# Patient Record
Sex: Male | Born: 1955
Health system: Southern US, Community
[De-identification: ages and names within clinical notes are randomized; demographics above are authoritative.]

## PROBLEM LIST (undated history)

## (undated) DIAGNOSIS — F319 Bipolar disorder, unspecified: Secondary | ICD-10-CM

## (undated) DIAGNOSIS — N400 Enlarged prostate without lower urinary tract symptoms: Secondary | ICD-10-CM

## (undated) DIAGNOSIS — I080 Rheumatic disorders of both mitral and aortic valves: Secondary | ICD-10-CM

## (undated) DIAGNOSIS — I2510 Atherosclerotic heart disease of native coronary artery without angina pectoris: Secondary | ICD-10-CM

## (undated) DIAGNOSIS — I219 Acute myocardial infarction, unspecified: Secondary | ICD-10-CM

## (undated) DIAGNOSIS — F32A Depression, unspecified: Secondary | ICD-10-CM

## (undated) DIAGNOSIS — I341 Nonrheumatic mitral (valve) prolapse: Secondary | ICD-10-CM

## (undated) DIAGNOSIS — E119 Type 2 diabetes mellitus without complications: Secondary | ICD-10-CM

## (undated) DIAGNOSIS — G4733 Obstructive sleep apnea (adult) (pediatric): Principal | ICD-10-CM

## (undated) DIAGNOSIS — F419 Anxiety disorder, unspecified: Secondary | ICD-10-CM

## (undated) DIAGNOSIS — G4730 Sleep apnea, unspecified: Secondary | ICD-10-CM

## (undated) DIAGNOSIS — S065X9A Traumatic subdural hemorrhage with loss of consciousness of unspecified duration, initial encounter: Secondary | ICD-10-CM

## (undated) DIAGNOSIS — E785 Hyperlipidemia, unspecified: Secondary | ICD-10-CM

## (undated) DIAGNOSIS — F329 Major depressive disorder, single episode, unspecified: Secondary | ICD-10-CM

## (undated) DIAGNOSIS — S065XAA Traumatic subdural hemorrhage with loss of consciousness status unknown, initial encounter: Secondary | ICD-10-CM

## (undated) HISTORY — DX: Depression, unspecified: F32.A

## (undated) HISTORY — PX: COLONOSCOPY: SHX174

## (undated) HISTORY — DX: Benign prostatic hyperplasia without lower urinary tract symptoms: N40.0

## (undated) HISTORY — DX: Acute myocardial infarction, unspecified: I21.9

## (undated) HISTORY — DX: Nonrheumatic mitral (valve) prolapse: I34.1

## (undated) HISTORY — DX: Obstructive sleep apnea (adult) (pediatric): G47.33

## (undated) HISTORY — DX: Anxiety disorder, unspecified: F41.9

## (undated) HISTORY — DX: Type 2 diabetes mellitus without complications: E11.9

## (undated) HISTORY — DX: Traumatic subdural hemorrhage with loss of consciousness status unknown, initial encounter: S06.5XAA

## (undated) HISTORY — DX: Rheumatic disorders of both mitral and aortic valves: I08.0

## (undated) HISTORY — DX: Hyperlipidemia, unspecified: E78.5

## (undated) HISTORY — DX: Bipolar disorder, unspecified: F31.9

## (undated) HISTORY — DX: Traumatic subdural hemorrhage with loss of consciousness of unspecified duration, initial encounter: S06.5X9A

## (undated) HISTORY — PX: POLYPECTOMY: SHX149

## (undated) HISTORY — DX: Major depressive disorder, single episode, unspecified: F32.9

## (undated) HISTORY — DX: Sleep apnea, unspecified: G47.30

---

## 2003-06-14 ENCOUNTER — Encounter: Payer: Self-pay | Admitting: Cardiology

## 2003-06-14 ENCOUNTER — Encounter: Payer: Self-pay | Admitting: *Deleted

## 2003-06-14 ENCOUNTER — Inpatient Hospital Stay (HOSPITAL_COMMUNITY): Admission: EM | Admit: 2003-06-14 | Discharge: 2003-06-17 | Payer: Self-pay | Admitting: *Deleted

## 2004-02-07 ENCOUNTER — Ambulatory Visit (HOSPITAL_COMMUNITY): Admission: RE | Admit: 2004-02-07 | Discharge: 2004-02-07 | Payer: Self-pay | Admitting: Gastroenterology

## 2005-01-10 ENCOUNTER — Ambulatory Visit: Payer: Self-pay | Admitting: Cardiology

## 2005-01-13 ENCOUNTER — Ambulatory Visit: Payer: Self-pay | Admitting: Cardiology

## 2005-02-27 ENCOUNTER — Ambulatory Visit: Payer: Self-pay | Admitting: Cardiology

## 2005-06-23 ENCOUNTER — Ambulatory Visit: Payer: Self-pay | Admitting: Cardiology

## 2005-10-27 ENCOUNTER — Ambulatory Visit: Payer: Self-pay | Admitting: Cardiology

## 2006-01-30 ENCOUNTER — Ambulatory Visit: Payer: Self-pay | Admitting: Cardiology

## 2006-02-04 ENCOUNTER — Ambulatory Visit: Payer: Self-pay | Admitting: Cardiology

## 2006-08-18 ENCOUNTER — Inpatient Hospital Stay (HOSPITAL_COMMUNITY): Admission: EM | Admit: 2006-08-18 | Discharge: 2006-08-19 | Payer: Self-pay | Admitting: Emergency Medicine

## 2006-08-18 ENCOUNTER — Ambulatory Visit: Payer: Self-pay | Admitting: Cardiology

## 2006-08-19 ENCOUNTER — Ambulatory Visit: Payer: Self-pay | Admitting: Cardiology

## 2006-08-19 ENCOUNTER — Ambulatory Visit: Payer: Self-pay

## 2006-09-10 ENCOUNTER — Ambulatory Visit: Payer: Self-pay | Admitting: Cardiology

## 2006-11-02 ENCOUNTER — Ambulatory Visit: Payer: Self-pay | Admitting: Cardiology

## 2007-04-21 ENCOUNTER — Ambulatory Visit: Payer: Self-pay | Admitting: Cardiology

## 2008-06-20 ENCOUNTER — Ambulatory Visit: Payer: Self-pay | Admitting: Cardiology

## 2008-06-20 LAB — CONVERTED CEMR LAB
AST: 31 units/L (ref 0–37)
Albumin: 4 g/dL (ref 3.5–5.2)
HDL: 32 mg/dL — ABNORMAL LOW (ref >39.0)
LDL Cholesterol: 85 mg/dL (ref 0–99)
Total Bilirubin: 0.8 mg/dL (ref 0.3–1.2)
Total CHOL/HDL Ratio: 4.3
Triglycerides: 112 mg/dL (ref 0–149)
VLDL: 22 mg/dL (ref 0–40)

## 2008-06-22 ENCOUNTER — Ambulatory Visit: Payer: Self-pay | Admitting: Cardiology

## 2009-07-13 ENCOUNTER — Encounter (INDEPENDENT_AMBULATORY_CARE_PROVIDER_SITE_OTHER): Payer: Self-pay | Admitting: *Deleted

## 2009-10-05 DIAGNOSIS — F17200 Nicotine dependence, unspecified, uncomplicated: Secondary | ICD-10-CM

## 2009-10-05 DIAGNOSIS — F319 Bipolar disorder, unspecified: Secondary | ICD-10-CM

## 2009-10-05 DIAGNOSIS — E7800 Pure hypercholesterolemia, unspecified: Secondary | ICD-10-CM

## 2009-10-05 DIAGNOSIS — I2510 Atherosclerotic heart disease of native coronary artery without angina pectoris: Secondary | ICD-10-CM

## 2009-10-05 DIAGNOSIS — I25118 Atherosclerotic heart disease of native coronary artery with other forms of angina pectoris: Secondary | ICD-10-CM | POA: Insufficient documentation

## 2009-10-05 DIAGNOSIS — F411 Generalized anxiety disorder: Secondary | ICD-10-CM | POA: Insufficient documentation

## 2009-10-05 HISTORY — DX: Atherosclerotic heart disease of native coronary artery without angina pectoris: I25.10

## 2009-10-05 HISTORY — DX: Bipolar disorder, unspecified: F31.9

## 2009-10-05 HISTORY — DX: Pure hypercholesterolemia, unspecified: E78.00

## 2009-10-10 ENCOUNTER — Ambulatory Visit: Payer: Self-pay | Admitting: Cardiology

## 2010-11-11 ENCOUNTER — Ambulatory Visit: Payer: Self-pay | Admitting: Cardiology

## 2010-11-14 ENCOUNTER — Telehealth (INDEPENDENT_AMBULATORY_CARE_PROVIDER_SITE_OTHER): Payer: Self-pay | Admitting: Radiology

## 2010-11-18 ENCOUNTER — Encounter: Payer: Self-pay | Admitting: *Deleted

## 2010-11-18 ENCOUNTER — Encounter (HOSPITAL_COMMUNITY)
Admission: RE | Admit: 2010-11-18 | Discharge: 2011-01-14 | Payer: Self-pay | Source: Home / Self Care | Attending: Cardiology | Admitting: Cardiology

## 2010-11-18 ENCOUNTER — Encounter: Payer: Self-pay | Admitting: Cardiovascular Disease

## 2010-11-18 ENCOUNTER — Ambulatory Visit: Payer: Self-pay | Admitting: Cardiology

## 2010-11-18 ENCOUNTER — Ambulatory Visit: Payer: Self-pay

## 2010-11-28 ENCOUNTER — Encounter: Payer: Self-pay | Admitting: Cardiology

## 2010-11-28 ENCOUNTER — Ambulatory Visit (HOSPITAL_COMMUNITY)
Admission: RE | Admit: 2010-11-28 | Discharge: 2010-11-28 | Payer: Self-pay | Source: Home / Self Care | Attending: Cardiology | Admitting: Cardiology

## 2010-11-28 ENCOUNTER — Ambulatory Visit: Payer: Self-pay

## 2010-12-03 ENCOUNTER — Ambulatory Visit: Payer: Self-pay | Admitting: Cardiology

## 2010-12-15 DIAGNOSIS — I219 Acute myocardial infarction, unspecified: Secondary | ICD-10-CM

## 2010-12-15 HISTORY — DX: Acute myocardial infarction, unspecified: I21.9

## 2011-01-06 ENCOUNTER — Ambulatory Visit
Admission: RE | Admit: 2011-01-06 | Discharge: 2011-01-06 | Payer: Self-pay | Source: Home / Self Care | Attending: Cardiology | Admitting: Cardiology

## 2011-01-06 ENCOUNTER — Other Ambulatory Visit: Payer: Self-pay | Admitting: Cardiology

## 2011-01-06 DIAGNOSIS — I059 Rheumatic mitral valve disease, unspecified: Secondary | ICD-10-CM

## 2011-01-06 HISTORY — DX: Rheumatic mitral valve disease, unspecified: I05.9

## 2011-01-06 LAB — HEPATIC FUNCTION PANEL
Alkaline Phosphatase: 36 U/L — ABNORMAL LOW (ref 39–117)
Bilirubin, Direct: 0.1 mg/dL (ref 0.0–0.3)
Total Bilirubin: 0.8 mg/dL (ref 0.3–1.2)

## 2011-01-06 LAB — LIPID PANEL
Cholesterol: 155 mg/dL (ref 0–200)
LDL Cholesterol: 92 mg/dL (ref 0–99)
Total CHOL/HDL Ratio: 4
VLDL: 21 mg/dL (ref 0.0–40.0)

## 2011-01-14 NOTE — Progress Notes (Signed)
Summary: nuc pre-procedure  Phone Note Outgoing Call   Call placed by: Harlow Asa CNMT Call placed to: Patient Reason for Call: Confirm/change Appt Summary of Call: Left message with information on Myoview Information Sheet (see scanned document for details).      Nuclear Med Background Indications for Stress Test: Evaluation for Ischemia, Stent Patency   History: Angioplasty, Heart Catheterization, Myocardial Perfusion Study, Stents  History Comments: '04 Cath/PTCA/Stents RCA ; '07 MPI Inf. scar /EF=52%  Symptoms: Chest Pain    Nuclear Pre-Procedure Cardiac Risk Factors: Family History - CAD, History of Smoking, Smoker Height (in): 69

## 2011-01-14 NOTE — Assessment & Plan Note (Signed)
Summary: f1y   Visit Type:  Follow-up  CC:  chest pain.  History of Present Illness: Had an episode of chest pain about one month ago.  Lasted about twenty minutes,and he had to lay down.  Took a nitro.  Had not had something like this since his MI.  Denies progressive chest pain since, and has been able to do most things.  Still smokes some.    Problems Prior to Update: 1)  Cad  (ICD-414.00) 2)  Hypercholesterolemia  (ICD-272.0) 3)  Bipolar Disorder Unspecified  (ICD-296.80) 4)  Anxiety Disorder  (ICD-300.00) 5)  Tobacco Abuse  (ICD-305.1)  Current Medications (verified): 1)  Lipitor 20 Mg Tabs (Atorvastatin Calcium) .... Take One Tablet By Mouth Daily. 2)  Metoprolol Tartrate 25 Mg Tabs (Metoprolol Tartrate) .... Take One Tablet By Mouth Twice A Day 3)  Bupropion Hcl 75 Mg Tabs (Bupropion Hcl) .... Take 3 Tablets Daily 4)  Aspirin 81 Mg Tbec (Aspirin) .... Take One Tablet By Mouth Daily 5)  Depakote 500 Mg Tbec (Divalproex Sodium) .... Take 4 Tablets A Day 6)  Lamictal 100 Mg Tabs (Lamotrigine) .... Take 1 Tablet By Mouth Once A Day 7)  Singulair 10 Mg Tabs (Montelukast Sodium) .... Take 1 Tablet By Mouth Once A Day 8)  Nitrostat 0.4 Mg Subl (Nitroglycerin) .[name].. 1 Tablet Under Tongue At Onset of Chest Pain; You May Repeat Every 5 Minutes For Up To 3 Doses. 9)  Tylenol Extra Strength 500 Mg Tabs (Acetaminophen) .... As Needed  Allergies (verified): No Known Drug Allergies  Past History:  Past Medical History: Last updated: 10/05/2009 Current Problems:  CAD (ICD-414.00) HYPERCHOLESTEROLEMIA (ICD-272.0) BIPOLAR DISORDER UNSPECIFIED (ICD-296.80) ANXIETY DISORDER (ICD-300.00) TOBACCO ABUSE (ICD-305.1)  Past Surgical History: Last updated: 10/05/2009  successful implantation of a  Cypher stent setting for an acute myocardial infarction.  Family History: Last updated: 10/05/2009   His mother is alive with coronary artery disease status  post several MIs.  Father passed  away in his 37s of an MI.  He has one brother who is alive status post 3-vessel CABG, one brother who passed away at the age of 36 of an acute MI.  Social History: Last updated: 10/05/2009  The patient is married.  He works at [employer].  He  smokes 2 cigarettes per day, former heavy tobacco user.  He drinks alcohol occasionally.  He denies any illicit drug use.  He exercises regularly.  Vital Signs:  Patient profile:   55 year old male Height:      69 inches Weight:      164 pounds BMI:     24.31 Pulse rate:   64 / minute Pulse rhythm:   regular BP sitting:   102 / 80  (left arm)  Vitals Entered By: Jacquelin Hawking, CMA (November 11, 2010 12:11 PM)  Physical Exam  General:  Well developed, well nourished, in no acute distress. Head:  normocephalic and atraumatic Eyes:  PERRLA/EOM intact; conjunctiva and lids normal. Lungs:  Clear bilaterally to auscultation and percussion. Heart:  PMI non displaced.  Normal S1 and S2.  No murmur, rub or gallop.   Msk:  Back normal, normal gait. Muscle strength and tone normal. Pulses:  pulses normal in all 4 extremities Extremities:  No clubbing or cyanosis. Neurologic:  Alert and oriented x 3.   EKG  Procedure date:  11/11/2010  Findings:      NSR.  WNL.  Cardiac Cath  Procedure date:  06/18/2003  Findings:  ANGIOGRAPHIC DATA:  1. Left main coronary artery was free of critical disease.  2. The LAD coursed to the apex.  There is mild luminal irregularity     including mild irregularity of the diagonal.  No high grade areas of     stenosis were noted.  There was faint collateralization of the distal     right coronary circulation.  3. The circumflex provided two major marginal branches.  The first marginal     branch had a segmental plaquing of about 50-60% in the proximal to mid     portion.  The second marginal branch was without critical disease.  4. The right coronary artery was a very large caliber vessel.  There  was a     90% focal stenosis in the mid vessel with some segmental plaquing     proximally.  More distally, especially seen in the RAO view was about a     60-70% area of narrowing prior to the distal vessel.  Following stenting     all of this was reduced to less than 10% residual luminal narrowing.     There was mild luminal irregularity in the distal vessel.  The PDA and     posterolateral system were free of critical disease.   VENTRICULOGRAPHY:  Ventriculography was performed in the RAO projection.  Ejection fraction was approximately 55% with an inferior wall area of  hypokinesis.  There also appears to be mild mitral regurgitation, although  this is difficult to gauge because most of those were in post PVC beats.   Impression & Recommendations:  Problem # 1:  CAD (ICD-414.00)  Had recent episode of pain.  Not exertional, and not recurrent. Had residual disease, so needs further workup.  Will do stress nuclear study to assess, and renew NTG prescription.  ECG is normal.  His updated medication list for this problem includes:    Metoprolol Tartrate 25 Mg Tabs (Metoprolol tartrate) .[name]... Take one tablet by mouth twice a day    Aspirin 81 Mg Tbec (Aspirin) .[name]... Take one tablet by mouth daily    Nitrostat 0.4 Mg Subl (Nitroglycerin) .[name]... 1 tablet under tongue at onset of chest pain; you may repeat every 5 minutes for up to 3 doses.  Orders: EKG w/ Interpretation (93000) Nuclear Stress Test (Nuc Stress Test)  Problem # 2:  HYPERCHOLESTEROLEMIA (ICD-272.0)  on LLT. His updated medication list for this problem includes:    Lipitor 20 Mg Tabs (Atorvastatin calcium) .[name]... Take one tablet by mouth daily.  Orders: EKG w/ Interpretation (93000) Nuclear Stress Test (Nuc Stress Test)  Problem # 3:  ANXIETY DISORDER (ICD-300.00) slightly anxious about this.  Inhibits ability to dc smoking.   Patient Instructions: 1)  Your physician recommends that you schedule a follow-up  appointment in: 1 WEEK 2)  Your physician recommends that you continue on your current medications as directed. Please refer to the Current Medication list given to you today. 3)  Your physician has requested that you have an exercise stress myoview in 1 WEEK.  For further information please visit [email or web address]  Please follow instruction sheet, as given. Prescriptions: NITROSTAT 0.4 MG SUBL (NITROGLYCERIN) 1 tablet under tongue at onset of chest pain; you may repeat every 5 minutes for up to 3 doses.  #25 x 2   Entered by:   Julieta Gutting, RN, BSN   Authorized by:   Ronaldo Miyamoto, MD, [facility]   Signed by:   Julieta Gutting, RN, BSN on 11/11/2010  Method used:   Electronically to        [facility] [phone]* (retail)       [address], [place]  [address]       Ph: [phone] or [phone]       Fax: [phone]   RxID:   [id number]

## 2011-01-16 NOTE — Assessment & Plan Note (Signed)
Summary: 6wk f/u    Visit Type:  Follow-up  CC:  No complaints.  History of Present Illness: Patient is in for follow up.  No chest pain at present.  Continues to do well at present time.    Problems Prior to Update: 1)  Cad  (ICD-414.00) 2)  Hypercholesterolemia  (ICD-272.0) 3)  Bipolar Disorder Unspecified  (ICD-296.80) 4)  Anxiety Disorder  (ICD-300.00) 5)  Tobacco Abuse  (ICD-305.1)  Current Medications (verified): 1)  Lipitor 20 Mg Tabs (Atorvastatin Calcium) .... Take One Tablet By Mouth Daily. 2)  Metoprolol Tartrate 25 Mg Tabs (Metoprolol Tartrate) .... Take One Tablet By Mouth Twice A Day 3)  Bupropion Hcl 75 Mg Tabs (Bupropion Hcl) .... Take 3 Tablets Daily 4)  Aspirin 81 Mg Tbec (Aspirin) .... Take One Tablet By Mouth Daily 5)  Depakote 500 Mg Tbec (Divalproex Sodium) .... Take 4 Tablets A Day 6)  Lamictal 100 Mg Tabs (Lamotrigine) .... Take 1 Tablet By Mouth Once A Day 7)  Singulair 10 Mg Tabs (Montelukast Sodium) .... Take 1 Tablet By Mouth Once A Day 8)  Nitrostat 0.4 Mg Subl (Nitroglycerin) .[name].. 1 Tablet Under Tongue At Onset of Chest Pain; You May Repeat Every 5 Minutes For Up To 3 Doses. 9)  Tylenol Extra Strength 500 Mg Tabs (Acetaminophen) .... As Needed  Allergies (verified): No Known Drug Allergies  Past History:  Past Medical History: Last updated: 10/05/2009 Current Problems:  CAD (ICD-414.00) HYPERCHOLESTEROLEMIA (ICD-272.0) BIPOLAR DISORDER UNSPECIFIED (ICD-296.80) ANXIETY DISORDER (ICD-300.00) TOBACCO ABUSE (ICD-305.1)  Past Surgical History: Last updated: 10/05/2009  successful implantation of a  Cypher stent setting for an acute myocardial infarction.  Family History: Last updated: 10/05/2009   His mother is alive with coronary artery disease status  post several MIs.  Father passed away in his 57s of an MI.  He has one brother who is alive status post 3-vessel CABG, one brother who passed away at the age of 75 of an acute MI.  Social  History: Last updated: 10/05/2009  The patient is married.  He works at [employer].  He  smokes 2 cigarettes per day, former heavy tobacco user.  He drinks alcohol occasionally.  He denies any illicit drug use.  He exercises regularly.  Vital Signs:  Patient profile:   56 year old male Height:      69 inches Weight:      163.50 pounds BMI:     24.23 Pulse rate:   72 / minute Pulse rhythm:   regular Resp:     18 per minute BP sitting:   108 / 80  (left arm) Cuff size:   large  Vitals Entered By: Vikki Ports (January 06, 2011 12:30 PM)  Physical Exam  General:  Well developed, well nourished, in no acute distress. Head:  normocephalic and atraumatic Eyes:  PERRLA/EOM intact; conjunctiva and lids normal. Lungs:  Clear bilaterally to auscultation and percussion. Heart:  PMI non displaced.  Normal S1 and S2.  No murmur heard. Extremities:  No clubbing or cyanosis. Neurologic:  Alert and oriented x 3.   Echocardiogram  Procedure date:  11/28/2010  Findings:      Study Conclusions            - Left ventricle: The cavity size was normal. Wall thickness was       increased in a pattern of mild LVH. Systolic function was normal.       The estimated ejection fraction was in the range of 55%  to 60%.       Wall motion was normal; there were no regional wall motion       abnormalities. Left ventricular diastolic function parameters were       normal.     - Mitral valve: Moderate regurgitation.  Impression & Recommendations:  Problem # 1:  CAD (ICD-414.00) stable.  No chest pain.  Stable at present. His updated medication list for this problem includes:    Metoprolol Tartrate 25 Mg Tabs (Metoprolol tartrate) .[name]... Take one tablet by mouth twice a day    Aspirin 81 Mg Tbec (Aspirin) .[name]... Take one tablet by mouth daily    Nitrostat 0.4 Mg Subl (Nitroglycerin) .[name]... 1 tablet under tongue at onset of chest pain; you may repeat every 5 minutes for up to 3  doses.  Orders: TLB-Lipid Panel (80061-LIPID) TLB-Hepatic/Liver Function Pnl (80076-HEPATIC)  Problem # 2:  MITRAL STENOSIS/ INSUFFICIENCY, NON-RHEUMATIC (ICD-424.0) Cannot hear a murmur.  Study reveiwed with patient, including images.  Suggest repeat study in 12-18 months unless change in murmur.  His updated medication list for this problem includes:    Metoprolol Tartrate 25 Mg Tabs (Metoprolol tartrate) .[name]... Take one tablet by mouth twice a day    Nitrostat 0.4 Mg Subl (Nitroglycerin) .[name]... 1 tablet under tongue at onset of chest pain; you may repeat every 5 minutes for up to 3 doses.  Problem # 3:  HYPERCHOLESTEROLEMIA (ICD-272.0) Will check lipid and liver today.   His updated medication list for this problem includes:    Lipitor 20 Mg Tabs (Atorvastatin calcium) .[name]... Take one tablet by mouth daily.  Orders: TLB-Lipid Panel (80061-LIPID) TLB-Hepatic/Liver Function Pnl (80076-HEPATIC)  Patient Instructions: 1)  Your physician recommends that you have a FASTING lipid and liver profile  today.  2)  Your physician recommends that you continue on your current medications as directed. Please refer to the Current Medication list given to you today. 3)  Your physician wants you to follow-up in:  4 MONTHS.  You will receive a reminder letter in the mail two months in advance. If you don't receive a letter, please call our office to schedule the follow-up appointment.

## 2011-01-16 NOTE — Assessment & Plan Note (Signed)
Summary: PER CHECK OUT/ STRESS @ 8AM/SAF   Visit Type:  follow-up stress test done today   History of Present Illness: Patient is here to review results of his exercise nuclear study.  This revealed inferior defect that is scar, but with reduced overall EF that is in the 35-40% range.  This is perhaps more than noted at cath during the acute visit.  He has not had symptoms since we saw him last few weeks.  He had good execise capacity and virtually no chest pain whatsoever.  There were not significant ST segment changes.   Problems Prior to Update: 1)  Cad  (ICD-414.00) 2)  Hypercholesterolemia  (ICD-272.0) 3)  Bipolar Disorder Unspecified  (ICD-296.80) 4)  Anxiety Disorder  (ICD-300.00) 5)  Tobacco Abuse  (ICD-305.1)  Current Medications (verified): 1)  Lipitor 20 Mg Tabs (Atorvastatin Calcium) .... Take One Tablet By Mouth Daily. 2)  Metoprolol Tartrate 25 Mg Tabs (Metoprolol Tartrate) .... Take One Tablet By Mouth Twice A Day 3)  Bupropion Hcl 75 Mg Tabs (Bupropion Hcl) .... Take 3 Tablets Daily 4)  Aspirin 81 Mg Tbec (Aspirin) .... Take One Tablet By Mouth Daily 5)  Depakote 500 Mg Tbec (Divalproex Sodium) .... Take 4 Tablets A Day 6)  Lamictal 100 Mg Tabs (Lamotrigine) .... Take 1 Tablet By Mouth Once A Day 7)  Singulair 10 Mg Tabs (Montelukast Sodium) .... Take 1 Tablet By Mouth Once A Day 8)  Nitrostat 0.4 Mg Subl (Nitroglycerin) .[name].. 1 Tablet Under Tongue At Onset of Chest Pain; You May Repeat Every 5 Minutes For Up To 3 Doses. 9)  Tylenol Extra Strength 500 Mg Tabs (Acetaminophen) .... As Needed  Allergies (verified): No Known Drug Allergies  Vital Signs:  Patient profile:   55 year old male Height:      69 inches Weight:      165.75 pounds BMI:     24.57 Pulse rate:   68 / minute Pulse rhythm:   regular Resp:     18 per minute BP sitting:   114 / 80  (left arm) Cuff size:   large  Vitals Entered By: Vikki Ports (November 18, 2010 2:50 PM)  Physical  Exam  General:  Well developed, well nourished, in no acute distress. Head:  normocephalic and atraumatic Eyes:  PERRLA/EOM intact; conjunctiva and lids normal. Lungs:  Clear bilaterally to auscultation and percussion. Heart:  Unchanged.  Very soft apical murmur.  Not heard last visit.  Extremities:  No clubbing or cyanosis. Neurologic:  Alert and oriented x 3.   Impression & Recommendations:  Problem # 1:  CAD (ICD-414.00) Nuclear scan suggest more severely reduced LV function than at time of cath.   Will await offical report and let patient know.   His updated medication list for this problem includes:    Metoprolol Tartrate 25 Mg Tabs (Metoprolol tartrate) .[name]... Take one tablet by mouth twice a day    Aspirin 81 Mg Tbec (Aspirin) .[name]... Take one tablet by mouth daily    Nitrostat 0.4 Mg Subl (Nitroglycerin) .[name]... 1 tablet under tongue at onset of chest pain; you may repeat every 5 minutes for up to 3 doses.  Problem # 2:  HYPERCHOLESTEROLEMIA (ICD-272.0) on lipid lowering therapy.  His updated medication list for this problem includes:    Lipitor 20 Mg Tabs (Atorvastatin calcium) .[name]... Take one tablet by mouth daily.  Problem # 3:  TOBACCO ABUSE (ICD-305.1) encouraged to quit.  Patient Instructions: 1)  Your physician recommends  that you schedule a follow-up appointment in: 6 weeks 2)  Your physician recommends that you continue on your current medications as directed. Please refer to the Current Medication list given to you today.

## 2011-01-16 NOTE — Assessment & Plan Note (Addendum)
Summary: Cardiology Nuclear Testing  Nuclear Med Background Indications for Stress Test: Evaluation for Ischemia, Stent Patency   History: Angioplasty, Heart Catheterization, Myocardial Perfusion Study, Stents  History Comments: '04 Cath/PTCA/Stents RCA ; '07 MPI Inf. scar /EF=52%  Symptoms: Chest Pain, Chest Tightness, Palpitations    Nuclear Pre-Procedure Cardiac Risk Factors: Family History - CAD, History of Smoking, Smoker Caffeine/Decaff Intake: none NPO After: 9:00 PM Lungs: clear IV 0.9% NS with Angio Cath: 22g     IV Site: R Hand IV Started by: Cathlyn Parsons, RN Chest Size (in) 38     Height (in): 69 Weight (lb): 163 BMI: 24.16 Tech Comments: Metoprolol held x 24hrs.  Nuclear Med Study 1 or 2 day study:  1 day     Stress Test Type:  Stress Reading MD:  Charlton Haws, MD     Referring MD:  T.Stuckey Resting Radionuclide:  Technetium 63m Tetrofosmin     Resting Radionuclide Dose:  11 mCi  Stress Radionuclide:  Technetium 78m Tetrofosmin     Stress Radionuclide Dose:  33 mCi   Stress Protocol Exercise Time (min):  9:00 min     Max HR:  151 bpm     Predicted Max HR:  168 bpm  Max Systolic BP: 143 mm Hg     Percent Max HR:  89.88 %     METS: 10.10 Rate Pressure Product:  62952    Stress Test Technologist:  Milana Na, EMT-P     Nuclear Technologist:  Domenic Polite, CNMT  Rest Procedure  Myocardial perfusion imaging was performed at rest 45 minutes following the intravenous administration of Technetium 35m Tetrofosmin.  Stress Procedure  The patient exercised for 9:00. The patient stopped due to fatigue, sob, and denied any chest pain.  There were no significant ST-T wave changes and rare pvcs.  Technetium 96m Tetrofosmin was injected at peak exercise and myocardial perfusion imaging was performed after a brief delay.  QPS Raw Data Images:  Normal; no motion artifact; normal heart/lung ratio. Stress Images:  There is decreased uptake in the inferior  wall Rest Images:  There is decreased uptake in the inferior wall. Subtraction (SDS):  There is a fixed defect that is most consistent with a previous infarction. Transient Ischemic Dilatation:  1.08  (Normal <1.22)  Lung/Heart Ratio:  0.22  (Normal <0.45)  Quantitative Gated Spect Images QGS EDV:  138 ml QGS ESV:  86 ml QGS EF:  37 % QGS cine images:  Depressed LV systolic function with inferior wall hypokinesis.  Findings Low risk nuclear study  Evidence for inferior infarct  Evidence for LV Dysfunction LV Dysfunction    Overall Impression  Exercise Capacity: Good exercise capacity. BP Response: Blunted  BP response Clinical Symptoms: No chest pain.  Dyspnea. ECG Impression: No significant ST segment change suggestive of ischemia. Overall Impression: Low risk stress nuclear study. Overall Impression Comments: Fixed inferior wall scar without significant reversible ischemia.  LV dysfunction is present and since last cardiolite in 2007 the EF has decreased from 52% to 37%.  Appended Document: Cardiology Nuclear Testing Patient called regarding results of study.  Fixed defect inferiorly with major finding of reduced LV compared to last study.  Patient is currently asymptomatic.  In general, would lean toward cathing if findings are real.  However, would get echo to assess LV function more accurately.  If also reduced, then cath patient.  Explained to patient in detail.  He understands. Will set up echo for next week.  TS  Appended  Document: Orders Update I spoke with the pt and scheduled him to have an echocardiogram on 11/28/10 at 1:00.   Clinical Lists Changes  Orders: Added new Referral order of Echocardiogram (Echo) - Signed

## 2011-02-17 ENCOUNTER — Telehealth (INDEPENDENT_AMBULATORY_CARE_PROVIDER_SITE_OTHER): Payer: Self-pay | Admitting: *Deleted

## 2011-02-25 NOTE — Progress Notes (Signed)
  DDS Request Received sent to [facility]  February 17, 2011 10:04 AM

## 2011-03-03 ENCOUNTER — Other Ambulatory Visit: Payer: Self-pay

## 2011-03-05 ENCOUNTER — Ambulatory Visit (INDEPENDENT_AMBULATORY_CARE_PROVIDER_SITE_OTHER): Payer: BC Managed Care – PPO | Admitting: *Deleted

## 2011-03-05 DIAGNOSIS — I059 Rheumatic mitral valve disease, unspecified: Secondary | ICD-10-CM

## 2011-03-05 DIAGNOSIS — E7800 Pure hypercholesterolemia, unspecified: Secondary | ICD-10-CM

## 2011-03-05 DIAGNOSIS — I2510 Atherosclerotic heart disease of native coronary artery without angina pectoris: Secondary | ICD-10-CM

## 2011-03-05 LAB — LIPID PANEL
Cholesterol: 148 mg/dL (ref 0–200)
HDL: 37 mg/dL — ABNORMAL LOW (ref >39.00)
LDL Cholesterol: 96 mg/dL (ref 0–99)
Triglycerides: 73 mg/dL (ref 0.0–149.0)
VLDL: 14.6 mg/dL (ref 0.0–40.0)

## 2011-03-05 LAB — HEPATIC FUNCTION PANEL
ALT: 20 U/L (ref 0–53)
Albumin: 4.2 g/dL (ref 3.5–5.2)
Bilirubin, Direct: 0.1 mg/dL (ref 0.0–0.3)
Total Protein: 6.4 g/dL (ref 6.0–8.3)

## 2011-03-18 ENCOUNTER — Telehealth: Payer: Self-pay | Admitting: Cardiology

## 2011-03-18 NOTE — Telephone Encounter (Signed)
Pt aware of lipid and liver results by phone.  

## 2011-04-29 NOTE — Assessment & Plan Note (Signed)
[facility]                            CARDIOLOGY OFFICE NOTE   LUCILE, DIDONATO                         MRN:          [id number]  DATE:06/22/2008                            DOB:          1956-03-27    Mr. Achille is in for followup.  In general, he is getting along  reasonably well.  He has no complaints.  He smokes about 2 cigarettes a  day.  The patient previously underwent successful implantation of a  Cypher stent setting for an acute myocardial infarction.  He had a good  angiographic result, overall.  He had a followup radionuclide imaging  study in September 2007.  At that time, there was an old small scar at  the base of the inferior wall with no evidence of ischemia.   CURRENT MEDICATIONS:  1. Bupropion 75 mg b.i.d.  2. Metoprolol 25 mg b.i.d.  3. Lipitor 20 mg at bedtime.  4. Aspirin 81 mg daily.  5. Depakote 500 mg 3 tablets daily.  6. Lamictal 100 mg daily.  7. Singulair 10 mg daily.   Recent laboratory studies done 2 days earlier revealed total cholesterol  139 with an LDL of 85 and HDL of 32.   On physical, he is a well-appearing gentleman in no acute distress.  Blood pressure is 114/71 and pulse is 59.  The weight is 169 pounds.  The lung fields are clear to auscultation and percussion.  The  extremities reveal no edema.   Electrocardiogram demonstrates sinus bradycardia.  There is a prominent  voltage.  There are no definite inferior Qs.   IMPRESSION:  1. Coronary artery disease, status post percutaneous coronary      intervention with a drug-eluting stent in 2004 for right coronary      artery obstruction.  2. Continued mild tobacco use.  3. Hypercholesterolemia.   PLAN:  1. Return to clinic in 1 year.  2. Continue Lipitor current dose.  3. I strongly encouraged discontinuation of smoking.    Arturo Morton. Riley Kill, MD, [facility]  Electronically Signed   TDS/MedQ  DD: 07/29/2008  DT: 07/30/2008  Job #: [id number]

## 2011-05-02 NOTE — Discharge Summary (Signed)
NAMEARYON, NHAM NO.:  [id number]   MEDICAL RECORD NO.:  [id number]                   PATIENT TYPE:  INP   LOCATION:  3734                                 FACILITY:  [facility]   PHYSICIAN:  Arturo Morton. Riley Kill, M.D.             DATE OF BIRTH:  [birth year]   DATE OF ADMISSION:  06/14/2003  DATE OF DISCHARGE:  06/17/2003                                 DISCHARGE SUMMARY   DISCHARGE DIAGNOSES:  1. Status post non-ST elevation myocardial infarction (inferior).     a. Treated with Cypher stent placement to the right coronary artery x 2     b. Ejection fraction 55%.  2. Dyslipidemia.  3. Tobacco abuse.  4. ACUITY trial.   PROCEDURES PERFORMED THIS ADMISSION:  1. Cardiac catheterization by Arturo Morton. Riley Kill, M.D., on June 15, 2003.  2. Percutaneous coronary intervention by Arturo Morton. Riley Kill, M.D., on June 15, 2003.  Please see his dictated note for complete details.  RCA with mid     90% and then 60% stenosis.  Status post Cypher stent placement x 2.     Reduction of stenosis to less than 10%.  Residual stenosis of 50-60% in     the first obtuse marginal.  EF 55% with inferior wall area of hypokinesis     and mild MR.   HISTORY OF PRESENT ILLNESS:  This 55 year old male was referred to the [facility] Emergency Room by his primary care physician, Dr. Dewaine Conger, on June 14, 2003.  He had had generalized fatigue and weakness for the past two to three  weeks.  He had also had recurrent chest pain and shortness of breath.  His  electrocardiogram was consistent with recent inferior wall myocardial  infarction.   HOSPITAL COURSE:  He was placed on anticoagulation per the ACUITY protocol.  He was also started on beta blockers, statin, and aspirin.  He went for  cardiac catheterization on June 15, 2003.  The results are noted above.  He  underwent successful Cypher stent placement x 2 to the RCA as noted above.  He tolerated the procedure well and had no  immediate complications.  A  smoking cessation consultant met with him to discuss smoking cessation.  He  continued to improve over the next couple of days.  Dr. Riley Kill noted that  there was significant spasm during the catheterization and recommended Imdur  50 mg daily and this was started at discharge.  Dr. Ladona Ridgel saw the patient  on June 17, 2003, and felt he was ready for discharge to home.  He will  follow up with Dr. Riley Kill or the P.A. in the next two weeks.   LABORATORY DATA:  White count 8300, hemoglobin 12.2, hematocrit 34.9,  platelet count 234,000.  The INR on admission was 1.0.  Sodium 139,  potassium 3.8, chloride 106, CO2 27, glucose 106, BUN  6, creatinine 0.7,  calcium 8.5, total bilirubin 0.6, alkaline phosphatase 81, AST 50, ALP 36,  total protein 7, albumin 3.5.  Peak troponin I of 3.97.  Peak CK-MB 29.1.  Total cholesterol 191, triglycerides 60, HDL 33, LDL 146.  C-reactive  protein 1.9.  TSH 1.65.  The chest x-ray on admission showed no evidence of  acute cardiopulmonary disease.   DISCHARGE MEDICATIONS:  1. Plavix 75 mg daily.  2. Coated aspirin 325 mg daily.  3. Lipitor 20 mg q.h.s.  4. Lopressor 25 mg b.i.d.  5. Nitroglycerin p.r.n. chest pain.  6. Imdur 30 mg one-half tablet daily.   PAIN MANAGEMENT:  1. Tylenol as needed.  2. Nitroglycerin as needed for chest pain.  The instructions for taking     nitroglycerin have been reviewed with the patient.  He is to call our     office or 911 with recurrent chest pain.   ACTIVITY:  No driving for two weeks.  No heavy lifting or exertion for two  weeks.  No work for three weeks.   DIET:  Low salt, low fat.   WOUND CARE:  The patient is to call our office for any groin swelling,  bleeding, or bruising.   SPECIAL INSTRUCTIONS:  The patient will be contacted through our research  team for follow up on the ACUITY trial.   FOLLOWUP:  The patient will see the P.A. for Dr. Riley Kill on Friday, June 30, 2003, at 12  p.m.  He may need to reschedule.  He will see Dr. Riley Kill in  three months and the office will call him with an appointment.   DISPOSITION:  The patient will need followup LFTs and lipid in six to eight  weeks.     Tereso Newcomer, P.A.                        Arturo Morton. Riley Kill, M.D.    SW/MEDQ  D:  06/17/2003  T:  06/17/2003  Job:  [id number]   cc:   Colon Flattery, MD  [address]  [place]  [place] [address]  Fax: [phone]    cc:   Colon Flattery, MD  [address]  [place]  [place] [address]  Fax: [phone]

## 2011-05-02 NOTE — Discharge Summary (Signed)
Derrick [name], Derrick [name]                ACCOUNT NO.:  [id number]   MEDICAL RECORD NO.:  [id number]          PATIENT TYPE:  INP   LOCATION:  3701                         FACILITY:  [facility]   PHYSICIAN:  Madolyn Frieze. Jens Som, MD, FACCDATE OF BIRTH:  Sep 26, 1956   DATE OF ADMISSION:  08/18/2006  DATE OF DISCHARGE:                                 DISCHARGE SUMMARY   PROCEDURES:  None.   TIME AT DISCHARGE:  28 minutes   PRIMARY DIAGNOSIS:  Chest pain.   SECONDARY DIAGNOSES:  1. Status post inferior myocardial infarction in June 2004 with a Cypher      stent x2 to the right coronary artery.  2. Residual coronary artery disease in the obtuse marginal one at 50-60%      at catheterization in 2004.  3. Preserved left ventricular function with an ejection fraction of 55% at      catheterization in 2004.  4. Status post exercise treadmill test in November 2006 without EKG      changes.  5. Hyperlipidemia.  6. Bipolar disorder.  7. Anxiety disorder  8. Allergy or intolerances:  None.  9. Ongoing tobacco use.  10.Family history of coronary artery disease.   HOSPITAL COURSE:  Derrick [name] is a 55 year old male with known coronary  artery disease.  He has been followed closely by Dr. Riley Kill since his MI in  2004.  He had chest pain for 1 week described as sharp and lasting about 30  seconds.  He was having multiple episodes on a daily basis.  There was no  association with exertion.  He came to the emergency room and was admitted  for further evaluation and treatment.   His cardiac enzymes were negative for MI.  His EKG was without acute  ischemic changes.  Dr. Jens Som evaluated Derrick [name] on August 19, 2006,  and felt he could be discharged with outpatient followup and stress testing  arranged.   LABORATORY VALUES:  Sodium 141, potassium 4.1, chloride 109, CO2 28, BUN 9,  creatinine 0.9, glucose 108.  Ammonia 46.  Hemoglobin 13.5, hematocrit 35.8,  WBC 6.7, platelets 164.  Serial CK-MB and  troponin I negative for MI.   DISCHARGE INSTRUCTIONS:  1. His activity level is to be increased gradually.  2. He is to come to our office for stress test today at 12:30 p.m.  3. He is not to eat or drink until that after the stress test.  4. He is to follow up with Dr. Riley Kill on September 10, 2006, at 9:30 a.m.  5. He is to follow up with Dr. Morrie Sheldon as needed.   DISCHARGE MEDICATIONS:  1. Lipitor 20 mg daily.  2. Metoprolol 25 mg b.i.d.  3. Zetia 10 mg daily.  4. Depakote 1500 mg daily.  5. Lamictal 100 mg daily.  6. Sublingual nitroglycerin p.r.n.  7. Xanax 0.5 mg p.r.n.  8. Wellbutrin 75 mg b.i.d.  9. Aspirin 81 mg daily.     ______________________________  Theodore Demark, [name]    ______________________________  Madolyn Frieze. Jens Som, MD, [facility]    RB/MEDQ  D:  08/19/2006  T:  08/19/2006  Job:  [id number]   cc:   Dr. Morrie Sheldon

## 2011-05-02 NOTE — H&P (Signed)
NAMELORRY, ANASTASI NO.:  [id number]   MEDICAL RECORD NO.:  [id number]          PATIENT TYPE:  INP   LOCATION:  1844                         FACILITY:  [facility]   PHYSICIAN:  Madolyn Frieze. Jens Som, MD, FACCDATE OF BIRTH:  March 02, 1956   DATE OF ADMISSION:  08/18/2006  DATE OF DISCHARGE:                                HISTORY & PHYSICAL   CARDIOLOGIST:  Arturo Morton. Riley Kill, MD, [facility]   CHIEF COMPLAINT:  Chest pain.   HISTORY OF PRESENT ILLNESS:  Mr. Steadham is a 55 year old male with a history  of coronary artery disease status post inferior MI in June 2004 with  resultant stenting of the RCA, hyperlipidemia, and bipolar disorder, who  presents with a 1-week history of chest pain.  The pain is precordial with  some radiation to the left jaw and arm.  It is described as a sharp pain  lasting about 30 seconds, and it occurs multiple times per day.  The pain is  occasionally associated with nausea, but he denies any shortness of breath,  palpitations, or diaphoresis.  He does complain of fatigue with exertion.  The chest pain occurs at rest or with exertion.  There is no pleuritic  component.  Rubbing the chest makes it better.  No exacerbating factors.  He  states he is under a lot of stress at work.  There is no association with  food.  He came to the ER for further evaluation.   PAST MEDICAL HISTORY:  1. Coronary artery disease status post inferior wall MI in June 2004 with      90% RCA, status post Cypher stent and some marginal disease 50-60%      occluded.  He had an exercise stress test in November 2006.  2. Hyperlipidemia.  3. Bipolar disorder.  4. Anxiety disorder.   MEDICATIONS:  1. Lipitor 20 mg p.o. daily.  2. Metoprolol 25 mg p.o. b.i.d.  3. Zetia 10 mg p.o. daily.  4. Depakote 1500 mg p.o. daily.  5. Lamictal 100 mg p.o. daily.  6. Nitroglycerin sublingual p.r.n. which he is out of.  7. P.R.N. medication for anxiety.   SOCIAL HISTORY:  The patient is  married.  He works at [employer].  He  smokes 2 cigarettes per day, former heavy tobacco user.  He drinks alcohol  occasionally.  He denies any illicit drug use.  He exercises regularly.   FAMILY HISTORY:  His mother is alive with coronary artery disease status  post several MIs.  Father passed away in his 19s of an MI.  He has one  brother who is alive status post 3-vessel CABG, one brother who passed away  at the age of 16 of an acute MI.   REVIEW OF SYSTEMS:  Positive for depression and anxiety as well as chest  pain and nausea.  He denies fever, chills, night sweats, headaches, vertigo,  dizziness, urinary or bowel complaints, blood from any orifice, vomiting,  diarrhea, abdominal pain, GERD symptoms, dyspnea on exertion, orthopnea,  PND, edema, palpitations, syncope, claudication, or pulmonary problems.   PHYSICAL EXAMINATION:  VITAL  SIGNS: Temperature 98.2 degrees, pulse 96,  respiratory rate 18, blood pressure 129/68.  Saturation 96% on room air.  GENERAL:  Awake, alert, oriented x3 in no acute distress.  Well dressed.  Well nourished.  HEENT:  Normocephalic and atraumatic.  Pupils equal, round, and reactive to  light.  Extraocular movements intact.  Sclerae clear.  Dentition was fair,  and he had moist mucous membranes.  NECK: Supple with no lymphadenopathy, no bruits, no JVD.  HEART: Regular rate and rhythm without no murmurs, rubs, or gallops  appreciated.  Normal PMI.  Pulses 2+ and equal.  LUNGS:  Clear to auscultation without wheezes, rales, or rhonchi.  ABDOMEN:  Soft, nontender, nondistended.  Bowel sounds present, and he had  no hepatosplenomegaly.  EXTREMITIES: No clubbing, cyanosis, or edema.  NEUROLOGIC:  Grossly intact.   LABORATORY DATA:  Hemoglobin 12.6, hematocrit 37.  Sodium 140, potassium  4.2, chloride 104, bicarb 29, BUN 11, glucose 107.  Point-of-care markers:  CK-MB less than 1, troponin less than 0.05, and myoglobin 38.3.   Chest x-ray: No active  cardiopulmonary disease.   EKG:  Rate was 70.  Rhythm was normal sinus.  Axis is normal.  Intervals: PR  150 msec, QRS 96 msec, QTC 404.  There were Q waves in the inferior leads.  There is no ST or T wave or ischemic changes noted and no hypertrophy.  No  change from previous EKG dated June 16, 2003.   ASSESSMENT:  Mr. Paragas is a 55 year old man with a history of coronary  artery disease, hyperlipidemia, and a strong family history of coronary  artery disease who presents with atypical chest pain.  There are no  electrocardiographic changes noted.  Point-of-care markers are negative x1.   We will admit to rule out and likely do a Myoview stress in the morning.   PROBLEMS:  1. Atypical chest pain.  2. Hyperlipidemia.  3. Bipolar disorder.  4. Anxiety disorder.   PLAN:  1. Admit to telemetry.  2. Cycle cardiac enzymes.  3. EKG in the morning.  4. Continue home medications.  5. N.P.O. after midnight for possible Myoview in the office tomorrow      morning, August 19, 2006.   This H&P Assessment and Plan was discussed with Dr. Jens Som.      Chauncey Reading, D.O.  Electronically Signed     ______________________________  Madolyn Frieze. Jens Som, MD, [facility]    EA/MEDQ  D:  08/18/2006  T:  08/18/2006  Job:  [id number]

## 2011-05-02 NOTE — Cardiovascular Report (Signed)
NAMEBREON, Derrick NO.:  [id number]   MEDICAL RECORD NO.:  [id number]                   PATIENT TYPE:  INP   LOCATION:  2912                                 FACILITY:  [facility]   PHYSICIAN:  Arturo Morton. Riley Kill, M.D.             DATE OF BIRTH:  [birth year]   DATE OF PROCEDURE:  DATE OF DISCHARGE:                              CARDIAC CATHETERIZATION   INDICATIONS:  The patient is a 55 year old gentleman who presented with  about a three week history of chest pain that has been off and on and rather  severe at times.  His EKG showed some inferior Qs with some mild ST  elevation with flipped T-waves in the inferior leads.  Enzymes were mildly  positive.  He was brought to the catheterization laboratory for further  evaluation.  The patient had been enrolled in the ACUITY trial.   PROCEDURES:  1. Left heart catheterization.  2. Selective coronary arteriography.  3. Selective left ventriculography.  4. PTCA and stenting of the right coronary artery.  5. Intravascular ultrasound.   DESCRIPTION OF PROCEDURE:  The patient was brought to the catheterization  laboratory and prepped and draped in the usual fashion.  Through an anterior  puncture the right femoral artery was easily entered.  The patient was on  the ACUITY protocol.  Views of the left and right coronary arteries were  obtained in multiple angiographic projections.  Ventriculography was  performed in the RAO projection.  I then spoke with the patient about two  high grade lesions in the right coronary artery.  I then also spoke with his  wife and our recommendation was the patient have percutaneous stenting of  these vessels.  We then proceeded on with percutaneous intervention.  Anticoagulation was done by the ACUITY protocol.  The patient received  glycoprotein inhibitors.  A JR4 guiding catheter with side holes was  utilized with a 7-French sheath.  A BMW wire was passed into the distal  vessel.  The initial lesion was initially pre dilated with a 2.5 mm  CrossSail balloon.  Following the pre dilatation a 33 mm x 3.0 stent was  placed in the distal portion of the mid vessel.  We clearly needed to treat  the area above the stent as well.  We then placed a 33 x 3.0 CYPHER stent in  the distal vessel and an overlapping 3.5 x 13 CYPHER more proximally.  The  distal stent was then dilated again with the 3.5 balloon.  We then upgraded  to a 4.0 PowerSail balloon and the distribution of the stent was post  dilated.  There was an area of aneurysmal dilatation just past the high  grade lesion site and by intravascular ultrasound there appeared to be some  mild incomplete apposition at this location so we then passed a 4.5 mm  balloon and took it up to this location.  All catheters were subsequently  removed and final shots obtained.  The patient tolerated the procedure well  without complications.   HEMODYNAMIC DATA:  1. Central aorta 103/67.  2. Left ventricle 110/11.  3. No gradient on pullback across the aortic valve.   ANGIOGRAPHIC DATA:  1. Left main coronary artery was free of critical disease.  2. The LAD coursed to the apex.  There is mild luminal irregularity     including mild irregularity of the diagonal.  No high grade areas of     stenosis were noted.  There was faint collateralization of the distal     right coronary circulation.  3. The circumflex provided two major marginal branches.  The first marginal     branch had a segmental plaquing of about 50-60% in the proximal to mid     portion.  The second marginal branch was without critical disease.  4. The right coronary artery was a very large caliber vessel.  There was a     90% focal stenosis in the mid vessel with some segmental plaquing     proximally.  More distally, especially seen in the RAO view was about a     60-70% area of narrowing prior to the distal vessel.  Following stenting     all of this was  reduced to less than 10% residual luminal narrowing.     There was mild luminal irregularity in the distal vessel.  The PDA and     posterolateral system were free of critical disease.   VENTRICULOGRAPHY:  Ventriculography was performed in the RAO projection.  Ejection fraction was approximately 55% with an inferior wall area of  hypokinesis.  There also appears to be mild mitral regurgitation, although  this is difficult to gauge because most of those were in post PVC beats.   INTRAVASCULAR ULTRASOUND:  Intravascular ultrasound demonstrated a fair  amount of plaque distally in the distal vessel.  A picture of this was taken  and given to the patient.  Coming proximally the stent appeared to be well  opposed except for a small area in the area of aneurysmal dilatation.  This  was then post dilated after the intravascular ultrasound was performed.  In  the most severe areas of disease there was about a 3 x 3 residual lumen even  after high pressure inflations.  This included the drug eluding stent area.  The proximal vessel was nearly up to 5-6 mm in size.  There appeared to be  good overlap.   CONCLUSIONS:  Successful percutaneous stenting of the right coronary artery  with overlapping CYPHER stents.   DISPOSITION:  The patient will need modification of lifestyle.  He will need  aspirin and Plavix for six months.  Follow-up will be with Arturo Morton.  Riley Kill, M.D.                                               Arturo Morton. Riley Kill, M.D.    TDS/MEDQ  D:  06/15/2003  T:  06/16/2003  Job:  [id number]  Colon Flattery, MD  [address]  [place]  [place] [address]  Fax: [phone]   [facility]   cc:   Colon Flattery, MD  [address]  [place]  [place] [address]  Fax: [phone]   [facility]

## 2011-05-02 NOTE — Letter (Signed)
Apr 21, 2007    Alfredia Client, M.D.  [address] [place], [place] [address]   RE:  RENWICK, [name]  MRN:  [id number]  /  DOB:  Apr 15, 1956   Dear Dr. Morrie Sheldon,   I had the pleasure of seeing Derrick [name] in the office today in  followup.  Cardiac-wise, he is doing well.  He denies any chest pain.  He is walking over 30 minutes a day.  He is not drinking any alcohol and  he is walking with his wife on a regular basis.   His medicines include:  1. Bupropion.  2. Metoprolol 25 mg p.o. b.i.d.  3. Lipitor 20 mg q.h.s.  4. Aspirin 81 mg daily.  5. Depakote daily.  6. Lamictal daily.  7. Singulair 10 daily.   PHYSICAL EXAMINATION:  He is alert, oriented, in no distress.  The blood  pressure is 110/80, the pulse is 68.  The lung fields are clear and the  cardiac rhythm is regular.  There is no significant murmur noted.   The electrocardiogram demonstrates normal sinus rhythm, is within normal  limits.   To summarize, this gentleman is doing well.  He denies any ongoing chest  pain.  I have instructed him to make contact with your office.  He needs  to have a lipid and liver profile.  In addition, while he was in the  hospital, an ammonia level was measured in September of 2007,  and was mildly elevated at 46 with the normal range of 11-35.  It would  probably be appropriate to repeat this.  I certainly leave this to your  discretion.  I am not quite sure why it was even ordered.   He is doing well.  We will see him back in followup in one year.    Sincerely,      Arturo Morton. Riley Kill, MD, [facility]    TDS/MedQ  DD: 04/21/2007  DT: 04/21/2007  Job #: [id number]

## 2011-05-02 NOTE — Assessment & Plan Note (Signed)
[facility]                            CARDIOLOGY OFFICE NOTE   RUSSEL, MORAIN                         MRN:          [id number]  DATE:11/02/2006                            DOB:          1956/06/19    Mr. Mcpeek is in for followup.  He remains stable.  He is not having any  ongoing chest pain, he still smokes a couple of cigarettes a day.  He is  going to try to quit smoking.   PHYSICAL EXAMINATION:  The blood pressure is 118/78, pulse is 55.  LUNGS:  Lung fields are clear.  CARDIAC:  Rhythm is regular.  The carotid upstrokes are brisk without  any bruits.  EXTREMITIES:  Reveal no edema.   EKG today reveals normal sinus rhythm.  There is mild diffuse ST  elevation compatible with early repolarization but no acute changes.   IMPRESSION:  1. Coronary disease - status post percutaneous coronary intervention      with drug eluting stent in 2004.  2. Continued mild tobacco use.  3. Hypercholesterolemia - lipid lowering therapy.  4. Normal liver function studies with mild elevated ammonia of unclear      etiology.   DISPOSITION:  1. We will repeat his lipid and liver profile.  2. He will return to clinic in 6 months.  3. He is scheduled to have followup with a psychiatrist.     Arturo Morton. Riley Kill, MD, [facility]  Electronically Signed    TDS/MedQ  DD: 11/02/2006  DT: 11/03/2006  Job #: [id number]

## 2011-05-02 NOTE — Assessment & Plan Note (Signed)
[facility]                            CARDIOLOGY OFFICE NOTE   Derrick, [name]                         MRN:          [id number]  DATE:04/21/2007                            DOB:          Aug 03, 1956    Derrick [name] is in for followup.   CANCELED DICTATION     Arturo Morton. Riley Kill, MD, [facility]     TDS/MedQ  DD: 04/21/2007  DT: 04/21/2007  Job #: [id number]

## 2011-05-02 NOTE — Assessment & Plan Note (Signed)
[facility]                              CARDIOLOGY OFFICE NOTE   ISSAAC, SHIPPER                         MRN:          [id number]  DATE:09/10/2006                            DOB:          November 23, 1956    Mr. Ang is in for followup.  He is doing well.  His symptoms have largely  abated.  Importantly, the patient is still smoking about two cigarettes a  day.  He still exercises and is eating well.  He denies any ongoing chest  pain.  The patient underwent radionuclide imaging.  This revealed a small  scar with no change from 2005.  His ejection fraction was 52%.  There is a  well defined inferior defect.   PHYSICAL EXAMINATION:  VITAL SIGNS:  Blood pressure 116/77, pulse 62.  NECK:  There are no carotid bruits.  LUNGS:  The lung fields are clear.  HEART:  There is no significant murmur noted.   IMPRESSION:  1. Coronary artery disease, status post percutaneous intervention with      drug-eluting stents in 2004.  2. Continued tobacco use.  3. Hypercholesterolemia, on lipid-lowering therapy.   PLAN:  1. Return to the clinic in six weeks for reassessment.  2. Call if any change.  3. Patient considers himself to be significantly better, and we will      continue to follow him closely.       Arturo Morton. Riley Kill, MD, [facility]     TDS/MedQ  DD:  09/10/2006  DT:  09/11/2006  Job #:  [id number]

## 2011-06-02 ENCOUNTER — Encounter: Payer: BC Managed Care – PPO | Admitting: Genetic Counselor

## 2011-06-20 ENCOUNTER — Telehealth: Payer: Self-pay | Admitting: Cardiology

## 2011-06-20 NOTE — Telephone Encounter (Signed)
Pt calling for samples of lisinopril and lipitor--advised we no longer carry samples of meds that are now generic--pt undestands--nt

## 2011-06-20 NOTE — Telephone Encounter (Signed)
Pt calling re samples of generic lipitor and metoprolol out of work and needs enough to last until the end of the month

## 2011-08-01 ENCOUNTER — Other Ambulatory Visit: Payer: Self-pay | Admitting: *Deleted

## 2011-08-01 MED ORDER — METOPROLOL TARTRATE 25 MG PO TABS: 25.0000 mg | ORAL_TABLET | Freq: Two times a day (BID) | ORAL | Status: DC

## 2011-08-01 NOTE — Telephone Encounter (Signed)
rx sent in today. Tanvi Gatling  

## 2011-08-05 ENCOUNTER — Other Ambulatory Visit: Payer: Self-pay

## 2011-08-05 MED ORDER — METOPROLOL TARTRATE 25 MG PO TABS: 25.0000 mg | ORAL_TABLET | Freq: Two times a day (BID) | ORAL | Status: DC

## 2012-02-24 ENCOUNTER — Other Ambulatory Visit: Payer: Self-pay

## 2012-02-24 MED ORDER — ATORVASTATIN CALCIUM 40 MG PO TABS: 40.0000 mg | ORAL_TABLET | Freq: Every day | ORAL | Status: DC

## 2012-04-13 ENCOUNTER — Other Ambulatory Visit: Payer: Self-pay | Admitting: Cardiology

## 2012-08-21 ENCOUNTER — Other Ambulatory Visit: Payer: Self-pay | Admitting: Cardiology

## 2012-08-24 ENCOUNTER — Other Ambulatory Visit: Payer: Self-pay | Admitting: Cardiology

## 2012-08-24 MED ORDER — METOPROLOL TARTRATE 25 MG PO TABS: 25.0000 mg | ORAL_TABLET | Freq: Two times a day (BID) | ORAL | Status: DC

## 2012-09-13 ENCOUNTER — Other Ambulatory Visit: Payer: Self-pay | Admitting: Cardiology

## 2012-09-15 ENCOUNTER — Encounter: Payer: Self-pay | Admitting: Cardiology

## 2012-09-20 ENCOUNTER — Ambulatory Visit (INDEPENDENT_AMBULATORY_CARE_PROVIDER_SITE_OTHER): Payer: BC Managed Care – PPO | Admitting: Cardiology

## 2012-09-20 VITALS — BP 127/81 | HR 79 | Ht 69.0 in | Wt 170.2 lb

## 2012-09-20 DIAGNOSIS — I2510 Atherosclerotic heart disease of native coronary artery without angina pectoris: Secondary | ICD-10-CM

## 2012-09-20 DIAGNOSIS — R0600 Dyspnea, unspecified: Secondary | ICD-10-CM

## 2012-09-20 DIAGNOSIS — R0989 Other specified symptoms and signs involving the circulatory and respiratory systems: Secondary | ICD-10-CM

## 2012-09-20 DIAGNOSIS — I059 Rheumatic mitral valve disease, unspecified: Secondary | ICD-10-CM

## 2012-09-20 DIAGNOSIS — F17200 Nicotine dependence, unspecified, uncomplicated: Secondary | ICD-10-CM

## 2012-09-20 DIAGNOSIS — R0602 Shortness of breath: Secondary | ICD-10-CM

## 2012-09-20 HISTORY — DX: Dyspnea, unspecified: R06.00

## 2012-09-20 NOTE — Assessment & Plan Note (Signed)
There is no obvious murmur on exam. I will probably repeat his echocardiogram again in the spring. This has not change in his exam I be reluctant to do another 2-D echocardiogram at this point in time.

## 2012-09-20 NOTE — Assessment & Plan Note (Signed)
The patient's main complaint is shortness of breath. He's not been coughing. It is related to exertion. His exam is unchanged, and I cannot appreciate a significant murmur on examination he does have some known moderate mitral regurgitation noted by previous echocardiography. However, there is been no change in murmur is no elevated neck veins. His lung fields are clear. Because he is a smoker we will get a chest x-ray. I will do an exercise stress test.

## 2012-09-20 NOTE — Assessment & Plan Note (Signed)
improved

## 2012-09-20 NOTE — Patient Instructions (Addendum)
Your physician has requested that you have an exercise tolerance test. For further information please visit [email or web address] Please also follow instruction sheet, as given.  A chest x-ray takes a picture of the organs and structures inside the chest, including the heart, lungs, and blood vessels. This test can show several things, including, whether the heart is enlarges; whether fluid is building up in the lungs; and whether pacemaker / defibrillator leads are still in place. PLEASE HAVE THIS PERFORMED AT THE [place] OFFICE ACROSS FROM [facility].   Your physician recommends that you continue on your current medications as directed. Please refer to the Current Medication list given to you today.

## 2012-09-20 NOTE — Progress Notes (Signed)
   HPI: The patient is been overall stable, but his chief complaints today are feeling sleepy much of the time, and he has some shortness of breath with exertion. He smokes one cigarette a day. He denies any chest pain. There has not been a major change in his cardiac medications, but due to his bipolar disorder he is on a number of medications. I have not seen him in almost two years.    Current Outpatient Prescriptions  Medication Sig Dispense Refill  . atorvastatin (LIPITOR) 40 MG tablet Take 1 tablet (40 mg total) by mouth daily.  30 tablet  6  . buPROPion (WELLBUTRIN XL) 150 MG 24 hr tablet Take 150 mg by mouth every morning.       . divalproex (DEPAKOTE ER) 500 MG 24 hr tablet Take 500 mg by mouth daily.       [name] escitalopram (LEXAPRO) 10 MG tablet Take 10 mg by mouth daily.       [name] ibuprofen (ADVIL,MOTRIN) 600 MG tablet Take 600 mg by mouth every 6 (six) hours as needed.       . lamoTRIgine (LAMICTAL) 200 MG tablet Take 200 mg by mouth daily.       . metoprolol tartrate (LOPRESSOR) 25 MG tablet Take 1 tablet (25 mg total) by mouth 2 (two) times daily.  30 tablet  0  . montelukast (SINGULAIR) 10 MG tablet Take 10 mg by mouth at bedtime.       . Tamsulosin HCl (FLOMAX) 0.4 MG CAPS 0.4 mg daily.         No Known Allergies  No past medical history on file.  No past surgical history on file.  No family history on file.  History   Social History  . Marital Status: Married    Spouse Name: N/A    Number of Children: N/A  . Years of Education: N/A   Occupational History  . Not on file.   Social History Main Topics  . Smoking status: Not on file  . Smokeless tobacco: Not on file  . Alcohol Use: Not on file  . Drug Use: Not on file  . Sexually Active: Not on file   Other Topics Concern  . Not on file   Social History Narrative  . No narrative on file    ROS: Please see the HPI.  All other systems reviewed and negative.  PHYSICAL EXAM:  There were no vitals taken for  this visit.  General: Well developed, well nourished, in no acute distress. Head:  Normocephalic and atraumatic. Neck: no JVD Lungs: Clear to auscultation and percussion. Heart: Normal S1 and S2.  No murmur, rubs or gallops. The patient was carefully examined.   Abdomen:  Normal bowel sounds; soft; non tender; no organomegaly Pulses: Pulses normal in all 4 extremities. Extremities: No clubbing or cyanosis. No edema. Neurologic: Alert and oriented x 3.  EKG:  NSR.  WNL.    ASSESSMENT AND PLAN:

## 2012-09-21 ENCOUNTER — Other Ambulatory Visit: Payer: Self-pay | Admitting: *Deleted

## 2012-09-21 MED ORDER — NITROGLYCERIN 0.4 MG SL SUBL: 0.4000 mg | SUBLINGUAL_TABLET | SUBLINGUAL | Status: DC | PRN

## 2012-09-24 ENCOUNTER — Other Ambulatory Visit: Payer: Self-pay | Admitting: Cardiology

## 2012-09-24 ENCOUNTER — Other Ambulatory Visit: Payer: Self-pay | Admitting: *Deleted

## 2012-09-24 MED ORDER — METOPROLOL TARTRATE 25 MG PO TABS: 25.0000 mg | ORAL_TABLET | Freq: Two times a day (BID) | ORAL | Status: DC

## 2012-09-24 NOTE — Telephone Encounter (Signed)
Refilled Metoprolol

## 2012-10-05 ENCOUNTER — Encounter: Payer: BC Managed Care – PPO | Admitting: Cardiology

## 2012-10-05 ENCOUNTER — Telehealth: Payer: Self-pay | Admitting: *Deleted

## 2012-10-05 NOTE — Telephone Encounter (Signed)
Patient canceled treadmill scheduled for 10/05/12 @ 10:30. Patient has a fracture bone in his foot.

## 2012-11-04 ENCOUNTER — Encounter: Payer: Self-pay | Admitting: Cardiology

## 2012-11-04 NOTE — Telephone Encounter (Signed)
plz return call to pt [phone] regarding changing GXT test to a PA before 12/14/13 to avoid deductible.

## 2012-11-04 NOTE — Telephone Encounter (Signed)
This encounter was created in error - please disregard.

## 2012-11-16 ENCOUNTER — Ambulatory Visit (INDEPENDENT_AMBULATORY_CARE_PROVIDER_SITE_OTHER): Payer: BC Managed Care – PPO | Admitting: Urology

## 2012-11-16 DIAGNOSIS — I861 Scrotal varices: Secondary | ICD-10-CM

## 2012-11-16 DIAGNOSIS — R339 Retention of urine, unspecified: Secondary | ICD-10-CM

## 2012-11-16 DIAGNOSIS — N400 Enlarged prostate without lower urinary tract symptoms: Secondary | ICD-10-CM

## 2012-11-16 DIAGNOSIS — N529 Male erectile dysfunction, unspecified: Secondary | ICD-10-CM

## 2012-12-02 ENCOUNTER — Telehealth: Payer: Self-pay

## 2012-12-02 ENCOUNTER — Ambulatory Visit (INDEPENDENT_AMBULATORY_CARE_PROVIDER_SITE_OTHER): Payer: BC Managed Care – PPO | Admitting: Physician Assistant

## 2012-12-02 ENCOUNTER — Other Ambulatory Visit (INDEPENDENT_AMBULATORY_CARE_PROVIDER_SITE_OTHER): Payer: BC Managed Care – PPO

## 2012-12-02 ENCOUNTER — Ambulatory Visit (INDEPENDENT_AMBULATORY_CARE_PROVIDER_SITE_OTHER)
Admission: RE | Admit: 2012-12-02 | Discharge: 2012-12-02 | Disposition: A | Discharge status: Home / Self Care | Payer: BC Managed Care – PPO | Source: Ambulatory Visit | Attending: Cardiology | Admitting: Cardiology

## 2012-12-02 ENCOUNTER — Other Ambulatory Visit: Payer: Self-pay

## 2012-12-02 DIAGNOSIS — R0602 Shortness of breath: Secondary | ICD-10-CM

## 2012-12-02 DIAGNOSIS — E7800 Pure hypercholesterolemia, unspecified: Secondary | ICD-10-CM

## 2012-12-02 DIAGNOSIS — I2510 Atherosclerotic heart disease of native coronary artery without angina pectoris: Secondary | ICD-10-CM

## 2012-12-02 LAB — HEPATIC FUNCTION PANEL
ALT: 40 U/L (ref 0–53)
AST: 34 U/L (ref 0–37)
Albumin: 4 g/dL (ref 3.5–5.2)
Alkaline Phosphatase: 46 U/L (ref 39–117)

## 2012-12-02 LAB — LIPID PANEL
Total CHOL/HDL Ratio: 4
Triglycerides: 92 mg/dL (ref 0.0–149.0)

## 2012-12-02 NOTE — Addendum Note (Signed)
Addended byAlben Spittle, Lorin Picket T on: 12/02/2012 01:49 PM   Modules accepted: Level of Service

## 2012-12-02 NOTE — Telephone Encounter (Signed)
Received a call from [facility] stating patient at their lab to have fasting lab and no order is in system.Lipid and hepatic panels ordered.

## 2012-12-02 NOTE — Progress Notes (Signed)
Exercise Treadmill Test  Pre-Exercise Testing Evaluation Rhythm: normal sinus  Rate: 76                 Test  Exercise Tolerance Test Ordering MD: Shawnie Pons, MD  Interpreting MD: Tereso Newcomer, [name]  Unique Test No: 1  Treadmill:  1  Indication for ETT: CAD  Contraindication to ETT: No   Stress Modality: exercise - treadmill  Cardiac Imaging Performed: non   Protocol: standard Bruce - maximal  Max BP:  180/86  Max MPHR (bpm):  166 85% MPR (bpm):  141  MPHR obtained (bpm):  166 % MPHR obtained:  100  Reached 85% MPHR (min:sec):  3:53 Total Exercise Time (min-sec):  7:01  Workload in METS:  8.2 Borg Scale: 17  Reason ETT Terminated:  fatigue    ST Segment Analysis At Rest: normal ST segments - no evidence of significant ST depression With Exercise: non-specific ST changes  Other Information Arrhythmia:  No Angina during ETT:  absent (0) Quality of ETT:  diagnostic  ETT Interpretation:  normal - no evidence of ischemia by ST analysis  Comments: Fair exercise tolerance. No chest pain.  He did c/o dyspnea.   Normal BP response to exercise. Non-specific ST-T changes.  No significant changes to suggest ischemia.   Recommendations: Follow up with Dr.  Shawnie Pons as directed. Signed,  Tereso Newcomer, [name]  1:48 PM 12/02/2012

## 2012-12-20 ENCOUNTER — Encounter: Payer: BC Managed Care – PPO | Admitting: Cardiology

## 2012-12-27 ENCOUNTER — Other Ambulatory Visit: Payer: Self-pay | Admitting: *Deleted

## 2012-12-27 MED ORDER — METOPROLOL TARTRATE 25 MG PO TABS: 25.0000 mg | ORAL_TABLET | Freq: Two times a day (BID) | ORAL | Status: DC

## 2013-02-03 ENCOUNTER — Encounter: Payer: Self-pay | Admitting: Neurology

## 2013-02-03 DIAGNOSIS — M545 Low back pain, unspecified: Secondary | ICD-10-CM

## 2013-02-03 DIAGNOSIS — G609 Hereditary and idiopathic neuropathy, unspecified: Secondary | ICD-10-CM

## 2013-02-03 DIAGNOSIS — R413 Other amnesia: Secondary | ICD-10-CM

## 2013-02-03 DIAGNOSIS — G250 Essential tremor: Secondary | ICD-10-CM

## 2013-02-03 DIAGNOSIS — G4733 Obstructive sleep apnea (adult) (pediatric): Secondary | ICD-10-CM

## 2013-02-03 DIAGNOSIS — G478 Other sleep disorders: Secondary | ICD-10-CM

## 2013-02-03 DIAGNOSIS — G252 Other specified forms of tremor: Secondary | ICD-10-CM

## 2013-02-05 ENCOUNTER — Encounter: Payer: Self-pay | Admitting: Neurology

## 2013-02-05 DIAGNOSIS — G4750 Parasomnia, unspecified: Secondary | ICD-10-CM

## 2013-02-05 DIAGNOSIS — G250 Essential tremor: Secondary | ICD-10-CM | POA: Insufficient documentation

## 2013-02-05 DIAGNOSIS — G609 Hereditary and idiopathic neuropathy, unspecified: Secondary | ICD-10-CM

## 2013-02-05 DIAGNOSIS — M545 Low back pain: Secondary | ICD-10-CM | POA: Insufficient documentation

## 2013-02-05 DIAGNOSIS — R413 Other amnesia: Secondary | ICD-10-CM

## 2013-02-05 DIAGNOSIS — G4733 Obstructive sleep apnea (adult) (pediatric): Secondary | ICD-10-CM

## 2013-02-05 HISTORY — DX: Essential tremor: G25.0

## 2013-02-05 HISTORY — DX: Parasomnia, unspecified: G47.50

## 2013-02-05 HISTORY — DX: Obstructive sleep apnea (adult) (pediatric): G47.33

## 2013-02-05 HISTORY — DX: Other amnesia: R41.3

## 2013-02-05 HISTORY — DX: Hereditary and idiopathic neuropathy, unspecified: G60.9

## 2013-03-01 ENCOUNTER — Telehealth: Payer: Self-pay | Admitting: Cardiology

## 2013-03-01 NOTE — Telephone Encounter (Signed)
Will forward to Dr Riley Kill and Leotis Shames RN, advised patient would be end of week before he received call back.

## 2013-03-01 NOTE — Telephone Encounter (Signed)
Pt cxl appt with stuckey, wants to know who dr Riley Kill  would recommend him start seeing?

## 2013-03-03 ENCOUNTER — Ambulatory Visit: Payer: Self-pay | Admitting: Neurology

## 2013-03-03 NOTE — Telephone Encounter (Signed)
I spoke with Dr Riley Kill and he said that if the pt cannot see him prior to his retirement then he would recommend that the pt arrange future follow-up with Dr Clifton James. I left a message for the pt to call back.

## 2013-03-04 NOTE — Telephone Encounter (Signed)
I spoke with the Derrick [name] and he would like to schedule an appointment to see Dr Riley Kill prior to his retirement.  Appointment arranged on 03/31/13.

## 2013-03-06 ENCOUNTER — Ambulatory Visit: Payer: BC Managed Care – PPO | Admitting: Neurology

## 2013-03-06 DIAGNOSIS — R0989 Other specified symptoms and signs involving the circulatory and respiratory systems: Secondary | ICD-10-CM

## 2013-03-06 DIAGNOSIS — G4710 Hypersomnia, unspecified: Secondary | ICD-10-CM

## 2013-03-06 DIAGNOSIS — R0609 Other forms of dyspnea: Secondary | ICD-10-CM

## 2013-03-07 ENCOUNTER — Encounter: Payer: Self-pay | Admitting: *Deleted

## 2013-03-07 ENCOUNTER — Ambulatory Visit: Payer: BC Managed Care – PPO | Admitting: Cardiology

## 2013-03-08 ENCOUNTER — Ambulatory Visit (INDEPENDENT_AMBULATORY_CARE_PROVIDER_SITE_OTHER): Payer: BC Managed Care – PPO | Admitting: Urology

## 2013-03-08 DIAGNOSIS — N529 Male erectile dysfunction, unspecified: Secondary | ICD-10-CM

## 2013-03-08 DIAGNOSIS — R339 Retention of urine, unspecified: Secondary | ICD-10-CM

## 2013-03-08 DIAGNOSIS — N400 Enlarged prostate without lower urinary tract symptoms: Secondary | ICD-10-CM

## 2013-03-11 ENCOUNTER — Other Ambulatory Visit: Payer: Self-pay | Admitting: *Deleted

## 2013-03-11 MED ORDER — ATORVASTATIN CALCIUM 40 MG PO TABS: 40.0000 mg | ORAL_TABLET | Freq: Every day | ORAL | Status: DC

## 2013-03-18 ENCOUNTER — Telehealth: Payer: Self-pay | Admitting: Neurology

## 2013-03-18 NOTE — Progress Notes (Signed)
Please call pt to notify him that the sleep study has been read and that there is evidence of obstructive sleep apnea. pls schedule appt for FU with me so we can go over test results and treatment plan. thx Pls copy report to Mr. Helene Kelp. thx

## 2013-03-22 ENCOUNTER — Encounter: Payer: Self-pay | Admitting: Neurology

## 2013-03-22 ENCOUNTER — Ambulatory Visit (INDEPENDENT_AMBULATORY_CARE_PROVIDER_SITE_OTHER): Payer: BC Managed Care – PPO | Admitting: Neurology

## 2013-03-22 VITALS — BP 124/84 | HR 80 | Temp 97.5°F | Ht 72.0 in | Wt 174.0 lb

## 2013-03-22 DIAGNOSIS — R413 Other amnesia: Secondary | ICD-10-CM

## 2013-03-22 DIAGNOSIS — G478 Other sleep disorders: Secondary | ICD-10-CM

## 2013-03-22 DIAGNOSIS — G4733 Obstructive sleep apnea (adult) (pediatric): Secondary | ICD-10-CM | POA: Insufficient documentation

## 2013-03-22 DIAGNOSIS — G250 Essential tremor: Secondary | ICD-10-CM

## 2013-03-22 HISTORY — DX: Obstructive sleep apnea (adult) (pediatric): G47.33

## 2013-03-22 NOTE — Patient Instructions (Signed)
We are going to schedule you for another sleep study for CPAP titration. I will see you back afterwards.

## 2013-03-22 NOTE — Progress Notes (Signed)
Subjective:    [name] ID: Derrick [name] is a 57 y.o. male.  HPI Interim history: Derrick [name] is a very pleasant 57 year old right-handed gentleman who presents for followup consultation of his tremors, neuropathy and after a recent sleep study. He is accompanied by his wife again today and I first met him on 02/03/2013 at which time he had evidence of peripheral neuropathy and bilateral upper extremity tremors which I felt could be drug induced from Derrick [name] long-standing. Given his history of loud snoring and significant daytime somnolence asked him to come back for sleep study. I went over his test results with him in detail today. He had a sleep study on 03/06/2013. His sleep efficiency was mildly reduced at 84.3% with a latency to sleep of 19.5 minutes. Wake after sleep onset was 52.5 minutes with mild sleep fragmentation noted. He had a normal percentage of stage I and stage II sleep, mural normal percentage of deep sleep and a normal percentage of REM sleep with a mildly reduced REM latency of 63.5 minutes. He did not have any significant period leg movements. He had mild to moderate and rare loud snoring. His AHI was 9.1 per hour rising to 9.8 per hour and REM sleep. His baseline oxygen saturation was 94% and his nadir was 85% during REM sleep. He was noted to have mild sleep talking and also some sleep related bruxism. I suggested additional workup with brain MRI, EMG and nerve conduction studies and blood work at Derrick time of his first visit. His MRI brain from 02/23/2013 was reported as normal without contrast. His EMG and nerve conduction study from 02/09/2013 showed normal nerve conduction studies and normal EMG. Blood work showed normal serum protein electrophoresis. Hemoglobin A1c was 5.8, rheumatoid factor was negative, vitamin B1 was within range, homocystine level was  within normal range, ANA was negative, RPR nonreactive, CRP was 0.2. He has no new complaints today and his wife reports  no new problems. She feels that his memory and his tremors have remained Derrick same. He is sleepy during Derrick day and often falls asleep inadvertently. In fact, he fell asleep in Derrick waiting room as well as Derrick exam room today, as he was waiting for me.  His Past Medical History Is Significant For: Past Medical History  Diagnosis Date  . Depression   . Coronary artery disease   . Anxiety   . Bipolar 1 disorder   . OSA (obstructive sleep apnea) 03/22/2013    His Past Surgical History Is Significant For: No past surgical history on file.  His Family History Is Significant For: No family history on file.  His Social History Is Significant For: History   Social History  . Marital Status: Married    Spouse Name: N/A    Number of Children: N/A  . Years of Education: N/A   Social History Main Topics  . Smoking status: Former Smoker    Types: Cigarettes    Quit date: 02/05/2003  . Smokeless tobacco: None  . Alcohol Use: No  . Drug Use: No  . Sexually Active: None   Other Topics Concern  . None   Social History Narrative  . None    His Allergies Are:  No Known Allergies:   His Current Medications Are:  Outpatient Encounter Prescriptions as of 03/22/2013  Medication Sig Dispense Refill  . acetaminophen (TYLENOL) 500 MG tablet Take 500 mg by mouth every 6 (six) hours as needed for pain.      [name]  aspirin 81 MG tablet Take 81 mg by mouth daily.      [name] atorvastatin (LIPITOR) 40 MG tablet Take 1 tablet (40 mg total) by mouth daily.  30 tablet  6  . buPROPion (WELLBUTRIN XL) 150 MG 24 hr tablet Take 150 mg by mouth every morning.       . divalproex ([name] ER) 500 MG 24 hr tablet Take 500 mg by mouth daily.       [name] escitalopram (LEXAPRO) 10 MG tablet Take 10 mg by mouth daily.       [name] ibuprofen (ADVIL,MOTRIN) 600 MG tablet Take 600 mg by mouth every 6 (six) hours as needed.       . lamoTRIgine (LAMICTAL) 200 MG tablet Take 200 mg by mouth daily.       . metoprolol tartrate  (LOPRESSOR) 25 MG tablet TAKE ONE TABLET BY MOUTH TWICE DAILY  60 tablet  2  . metoprolol tartrate (LOPRESSOR) 25 MG tablet Take 1 tablet (25 mg total) by mouth 2 (two) times daily.  60 tablet  3  . montelukast (SINGULAIR) 10 MG tablet Take 10 mg by mouth at bedtime.       . nitroGLYCERIN (NITROSTAT) 0.4 MG SL tablet Place 1 tablet (0.4 mg total) under Derrick tongue every 5 (five) minutes as needed for chest pain.  25 tablet  3  . Tamsulosin HCl (FLOMAX) 0.4 MG CAPS 0.4 mg daily.       [name] atorvastatin (LIPITOR) 40 MG tablet Take 1 tablet (40 mg total) by mouth daily.  30 tablet  6   No facility-administered encounter medications on file as of 03/22/2013.  :  Review of Systems  Constitutional: Positive for fatigue.  Respiratory: Positive for wheezing.        Snoring  Endocrine: Positive for polydipsia.  Genitourinary: Positive for difficulty urinating.  Neurological: Positive for weakness, numbness and headaches.       Memory loss, restless leg  Hematological: Bruises/bleeds easily.  Psychiatric/Behavioral: Positive for suicidal ideas, confusion, sleep disturbance (too much sleep) and dysphoric mood. Derrick [name] is nervous/anxious.        Racing thoughts    Objective:  Neurologic Exam  Physical Exam Physical Examination:   Filed Vitals:   03/22/13 1557  BP: 124/84  Pulse: 80  Temp: 97.5 F (36.4 C)    General Examination: Derrick [name] is a very pleasant 57 y.o. male in no acute distress. He is mildly anxious appearing.   HEENT exam: Normocephalic, atraumatic, pupils are equal, round and reactive to light and accommodation. Extraocular tracking is good without nystagmus. He has no lip, neck or jaw tremor. He has normal speech. He has normal facial animation with normal facial symmetry. Airway examination reveals a moderately tight airway secondary to thicker soft palate and mildly enlarged uvula. Tongue protrudes centrally and palate elevates symmetrically. Hearing is intact. Neck is  supple with full range of motion. His neck size is unchanged. His shoulder shrug is normal.  Chest is clear to auscultation without wheezing or rhonchi.  Heart sounds are normal without murmurs, rubs or gallops.  Abdomen is soft, nontender with normal bowel sounds. He has no pitting edema in Derrick distal lower extremities.  Skin is warm and dry without trophic changes.  He has no joint deformities.  Neurologically: Mental status: Derrick [name] is awake, alert and oriented in all 4 spheres. His memory, attention, language and knowledge are fair. He is relying on details of his history on his wife. He is answering  with some delay at times and is also not very elaborate with his history. All of this is unchanged from before. Cranial nerves are as described under HEENT exam. Motor exam: Normal bulk, strength and tone is noted. He has no drift and no resting tremor. He has a moderate degree of bilateral upper extremity postural and slightly milder action tremor. This is a relatively fast frequency low amplitude tremor. He has no lower extremity tremor but does have constant foot bobbing, also unchanged. He could stop this at my request but restarts after a few seconds. Reflexes are 2+ throughout. Romberg is negative. Cerebellar testing shows no dysmetria or intention tremor or truncal or gait ataxia. Finger taps, hand movements, rapid alternating movements, foot taps and foot agility are fairly well-preserved. Sensory exam is unchanged. Gait, station and balance are unremarkable. Tandem walk is unremarkable. Intact toe and heel stance is noted.               Assessment and Plan:   In summary, Derrick [name] is a 57 year old gentleman with a longer standing history of bilateral upper extremity tremors. He also has some memory loss and his exam shows signs of peripheral neuropathy as well as a postural and action tremor in both upper extremities.This tremor appears to be most likely a drug-induced tremor as can be seen  with [name] long-standing. He was also recently diagnosed with obstructive sleep apnea and explained his recent test results which included a brain MRI, EMG, nerve conduction studies, let work, and baseline sleep study to him and his wife in detail today. They have no further questions and I reassured him that his workup was essentially unremarkable with Derrick exception of obstructive sleep apnea and I do suggest that we pursue treatment for this. Given his high degree of sleepiness which does seem somewhat out of proportion to Derrick underlying degree of sleep apnea I do think we should at least try treatment with CPAP. I explained to her and treatment options to him and his wife including surgical options for OSA and he's willing to embark on treating his OSA with CPAP. To that end I have entered a request for a CPAP titration study and I will see him back afterwards. Hopefully he will do well again and I can prescribe Derrick CPAP unit for him to use at home and see him back after that. I answered all their questions and provided him with a copy of Derrick sleep study report. He and his wife were in agreement with Derrick plan. I did not suggest any new medications today for reasons outlined before. I encouraged them to call with any interim questions, concerns or problems her updates. His tremor and his memory have remained stable since I first met him.

## 2013-03-31 ENCOUNTER — Encounter: Payer: Self-pay | Admitting: Cardiology

## 2013-03-31 ENCOUNTER — Ambulatory Visit (INDEPENDENT_AMBULATORY_CARE_PROVIDER_SITE_OTHER): Payer: BC Managed Care – PPO | Admitting: Cardiology

## 2013-03-31 VITALS — BP 110/82 | HR 61 | Wt 173.0 lb

## 2013-03-31 DIAGNOSIS — E7800 Pure hypercholesterolemia, unspecified: Secondary | ICD-10-CM

## 2013-03-31 DIAGNOSIS — I059 Rheumatic mitral valve disease, unspecified: Secondary | ICD-10-CM

## 2013-03-31 DIAGNOSIS — I2510 Atherosclerotic heart disease of native coronary artery without angina pectoris: Secondary | ICD-10-CM

## 2013-03-31 DIAGNOSIS — F17200 Nicotine dependence, unspecified, uncomplicated: Secondary | ICD-10-CM

## 2013-03-31 NOTE — Patient Instructions (Addendum)
Your physician recommends that you schedule a follow-up appointment in: 4 MONTHS with Dr Shirlee Latch (previous pt of Dr Riley Kill)  Your physician has requested that you have an echocardiogram in 4 MONTHS. Echocardiography is a painless test that uses sound waves to create images of your heart. It provides your doctor with information about the size and shape of your heart and how well your heart's chambers and valves are working. This procedure takes approximately one hour. There are no restrictions for this procedure.  Your physician recommends that you continue on your current medications as directed. Please refer to the Current Medication list given to you today.

## 2013-03-31 NOTE — Progress Notes (Signed)
HPI:  Patient is in for follow up.  He lost his tooth when he ran into a door accidentally.  He is doing well cardiac wise.   Current Outpatient Prescriptions  Medication Sig Dispense Refill  . acetaminophen (TYLENOL) 500 MG tablet Take 500 mg by mouth every 6 (six) hours as needed for pain.      [name] aspirin 81 MG tablet Take 81 mg by mouth daily.      [name] atorvastatin (LIPITOR) 40 MG tablet Take 1 tablet (40 mg total) by mouth daily.  30 tablet  6  . buPROPion (WELLBUTRIN XL) 150 MG 24 hr tablet Take 150 mg by mouth every morning.       . divalproex (DEPAKOTE ER) 500 MG 24 hr tablet Take 500 mg by mouth daily.       [name] escitalopram (LEXAPRO) 10 MG tablet Take 10 mg by mouth daily.       [name] ibuprofen (ADVIL,MOTRIN) 600 MG tablet Take 600 mg by mouth every 6 (six) hours as needed.       . lamoTRIgine (LAMICTAL) 200 MG tablet Take 200 mg by mouth daily.       . metoprolol tartrate (LOPRESSOR) 25 MG tablet Take 1 tablet (25 mg total) by mouth 2 (two) times daily.  60 tablet  3  . montelukast (SINGULAIR) 10 MG tablet Take 10 mg by mouth at bedtime.       . nitroGLYCERIN (NITROSTAT) 0.4 MG SL tablet Place 1 tablet (0.4 mg total) under the tongue every 5 (five) minutes as needed for chest pain.  25 tablet  3  . Tamsulosin HCl (FLOMAX) 0.4 MG CAPS 0.4 mg daily.        No current facility-administered medications for this visit.    No Known Allergies  Past Medical History  Diagnosis Date  . Depression   . Coronary artery disease   . Anxiety   . Bipolar 1 disorder   . OSA (obstructive sleep apnea) 03/22/2013    No past surgical history on file.  No family history on file.  History   Social History  . Marital Status: Married    Spouse Name: N/A    Number of Children: N/A  . Years of Education: N/A   Occupational History  . Not on file.   Social History Main Topics  . Smoking status: Former Smoker    Types: Cigarettes    Quit date: 02/05/2003  . Smokeless tobacco: Not on file  .  Alcohol Use: No  . Drug Use: No  . Sexually Active: Not on file   Other Topics Concern  . Not on file   Social History Narrative  . No narrative on file    ROS: Please see the HPI.  All other systems reviewed and negative.  PHYSICAL EXAM:  BP 110/82  Pulse 61  Wt 173 lb (78.472 kg)  BMI 23.46 kg/m2  SpO2 97%  General: Well developed, well nourished, in no acute distress. Head:  Normocephalic and atraumatic. Neck: no JVD Lungs: Clear to auscultation and percussion. Heart: Normal S1 and S2.  No murmur, rubs or gallops.  Abdomen:  Normal bowel sounds; soft; non tender; no organomegaly Pulses: Pulses normal in all 4 extremities. Extremities: No clubbing or cyanosis. No edema. Neurologic: Alert and oriented x 3.  EKG:  NSR.  Inferior MI, old.  LVH.    ASSESSMENT AND PLAN:  1.  CAD  -  STEMI 2004 overlapping Cypher stents  On ASA 2.  Hyperlipidemia  - controlled.  3.  OSA  -  Stable.  4.  MR by echo 2011  -  FU Dr. Shirlee Latch and repeat echo at that time.

## 2013-04-01 NOTE — Assessment & Plan Note (Signed)
This is been a problem in the past, and he is been able to quit. He does have an underlying bipolar disorder. As such, we've not been  to aggressive on this particular issue

## 2013-04-01 NOTE — Assessment & Plan Note (Signed)
The patient remains on atorvastatin 40 mg. He has reasonable lipid control on this dose.

## 2013-04-01 NOTE — Assessment & Plan Note (Signed)
Patient continues to remain stable from a cardiac standpoint. He has overlapping Cypher stents in the right coronary artery remains on aspirin prophylaxis. He has no new cardiac symptoms, and continued observation is warranted

## 2013-04-01 NOTE — Assessment & Plan Note (Signed)
The patient had previously documented mitral regurgitation. However I cannot appreciate on exam at this time. Given his moderate findings in the past, it would be worthwhile to repeat an echocardiogram and he sees Dr.Mclean in followup.

## 2013-04-14 ENCOUNTER — Ambulatory Visit: Payer: Self-pay | Admitting: Nurse Practitioner

## 2013-04-23 ENCOUNTER — Other Ambulatory Visit: Payer: Self-pay | Admitting: Physician Assistant

## 2013-04-26 ENCOUNTER — Other Ambulatory Visit: Payer: Self-pay | Admitting: *Deleted

## 2013-04-26 MED ORDER — METOPROLOL TARTRATE 25 MG PO TABS: 25.0000 mg | ORAL_TABLET | Freq: Two times a day (BID) | ORAL | Status: DC

## 2013-07-25 ENCOUNTER — Other Ambulatory Visit (HOSPITAL_COMMUNITY): Payer: BC Managed Care – PPO

## 2013-08-07 ENCOUNTER — Other Ambulatory Visit: Payer: Self-pay | Admitting: Nurse Practitioner

## 2013-08-08 ENCOUNTER — Other Ambulatory Visit: Payer: Self-pay | Admitting: *Deleted

## 2013-08-08 MED ORDER — MONTELUKAST SODIUM 10 MG PO TABS: ORAL_TABLET | ORAL | Status: DC

## 2013-08-10 ENCOUNTER — Ambulatory Visit: Payer: BC Managed Care – PPO | Admitting: Cardiology

## 2013-10-10 ENCOUNTER — Other Ambulatory Visit: Payer: Self-pay | Admitting: Family Medicine

## 2013-10-12 ENCOUNTER — Other Ambulatory Visit: Payer: Self-pay | Admitting: *Deleted

## 2013-10-12 ENCOUNTER — Other Ambulatory Visit (HOSPITAL_COMMUNITY): Payer: Self-pay | Admitting: Cardiology

## 2013-10-12 ENCOUNTER — Ambulatory Visit (HOSPITAL_COMMUNITY): Payer: BC Managed Care – PPO | Attending: Cardiology

## 2013-10-12 DIAGNOSIS — Z87891 Personal history of nicotine dependence: Secondary | ICD-10-CM | POA: Insufficient documentation

## 2013-10-12 DIAGNOSIS — I059 Rheumatic mitral valve disease, unspecified: Secondary | ICD-10-CM

## 2013-10-12 DIAGNOSIS — I2510 Atherosclerotic heart disease of native coronary artery without angina pectoris: Secondary | ICD-10-CM | POA: Insufficient documentation

## 2013-10-12 DIAGNOSIS — G4730 Sleep apnea, unspecified: Secondary | ICD-10-CM | POA: Insufficient documentation

## 2013-10-12 MED ORDER — MONTELUKAST SODIUM 10 MG PO TABS: ORAL_TABLET | ORAL | Status: DC

## 2013-10-12 NOTE — Telephone Encounter (Signed)
LAST OV 01/05/13.

## 2013-10-12 NOTE — Progress Notes (Signed)
Echocardiogram performed.  

## 2013-10-18 ENCOUNTER — Ambulatory Visit (INDEPENDENT_AMBULATORY_CARE_PROVIDER_SITE_OTHER): Payer: BC Managed Care – PPO | Admitting: Cardiology

## 2013-10-18 ENCOUNTER — Encounter (INDEPENDENT_AMBULATORY_CARE_PROVIDER_SITE_OTHER): Payer: Self-pay

## 2013-10-18 ENCOUNTER — Encounter: Payer: Self-pay | Admitting: Cardiology

## 2013-10-18 VITALS — BP 102/76 | HR 77 | Ht 69.0 in | Wt 182.0 lb

## 2013-10-18 DIAGNOSIS — I2510 Atherosclerotic heart disease of native coronary artery without angina pectoris: Secondary | ICD-10-CM

## 2013-10-18 DIAGNOSIS — I059 Rheumatic mitral valve disease, unspecified: Secondary | ICD-10-CM

## 2013-10-18 DIAGNOSIS — R0600 Dyspnea, unspecified: Secondary | ICD-10-CM

## 2013-10-18 DIAGNOSIS — E7800 Pure hypercholesterolemia, unspecified: Secondary | ICD-10-CM

## 2013-10-18 DIAGNOSIS — R0609 Other forms of dyspnea: Secondary | ICD-10-CM

## 2013-10-18 LAB — BASIC METABOLIC PANEL
BUN: 15 mg/dL (ref 6–23)
Calcium: 9.4 mg/dL (ref 8.4–10.5)
Chloride: 103 mEq/L (ref 96–112)
Creatinine, Ser: 0.9 mg/dL (ref 0.4–1.5)
GFR: 97.85 mL/min (ref >60.00)
Sodium: 139 mEq/L (ref 135–145)

## 2013-10-18 LAB — LIPID PANEL
Cholesterol: 193 mg/dL (ref 0–200)
Total CHOL/HDL Ratio: 5
Triglycerides: 195 mg/dL — ABNORMAL HIGH (ref 0.0–149.0)
VLDL: 39 mg/dL (ref 0.0–40.0)

## 2013-10-18 NOTE — Progress Notes (Signed)
Patient ID: Derrick [name], male   DOB: November 30, 1956, 57 y.o.   MRN: [id number] PCP: Paulene Floor  57 yo with history of CAD s/p inferior STEMI with DES x 2 to RCA in 2004 and moderate MR with mitral valve prolapse presents for cardiology followup.  Patient has been seen by Dr. Riley Kill in the past and is seen by me for the first time today.  I reviewed all his old records.  He had an echo done last month showing moderate MR with mitral valve prolapse.  EF preserved.  His main complaint is exertional dyspnea.  He has no chest pain.  He will get short of breath with moderate activity such as weedeating or moving a piece of furniture.  He is short of breath walking up a hill but is ok walking on flat ground.  No orthopnea or PND.  The dyspnea has been present for several years but seems more marked to him over the last year.  He smokes 2 cigarettes/day (was a heavier smoker in the past).   ECG: NSR, normal  Labs (12/13): LDL 88, HDL 42  PMH: 1. Bipolar disorder 2. OSA 3. CAD: STEMI 2004 with 2 overlapping Cypher stents to RCA.  4. Hyperlipidemia 5. Mitral regurgitation: Echo (10/14) with EF 55-60%, mild LVH, MV prolapse with moderate MR.   SH: Married, on disability, smoking 2 cigarettes/day, lives in [place].  FH: Father with COPD  ROS: All systems reviewed and negative except as per HPI.   Current Outpatient Prescriptions  Medication Sig Dispense Refill  . acetaminophen (TYLENOL) 500 MG tablet Take 500 mg by mouth every 6 (six) hours as needed for pain.      [name] aspirin 81 MG tablet Take 81 mg by mouth daily.      [name] atorvastatin (LIPITOR) 40 MG tablet Take 1 tablet (40 mg total) by mouth daily.  30 tablet  6  . buPROPion (WELLBUTRIN XL) 150 MG 24 hr tablet Take 150 mg by mouth every morning.       . divalproex (DEPAKOTE ER) 500 MG 24 hr tablet Take 500 mg by mouth daily.       [name] escitalopram (LEXAPRO) 10 MG tablet Take 10 mg by mouth daily.       [name] ibuprofen (ADVIL,MOTRIN) 600 MG tablet Take  600 mg by mouth every 6 (six) hours as needed.       . lamoTRIgine (LAMICTAL) 200 MG tablet Take 200 mg by mouth daily.       . metoprolol tartrate (LOPRESSOR) 25 MG tablet Take 1 tablet (25 mg total) by mouth 2 (two) times daily.  60 tablet  6  . montelukast (SINGULAIR) 10 MG tablet TAKE ONE TABLET BY MOUTH ONE TIME DAILY  30 tablet  1  . nitroGLYCERIN (NITROSTAT) 0.4 MG SL tablet Place 1 tablet (0.4 mg total) under the tongue every 5 (five) minutes as needed for chest pain.  25 tablet  3  . Tamsulosin HCl (FLOMAX) 0.4 MG CAPS 0.4 mg daily.        No current facility-administered medications for this visit.   BP 102/76  Pulse 77  Ht 5\' 9"  (1.753 m)  Wt 82.555 kg (182 lb)  BMI 26.86 kg/m2 General: NAD Neck: No JVD, no thyromegaly or thyroid nodule.  Lungs: Clear to auscultation bilaterally with normal respiratory effort. CV: Nondisplaced PMI.  Heart regular S1/S2, no S3/S4, 2/6 HSM at apex.  No peripheral edema.  No carotid bruit.  Normal pedal pulses.  Abdomen:  Soft, nontender, no hepatosplenomegaly, no distention.  Skin: Intact without lesions or rashes.  Neurologic: Alert and oriented x 3.  Psych: Normal affect. Extremities: No clubbing or cyanosis.   Assessment/Plan: 1.  Exertional dyspnea: This is the patient's main complaint.  He is not volume overloaded or wheezing on exam.  He has a history of smoking, so COPD is a consideration.  His mitral regurgitation does not appear severe enough to cause significant dyspnea.  I would also be concerned that this could be an anginal equivalent given his known CAD.  - I will arrange for PFTs to assess for COPD . - I will get an ETT-Cardiolite to assess for obstructive disease.  - Check BNP.  2. CAD: No chest pain but exertional dyspnea as above.  Continue ASA 81, statin, metoprolol. 3. Smoking: I strongly encouraged him to quit.  He is already on Wellbutrin and is going to try an electronic cigarette.  4. Mitral regurgitation: Moderate, from  mitral valve prolapse.  I will repeat the echo in 1 year in 10/15.  If MR is stable, will repeat at 2-3 year intervals.  5. Hyperlipidemia: Goal LDL < 70 with CAD.  Continue statin, will check lipids.   Marca Ancona 10/18/2013

## 2013-10-18 NOTE — Patient Instructions (Signed)
Your physician recommends that you have a  lipid profile /BMET/BNP today / Your physician has requested that you have en exercise stress myoview. For further information please visit [email or web address] Please follow instruction sheet, as given.  Your physician has recommended that you have a pulmonary function test. Pulmonary Function Tests are a group of tests that measure how well air moves in and out of your lungs.  Your physician has requested that you have an echocardiogram. Echocardiography is a painless test that uses sound waves to create images of your heart. It provides your doctor with information about the size and shape of your heart and how well your heart's chambers and valves are working. This procedure takes approximately one hour. There are no restrictions for this procedure. IN 1 YEAR        A FEW DAYS BEFORE YOU SEE DR [facility] IN 1 YEAR  Your physician wants you to follow-up in: 1 year with Dr Shirlee Latch. (November 2015)  You will receive a reminder letter in the mail two months in advance. If you don't receive a letter, please call our office to schedule the follow-up appointment.

## 2013-10-21 ENCOUNTER — Ambulatory Visit: Payer: BC Managed Care – PPO | Admitting: Cardiology

## 2013-10-25 ENCOUNTER — Telehealth: Payer: Self-pay | Admitting: Cardiology

## 2013-10-25 DIAGNOSIS — E7800 Pure hypercholesterolemia, unspecified: Secondary | ICD-10-CM

## 2013-10-25 NOTE — Telephone Encounter (Signed)
Follow up    Pt returned call today. Would like a call back

## 2013-10-26 MED ORDER — ATORVASTATIN CALCIUM 80 MG PO TABS: 80.0000 mg | ORAL_TABLET | Freq: Every day | ORAL | Status: DC

## 2013-10-26 NOTE — Progress Notes (Signed)
Pt advised,verbalized understanding. 

## 2013-10-26 NOTE — Telephone Encounter (Signed)
Follow up    Pt returned your call about his med.    Please call him back

## 2013-10-26 NOTE — Telephone Encounter (Signed)
Spoke with patient about recent lab results 

## 2013-10-31 ENCOUNTER — Encounter: Payer: Self-pay | Admitting: Cardiovascular Disease

## 2013-11-03 ENCOUNTER — Encounter: Payer: Self-pay | Admitting: Cardiovascular Disease

## 2013-11-03 ENCOUNTER — Ambulatory Visit (HOSPITAL_COMMUNITY): Payer: BC Managed Care – PPO | Attending: Cardiovascular Disease | Admitting: Radiology

## 2013-11-03 VITALS — BP 121/88 | HR 74 | Ht 69.0 in | Wt 180.0 lb

## 2013-11-03 DIAGNOSIS — E785 Hyperlipidemia, unspecified: Secondary | ICD-10-CM | POA: Insufficient documentation

## 2013-11-03 DIAGNOSIS — R0602 Shortness of breath: Secondary | ICD-10-CM

## 2013-11-03 DIAGNOSIS — Z8249 Family history of ischemic heart disease and other diseases of the circulatory system: Secondary | ICD-10-CM | POA: Insufficient documentation

## 2013-11-03 DIAGNOSIS — I2510 Atherosclerotic heart disease of native coronary artery without angina pectoris: Secondary | ICD-10-CM | POA: Insufficient documentation

## 2013-11-03 DIAGNOSIS — Z9861 Coronary angioplasty status: Secondary | ICD-10-CM | POA: Insufficient documentation

## 2013-11-03 DIAGNOSIS — R0989 Other specified symptoms and signs involving the circulatory and respiratory systems: Secondary | ICD-10-CM | POA: Insufficient documentation

## 2013-11-03 DIAGNOSIS — I252 Old myocardial infarction: Secondary | ICD-10-CM | POA: Insufficient documentation

## 2013-11-03 DIAGNOSIS — R0600 Dyspnea, unspecified: Secondary | ICD-10-CM

## 2013-11-03 DIAGNOSIS — F17200 Nicotine dependence, unspecified, uncomplicated: Secondary | ICD-10-CM | POA: Insufficient documentation

## 2013-11-03 DIAGNOSIS — R0609 Other forms of dyspnea: Secondary | ICD-10-CM | POA: Insufficient documentation

## 2013-11-03 DIAGNOSIS — R42 Dizziness and giddiness: Secondary | ICD-10-CM | POA: Insufficient documentation

## 2013-11-03 DIAGNOSIS — R5381 Other malaise: Secondary | ICD-10-CM | POA: Insufficient documentation

## 2013-11-03 DIAGNOSIS — Z8673 Personal history of transient ischemic attack (TIA), and cerebral infarction without residual deficits: Secondary | ICD-10-CM | POA: Insufficient documentation

## 2013-11-03 MED ORDER — TECHNETIUM TC 99M SESTAMIBI GENERIC - CARDIOLITE
10.0000 | Freq: Once | INTRAVENOUS | Status: AC | PRN
  Administered 2013-11-03: 10 via INTRAVENOUS

## 2013-11-03 MED ORDER — TECHNETIUM TC 99M SESTAMIBI GENERIC - CARDIOLITE
30.0000 | Freq: Once | INTRAVENOUS | Status: AC | PRN
  Administered 2013-11-03: 30 via INTRAVENOUS

## 2013-11-03 NOTE — Progress Notes (Addendum)
[facility] SITE 3 NUCLEAR MED [address] [place], [place] [address] [phone]    Cardiology Nuclear Med Study  Derrick [name] is a 57 y.o. male     MRN : [id number]     DOB: 06/16/1956  Procedure Date: 11/03/2013  Nuclear Med Background Indication for Stress Test:  Evaluation for Ischemia and Stent Patency History:  CAD, MI, Stent RCA, PTCA RCA, Echo EF 55-60%, MPI Scar  EF 37% Cardiac Risk Factors: CVA, Family History - CAD, Lipids and Smoker  Symptoms:  Dizziness, DOE and Fatigue   Nuclear Pre-Procedure Caffeine/Decaff Intake:  None NPO After: 4:00am   Lungs:  clear O2 Sat: 94% on room air. IV 0.9% NS with Angio Cath:  22g  IV Site: R Hand  IV Started by:  Cathlyn Parsons, RN  Chest Size (in):  42 Cup Size: n/a  Height: 5\' 9"  (1.753 m)  Weight:  180 lb (81.647 kg)  BMI:  Body mass index is 26.57 kg/(m^2). Tech Comments:  No Lopressor x 36 hrs    Nuclear Med Study 1 or 2 day study: 1 day  Stress Test Type:  Stress  Reading MD: Charlton Haws, MD  Order Authorizing Provider:  Fransico Meadow  Resting Radionuclide: Technetium 49m Sestamibi  Resting Radionuclide Dose: 11.0 mCi   Stress Radionuclide:  Technetium 25m Sestamibi  Stress Radionuclide Dose: 32.8 mCi           Stress Protocol Rest HR: 74 Stress HR: 150  Rest BP: 121/88 Stress BP: 155/82  Exercise Time (min): 9:00 METS: 10.1   Predicted Max HR: 165 bpm % Max HR: 90.91 bpm Rate Pressure Product: 38756   Dose of Adenosine (mg):  n/a Dose of Lexiscan: n/a mg  Dose of Atropine (mg): n/a Dose of Dobutamine: n/a mcg/kg/min (at max HR)  Stress Test Technologist: Nelson Chimes, BS-ES  Nuclear Technologist:  Domenic Polite, CNMT     Rest Procedure:  Myocardial perfusion imaging was performed at rest 45 minutes following the intravenous administration of Technetium 45m Sestamibi. Rest ECG: NSR - Normal EKG  Stress Procedure:  The patient exercised on the treadmill utilizing the Bruce Protocol  for 9:00 minutes. The patient stopped due to SOB and denied any chest pain.  Technetium 88m Sestamibi was injected at peak exercise and myocardial perfusion imaging was performed after a brief delay. Patients symptoms resolved in recovery.  Stress ECG: No significant change from baseline ECG  QPS Raw Data Images:  Normal; no motion artifact; normal heart/lung ratio. Stress Images:  There is decreased uptake in the inferior wall. Rest Images:  There is decreased uptake in the inferior wall. Subtraction (SDS):  There is a fixed defect that is most consistent with a previous infarction. Transient Ischemic Dilatation (Normal <1.22):  0.83 Lung/Heart Ratio (Normal <0.45):  0.30  Quantitative Gated Spect Images QGS EDV:  106 ml QGS ESV:  55 ml  Impression Exercise Capacity:  Good exercise capacity. BP Response:  Normal blood pressure response. Clinical Symptoms:  No significant symptoms noted. ECG Impression:  No significant ST segment change suggestive of ischemia. Comparison with Prior Nuclear Study: No images to compare  Overall Impression:  Low risk stress nuclear study Small inferolateral wall infarct at mid and basal level with no ischemia.  LV Ejection Fraction: 48%.  LV Wall Motion:  Mildly decreased LV function with inferior lateral wall hypokinesis  Charlton Haws  Overall low risk study.  Mildly decreased EF.  Small area of infarction with no ischemia.  Please inform patient.   Marca Ancona 11/04/2013

## 2013-11-04 NOTE — Progress Notes (Signed)
Lmtcb.

## 2013-11-07 ENCOUNTER — Telehealth: Payer: Self-pay | Admitting: Cardiology

## 2013-11-07 NOTE — Telephone Encounter (Signed)
Follow Up  Returning call for results

## 2013-11-07 NOTE — Progress Notes (Signed)
LMTCB

## 2013-11-07 NOTE — Progress Notes (Signed)
Pt.notified

## 2013-11-07 NOTE — Telephone Encounter (Signed)
Follow up ° ° ° °Pt returning your call for his results.   °

## 2013-11-07 NOTE — Telephone Encounter (Signed)
LMTCB

## 2013-11-07 NOTE — Telephone Encounter (Signed)
Spoke with patient about recent myoview results. 

## 2013-11-14 ENCOUNTER — Ambulatory Visit (INDEPENDENT_AMBULATORY_CARE_PROVIDER_SITE_OTHER): Payer: BC Managed Care – PPO | Admitting: Internal Medicine

## 2013-11-14 DIAGNOSIS — R0600 Dyspnea, unspecified: Secondary | ICD-10-CM

## 2013-11-14 DIAGNOSIS — I2510 Atherosclerotic heart disease of native coronary artery without angina pectoris: Secondary | ICD-10-CM

## 2013-11-14 NOTE — Progress Notes (Signed)
PFT done today. 

## 2013-11-18 LAB — PULMONARY FUNCTION TEST
DL/VA % pred: 72 %
FEF 25-75 Post: 2.76 L/sec
FEF2575-%Change-Post: 26 %
FEV1-%Change-Post: 7 %
FEV1-%Pred-Post: 86 %
FEV1-%Pred-Pre: 80 %
FEV1-Post: 3.35 L
FEV1-Pre: 3.12 L
FEV1FVC-%Change-Post: 6 %
FEV6-%Pred-Post: 89 %
FEV6-Pre: 4.33 L
FEV6FVC-%Change-Post: 0 %
FEV6FVC-%Pred-Pre: 103 %
FVC-%Change-Post: 0 %
FVC-Post: 4.45 L
Post FEV1/FVC ratio: 75 %
Post FEV6/FVC ratio: 100 %
Pre FEV1/FVC ratio: 71 %
Pre FEV6/FVC Ratio: 99 %
RV % pred: 99 %
RV: 2.19 L
TLC % pred: 97 %
TLC: 7.02 L

## 2013-12-03 ENCOUNTER — Other Ambulatory Visit: Payer: Self-pay | Admitting: Cardiology

## 2013-12-12 ENCOUNTER — Ambulatory Visit (INDEPENDENT_AMBULATORY_CARE_PROVIDER_SITE_OTHER): Payer: BC Managed Care – PPO | Admitting: *Deleted

## 2013-12-12 DIAGNOSIS — Z23 Encounter for immunization: Secondary | ICD-10-CM

## 2013-12-18 ENCOUNTER — Other Ambulatory Visit: Payer: Self-pay | Admitting: Nurse Practitioner

## 2013-12-28 ENCOUNTER — Other Ambulatory Visit: Payer: BC Managed Care – PPO

## 2014-01-04 ENCOUNTER — Encounter: Payer: Self-pay | Admitting: Family Medicine

## 2014-01-04 ENCOUNTER — Encounter (INDEPENDENT_AMBULATORY_CARE_PROVIDER_SITE_OTHER): Payer: Self-pay

## 2014-01-04 ENCOUNTER — Ambulatory Visit (INDEPENDENT_AMBULATORY_CARE_PROVIDER_SITE_OTHER): Payer: BC Managed Care – PPO | Admitting: Family Medicine

## 2014-01-04 VITALS — BP 120/81 | HR 92 | Temp 99.5°F | Ht 69.0 in | Wt 186.0 lb

## 2014-01-04 DIAGNOSIS — R062 Wheezing: Secondary | ICD-10-CM

## 2014-01-04 DIAGNOSIS — J069 Acute upper respiratory infection, unspecified: Secondary | ICD-10-CM

## 2014-01-04 DIAGNOSIS — J029 Acute pharyngitis, unspecified: Secondary | ICD-10-CM

## 2014-01-04 LAB — POCT INFLUENZA A/B
Influenza A, POC: NEGATIVE
Influenza B, POC: NEGATIVE

## 2014-01-04 MED ORDER — METHYLPREDNISOLONE ACETATE 40 MG/ML IJ SUSP: 80.0000 mg | Freq: Once | INTRAMUSCULAR | Status: DC

## 2014-01-04 NOTE — Progress Notes (Signed)
   Subjective:    Patient ID: Derrick [name], male    DOB: 1956-09-02, 58 y.o.   MRN: [id number]  HPI URI Symptoms Onset: 2-3 days  Description: mild post nasal drip, cough, wheezing  Modifying factors:  Former smoker   Symptoms Nasal discharge: yes Fever: no Sore throat: no Cough: yes Wheezing: yes Ear pain: no GI symptoms: n Sick contacts: no  Red Flags  Stiff neck: no Dyspnea: no Rash: no Swallowing difficulty: no  Sinusitis Risk Factors Headache/face pain: no Double sickening: no tooth pain: no  Allergy Risk Factors Sneezing: no Itchy scratchy throat: no Seasonal symptoms: no  Flu Risk Factors Headache: no muscle aches: no severe fatigue: no     Review of Systems  All other systems reviewed and are negative.       Objective:   Physical Exam  Constitutional: He is oriented to person, place, and time. He appears well-developed and well-nourished.  HENT:  Head: Normocephalic and atraumatic.  Right Ear: External ear normal.  Left Ear: External ear normal.  +nasal erythema, rhinorrhea bilaterally, + post oropharyngeal erythema    Eyes: Conjunctivae are normal. Pupils are equal, round, and reactive to light.  Neck: Normal range of motion. Neck supple.  Cardiovascular: Normal rate and regular rhythm.   Pulmonary/Chest: Effort normal and breath sounds normal.  Abdominal: Soft.  Musculoskeletal: Normal range of motion.  Neurological: He is alert and oriented to person, place, and time.  Skin: Skin is warm.          Assessment & Plan:  Sore throat - Plan: POCT Influenza A/B  Wheezing - Plan: methylPREDNISolone acetate (DEPO-MEDROL) injection 80 mg  Suspect likely viral process as source of sxs.  Depomedrol 80mg  IM x1 given wheezing.  Discussed supportive and infectious/resp red flags at length.  Follow up as needed.

## 2014-01-13 MED ORDER — METHYLPREDNISOLONE ACETATE 80 MG/ML IJ SUSP
80.0000 mg | Freq: Once | INTRAMUSCULAR | Status: AC
  Administered 2014-01-04: 80 mg via INTRAMUSCULAR

## 2014-01-13 NOTE — Addendum Note (Signed)
Addended by: Ilean China on: 01/13/2014 10:36 AM   Modules accepted: Orders

## 2014-02-07 ENCOUNTER — Other Ambulatory Visit: Payer: Self-pay | Admitting: Nurse Practitioner

## 2014-02-17 ENCOUNTER — Other Ambulatory Visit: Payer: Self-pay | Admitting: Cardiology

## 2014-06-22 ENCOUNTER — Encounter: Payer: Self-pay | Admitting: Family Medicine

## 2014-06-22 ENCOUNTER — Telehealth: Payer: Self-pay | Admitting: Family Medicine

## 2014-06-22 ENCOUNTER — Ambulatory Visit (INDEPENDENT_AMBULATORY_CARE_PROVIDER_SITE_OTHER): Payer: Commercial Managed Care - HMO | Admitting: Family Medicine

## 2014-06-22 VITALS — BP 114/68 | HR 79 | Temp 96.7°F | Ht 69.0 in | Wt 181.0 lb

## 2014-06-22 DIAGNOSIS — L2089 Other atopic dermatitis: Secondary | ICD-10-CM

## 2014-06-22 MED ORDER — MUPIROCIN CALCIUM 2 % EX CREA: 1.0000 "application " | TOPICAL_CREAM | Freq: Two times a day (BID) | CUTANEOUS | Status: DC

## 2014-06-22 MED ORDER — HYDROXYZINE HCL 10 MG PO TABS: 10.0000 mg | ORAL_TABLET | Freq: Three times a day (TID) | ORAL | Status: DC | PRN

## 2014-06-22 NOTE — Telephone Encounter (Signed)
Patient on way

## 2014-06-22 NOTE — Progress Notes (Signed)
   Subjective:    Patient ID: Derrick [name], male    DOB: 29-Nov-1956, 58 y.o.   MRN: [id number]  HPI Hx rash before; not treated here for this. Problems noted.  Nerves bother him and he scratches.    Review of Systems  Constitutional: Negative.   HENT: Negative.   Eyes: Negative.   Respiratory: Negative.   Cardiovascular: Negative.   Gastrointestinal: Negative.   Endocrine: Negative.   Genitourinary: Negative.   Musculoskeletal: Negative.   Skin: Wound: l foot and dorsum l hand.  Neurological: Negative.   Hematological: Negative.   Psychiatric/Behavioral: The patient is nervous/anxious and is hyperactive.        Objective:   Physical Exam  Constitutional: He is oriented to person, place, and time. He appears well-developed and well-nourished.  HENT:  Head: Normocephalic.  Right Ear: External ear normal.  Left Ear: External ear normal.  Nose: Nose normal.  Mouth/Throat: Oropharynx is clear and moist.  Eyes: Conjunctivae and EOM are normal. Pupils are equal, round, and reactive to light.  Neck: Normal range of motion. Neck supple.  Cardiovascular: Normal rate, regular rhythm, normal heart sounds and intact distal pulses.   Pulmonary/Chest: Effort normal and breath sounds normal.  Abdominal: Soft. Bowel sounds are normal.  Lymphadenopathy:    He has cervical adenopathy.  Neurological: He is alert and oriented to person, place, and time. He has normal reflexes.  Skin: Skin is warm and dry. Rash (excoriations on L ankle and L hand) noted.  Psychiatric: He has a normal mood and affect. His behavior is normal. Judgment and thought content normal.          Assessment & Plan:

## 2014-06-22 NOTE — Patient Instructions (Signed)
Place aContact Dermatitis Contact dermatitis is a reaction to certain substances that touch the skin. Contact dermatitis can be either irritant contact dermatitis or allergic contact dermatitis. Irritant contact dermatitis does not require previous exposure to the substance for a reaction to occur.Allergic contact dermatitis only occurs if you have been exposed to the substance before. Upon a repeat exposure, your body reacts to the substance.  CAUSES  Many substances can cause contact dermatitis. Irritant dermatitis is most commonly caused by repeated exposure to mildly irritating substances, such as:  Makeup.  Soaps.  Detergents.  Bleaches.  Acids.  Metal salts, such as nickel. Allergic contact dermatitis is most commonly caused by exposure to:  Poisonous plants.  Chemicals (deodorants, shampoos).  Jewelry.  Latex.  Neomycin in triple antibiotic cream.  Preservatives in products, including clothing. SYMPTOMS  The area of skin that is exposed may develop:  Dryness or flaking.  Redness.  Cracks.  Itching.  Pain or a burning sensation.  Blisters. With allergic contact dermatitis, there may also be swelling in areas such as the eyelids, mouth, or genitals.  DIAGNOSIS  Your caregiver can usually tell what the problem is by doing a physical exam. In cases where the cause is uncertain and an allergic contact dermatitis is suspected, a patch skin test may be performed to help determine the cause of your dermatitis. TREATMENT Treatment includes protecting the skin from further contact with the irritating substance by avoiding that substance if possible. Barrier creams, powders, and gloves may be helpful. Your caregiver may also recommend:  Steroid creams or ointments applied 2 times daily. For best results, soak the rash area in cool water for 20 minutes. Then apply the medicine. Cover the area with a plastic wrap. You can store the steroid cream in the refrigerator for a  "chilly" effect on your rash. That may decrease itching. Oral steroid medicines may be needed in more severe cases.  Antibiotics or antibacterial ointments if a skin infection is present.  Antihistamine lotion or an antihistamine taken by mouth to ease itching.  Lubricants to keep moisture in your skin.  Burow's solution to reduce redness and soreness or to dry a weeping rash. Mix one packet or tablet of solution in 2 cups cool water. Dip a clean washcloth in the mixture, wring it out a bit, and put it on the affected area. Leave the cloth in place for 30 minutes. Do this as often as possible throughout the day.  Taking several cornstarch or baking soda baths daily if the area is too large to cover with a washcloth. Harsh chemicals, such as alkalis or acids, can cause skin damage that is like a burn. You should flush your skin for 15 to 20 minutes with cold water after such an exposure. You should also seek immediate medical care after exposure. Bandages (dressings), antibiotics, and pain medicine may be needed for severely irritated skin.  HOME CARE INSTRUCTIONS  Avoid the substance that caused your reaction.  Keep the area of skin that is affected away from hot water, soap, sunlight, chemicals, acidic substances, or anything else that would irritate your skin.  Do not scratch the rash. Scratching may cause the rash to become infected.  You may take cool baths to help stop the itching.  Only take over-the-counter or prescription medicines as directed by your caregiver.  See your caregiver for follow-up care as directed to make sure your skin is healing properly. SEEK MEDICAL CARE IF:   Your condition is not better after  3 days of treatment.  You seem to be getting worse.  You see signs of infection such as swelling, tenderness, redness, soreness, or warmth in the affected area.  You have any problems related to your medicines. Document Released: 11/28/2000 Document Revised:  02/23/2012 Document Reviewed: 05/06/2011 [facility] Patient Information 2015 [place], [place]. This information is not intended to replace advice given to you by your health care provider. Make sure you discuss any questions you have with your health care provider. topic dermatitis patient instructions here.

## 2014-07-06 ENCOUNTER — Other Ambulatory Visit: Payer: Self-pay | Admitting: Cardiology

## 2014-07-06 ENCOUNTER — Other Ambulatory Visit: Payer: Self-pay | Admitting: Family Medicine

## 2014-07-19 ENCOUNTER — Other Ambulatory Visit (INDEPENDENT_AMBULATORY_CARE_PROVIDER_SITE_OTHER): Payer: Commercial Managed Care - HMO

## 2014-07-19 DIAGNOSIS — Z79899 Other long term (current) drug therapy: Secondary | ICD-10-CM

## 2014-07-19 NOTE — Progress Notes (Signed)
PAtient came in with labs for Dr.Cunningham

## 2014-07-20 LAB — CBC WITH DIFFERENTIAL
BASOS ABS: 0 10*3/uL (ref 0.0–0.2)
Basos: 0 %
EOS: 3 %
Eosinophils Absolute: 0.2 10*3/uL (ref 0.0–0.4)
HCT: 40.1 % (ref 37.5–51.0)
Hemoglobin: 13.6 g/dL (ref 12.6–17.7)
Immature Grans (Abs): 0 10*3/uL (ref 0.0–0.1)
Immature Granulocytes: 0 %
LYMPHS ABS: 2 10*3/uL (ref 0.7–3.1)
Lymphs: 36 %
MCH: 32.2 pg (ref 26.6–33.0)
MCHC: 33.9 g/dL (ref 31.5–35.7)
MCV: 95 fL (ref 79–97)
MONOS ABS: 0.9 10*3/uL (ref 0.1–0.9)
Monocytes: 17 %
Neutrophils Absolute: 2.4 10*3/uL (ref 1.4–7.0)
Neutrophils Relative %: 44 %
PLATELETS: 159 10*3/uL (ref 150–379)
RBC: 4.22 x10E6/uL (ref 4.14–5.80)
RDW: 13.9 % (ref 12.3–15.4)
WBC: 5.5 10*3/uL (ref 3.4–10.8)

## 2014-07-20 LAB — VALPROIC ACID LEVEL: Valproic Acid Lvl: 40 ug/mL — ABNORMAL LOW (ref 50–100)

## 2014-07-20 LAB — TSH: TSH: 3.36 u[IU]/mL (ref 0.450–4.500)

## 2014-07-20 LAB — HEPATIC FUNCTION PANEL
ALT: 24 IU/L (ref 0–44)
AST: 19 IU/L (ref 0–40)
Albumin: 4.1 g/dL (ref 3.5–5.5)
Alkaline Phosphatase: 55 IU/L (ref 39–117)
Bilirubin, Direct: 0.07 mg/dL (ref 0.00–0.40)
Total Protein: 6 g/dL (ref 6.0–8.5)

## 2014-07-20 LAB — TESTOSTERONE,FREE AND TOTAL
Testosterone, Free: 9.9 pg/mL (ref 7.2–24.0)
Testosterone: 414 ng/dL (ref 348–1197)

## 2014-08-04 ENCOUNTER — Other Ambulatory Visit: Payer: Self-pay

## 2014-08-04 MED ORDER — METOPROLOL TARTRATE 25 MG PO TABS: ORAL_TABLET | ORAL | Status: DC

## 2014-08-16 ENCOUNTER — Telehealth: Payer: Self-pay

## 2014-08-16 DIAGNOSIS — L309 Dermatitis, unspecified: Secondary | ICD-10-CM

## 2014-08-16 NOTE — Telephone Encounter (Signed)
Would like a referral to Dermatologist for itching  Dr Nevada Crane in [place]

## 2014-09-13 ENCOUNTER — Ambulatory Visit: Payer: Commercial Managed Care - HMO

## 2014-10-03 ENCOUNTER — Ambulatory Visit (INDEPENDENT_AMBULATORY_CARE_PROVIDER_SITE_OTHER): Payer: Commercial Managed Care - HMO

## 2014-10-03 DIAGNOSIS — Z23 Encounter for immunization: Secondary | ICD-10-CM

## 2014-10-18 ENCOUNTER — Encounter: Payer: Self-pay | Admitting: Nurse Practitioner

## 2014-10-18 ENCOUNTER — Ambulatory Visit (INDEPENDENT_AMBULATORY_CARE_PROVIDER_SITE_OTHER): Payer: Commercial Managed Care - HMO | Admitting: Nurse Practitioner

## 2014-10-18 VITALS — BP 110/80 | HR 78 | Ht 69.0 in | Wt 184.1 lb

## 2014-10-18 DIAGNOSIS — I059 Rheumatic mitral valve disease, unspecified: Secondary | ICD-10-CM

## 2014-10-18 DIAGNOSIS — R0600 Dyspnea, unspecified: Secondary | ICD-10-CM

## 2014-10-18 DIAGNOSIS — I2510 Atherosclerotic heart disease of native coronary artery without angina pectoris: Secondary | ICD-10-CM

## 2014-10-18 DIAGNOSIS — E785 Hyperlipidemia, unspecified: Secondary | ICD-10-CM

## 2014-10-18 LAB — BASIC METABOLIC PANEL
BUN: 16 mg/dL (ref 6–23)
CO2: 26 mEq/L (ref 19–32)
Calcium: 9.4 mg/dL (ref 8.4–10.5)
Chloride: 104 mEq/L (ref 96–112)
Creatinine, Ser: 0.9 mg/dL (ref 0.4–1.5)
GFR: 90.2 mL/min (ref >60.00)
Glucose, Bld: 95 mg/dL (ref 70–99)
Potassium: 4.8 mEq/L (ref 3.5–5.1)
Sodium: 140 mEq/L (ref 135–145)

## 2014-10-18 LAB — HEPATIC FUNCTION PANEL
ALT: 47 U/L (ref 0–53)
AST: 36 U/L (ref 0–37)
Albumin: 3.5 g/dL (ref 3.5–5.2)
Alkaline Phosphatase: 43 U/L (ref 39–117)
Bilirubin, Direct: 0 mg/dL (ref 0.0–0.3)
Total Bilirubin: 0.6 mg/dL (ref 0.2–1.2)
Total Protein: 6.8 g/dL (ref 6.0–8.3)

## 2014-10-18 LAB — LIPID PANEL
Cholesterol: 151 mg/dL (ref 0–200)
HDL: 37.2 mg/dL — ABNORMAL LOW (ref >39.00)
LDL Cholesterol: 74 mg/dL (ref 0–99)
NonHDL: 113.8
Total CHOL/HDL Ratio: 4
Triglycerides: 199 mg/dL — ABNORMAL HIGH (ref 0.0–149.0)
VLDL: 39.8 mg/dL (ref 0.0–40.0)

## 2014-10-18 MED ORDER — METOPROLOL TARTRATE 25 MG PO TABS: ORAL_TABLET | ORAL | Status: DC

## 2014-10-18 MED ORDER — ATORVASTATIN CALCIUM 80 MG PO TABS: ORAL_TABLET | ORAL | Status: DC

## 2014-10-18 NOTE — Patient Instructions (Addendum)
We will be checking the following labs today BMET, HPF, lipids  Stay on your current medicines  We need to update your echocardiogram  I have refilled your metoprolol and Lipitor today  See Dr. [name] in one year  Call the [place] office at [phone] if you have any questions, problems or concerns.

## 2014-10-18 NOTE — Progress Notes (Signed)
Derrick [name] Date of Birth: 1956/04/24 Medical Record #[id number]  History of Present Illness: Derrick [name] is seen back today for a one year check. Seen for Derrick [name]. He is a 58 yo with history of CAD s/p inferior STEMI with DES x 2 to RCA in 2004 and moderate MR with mitral valve prolapse. Patient has been seen by Derrick [name] in the past. His other issues include tobacco abuse, bipolar disorder, OSA and HLD.  Last echo from 09/2013 with EF of 55 to 60%, mild LVH, MVP with moderate MR.  Last seen a year ago for the first time by Derrick [name]. Cardiac status appeared stable but was short of breath - ordered a Myoview, PFTs and BNP. BNP was 18. Low risk Myoview noted. PFTs without marked abnormality.  Comes back today. Here alone. [place] in the parking lot coming in to this visit - 2 abrasions on his left hand and left knee. Says he fell on his "butt" trying to take a short cut into the office. Did not pass out. No chest pain. Says he is not smoking any more. On Depakote and lamictal for his bipolar disorder. No more shortness of breath. Says his breathing is good.   Current Outpatient Prescriptions  Medication Sig Dispense Refill  . acetaminophen (TYLENOL) 500 MG tablet Take 500 mg by mouth every 6 (six) hours as needed for pain.    [name] aspirin 81 MG tablet Take 81 mg by mouth daily.    [name] atorvastatin (LIPITOR) 80 MG tablet TAKE ONE TABLET BY MOUTH ONE TIME DAILY 90 tablet 3  . buPROPion (WELLBUTRIN XL) 150 MG 24 hr tablet Take 150 mg by mouth every morning.     . divalproex (DEPAKOTE ER) 500 MG 24 hr tablet Take 500 mg by mouth daily.     [name] escitalopram (LEXAPRO) 10 MG tablet Take 10 mg by mouth daily.     . hydrOXYzine (ATARAX/VISTARIL) 10 MG tablet Take 1 tablet (10 mg total) by mouth 3 (three) times daily as needed. 30 tablet 0  . ibuprofen (ADVIL,MOTRIN) 600 MG tablet Take 600 mg by mouth every 6 (six) hours as needed.     . lamoTRIgine (LAMICTAL) 200 MG tablet Take 200 mg by mouth daily.      . metoprolol tartrate (LOPRESSOR) 25 MG tablet TAKE ONE TABLET BY MOUTH TWICE DAILY 180 tablet 3  . montelukast (SINGULAIR) 10 MG tablet TAKE ONE TABLET BY MOUTH ONE TIME DAILY 30 tablet 3  . mupirocin cream (BACTROBAN) 2 % Apply 1 application topically 2 (two) times daily. 15 g 0  . nitroGLYCERIN (NITROSTAT) 0.4 MG SL tablet Place 1 tablet (0.4 mg total) under the tongue every 5 (five) minutes as needed for chest pain. 25 tablet 3  . Tamsulosin HCl (FLOMAX) 0.4 MG CAPS 0.4 mg daily.      No current facility-administered medications for this visit.    No Known Allergies  PMH: 1. Bipolar disorder 2. OSA 3. CAD: STEMI 2004 with 2 overlapping Cypher stents to RCA.  4. Hyperlipidemia 5. Mitral regurgitation: Echo (10/14) with EF 55-60%, mild LVH, MV prolapse with moderate MR.     Past Medical History  Diagnosis Date  . Depression   . Coronary artery disease     DES x 2 to RCA in 2004  . Anxiety   . Bipolar 1 disorder   . OSA (obstructive sleep apnea) 03/22/2013  . BPH (benign prostatic hypertrophy)   . Mitral regurgitation and aortic stenosis   .  MVP (mitral valve prolapse)     Past Surgical History  Procedure Laterality Date  . Coronary artery bypass graft      History  Smoking status  . Former Smoker  . Types: Cigarettes  . Quit date: 02/05/2003  Smokeless tobacco  . Not on file    History  Alcohol Use No    History reviewed. No pertinent family history.  Review of Systems: The review of systems is per the HPI.  All other systems were reviewed and are negative.  Physical Exam: BP 110/80 mmHg  Pulse 78  Ht 5\' 9"  (1.753 m)  Wt 184 lb 1.9 oz (83.516 kg)  BMI 27.18 kg/m2 Patient is pleasant and in no acute distress.Tremulous. Skin is warm and dry. Color is normal.  HEENT is unremarkable. Normocephalic/atraumatic. PERRL. Sclera are nonicteric. Neck is supple. No masses. No JVD. Lungs are clear. Cardiac exam shows a regular rate and rhythm. Systolic murmur  noted. Abdomen is soft. Extremities are without edema. Gait and ROM are intact. No gross neurologic deficits noted. He has some abrasions on his left hand and left knee that were cleaned with soap and water and band aids applied.   Wt Readings from Last 3 Encounters:  10/18/14 184 lb 1.9 oz (83.516 kg)  06/22/14 181 lb (82.101 kg)  01/04/14 186 lb (84.369 kg)    LABORATORY DATA/PROCEDURES:  EKG today shows sinus rhythm  Lab Results  Component Value Date   WBC 5.5 07/19/2014   HGB 13.6 07/19/2014   HCT 40.1 07/19/2014   PLT 159 07/19/2014   GLUCOSE 114* 10/18/2013   CHOL 193 10/18/2013   TRIG 195.0* 10/18/2013   HDL 40.70 10/18/2013   LDLCALC 113* 10/18/2013   ALT 24 07/19/2014   AST 19 07/19/2014   NA 139 10/18/2013   K 4.7 10/18/2013   CL 103 10/18/2013   CREATININE 0.9 10/18/2013   BUN 15 10/18/2013   CO2 30 10/18/2013   TSH 3.360 07/19/2014    BNP (last 3 results) No results for input(s): PROBNP in the last 8760 hours.    Myoview Impression Exercise Capacity: Good exercise capacity. BP Response: Normal blood pressure response. Clinical Symptoms: No significant symptoms noted. ECG Impression: No significant ST segment change suggestive of ischemia. Comparison with Prior Nuclear Study: No images to compare  Overall Impression: Low risk stress nuclear study Small inferolateral wall infarct at mid and basal level with no ischemia.  LV Ejection Fraction: 48%. LV Wall Motion: Mildly decreased LV function with inferior lateral wall hypokinesis  Derrick [name]  Overall low risk study. Mildly decreased EF. Small area of infarction with no ischemia. Please inform patient.   Derrick [name] 11/04/2013  Assessment / Plan: 1. Exertional dyspnea -  This is resolved and currently not an issue.   2. CAD: no symptoms reported. Continue with current regimen.   3. Smoking: says he is not smoking  4. Mitral regurgitation: Moderate, from mitral valve prolapse.  Needs echo repeated.   5. Hyperlipidemia: Goal LDL < 70 with CAD. Continue statin, will check lipids.   Arrange for labs today and echo scheduled. See back in a year.   Patient is agreeable to this plan and will call if any problems develop in the interim.   Burtis Junes, RN, [facility] [address] [place], [place]  [id number] [phone]

## 2014-10-31 ENCOUNTER — Other Ambulatory Visit: Payer: Self-pay | Admitting: *Deleted

## 2014-10-31 DIAGNOSIS — I25810 Atherosclerosis of coronary artery bypass graft(s) without angina pectoris: Secondary | ICD-10-CM

## 2014-11-01 ENCOUNTER — Ambulatory Visit (HOSPITAL_COMMUNITY): Payer: Medicare HMO | Attending: Family Medicine | Admitting: Radiology

## 2014-11-01 DIAGNOSIS — I25810 Atherosclerosis of coronary artery bypass graft(s) without angina pectoris: Secondary | ICD-10-CM | POA: Diagnosis present

## 2014-11-01 DIAGNOSIS — E785 Hyperlipidemia, unspecified: Secondary | ICD-10-CM | POA: Insufficient documentation

## 2014-11-01 NOTE — Progress Notes (Signed)
Echocardiogram performed.  

## 2014-11-17 ENCOUNTER — Other Ambulatory Visit: Payer: Self-pay | Admitting: Family Medicine

## 2014-11-29 ENCOUNTER — Other Ambulatory Visit (INDEPENDENT_AMBULATORY_CARE_PROVIDER_SITE_OTHER): Payer: Commercial Managed Care - HMO

## 2014-11-29 DIAGNOSIS — F3131 Bipolar disorder, current episode depressed, mild: Secondary | ICD-10-CM

## 2014-11-29 NOTE — Progress Notes (Signed)
Lab only 

## 2014-11-30 LAB — CBC WITH DIFFERENTIAL
BASOS ABS: 0 10*3/uL (ref 0.0–0.2)
Basos: 0 %
EOS: 1 %
Eosinophils Absolute: 0 10*3/uL (ref 0.0–0.4)
HCT: 39.5 % (ref 37.5–51.0)
Hemoglobin: 13.3 g/dL (ref 12.6–17.7)
IMMATURE GRANULOCYTES: 0 %
Immature Grans (Abs): 0 10*3/uL (ref 0.0–0.1)
Lymphocytes Absolute: 1.9 10*3/uL (ref 0.7–3.1)
Lymphs: 35 %
MCH: 33.3 pg — ABNORMAL HIGH (ref 26.6–33.0)
MCHC: 33.7 g/dL (ref 31.5–35.7)
MCV: 99 fL — ABNORMAL HIGH (ref 79–97)
MONOCYTES: 12 %
Monocytes Absolute: 0.7 10*3/uL (ref 0.1–0.9)
NEUTROS PCT: 52 %
Neutrophils Absolute: 2.7 10*3/uL (ref 1.4–7.0)
PLATELETS: 133 10*3/uL — AB (ref 150–379)
RBC: 4 x10E6/uL — AB (ref 4.14–5.80)
RDW: 14 % (ref 12.3–15.4)
WBC: 5.3 10*3/uL (ref 3.4–10.8)

## 2014-11-30 LAB — HEPATIC FUNCTION PANEL
ALT: 37 IU/L (ref 0–44)
AST: 36 IU/L (ref 0–40)
Albumin: 4.3 g/dL (ref 3.5–5.5)
Alkaline Phosphatase: 48 IU/L (ref 39–117)
Bilirubin, Direct: 0.12 mg/dL (ref 0.00–0.40)
Total Bilirubin: 0.3 mg/dL (ref 0.0–1.2)
Total Protein: 6.1 g/dL (ref 6.0–8.5)

## 2014-11-30 LAB — VALPROIC ACID LEVEL: Valproic Acid Lvl: 100 ug/mL (ref 50–100)

## 2014-11-30 LAB — AMMONIA: Ammonia: 77 ug/dL (ref 27–102)

## 2014-12-18 ENCOUNTER — Encounter: Payer: Self-pay | Admitting: Family Medicine

## 2014-12-18 ENCOUNTER — Ambulatory Visit (INDEPENDENT_AMBULATORY_CARE_PROVIDER_SITE_OTHER): Payer: Commercial Managed Care - HMO | Admitting: Family Medicine

## 2014-12-18 VITALS — BP 130/65 | HR 88 | Temp 97.1°F | Ht 69.0 in | Wt 186.6 lb

## 2014-12-18 DIAGNOSIS — F319 Bipolar disorder, unspecified: Secondary | ICD-10-CM

## 2014-12-18 DIAGNOSIS — G4733 Obstructive sleep apnea (adult) (pediatric): Secondary | ICD-10-CM

## 2014-12-18 MED ORDER — ALBUTEROL SULFATE HFA 108 (90 BASE) MCG/ACT IN AERS: 2.0000 | INHALATION_SPRAY | Freq: Four times a day (QID) | RESPIRATORY_TRACT | Status: DC | PRN

## 2014-12-18 NOTE — Progress Notes (Signed)
   Subjective:    Patient ID: Derrick [name], male    DOB: [birth year], 59 y.o.   MRN: [id number]  HPI 59 year old gentleman here with chief complaint of snoring and daytime sleepiness. There is also a history of bipolar disease for which he is followed by a psychologist psychiatrist. A letter issue may be important because he is on some medicine such as Depakote which may be contributing to his daytime sleepiness. He had a sleep study last year but it was felt that it was not positive enough for CPAP. History is only of daytime sleepiness and no real apnea but he does not sleep in the room with his wife who might otherwise be able to document apneic episodes.  We spent some time talking about his medications. Apparently Depakote is been tapered from 2500-500 mg. Since he is on another mood stabilizer, Lamictal, I suggested maybe stopping Depakote to see if that might be contributing to his daytime drowsiness. He will address this with his psychiatrist at a visit later this month. If that does not help the problem, then we may want to consider another sleep study.    Review of Systems  Constitutional: Negative.   HENT: Negative.   Cardiovascular: Negative.   Psychiatric/Behavioral: Positive for sleep disturbance.       Objective:   Physical Exam  Constitutional: He is oriented to person, place, and time. He appears well-developed.  Cardiovascular: Normal rate.   Pulmonary/Chest: Effort normal and breath sounds normal. He has no wheezes.  Neurological: He is alert and oriented to person, place, and time.    BP 130/65 mmHg  Pulse 88  Temp(Src) 97.1 F (36.2 C)  Ht 5\' 9"  (1.753 m)  Wt 186 lb 9.6 oz (84.641 kg)  BMI 27.54 kg/m2      Assessment & Plan:  1. Obstructive sleep apnea See discussion inhistory of present illness part of note  2. Bipolar affective disorder, most recent episode unspecified type, remission status unspecified Discussed with psychologist psychiatrist  possibility of tapering Depakote and remaining only on Lamictal as mood stabilizer  Wardell Honour MD

## 2015-01-08 ENCOUNTER — Other Ambulatory Visit: Payer: Self-pay | Admitting: Family Medicine

## 2015-01-13 ENCOUNTER — Other Ambulatory Visit: Payer: Self-pay | Admitting: Family Medicine

## 2015-01-15 ENCOUNTER — Other Ambulatory Visit: Payer: Self-pay | Admitting: Family Medicine

## 2015-03-05 DIAGNOSIS — F3131 Bipolar disorder, current episode depressed, mild: Secondary | ICD-10-CM | POA: Diagnosis not present

## 2015-03-14 DIAGNOSIS — Z0100 Encounter for examination of eyes and vision without abnormal findings: Secondary | ICD-10-CM | POA: Diagnosis not present

## 2015-03-20 ENCOUNTER — Ambulatory Visit (INDEPENDENT_AMBULATORY_CARE_PROVIDER_SITE_OTHER): Payer: Commercial Managed Care - HMO | Admitting: Urology

## 2015-03-20 DIAGNOSIS — N403 Nodular prostate with lower urinary tract symptoms: Secondary | ICD-10-CM

## 2015-03-20 DIAGNOSIS — N401 Enlarged prostate with lower urinary tract symptoms: Secondary | ICD-10-CM

## 2015-03-20 DIAGNOSIS — N5201 Erectile dysfunction due to arterial insufficiency: Secondary | ICD-10-CM | POA: Diagnosis not present

## 2015-04-02 DIAGNOSIS — F3131 Bipolar disorder, current episode depressed, mild: Secondary | ICD-10-CM | POA: Diagnosis not present

## 2015-04-30 DIAGNOSIS — F3131 Bipolar disorder, current episode depressed, mild: Secondary | ICD-10-CM | POA: Diagnosis not present

## 2015-06-24 ENCOUNTER — Other Ambulatory Visit: Payer: Self-pay | Admitting: Family Medicine

## 2015-07-12 DIAGNOSIS — F3131 Bipolar disorder, current episode depressed, mild: Secondary | ICD-10-CM | POA: Diagnosis not present

## 2015-07-19 DIAGNOSIS — F3131 Bipolar disorder, current episode depressed, mild: Secondary | ICD-10-CM | POA: Diagnosis not present

## 2015-07-25 ENCOUNTER — Other Ambulatory Visit: Payer: Self-pay | Admitting: Family Medicine

## 2015-08-09 DIAGNOSIS — F3131 Bipolar disorder, current episode depressed, mild: Secondary | ICD-10-CM | POA: Diagnosis not present

## 2015-08-22 ENCOUNTER — Other Ambulatory Visit: Payer: Self-pay | Admitting: Family Medicine

## 2015-09-18 DIAGNOSIS — F3131 Bipolar disorder, current episode depressed, mild: Secondary | ICD-10-CM | POA: Diagnosis not present

## 2015-10-12 ENCOUNTER — Other Ambulatory Visit: Payer: Self-pay | Admitting: Nurse Practitioner

## 2015-10-16 ENCOUNTER — Ambulatory Visit (INDEPENDENT_AMBULATORY_CARE_PROVIDER_SITE_OTHER): Payer: Commercial Managed Care - HMO

## 2015-10-16 DIAGNOSIS — Z23 Encounter for immunization: Secondary | ICD-10-CM | POA: Diagnosis not present

## 2015-10-23 ENCOUNTER — Ambulatory Visit: Payer: Commercial Managed Care - HMO | Admitting: Nurse Practitioner

## 2015-10-29 DIAGNOSIS — F3131 Bipolar disorder, current episode depressed, mild: Secondary | ICD-10-CM | POA: Diagnosis not present

## 2015-12-10 ENCOUNTER — Other Ambulatory Visit: Payer: Self-pay | Admitting: Nurse Practitioner

## 2016-01-01 ENCOUNTER — Telehealth: Payer: Self-pay | Admitting: Family Medicine

## 2016-01-03 DIAGNOSIS — F3175 Bipolar disorder, in partial remission, most recent episode depressed: Secondary | ICD-10-CM | POA: Diagnosis not present

## 2016-01-03 NOTE — Telephone Encounter (Signed)
scheduled

## 2016-01-08 ENCOUNTER — Ambulatory Visit (INDEPENDENT_AMBULATORY_CARE_PROVIDER_SITE_OTHER): Payer: Commercial Managed Care - HMO | Admitting: Family Medicine

## 2016-01-08 ENCOUNTER — Encounter: Payer: Self-pay | Admitting: Family Medicine

## 2016-01-08 VITALS — BP 134/81 | HR 87 | Temp 97.9°F | Ht 69.0 in | Wt 189.4 lb

## 2016-01-08 DIAGNOSIS — E7800 Pure hypercholesterolemia, unspecified: Secondary | ICD-10-CM | POA: Diagnosis not present

## 2016-01-08 DIAGNOSIS — E785 Hyperlipidemia, unspecified: Secondary | ICD-10-CM | POA: Diagnosis not present

## 2016-01-08 DIAGNOSIS — F3170 Bipolar disorder, currently in remission, most recent episode unspecified: Secondary | ICD-10-CM | POA: Diagnosis not present

## 2016-01-08 DIAGNOSIS — G4733 Obstructive sleep apnea (adult) (pediatric): Secondary | ICD-10-CM | POA: Diagnosis not present

## 2016-01-08 DIAGNOSIS — N400 Enlarged prostate without lower urinary tract symptoms: Secondary | ICD-10-CM | POA: Diagnosis not present

## 2016-01-08 MED ORDER — MONTELUKAST SODIUM 10 MG PO TABS: 10.0000 mg | ORAL_TABLET | Freq: Every day | ORAL | Status: DC

## 2016-01-08 MED ORDER — ATORVASTATIN CALCIUM 80 MG PO TABS: 80.0000 mg | ORAL_TABLET | Freq: Every day | ORAL | Status: DC

## 2016-01-08 NOTE — Progress Notes (Signed)
Subjective:    Patient ID: Derrick [name], male    DOB: 01/10/1956, 60 y.o.   MRN: [id number]  HPI 60 year old male who is here for a physical. His ongoing problems include bipolar disorder, for which he sees a psychologist and psychiatrist, BPH, sleep apnea, asthma, hypertension, and hyperlipidemia. He has no new complaints or symptoms today. Symptoms seem to be well managed he is on a combination of Depakote Lamictal and 2 antidepressants for the bipolar disorder. His sleep apnea is not bad enough to warrant treatment.    Review of Systems  Constitutional: Negative.   HENT: Negative.   Respiratory: Negative.   Cardiovascular: Negative.   Gastrointestinal: Negative.   Genitourinary: Negative.   Neurological: Negative.   Psychiatric/Behavioral: Negative.    Patient Active Problem List   Diagnosis Date Noted  . OSA (obstructive sleep apnea) 03/22/2013  . Memory loss 02/05/2013  . Low back pain 02/05/2013  . Unspecified hereditary and idiopathic peripheral neuropathy 02/05/2013  . Essential and other specified forms of tremor 02/05/2013  . Parasomnia 02/05/2013  . Obstructive sleep apnea 02/05/2013  . Dyspnea 09/20/2012  . MITRAL STENOSIS/ INSUFFICIENCY, NON-RHEUMATIC 01/06/2011  . HYPERCHOLESTEROLEMIA 10/05/2009  . Bipolar disorder ([place]) 10/05/2009  . ANXIETY DISORDER 10/05/2009  . TOBACCO ABUSE 10/05/2009  . CAD 10/05/2009   Outpatient Encounter Prescriptions as of 01/08/2016  Medication Sig  . acetaminophen (TYLENOL) 500 MG tablet Take 500 mg by mouth every 6 (six) hours as needed for pain.  [name] aspirin 81 MG tablet Take 81 mg by mouth daily.  [name] atorvastatin (LIPITOR) 80 MG tablet TAKE 1 TABLET EVERY DAY  . buPROPion (WELLBUTRIN XL) 150 MG 24 hr tablet Take 150 mg by mouth every morning.   . divalproex (DEPAKOTE ER) 500 MG 24 hr tablet Take 500 mg by mouth daily.   [name] escitalopram (LEXAPRO) 10 MG tablet Take 10 mg by mouth daily.   . hydrOXYzine (ATARAX/VISTARIL) 10 MG  tablet Take 1 tablet (10 mg total) by mouth 3 (three) times daily as needed.  [name] ibuprofen (ADVIL,MOTRIN) 600 MG tablet Take 600 mg by mouth every 6 (six) hours as needed.   . lamoTRIgine (LAMICTAL) 200 MG tablet Take 200 mg by mouth daily.   . metoprolol tartrate (LOPRESSOR) 25 MG tablet TAKE ONE TABLET BY MOUTH TWICE DAILY  . montelukast (SINGULAIR) 10 MG tablet TAKE ONE TABLET BY MOUTH ONE TIME DAILY  . VENTOLIN HFA 108 (90 BASE) MCG/ACT inhaler INHALE 2 PUFFS INTO THE LUNGS EVERY 6 (SIX) HOURS AS  NEEDED FOR WHEEZI NG OR SHORTNESS OF BREATH.  . nitroGLYCERIN (NITROSTAT) 0.4 MG SL tablet Place 1 tablet (0.4 mg total) under the tongue every 5 (five) minutes as needed for chest pain. (Patient not taking: Reported on 01/08/2016)  . Tamsulosin HCl (FLOMAX) 0.4 MG CAPS 0.4 mg daily. Reported on 01/08/2016   No facility-administered encounter medications on file as of 01/08/2016.       Objective:   Physical Exam  Constitutional: He is oriented to person, place, and time. He appears well-developed and well-nourished.  HENT:  Head: Normocephalic.  Right Ear: External ear normal.  Left Ear: External ear normal.  Mouth/Throat: Oropharynx is clear and moist.  Eyes: Pupils are equal, round, and reactive to light.  Neck: Normal range of motion.  Cardiovascular: Normal rate, regular rhythm, normal heart sounds and intact distal pulses.   Pulmonary/Chest: Effort normal and breath sounds normal.  Abdominal: Soft. Bowel sounds are normal. There is no tenderness.  Genitourinary: Rectum  normal and prostate normal.  Musculoskeletal: Normal range of motion.  Neurological: He is alert and oriented to person, place, and time.  Psychiatric: He has a normal mood and affect. His behavior is normal.          Assessment & Plan:  1. Hyperlipemia Tolerating statin well when last checked in 2015 his LDL was at goal. - CMP14+EGFR; Future - Lipid panel; Future - PSA, total and free; Future  2. BPH (benign  prostatic hyperplasia) Symptoms are well managed with Flomax. Continue same - CMP14+EGFR; Future - Lipid panel; Future - PSA, total and free; Future  3. OSA (obstructive sleep apnea) He does have daytime sleepiness but did not warrant a CPAP machine    5. Bipolar disorder in full remission, most recent episode unspecified type ([place]) Continued as before with mood stabilizers and antidepressants  Wardell Honour MD

## 2016-01-09 ENCOUNTER — Other Ambulatory Visit (INDEPENDENT_AMBULATORY_CARE_PROVIDER_SITE_OTHER): Payer: Commercial Managed Care - HMO

## 2016-01-09 ENCOUNTER — Other Ambulatory Visit: Payer: Self-pay | Admitting: *Deleted

## 2016-01-09 DIAGNOSIS — Z1159 Encounter for screening for other viral diseases: Secondary | ICD-10-CM

## 2016-01-09 DIAGNOSIS — E785 Hyperlipidemia, unspecified: Secondary | ICD-10-CM | POA: Diagnosis not present

## 2016-01-09 DIAGNOSIS — N400 Enlarged prostate without lower urinary tract symptoms: Secondary | ICD-10-CM

## 2016-01-09 NOTE — Progress Notes (Signed)
Lab only 

## 2016-01-10 LAB — CMP14+EGFR
A/G RATIO: 2.4 (ref 1.1–2.5)
ALK PHOS: 51 IU/L (ref 39–117)
ALT: 31 IU/L (ref 0–44)
AST: 25 IU/L (ref 0–40)
Albumin: 4.6 g/dL (ref 3.5–5.5)
BILIRUBIN TOTAL: 0.3 mg/dL (ref 0.0–1.2)
BUN/Creatinine Ratio: 10 (ref 9–20)
BUN: 9 mg/dL (ref 6–24)
CO2: 23 mmol/L (ref 18–29)
Calcium: 9 mg/dL (ref 8.7–10.2)
Chloride: 102 mmol/L (ref 96–106)
Creatinine, Ser: 0.92 mg/dL (ref 0.76–1.27)
GFR calc non Af Amer: 91 mL/min/{1.73_m2} (ref >59)
GFR, EST AFRICAN AMERICAN: 106 mL/min/{1.73_m2} (ref >59)
GLUCOSE: 107 mg/dL — AB (ref 65–99)
Globulin, Total: 1.9 g/dL (ref 1.5–4.5)
POTASSIUM: 4.3 mmol/L (ref 3.5–5.2)
Sodium: 143 mmol/L (ref 134–144)
TOTAL PROTEIN: 6.5 g/dL (ref 6.0–8.5)

## 2016-01-10 LAB — LIPID PANEL
CHOL/HDL RATIO: 3.4 ratio (ref 0.0–5.0)
Cholesterol, Total: 140 mg/dL (ref 100–199)
HDL: 41 mg/dL (ref >39)
LDL CALC: 79 mg/dL (ref 0–99)
TRIGLYCERIDES: 98 mg/dL (ref 0–149)
VLDL CHOLESTEROL CAL: 20 mg/dL (ref 5–40)

## 2016-01-10 LAB — PSA, TOTAL AND FREE
PSA FREE: 0.33 ng/mL
PSA, Free Pct: 41.3 %
Prostate Specific Ag, Serum: 0.8 ng/mL (ref 0.0–4.0)

## 2016-01-14 LAB — HEPATITIS C ANTIBODY: Hep C Virus Ab: <0.1 s/co ratio (ref 0.0–0.9)

## 2016-01-14 LAB — SPECIMEN STATUS REPORT

## 2016-01-18 ENCOUNTER — Other Ambulatory Visit: Payer: Commercial Managed Care - HMO

## 2016-01-18 DIAGNOSIS — F3175 Bipolar disorder, in partial remission, most recent episode depressed: Secondary | ICD-10-CM

## 2016-01-18 DIAGNOSIS — Z1159 Encounter for screening for other viral diseases: Secondary | ICD-10-CM

## 2016-01-18 NOTE — Progress Notes (Signed)
Lab only 

## 2016-01-19 LAB — CBC WITH DIFFERENTIAL/PLATELET
BASOS: 0 %
Basophils Absolute: 0 10*3/uL (ref 0.0–0.2)
EOS (ABSOLUTE): 0.1 10*3/uL (ref 0.0–0.4)
EOS: 2 %
HEMATOCRIT: 42.7 % (ref 37.5–51.0)
HEMOGLOBIN: 14.3 g/dL (ref 12.6–17.7)
IMMATURE GRANS (ABS): 0 10*3/uL (ref 0.0–0.1)
IMMATURE GRANULOCYTES: 0 %
LYMPHS: 39 %
Lymphocytes Absolute: 2 10*3/uL (ref 0.7–3.1)
MCH: 32.4 pg (ref 26.6–33.0)
MCHC: 33.5 g/dL (ref 31.5–35.7)
MCV: 97 fL (ref 79–97)
MONOCYTES: 15 %
Monocytes Absolute: 0.8 10*3/uL (ref 0.1–0.9)
NEUTROS ABS: 2.2 10*3/uL (ref 1.4–7.0)
Neutrophils: 44 %
Platelets: 174 10*3/uL (ref 150–379)
RBC: 4.42 x10E6/uL (ref 4.14–5.80)
RDW: 13.4 % (ref 12.3–15.4)
WBC: 5.2 10*3/uL (ref 3.4–10.8)

## 2016-01-19 LAB — HEPATIC FUNCTION PANEL
ALBUMIN: 4.2 g/dL (ref 3.5–5.5)
ALT: 37 IU/L (ref 0–44)
AST: 29 IU/L (ref 0–40)
Alkaline Phosphatase: 54 IU/L (ref 39–117)
BILIRUBIN TOTAL: 0.2 mg/dL (ref 0.0–1.2)
BILIRUBIN, DIRECT: 0.07 mg/dL (ref 0.00–0.40)
Total Protein: 6.3 g/dL (ref 6.0–8.5)

## 2016-01-19 LAB — VALPROIC ACID LEVEL: Valproic Acid Lvl: 62 ug/mL (ref 50–100)

## 2016-01-21 DIAGNOSIS — F3175 Bipolar disorder, in partial remission, most recent episode depressed: Secondary | ICD-10-CM | POA: Diagnosis not present

## 2016-01-22 ENCOUNTER — Encounter: Payer: Self-pay | Admitting: *Deleted

## 2016-02-07 ENCOUNTER — Other Ambulatory Visit: Payer: Self-pay | Admitting: Nurse Practitioner

## 2016-03-27 ENCOUNTER — Encounter: Payer: Self-pay | Admitting: Family Medicine

## 2016-03-27 ENCOUNTER — Encounter (INDEPENDENT_AMBULATORY_CARE_PROVIDER_SITE_OTHER): Payer: Self-pay

## 2016-03-27 ENCOUNTER — Ambulatory Visit (INDEPENDENT_AMBULATORY_CARE_PROVIDER_SITE_OTHER): Payer: Commercial Managed Care - HMO | Admitting: Family Medicine

## 2016-03-27 VITALS — BP 106/68 | HR 81 | Temp 97.6°F | Ht 69.0 in | Wt 183.6 lb

## 2016-03-27 DIAGNOSIS — B079 Viral wart, unspecified: Secondary | ICD-10-CM

## 2016-03-27 NOTE — Progress Notes (Signed)
   Subjective:    Patient ID: Derrick [name], male    DOB: 14-Sep-1956, 60 y.o.   MRN: [id number]  HPI 60 year old gentleman who has several skin issues. He has a rather large lesion on the dorsum of his left wrist that he thinks may be a wart. I'm not so sure. Does not have a typical appearing of a skin cancer. He looks more like a granuloma but does not have the characteristics of friability and easy bleeding  that a granuloma might have. He has recently worked on the area with a knife cutting some of it down in a paring fashion.  Patient Active Problem List   Diagnosis Date Noted  . OSA (obstructive sleep apnea) 03/22/2013  . Memory loss 02/05/2013  . Low back pain 02/05/2013  . Unspecified hereditary and idiopathic peripheral neuropathy 02/05/2013  . Essential and other specified forms of tremor 02/05/2013  . Parasomnia 02/05/2013  . Obstructive sleep apnea 02/05/2013  . Dyspnea 09/20/2012  . MITRAL STENOSIS/ INSUFFICIENCY, NON-RHEUMATIC 01/06/2011  . HYPERCHOLESTEROLEMIA 10/05/2009  . Bipolar disorder ([place]) 10/05/2009  . ANXIETY DISORDER 10/05/2009  . TOBACCO ABUSE 10/05/2009  . CAD 10/05/2009   Outpatient Encounter Prescriptions as of 03/27/2016  Medication Sig  . acetaminophen (TYLENOL) 500 MG tablet Take 500 mg by mouth every 6 (six) hours as needed for pain.  [name] aspirin 81 MG tablet Take 81 mg by mouth daily.  [name] atorvastatin (LIPITOR) 80 MG tablet Take 1 tablet (80 mg total) by mouth daily.  [name] buPROPion (WELLBUTRIN XL) 150 MG 24 hr tablet Take 150 mg by mouth every morning.   . divalproex (DEPAKOTE ER) 500 MG 24 hr tablet Take 500 mg by mouth daily.   [name] escitalopram (LEXAPRO) 10 MG tablet Take 10 mg by mouth daily.   . hydrOXYzine (ATARAX/VISTARIL) 10 MG tablet Take 1 tablet (10 mg total) by mouth 3 (three) times daily as needed.  [name] ibuprofen (ADVIL,MOTRIN) 600 MG tablet Take 600 mg by mouth every 6 (six) hours as needed.   . lamoTRIgine (LAMICTAL) 200 MG tablet Take 200 mg by  mouth daily.   . metoprolol tartrate (LOPRESSOR) 25 MG tablet TAKE 1 TABLET TWICE DAILY  . montelukast (SINGULAIR) 10 MG tablet Take 1 tablet (10 mg total) by mouth daily.  . nitroGLYCERIN (NITROSTAT) 0.4 MG SL tablet Place 1 tablet (0.4 mg total) under the tongue every 5 (five) minutes as needed for chest pain.  . Tamsulosin HCl (FLOMAX) 0.4 MG CAPS 0.4 mg daily. Reported on 01/08/2016  . VENTOLIN HFA 108 (90 BASE) MCG/ACT inhaler INHALE 2 PUFFS INTO THE LUNGS EVERY 6 (SIX) HOURS AS  NEEDED FOR WHEEZI NG OR SHORTNESS OF BREATH.   No facility-administered encounter medications on file as of 03/27/2016.      Review of Systems  Constitutional: Negative.   Respiratory: Negative.   Cardiovascular: Negative.   Musculoskeletal: Negative.   Skin: Positive for wound.  Psychiatric/Behavioral: Negative.        Objective:   Physical Exam  Skin:  Lesion on the left wrist was isolated with the targeting mechanism and frozen with liquid nitrogen it was allowed to thaw and refrozen. Patient was instructed to observe local skin care and return in 2 weeks for possible retreatment          Assessment & Plan:

## 2016-04-09 ENCOUNTER — Other Ambulatory Visit: Payer: Self-pay | Admitting: Cardiology

## 2016-04-11 ENCOUNTER — Encounter: Payer: Self-pay | Admitting: Family Medicine

## 2016-04-11 ENCOUNTER — Ambulatory Visit (INDEPENDENT_AMBULATORY_CARE_PROVIDER_SITE_OTHER): Payer: Commercial Managed Care - HMO | Admitting: Family Medicine

## 2016-04-11 VITALS — BP 121/67 | HR 70 | Temp 97.6°F | Ht 69.0 in | Wt 183.0 lb

## 2016-04-11 DIAGNOSIS — B079 Viral wart, unspecified: Secondary | ICD-10-CM | POA: Diagnosis not present

## 2016-04-11 NOTE — Progress Notes (Signed)
   Subjective:    Patient ID: Derrick [name], male    DOB: 1956/01/09, 60 y.o.   MRN: [id number]  HPI Derrick [name] is here for recheck of the wart like lesion on the dorsum of his left wrist. Since initial treatment 2 weeks ago the lesion is much smaller. He is pleased with the results at this point. There was no drainage but it did blister.  Patient Active Problem List   Diagnosis Date Noted  . OSA (obstructive sleep apnea) 03/22/2013  . Memory loss 02/05/2013  . Low back pain 02/05/2013  . Unspecified hereditary and idiopathic peripheral neuropathy 02/05/2013  . Essential and other specified forms of tremor 02/05/2013  . Parasomnia 02/05/2013  . Obstructive sleep apnea 02/05/2013  . Dyspnea 09/20/2012  . MITRAL STENOSIS/ INSUFFICIENCY, NON-RHEUMATIC 01/06/2011  . HYPERCHOLESTEROLEMIA 10/05/2009  . Bipolar disorder ([place]) 10/05/2009  . ANXIETY DISORDER 10/05/2009  . TOBACCO ABUSE 10/05/2009  . CAD 10/05/2009   Outpatient Encounter Prescriptions as of 04/11/2016  Medication Sig  . acetaminophen (TYLENOL) 500 MG tablet Take 500 mg by mouth every 6 (six) hours as needed for pain.  [name] aspirin 81 MG tablet Take 81 mg by mouth daily.  [name] atorvastatin (LIPITOR) 80 MG tablet Take 1 tablet (80 mg total) by mouth daily.  [name] buPROPion (WELLBUTRIN XL) 150 MG 24 hr tablet Take 150 mg by mouth every morning.   . divalproex (DEPAKOTE ER) 500 MG 24 hr tablet Take 500 mg by mouth daily.   [name] escitalopram (LEXAPRO) 10 MG tablet Take 10 mg by mouth daily.   . hydrOXYzine (ATARAX/VISTARIL) 10 MG tablet Take 1 tablet (10 mg total) by mouth 3 (three) times daily as needed.  [name] ibuprofen (ADVIL,MOTRIN) 600 MG tablet Take 600 mg by mouth every 6 (six) hours as needed.   . lamoTRIgine (LAMICTAL) 200 MG tablet Take 200 mg by mouth daily.   . metoprolol tartrate (LOPRESSOR) 25 MG tablet TAKE 1 TABLET TWICE DAILY (NEED MD APPOINTMENT)  . montelukast (SINGULAIR) 10 MG tablet Take 1 tablet (10 mg total) by mouth daily.  .  nitroGLYCERIN (NITROSTAT) 0.4 MG SL tablet Place 1 tablet (0.4 mg total) under the tongue every 5 (five) minutes as needed for chest pain.  . Tamsulosin HCl (FLOMAX) 0.4 MG CAPS 0.4 mg daily. Reported on 01/08/2016  . VENTOLIN HFA 108 (90 BASE) MCG/ACT inhaler INHALE 2 PUFFS INTO THE LUNGS EVERY 6 (SIX) HOURS AS  NEEDED FOR WHEEZI NG OR SHORTNESS OF BREATH.   No facility-administered encounter medications on file as of 04/11/2016.      Review of Systems  Skin: Positive for wound.       Objective:   Physical Exam  Skin:  Wartlike lesion on her left wrist again frozen with liquid nitrogen. Debated about refreezing today or waiting but since he is here 1 and froze but not as deeply as the first time. I would now like to give it one month and have him come back for reassessment.          Assessment & Plan:  1. Cutaneous wart Lesion was refrozen. Recheck in one month or sooner as needed  Derrick Honour MD

## 2016-04-21 DIAGNOSIS — H11002 Unspecified pterygium of left eye: Secondary | ICD-10-CM | POA: Diagnosis not present

## 2016-04-21 DIAGNOSIS — H52223 Regular astigmatism, bilateral: Secondary | ICD-10-CM | POA: Diagnosis not present

## 2016-04-21 DIAGNOSIS — H524 Presbyopia: Secondary | ICD-10-CM | POA: Diagnosis not present

## 2016-04-21 DIAGNOSIS — F3175 Bipolar disorder, in partial remission, most recent episode depressed: Secondary | ICD-10-CM | POA: Diagnosis not present

## 2016-04-21 DIAGNOSIS — H5213 Myopia, bilateral: Secondary | ICD-10-CM | POA: Diagnosis not present

## 2016-04-30 DIAGNOSIS — F3175 Bipolar disorder, in partial remission, most recent episode depressed: Secondary | ICD-10-CM | POA: Diagnosis not present

## 2016-05-19 DIAGNOSIS — F3175 Bipolar disorder, in partial remission, most recent episode depressed: Secondary | ICD-10-CM | POA: Diagnosis not present

## 2016-05-26 ENCOUNTER — Other Ambulatory Visit: Payer: Self-pay | Admitting: Family Medicine

## 2016-06-10 ENCOUNTER — Telehealth: Payer: Self-pay | Admitting: Family Medicine

## 2016-06-10 NOTE — Telephone Encounter (Signed)
Question answered. 

## 2016-06-11 ENCOUNTER — Other Ambulatory Visit: Payer: Self-pay | Admitting: *Deleted

## 2016-06-11 MED ORDER — TAMSULOSIN HCL 0.4 MG PO CAPS: 0.4000 mg | ORAL_CAPSULE | Freq: Every day | ORAL | Status: DC

## 2016-06-16 DIAGNOSIS — F3175 Bipolar disorder, in partial remission, most recent episode depressed: Secondary | ICD-10-CM | POA: Diagnosis not present

## 2016-06-20 ENCOUNTER — Telehealth: Payer: Self-pay | Admitting: Family Medicine

## 2016-06-20 NOTE — Telephone Encounter (Signed)
[facility] to x-ray 

## 2016-06-23 ENCOUNTER — Other Ambulatory Visit: Payer: Self-pay | Admitting: Family Medicine

## 2016-06-24 NOTE — Telephone Encounter (Signed)
Last seen 04/11/16  Last lipid 01/09/16  Requesting 90 day supply

## 2016-07-07 DIAGNOSIS — H2513 Age-related nuclear cataract, bilateral: Secondary | ICD-10-CM | POA: Diagnosis not present

## 2016-07-07 DIAGNOSIS — H11002 Unspecified pterygium of left eye: Secondary | ICD-10-CM | POA: Diagnosis not present

## 2016-07-07 DIAGNOSIS — H527 Unspecified disorder of refraction: Secondary | ICD-10-CM | POA: Diagnosis not present

## 2016-07-25 ENCOUNTER — Ambulatory Visit (INDEPENDENT_AMBULATORY_CARE_PROVIDER_SITE_OTHER): Payer: Commercial Managed Care - HMO | Admitting: Family Medicine

## 2016-07-25 ENCOUNTER — Other Ambulatory Visit: Payer: Self-pay | Admitting: *Deleted

## 2016-07-25 ENCOUNTER — Encounter: Payer: Self-pay | Admitting: Family Medicine

## 2016-07-25 VITALS — BP 99/69 | HR 69 | Temp 97.6°F | Ht 69.0 in | Wt 182.4 lb

## 2016-07-25 DIAGNOSIS — M545 Low back pain, unspecified: Secondary | ICD-10-CM

## 2016-07-25 MED ORDER — CYCLOBENZAPRINE HCL 10 MG PO TABS: 10.0000 mg | ORAL_TABLET | Freq: Three times a day (TID) | ORAL | 0 refills | Status: DC | PRN

## 2016-07-25 MED ORDER — CYCLOBENZAPRINE HCL 10 MG PO TABS
10.0000 mg | ORAL_TABLET | Freq: Three times a day (TID) | ORAL | 0 refills | Status: DC | PRN
Start: 2016-07-25 — End: 2016-12-16

## 2016-07-25 NOTE — Progress Notes (Signed)
BP 99/69 (BP Location: Left Arm, Patient Position: Sitting, Cuff Size: Normal)   Pulse 69   Temp 97.6 F (36.4 C) (Oral)   Ht 5\' 9"  (1.753 m)   Wt 182 lb 6.4 oz (82.7 kg)   SpO2 97%   BMI 26.94 kg/m    Subjective:    Patient ID: Derrick [name], male    DOB: 09/07/1956, 60 y.o.   MRN: [id number]  HPI: Derrick [name] is a 60 y.o. male presenting on 07/25/2016 for Back Pain (x 1 mth, NKI)   HPI Low back pain Patient has been having bilateral low back pain. He denies having any tingling or numbness shooting down either leg. The low back pain is mostly in the musculature of his lumbar region. He denies any fevers or chills or overlying skin changes or redness or warmth. He has had this previously and now it is been going on for about the past 2 months. He says it hurts most when he is trying to bend over and straightening up.  Relevant past medical, surgical, family and social history reviewed and updated as indicated. Interim medical history since our last visit reviewed. Allergies and medications reviewed and updated.  Review of Systems  Constitutional: Negative for fever.  HENT: Negative for ear discharge and ear pain.   Eyes: Negative for discharge and visual disturbance.  Respiratory: Negative for shortness of breath and wheezing.   Cardiovascular: Negative for chest pain and leg swelling.  Gastrointestinal: Negative for abdominal pain, constipation and diarrhea.  Genitourinary: Negative for difficulty urinating.  Musculoskeletal: Positive for back pain and myalgias. Negative for arthralgias and gait problem.  Skin: Negative for rash.  Neurological: Negative for syncope, light-headedness and headaches.  All other systems reviewed and are negative.   Per HPI unless specifically indicated above     Medication List       Accurate as of 07/25/16  1:37 PM. Always use your most recent med list.          acetaminophen 500 MG tablet Commonly known as:  TYLENOL Take 500 mg  by mouth every 6 (six) hours as needed for pain.   aspirin 81 MG tablet Take 81 mg by mouth daily.   atorvastatin 80 MG tablet Commonly known as:  LIPITOR TAKE 1 TABLET  DAILY (NEED APPOINTMENT FOR FUTURE REFILLS, CALL OFFICE AT [phone] TO SCHEDULE APPOINTMENT)   buPROPion 150 MG 24 hr tablet Commonly known as:  WELLBUTRIN XL Take 150 mg by mouth every morning.   cyclobenzaprine 10 MG tablet Commonly known as:  FLEXERIL Take 1 tablet (10 mg total) by mouth 3 (three) times daily as needed for muscle spasms.   divalproex 500 MG 24 hr tablet Commonly known as:  DEPAKOTE ER Take 500 mg by mouth daily.   escitalopram 10 MG tablet Commonly known as:  LEXAPRO Take 10 mg by mouth daily.   hydrOXYzine 10 MG tablet Commonly known as:  ATARAX/VISTARIL Take 1 tablet (10 mg total) by mouth 3 (three) times daily as needed.   ibuprofen 600 MG tablet Commonly known as:  ADVIL,MOTRIN Take 600 mg by mouth every 6 (six) hours as needed.   lamoTRIgine 200 MG tablet Commonly known as:  LAMICTAL Take 200 mg by mouth daily.   metoprolol tartrate 25 MG tablet Commonly known as:  LOPRESSOR TAKE 1 TABLET TWICE DAILY (NEED MD APPOINTMENT)   montelukast 10 MG tablet Commonly known as:  SINGULAIR TAKE 1 TABLET EVERY DAY   nitroGLYCERIN 0.4 MG  SL tablet Commonly known as:  NITROSTAT Place 1 tablet (0.4 mg total) under the tongue every 5 (five) minutes as needed for chest pain.   tamsulosin 0.4 MG Caps capsule Commonly known as:  FLOMAX Take 1 capsule (0.4 mg total) by mouth daily. Reported on 01/08/2016   VENTOLIN HFA 108 (90 Base) MCG/ACT inhaler Generic drug:  albuterol INHALE 2 PUFFS INTO THE LUNGS EVERY 6 (SIX) HOURS AS  NEEDED FOR WHEEZI NG OR SHORTNESS OF BREATH.          Objective:    BP 99/69 (BP Location: Left Arm, Patient Position: Sitting, Cuff Size: Normal)   Pulse 69   Temp 97.6 F (36.4 C) (Oral)   Ht 5\' 9"  (1.753 m)   Wt 182 lb 6.4 oz (82.7 kg)   SpO2 97%    BMI 26.94 kg/m   Wt Readings from Last 3 Encounters:  07/25/16 182 lb 6.4 oz (82.7 kg)  04/11/16 183 lb (83 kg)  03/27/16 183 lb 9.6 oz (83.3 kg)    Physical Exam  Constitutional: He is oriented to person, place, and time. He appears well-developed and well-nourished. No distress.  Eyes: Conjunctivae and EOM are normal. Pupils are equal, round, and reactive to light. Right eye exhibits no discharge. No scleral icterus.  Neck: Neck supple. No thyromegaly present.  Cardiovascular: Normal rate, regular rhythm, normal heart sounds and intact distal pulses.   No murmur heard. Pulmonary/Chest: Effort normal and breath sounds normal. No respiratory distress. He has no wheezes.  Musculoskeletal: Normal range of motion. He exhibits no edema.       Lumbar back: He exhibits tenderness (Bilateral paraspinal tenderness, negative straight leg raise). He exhibits normal range of motion, no bony tenderness and no swelling.  Lymphadenopathy:    He has no cervical adenopathy.  Neurological: He is alert and oriented to person, place, and time. Coordination normal.  Skin: Skin is warm and dry. No rash noted. He is not diaphoretic.  Psychiatric: He has a normal mood and affect. His behavior is normal.  Nursing note and vitals reviewed.     Assessment & Plan:   Problem List Items Addressed This Visit      Other   Low back pain - Primary    Likely muscular in origin, will give information on stretching and tennis ball massage and a muscle relaxer and use heating pad as well. If not improved will send to physical therapy      Relevant Medications   cyclobenzaprine (FLEXERIL) 10 MG tablet    Other Visit Diagnoses   None.      Follow up plan: Return if symptoms worsen or fail to improve.  Counseling provided for all of the vaccine components No orders of the defined types were placed in this encounter.   Caryl Pina, MD [facility] 07/25/2016, 1:37 PM

## 2016-07-25 NOTE — Assessment & Plan Note (Signed)
Likely muscular in origin, will give information on stretching and tennis ball massage and a muscle relaxer and use heating pad as well. If not improved will send to physical therapy

## 2016-08-01 ENCOUNTER — Ambulatory Visit: Payer: Commercial Managed Care - HMO | Admitting: Family Medicine

## 2016-08-12 ENCOUNTER — Other Ambulatory Visit: Payer: Self-pay | Admitting: Family Medicine

## 2016-09-08 DIAGNOSIS — F3175 Bipolar disorder, in partial remission, most recent episode depressed: Secondary | ICD-10-CM | POA: Diagnosis not present

## 2016-10-14 ENCOUNTER — Ambulatory Visit: Payer: Commercial Managed Care - HMO

## 2016-10-15 ENCOUNTER — Other Ambulatory Visit: Payer: Self-pay | Admitting: Family Medicine

## 2016-10-16 ENCOUNTER — Ambulatory Visit (INDEPENDENT_AMBULATORY_CARE_PROVIDER_SITE_OTHER): Payer: Commercial Managed Care - HMO

## 2016-10-16 DIAGNOSIS — Z23 Encounter for immunization: Secondary | ICD-10-CM

## 2016-10-27 DIAGNOSIS — F3175 Bipolar disorder, in partial remission, most recent episode depressed: Secondary | ICD-10-CM | POA: Diagnosis not present

## 2016-11-27 DIAGNOSIS — H11002 Unspecified pterygium of left eye: Secondary | ICD-10-CM | POA: Diagnosis not present

## 2016-11-27 DIAGNOSIS — Z955 Presence of coronary angioplasty implant and graft: Secondary | ICD-10-CM | POA: Diagnosis not present

## 2016-11-27 DIAGNOSIS — I252 Old myocardial infarction: Secondary | ICD-10-CM | POA: Diagnosis not present

## 2016-11-27 DIAGNOSIS — J449 Chronic obstructive pulmonary disease, unspecified: Secondary | ICD-10-CM | POA: Diagnosis not present

## 2016-11-27 DIAGNOSIS — I10 Essential (primary) hypertension: Secondary | ICD-10-CM | POA: Diagnosis not present

## 2016-11-27 DIAGNOSIS — Z87891 Personal history of nicotine dependence: Secondary | ICD-10-CM | POA: Diagnosis not present

## 2016-11-27 DIAGNOSIS — H2513 Age-related nuclear cataract, bilateral: Secondary | ICD-10-CM | POA: Diagnosis not present

## 2016-11-27 DIAGNOSIS — F329 Major depressive disorder, single episode, unspecified: Secondary | ICD-10-CM | POA: Diagnosis not present

## 2016-11-27 DIAGNOSIS — E785 Hyperlipidemia, unspecified: Secondary | ICD-10-CM | POA: Diagnosis not present

## 2016-11-28 DIAGNOSIS — H527 Unspecified disorder of refraction: Secondary | ICD-10-CM | POA: Diagnosis not present

## 2016-11-28 DIAGNOSIS — H11002 Unspecified pterygium of left eye: Secondary | ICD-10-CM | POA: Diagnosis not present

## 2016-11-28 DIAGNOSIS — H2513 Age-related nuclear cataract, bilateral: Secondary | ICD-10-CM | POA: Diagnosis not present

## 2016-12-03 ENCOUNTER — Telehealth: Payer: Self-pay

## 2016-12-03 DIAGNOSIS — E7800 Pure hypercholesterolemia, unspecified: Secondary | ICD-10-CM

## 2016-12-03 DIAGNOSIS — I25810 Atherosclerosis of coronary artery bypass graft(s) without angina pectoris: Secondary | ICD-10-CM

## 2016-12-04 NOTE — Addendum Note (Signed)
Addended by: Zannie Cove on: 12/04/2016 08:11 AM   Modules accepted: Orders

## 2016-12-04 NOTE — Telephone Encounter (Signed)
Okay to refer per pt request

## 2016-12-04 NOTE — Telephone Encounter (Signed)
Has appt 12/16/16 -= just needed referral

## 2016-12-05 DIAGNOSIS — H527 Unspecified disorder of refraction: Secondary | ICD-10-CM | POA: Diagnosis not present

## 2016-12-05 DIAGNOSIS — H11002 Unspecified pterygium of left eye: Secondary | ICD-10-CM | POA: Diagnosis not present

## 2016-12-05 DIAGNOSIS — H2513 Age-related nuclear cataract, bilateral: Secondary | ICD-10-CM | POA: Diagnosis not present

## 2016-12-10 DIAGNOSIS — F3175 Bipolar disorder, in partial remission, most recent episode depressed: Secondary | ICD-10-CM | POA: Diagnosis not present

## 2016-12-16 ENCOUNTER — Telehealth: Payer: Self-pay | Admitting: Nurse Practitioner

## 2016-12-16 ENCOUNTER — Telehealth: Payer: Self-pay | Admitting: *Deleted

## 2016-12-16 ENCOUNTER — Encounter: Payer: Self-pay | Admitting: Nurse Practitioner

## 2016-12-16 ENCOUNTER — Ambulatory Visit (INDEPENDENT_AMBULATORY_CARE_PROVIDER_SITE_OTHER): Payer: Commercial Managed Care - HMO | Admitting: Nurse Practitioner

## 2016-12-16 VITALS — BP 120/90 | HR 96 | Ht 69.0 in | Wt 186.0 lb

## 2016-12-16 DIAGNOSIS — E7800 Pure hypercholesterolemia, unspecified: Secondary | ICD-10-CM | POA: Diagnosis not present

## 2016-12-16 DIAGNOSIS — F3175 Bipolar disorder, in partial remission, most recent episode depressed: Secondary | ICD-10-CM | POA: Diagnosis not present

## 2016-12-16 DIAGNOSIS — I259 Chronic ischemic heart disease, unspecified: Secondary | ICD-10-CM | POA: Diagnosis not present

## 2016-12-16 MED ORDER — METOPROLOL TARTRATE 25 MG PO TABS: ORAL_TABLET | ORAL | 3 refills | Status: DC

## 2016-12-16 MED ORDER — METOPROLOL TARTRATE 25 MG PO TABS: ORAL_TABLET | ORAL | 6 refills | Status: DC

## 2016-12-16 NOTE — Patient Instructions (Addendum)
We will be checking the following labs today - NONE  We will do fasting labs on the day of your echocardiogram  Medication Instructions:    Continue with your current medicines.   Let's get back on Metoprolol  I sent in your refill for your Metoprolol to both your local drug store and your mail order.     Testing/Procedures To Be Arranged:  Echocardiogram  Follow-Up:   See me in one year.     Other Special Instructions:   If you start having any palpitations, dizzy or lightheadedness - let me know    If you need a refill on your cardiac medications before your next appointment, please call your pharmacy.   Call the [place] office at [phone] if you have any questions, problems or concerns.

## 2016-12-16 NOTE — Progress Notes (Signed)
CARDIOLOGY OFFICE NOTE  Date:  12/16/2016    Derrick [name] Date of Birth: 12/08/1956 Medical Record [id number]  PCP:  Wardell Honour, MD  Cardiologist:  Annabell Howells    Chief Complaint  Patient presents with  . Coronary Artery Disease  . Cardiac Valve Problem    2 year check - seen for Dr. [name]    History of Present Illness: Derrick [name] is a 61 y.o. male who presents today for a 2 year check. Seen for Dr. [name].   He has a history of CAD s/p inferior STEMI with DES x 2 to RCA in 2004 and moderate MR with mitral valve prolapse.  His other issues include tobacco abuse, bipolar disorder, OSA and HLD.  Last echo from 10/2014 with EF of 45 to 50%, moderate LVH, MVP with mild to moderate MR.  Seen for first time by Dr. [name] back in 2014. Cardiac status appeared stable but was short of breath - ordered a Myoview, PFTs and BNP. BNP was 18. Low risk Myoview noted. PFTs without marked abnormality.  I saw him 2 years ago - was felt to be ok.   Comes back today. Here alone. He says he is doing well. No chest pain. Not short of breath. Had recent left eye surgery - was told then that he had a "skip" in his heart. He has no palpitations whatsoever. Not dizzy. No passing out. Exercising regularly without issue. No lab since June. He has been out of his metoprolol at least 6 months - would like to restart. He feels like he is doing ok but wishes he was on less medicine. He is not fasting today. Has regular labs for his liver, hepatitis, etc due to his psych drugs he is on.    PMH: 1. Bipolar disorder 2. OSA 3. CAD: STEMI 2004 with 2 overlapping Cypher stents to RCA.  4. Hyperlipidemia 5. Mitral regurgitation: Echo (10/14) with EF 55-60%, mild LVH, MV prolapse with moderate MR.   Past Medical History:  Diagnosis Date  . Anxiety   . Bipolar 1 disorder ([place])   . BPH (benign prostatic hypertrophy)   . Coronary artery disease    DES x 2 to RCA in 2004  . Depression    . Mitral regurgitation and aortic stenosis   . MVP (mitral valve prolapse)   . OSA (obstructive sleep apnea) 03/22/2013    History reviewed. No pertinent surgical history.   Medications: Current Outpatient Prescriptions  Medication Sig Dispense Refill  . acetaminophen (TYLENOL) 500 MG tablet Take 500 mg by mouth every 6 (six) hours as needed for pain.    [name] aspirin 81 MG tablet Take 81 mg by mouth daily.    [name] atorvastatin (LIPITOR) 80 MG tablet TAKE 1 TABLET  DAILY (NEED APPOINTMENT FOR FUTURE REFILLS, CALL OFFICE AT [phone] TO SCHEDULE APPOINTMENT) 90 tablet 0  . buPROPion (WELLBUTRIN XL) 150 MG 24 hr tablet Take 150 mg by mouth every morning.     . divalproex (DEPAKOTE ER) 500 MG 24 hr tablet Take 500 mg by mouth daily.     [name] escitalopram (LEXAPRO) 10 MG tablet Take 10 mg by mouth daily.     . hydrOXYzine (ATARAX/VISTARIL) 10 MG tablet Take 1 tablet (10 mg total) by mouth 3 (three) times daily as needed. 30 tablet 0  . ibuprofen (ADVIL,MOTRIN) 600 MG tablet Take 600 mg by mouth every 6 (six) hours as needed.     . lamoTRIgine (LAMICTAL)  200 MG tablet Take 200 mg by mouth daily.     . metoprolol tartrate (LOPRESSOR) 25 MG tablet TAKE 1 TABLET TWICE DAILY 30 tablet 6  . montelukast (SINGULAIR) 10 MG tablet TAKE 1 TABLET EVERY DAY 90 tablet 0  . nitroGLYCERIN (NITROSTAT) 0.4 MG SL tablet Place 1 tablet (0.4 mg total) under the tongue every 5 (five) minutes as needed for chest pain. 25 tablet 3  . tamsulosin (FLOMAX) 0.4 MG CAPS capsule TAKE 1 CAPSULE EVERY DAY 90 capsule 0  . VENTOLIN HFA 108 (90 BASE) MCG/ACT inhaler INHALE 2 PUFFS INTO THE LUNGS EVERY 6 (SIX) HOURS AS  NEEDED FOR WHEEZI NG OR SHORTNESS OF BREATH. 18 g 4   No current facility-administered medications for this visit.     Allergies: No Known Allergies  Social History: The patient  reports that he quit smoking about 13 years ago. His smoking use included Cigarettes. He has never used smokeless tobacco. He reports  that he does not drink alcohol or use drugs.   Family History: The patient's family history is not on file. Father has had COPD.   Review of Systems: Please see the history of present illness.   Otherwise, the review of systems is positive for none.   All other systems are reviewed and negative.   Physical Exam: VS:  BP 120/90   Pulse 96   Ht 5\' 9"  (1.753 m)   Wt 186 lb (84.4 kg)   BMI 27.47 kg/m  .  BMI Body mass index is 27.47 kg/m.  Wt Readings from Last 3 Encounters:  12/16/16 186 lb (84.4 kg)  07/25/16 182 lb 6.4 oz (82.7 kg)  04/11/16 183 lb (83 kg)    General: Pleasant. Affect is a little flat. He is alert and in no acute distress.   HEENT: Normal.  Neck: Supple, no JVD, carotid bruits, or masses noted.  Cardiac: Regular rate and rhythm. Systolic murmur noted. No edema.  Respiratory:  Lungs are clear to auscultation bilaterally with normal work of breathing.  GI: Soft and nontender.  MS: No deformity or atrophy. Gait and ROM intact.  Skin: Warm and dry. Color is normal.  Neuro:  Strength and sensation are intact and no gross focal deficits noted.  Psych: Alert, appropriate and with normal affect.   LABORATORY DATA:  EKG:  EKG is ordered today. This demonstrates NSR with prior inferior infarct, PVCs noted.  Lab Results  Component Value Date   WBC 5.2 01/18/2016   HGB 13.3 11/29/2014   HCT 42.7 01/18/2016   PLT 174 01/18/2016   GLUCOSE 107 (H) 01/09/2016   CHOL 140 01/09/2016   TRIG 98 01/09/2016   HDL 41 01/09/2016   LDLCALC 79 01/09/2016   ALT 37 01/18/2016   AST 29 01/18/2016   NA 143 01/09/2016   K 4.3 01/09/2016   CL 102 01/09/2016   CREATININE 0.92 01/09/2016   BUN 9 01/09/2016   CO2 23 01/09/2016   TSH 3.360 07/19/2014    BNP (last 3 results) No results for input(s): BNP in the last 8760 hours.  ProBNP (last 3 results) No results for input(s): PROBNP in the last 8760 hours.   Other Studies Reviewed Today:  Echo Study Conclusions from  10/2014  - Left ventricle: The cavity size was normal. Wall thickness was increased in a pattern of moderate LVH. Systolic function was mildly reduced. The estimated ejection fraction was in the range of 45% to 50%. Diffuse hypokinesis. Doppler parameters are consistent with abnormal  left ventricular relaxation (grade 1 diastolic dysfunction). - Mitral valve: There was mild to moderate regurgitation. - Left atrium: The atrium was mildly dilated.  Myoview Impression 10/2013 Exercise Capacity: Good exercise capacity. BP Response: Normal blood pressure response. Clinical Symptoms: No significant symptoms noted. ECG Impression: No significant ST segment change suggestive of ischemia. Comparison with Prior Nuclear Study: No images to compare  Overall Impression: Low risk stress nuclear study Small inferolateral wall infarct at mid and basal level with no ischemia.  LV Ejection Fraction: 48%. LV Wall Motion: Mildly decreased LV function with inferior lateral wall hypokinesis  [name]  Overall low risk study. Mildly decreased EF. Small area of infarction with no ischemia. Please inform patient.   [name] 11/04/2013  Assessment / Plan: 1. Exertional dyspnea -  This is resolved and currently not an issue.   2. CAD: no symptoms reported. Continue with current regimen. He has good exercise tolerance.   3. Smoking: says he is not smoking  4. Mitral regurgitation: Moderate, from mitral valve prolapse. Needs echo repeated.   5. Hyperlipidemia: remains on statin - not fasting today - will get when he gets his echo.   6. PVCs on EKG - totally asymptomatic - restarting beta blocker. Updating his echo.   7. Psyche disorder.   Current medicines are reviewed with the patient today.  The patient does not have concerns regarding medicines other than what has been noted above.  The following changes have been made:  See above.  Labs/ tests ordered  today include:    Orders Placed This Encounter  Procedures  . Basic metabolic panel  . Hepatic function panel  . Lipid panel  . EKG 12-Lead  . ECHOCARDIOGRAM COMPLETE     Disposition:   FU with me in one year. Will get his echo updated. Lab at time of echo.   Patient is agreeable to this plan and will call if any problems develop in the interim.   Signed: Burtis Junes, RN, ANP-C 12/16/2016 8:44 AM  [facility] [address] [place], [place]  [address] Phone: [phone] Fax: [phone]

## 2016-12-16 NOTE — Telephone Encounter (Signed)
[facility]( [facility]) is calling ion reference to the Metoprolol Tartrate 25mg  , the prescription was sent over for 30 tablets and the patient takes it twice a day , and wants to know if they can refill it for 60 days so that he can have a month worth . Thanks

## 2016-12-16 NOTE — Telephone Encounter (Signed)
error 

## 2016-12-19 DIAGNOSIS — H11002 Unspecified pterygium of left eye: Secondary | ICD-10-CM | POA: Diagnosis not present

## 2016-12-19 DIAGNOSIS — H2513 Age-related nuclear cataract, bilateral: Secondary | ICD-10-CM | POA: Diagnosis not present

## 2016-12-19 DIAGNOSIS — H527 Unspecified disorder of refraction: Secondary | ICD-10-CM | POA: Diagnosis not present

## 2016-12-31 NOTE — CV Procedure (Signed)
lmom about office closure tomorrow

## 2017-01-01 ENCOUNTER — Other Ambulatory Visit: Payer: Commercial Managed Care - HMO

## 2017-01-01 ENCOUNTER — Other Ambulatory Visit (HOSPITAL_COMMUNITY): Payer: Commercial Managed Care - HMO

## 2017-01-02 ENCOUNTER — Other Ambulatory Visit: Payer: Self-pay | Admitting: *Deleted

## 2017-01-02 MED ORDER — METOPROLOL TARTRATE 25 MG PO TABS: ORAL_TABLET | ORAL | 11 refills | Status: DC

## 2017-01-12 ENCOUNTER — Other Ambulatory Visit: Payer: Medicare HMO | Admitting: *Deleted

## 2017-01-12 ENCOUNTER — Other Ambulatory Visit: Payer: Self-pay

## 2017-01-12 ENCOUNTER — Ambulatory Visit (HOSPITAL_COMMUNITY): Payer: Medicare HMO | Attending: Cardiology

## 2017-01-12 DIAGNOSIS — E7800 Pure hypercholesterolemia, unspecified: Secondary | ICD-10-CM | POA: Insufficient documentation

## 2017-01-12 DIAGNOSIS — M5440 Lumbago with sciatica, unspecified side: Secondary | ICD-10-CM | POA: Diagnosis not present

## 2017-01-12 DIAGNOSIS — I259 Chronic ischemic heart disease, unspecified: Secondary | ICD-10-CM | POA: Diagnosis not present

## 2017-01-12 DIAGNOSIS — G4733 Obstructive sleep apnea (adult) (pediatric): Secondary | ICD-10-CM | POA: Diagnosis not present

## 2017-01-12 DIAGNOSIS — I2510 Atherosclerotic heart disease of native coronary artery without angina pectoris: Secondary | ICD-10-CM | POA: Diagnosis not present

## 2017-01-13 LAB — BASIC METABOLIC PANEL
BUN/Creatinine Ratio: 10 (ref 9–20)
BUN: 9 mg/dL (ref 6–24)
CO2: 25 mmol/L (ref 18–29)
Calcium: 9.2 mg/dL (ref 8.7–10.2)
Chloride: 101 mmol/L (ref 96–106)
Creatinine, Ser: 0.89 mg/dL (ref 0.76–1.27)
GFR calc Af Amer: 108 mL/min/{1.73_m2} (ref >59)
GFR calc non Af Amer: 94 mL/min/{1.73_m2} (ref >59)
Glucose: 98 mg/dL (ref 65–99)
Potassium: 4.2 mmol/L (ref 3.5–5.2)
Sodium: 142 mmol/L (ref 134–144)

## 2017-01-13 LAB — HEPATIC FUNCTION PANEL
ALT: 18 IU/L (ref 0–44)
AST: 15 IU/L (ref 0–40)
Albumin: 4.1 g/dL (ref 3.5–5.5)
Alkaline Phosphatase: 52 IU/L (ref 39–117)
Bilirubin Total: 0.2 mg/dL (ref 0.0–1.2)
Bilirubin, Direct: 0.07 mg/dL (ref 0.00–0.40)
Total Protein: 5.9 g/dL — ABNORMAL LOW (ref 6.0–8.5)

## 2017-01-13 LAB — LIPID PANEL
Chol/HDL Ratio: 4 ratio units (ref 0.0–5.0)
Cholesterol, Total: 144 mg/dL (ref 100–199)
HDL: 36 mg/dL — ABNORMAL LOW (ref >39)
LDL Calculated: 36 mg/dL (ref 0–99)
Triglycerides: 361 mg/dL — ABNORMAL HIGH (ref 0–149)
VLDL Cholesterol Cal: 72 mg/dL — ABNORMAL HIGH (ref 5–40)

## 2017-01-14 DIAGNOSIS — F3175 Bipolar disorder, in partial remission, most recent episode depressed: Secondary | ICD-10-CM | POA: Diagnosis not present

## 2017-01-15 ENCOUNTER — Other Ambulatory Visit: Payer: Self-pay | Admitting: *Deleted

## 2017-01-15 ENCOUNTER — Telehealth (HOSPITAL_COMMUNITY): Payer: Self-pay | Admitting: *Deleted

## 2017-01-15 DIAGNOSIS — R9430 Abnormal result of cardiovascular function study, unspecified: Secondary | ICD-10-CM

## 2017-01-15 NOTE — Telephone Encounter (Signed)
Patient given detailed instructions per Myocardial Perfusion Study Information Sheet for the test on 01/19/17 Patient notified to arrive 15 minutes early and that it is imperative to arrive on time for appointment to keep from having the test rescheduled.  If you need to cancel or reschedule your appointment, please call the office within 24 hours of your appointment. Failure to do so may result in a cancellation of your appointment, and a $50 no show fee. Patient verbalized understanding. Kirstie Peri

## 2017-01-19 ENCOUNTER — Ambulatory Visit (HOSPITAL_COMMUNITY): Payer: Medicare HMO | Attending: Cardiology

## 2017-01-19 ENCOUNTER — Encounter (HOSPITAL_COMMUNITY): Payer: Self-pay

## 2017-01-19 DIAGNOSIS — R0989 Other specified symptoms and signs involving the circulatory and respiratory systems: Secondary | ICD-10-CM | POA: Diagnosis not present

## 2017-01-19 DIAGNOSIS — R9430 Abnormal result of cardiovascular function study, unspecified: Secondary | ICD-10-CM

## 2017-01-19 LAB — MYOCARDIAL PERFUSION IMAGING
LV dias vol: 147 mL (ref 62–150)
LV sys vol: 93 mL
Peak HR: 88 {beats}/min
RATE: 0.17
Rest HR: 61 {beats}/min
SDS: 3
SRS: 7
SSS: 10
TID: 1.02

## 2017-01-19 MED ORDER — REGADENOSON 0.4 MG/5ML IV SOLN
0.4000 mg | Freq: Once | INTRAVENOUS | Status: AC
  Administered 2017-01-19: 0.4 mg via INTRAVENOUS

## 2017-01-19 MED ORDER — TECHNETIUM TC 99M TETROFOSMIN IV KIT
10.4000 | PACK | Freq: Once | INTRAVENOUS | Status: AC | PRN
  Administered 2017-01-19: 10.4 via INTRAVENOUS
  Filled 2017-01-19: qty 11

## 2017-01-19 MED ORDER — TECHNETIUM TC 99M TETROFOSMIN IV KIT
32.7000 | PACK | Freq: Once | INTRAVENOUS | Status: AC | PRN
  Administered 2017-01-19: 32.7 via INTRAVENOUS
  Filled 2017-01-19: qty 33

## 2017-01-30 ENCOUNTER — Encounter: Payer: Self-pay | Admitting: Nurse Practitioner

## 2017-02-09 ENCOUNTER — Encounter: Payer: Self-pay | Admitting: Nurse Practitioner

## 2017-02-09 ENCOUNTER — Ambulatory Visit (INDEPENDENT_AMBULATORY_CARE_PROVIDER_SITE_OTHER): Payer: Medicare HMO | Admitting: Nurse Practitioner

## 2017-02-09 ENCOUNTER — Other Ambulatory Visit: Payer: Self-pay | Admitting: Nurse Practitioner

## 2017-02-09 ENCOUNTER — Ambulatory Visit
Admission: RE | Admit: 2017-02-09 | Discharge: 2017-02-09 | Disposition: A | Discharge status: Home / Self Care | Payer: Medicare HMO | Source: Ambulatory Visit | Attending: Nurse Practitioner | Admitting: Nurse Practitioner

## 2017-02-09 VITALS — BP 100/70 | HR 67 | Ht 69.0 in | Wt 183.4 lb

## 2017-02-09 DIAGNOSIS — E7800 Pure hypercholesterolemia, unspecified: Secondary | ICD-10-CM

## 2017-02-09 DIAGNOSIS — Z01810 Encounter for preprocedural cardiovascular examination: Secondary | ICD-10-CM

## 2017-02-09 DIAGNOSIS — I340 Nonrheumatic mitral (valve) insufficiency: Secondary | ICD-10-CM

## 2017-02-09 DIAGNOSIS — I5022 Chronic systolic (congestive) heart failure: Secondary | ICD-10-CM | POA: Diagnosis not present

## 2017-02-09 DIAGNOSIS — I259 Chronic ischemic heart disease, unspecified: Secondary | ICD-10-CM

## 2017-02-09 DIAGNOSIS — R918 Other nonspecific abnormal finding of lung field: Secondary | ICD-10-CM | POA: Diagnosis not present

## 2017-02-09 LAB — BASIC METABOLIC PANEL
BUN/Creatinine Ratio: 9 (ref 9–20)
BUN: 9 mg/dL (ref 6–24)
CO2: 23 mmol/L (ref 18–29)
Calcium: 9.4 mg/dL (ref 8.7–10.2)
Chloride: 104 mmol/L (ref 96–106)
Creatinine, Ser: 0.99 mg/dL (ref 0.76–1.27)
GFR calc Af Amer: 96 mL/min/{1.73_m2} (ref >59)
GFR calc non Af Amer: 83 mL/min/{1.73_m2} (ref >59)
Glucose: 87 mg/dL (ref 65–99)
Potassium: 4.4 mmol/L (ref 3.5–5.2)
Sodium: 142 mmol/L (ref 134–144)

## 2017-02-09 LAB — CBC
Hematocrit: 40.5 % (ref 37.5–51.0)
Hemoglobin: 13.9 g/dL (ref 13.0–17.7)
MCH: 32.1 pg (ref 26.6–33.0)
MCHC: 34.3 g/dL (ref 31.5–35.7)
MCV: 94 fL (ref 79–97)
Platelets: 180 10*3/uL (ref 150–379)
RBC: 4.33 x10E6/uL (ref 4.14–5.80)
RDW: 13.8 % (ref 12.3–15.4)
WBC: 7.3 10*3/uL (ref 3.4–10.8)

## 2017-02-09 LAB — APTT: aPTT: 27 s (ref 24–33)

## 2017-02-09 LAB — PROTIME-INR
INR: 1 (ref 0.8–1.2)
Prothrombin Time: 10.9 s (ref 9.1–12.0)

## 2017-02-09 MED ORDER — NITROGLYCERIN 0.4 MG SL SUBL: 0.4000 mg | SUBLINGUAL_TABLET | SUBLINGUAL | 3 refills | Status: DC | PRN

## 2017-02-09 NOTE — Progress Notes (Addendum)
CARDIOLOGY OFFICE NOTE  Date:  02/09/2017    Derrick [name] Date of Birth: 1956/09/23 Medical Record [id number]  PCP:  Derrick Honour, MD  Cardiologist:  Derrick [name]    Chief Complaint  Patient presents with  . Coronary Artery Disease    Follow up visit - seen for Dr. Aundra [name]    History of Present Illness: Derrick [name] is a 61 y.o. male who presents today for a follow up visit. Seen for Dr. Aundra [name].   He has a history of CAD s/p inferior STEMI with DES x 2 to RCA in 2004 and moderate MR with mitral valve prolapse.  His other issues include tobacco abuse, bipolar disorder, OSA and HLD.  Last echo from 10/2014 with EF of 45 to 50%, moderate LVH, MVP with mild to moderate MR.  Seen for first time by Dr. Aundra [name] back in 2014. Cardiac status appeared stable but was short of breath - ordered a Myoview, PFTs and BNP. BNP was 18. Low risk Myoview noted. PFTs without marked abnormality.  I have seen him back over the past few years - last seen back in January - he was doing well - had had some eye surgery and was told he had a "skip" in his heart. He was asymptomatic. Had been out of his beta blocker for at least 6 months - I restarted it. Got his echo updated - EF down more - reviewed with Dr. Aundra [name] - set up for Myoview - larger scarnoted. Cardiac cath is now recommended after discussion with Dr. Aundra [name].   Comes back today. Here with his wife. He continues to do well. He has good understanding of why he is here today. No chest pain. Not short of breath. Back on his beta blocker. He is asking whether being off of that has caused these results. Wife notes more fatigue. He does tire easier. No syncope. No palpitations. No recent CXR. Needs labs today.   PMH: 1. Bipolar disorder 2. OSA 3. CAD: STEMI 2004 with 2 overlapping Cypher stents to RCA.  4. Hyperlipidemia 5. Mitral regurgitation: Echo (10/14) with EF 55-60%, mild LVH, MV prolapse with moderate MR.   Comes in  today. Here with   Past Medical History:  Diagnosis Date  . Anxiety   . Bipolar 1 disorder ([place])   . BPH (benign prostatic hypertrophy)   . Coronary artery disease    DES x 2 to RCA in 2004  . Depression   . Mitral regurgitation and aortic stenosis   . MVP (mitral valve prolapse)   . OSA (obstructive sleep apnea) 03/22/2013    No past surgical history on file.   Medications: Current Outpatient Prescriptions  Medication Sig Dispense Refill  . acetaminophen (TYLENOL) 500 MG tablet Take 500 mg by mouth every 6 (six) hours as needed for pain.    [name] aspirin 81 MG tablet Take 81 mg by mouth daily.    [name] atorvastatin (LIPITOR) 80 MG tablet TAKE 1 TABLET  DAILY (NEED APPOINTMENT FOR FUTURE REFILLS, CALL OFFICE AT [phone] TO SCHEDULE APPOINTMENT) 90 tablet 0  . buPROPion (WELLBUTRIN XL) 150 MG 24 hr tablet Take 150 mg by mouth every morning.     . divalproex (DEPAKOTE ER) 500 MG 24 hr tablet Take 500 mg by mouth daily.     [name] escitalopram (LEXAPRO) 10 MG tablet Take 10 mg by mouth daily.     . hydrOXYzine (ATARAX/VISTARIL) 10 MG tablet Take 1 tablet (10 mg total)  by mouth 3 (three) times daily as needed. 30 tablet 0  . ibuprofen (ADVIL,MOTRIN) 600 MG tablet Take 600 mg by mouth every 6 (six) hours as needed.     . lamoTRIgine (LAMICTAL) 200 MG tablet Take 200 mg by mouth daily.     . metoprolol tartrate (LOPRESSOR) 25 MG tablet TAKE 1 TABLET TWICE DAILY 60 tablet 11  . montelukast (SINGULAIR) 10 MG tablet TAKE 1 TABLET EVERY DAY 90 tablet 0  . nitroGLYCERIN (NITROSTAT) 0.4 MG SL tablet Place 1 tablet (0.4 mg total) under the tongue every 5 (five) minutes as needed for chest pain. 25 tablet 3  . tamsulosin (FLOMAX) 0.4 MG CAPS capsule TAKE 1 CAPSULE EVERY DAY 90 capsule 0   No current facility-administered medications for this visit.     Allergies: No Known Allergies  Social History: The patient  reports that he quit smoking about 14 years ago. His smoking use included Cigarettes. He  has never used smokeless tobacco. He reports that he does not drink alcohol or use drugs.   Family History: The patient's family history is not on file.   Review of Systems: Please see the history of present illness.   Otherwise, the review of systems is positive for none.   All other systems are reviewed and negative.   Physical Exam: VS:  BP 100/70   Pulse 67   Ht 5\' 9"  (1.753 m)   Wt 183 lb 6.4 oz (83.2 kg)   SpO2 96% Comment: at rest  BMI 27.08 kg/m  .  BMI Body mass index is 27.08 kg/m.  Wt Readings from Last 3 Encounters:  02/09/17 183 lb 6.4 oz (83.2 kg)  01/19/17 186 lb (84.4 kg)  12/16/16 186 lb (84.4 kg)    General: Pleasant. Well developed, well nourished and in no acute distress.   HEENT: Normal.  Neck: Supple, no JVD, carotid bruits, or masses noted.  Cardiac: Regular rate and rhythm. No murmurs, rubs, or gallops. No edema.  Respiratory:  Lungs are clear to auscultation bilaterally with normal work of breathing.  GI: Soft and nontender.  MS: No deformity or atrophy. Gait and ROM intact.  Skin: Warm and dry. Color is normal.  Neuro:  Strength and sensation are intact and no gross focal deficits noted.  Psych: Alert, appropriate and with normal affect.   LABORATORY DATA:  EKG:  EKG is not ordered today.  Lab Results  Component Value Date   WBC 5.2 01/18/2016   HGB 13.3 11/29/2014   HCT 42.7 01/18/2016   PLT 174 01/18/2016   GLUCOSE 98 01/12/2017   CHOL 144 01/12/2017   TRIG 361 (H) 01/12/2017   HDL 36 (L) 01/12/2017   LDLCALC 36 01/12/2017   ALT 18 01/12/2017   AST 15 01/12/2017   NA 142 01/12/2017   K 4.2 01/12/2017   CL 101 01/12/2017   CREATININE 0.89 01/12/2017   BUN 9 01/12/2017   CO2 25 01/12/2017   TSH 3.360 07/19/2014    BNP (last 3 results) No results for input(s): BNP in the last 8760 hours.  ProBNP (last 3 results) No results for input(s): PROBNP in the last 8760 hours.   Other Studies Reviewed Today:  Myoview Study  Highlights 01/2017    Nuclear stress EF: 37%.  There was no ST segment deviation noted during stress.  Defect 1: There is a large defect of moderate severity present in the basal inferior, basal inferolateral, basal anterolateral, mid inferior, mid inferolateral and apical inferior location.  Findings consistent with prior myocardial infarction with peri-infarct ischemia.  This is an intermediate risk study.  The left ventricular ejection fraction is moderately decreased (30-44%).   There is a prior infarct in the entire inferior and basal inferolateral walls and peri-infarct ischemia in the basal and mid inferolateral and basal anterolateral walls (SDS =4). LVEF is moderately decreased calculated at 37% with akinesis in the entire inferior and basal inferolateral walls.   Notes Recorded by [name], MD on 01/19/2017 at 3:38 PM EST EF down a lot, probably primarily infarct but think he should have cath. I would be glad to do it whenever, just touch base with Kevan Rosebush regarding timing if he is in agreement.  Echo Study Conclusions 12/2016  - Left ventricle: The cavity size was normal. Wall thickness was   increased in a pattern of mild LVH. Systolic function was   moderately reduced. The estimated ejection fraction was in the   range of 35% to 40%. Diffuse hypokinesis. Doppler parameters are   consistent with abnormal left ventricular relaxation (grade 1   diastolic dysfunction). - Mitral valve: Calcified annulus. Mildly thickened leaflets .   Moderate prolapse. There was mild to moderate regurgitation.  Impressions:  - Moderate global reduction in LV systolic function; grade 1   diastolic dysfunction; mild LVH; prolapse of posterior MV   leaflet; mild to moderate MR. Notes Recorded by [name], MD on 01/12/2017 at 10:59 PM EST Would do Cardiolite given fall in EF (no cath if asymptomatic). Would make sure he is on Coreg and ACEI  Assessment / Plan:  1.  Worsening LV function -  Asymptomatic - Proceeding on with cardiac cath to further discern. Scheduled for this Thursday with Dr. Aundra [name]. The patient understands that risks include but are not limited to stroke (1 in 1000), death (1 in 95), kidney failure [usually temporary] (1 in 500), bleeding (1 in 200), allergic reaction [possibly serious] (1 in 200), and agrees to proceed.   2. CAD: no symptoms reported. Recent intermediate study - have reviewed with Dr. Aundra [name] and cardiac cath has been recommended - arranged for later this week. CHF has said to schedule for Thursday, March 1st.   3. Smoking: says he is not smoking  4. Mitral regurgitation: Moderate, from mitral valve prolapse. Recent echo noted.   5. Hyperlipidemia: remains on statin   6. PVCs on prior EKG - totally asymptomatic - back on his beta blocker.   7. Psyche disorder.   Current medicines are reviewed with the patient today.  The patient does not have concerns regarding medicines other than what has been noted above.  The following changes have been made:  See above.  Labs/ tests ordered today include:    Orders Placed This Encounter  Procedures  . DG Chest 2 View  . Basic metabolic panel  . CBC  . APTT  . Protime-INR     Disposition:   Tentative see back in 6 months unless otherwise needed post cath.    Patient is agreeable to this plan and will call if any problems develop in the interim.   SignedTruitt Merle, NP  02/09/2017 9:59 AM  [facility] [address] [place], [place]  [address] Phone: [phone] Fax: [phone]

## 2017-02-09 NOTE — Patient Instructions (Addendum)
We will be checking the following labs today - BMET, CBC, PT, PTT  Please go to [facility] to [place] on the first floor for a chest Xray - you may walk in.     Medication Instructions:    Continue with your current medicines.   I refilled your NTG today    Testing/Procedures To Be Arranged:  Cardiac catheterization with Dr. [name] - this Thursday   Your provider has recommended a cardiac catherization  You are scheduled for a cardiac catheterization on Thursday, March 1st at 7:30 AM with Dr. [name] or associate.  Please arrive at the [facility] (Main Entrance) at [facility] at [address] [place] Stay on Thursday, March 1st at 5:30AM.    Special note: Every effort is made to have your procedure done on time.   Please understand that emergencies sometimes delay a scheduled   procedure.  No food or drink after midnight on Wednesday.  You may take your morning medications with a sip of water on the day of your procedure.  Medications to [place] for a one night stay -- bring personal belongings.  Bring a current list of your medications and current insurance cards.  You MUST have a responsible person to drive you home. Someone MUST be with you the first 24 hours after you arrive home or your discharge will be delayed. Wear clothes that are easy to get on and off and wear slip on shoes.    Coronary Angiogram A coronary angiogram, also called coronary angiography, is an X-ray procedure used to look at the arteries in the heart. In this procedure, a dye (contrast dye) is injected through a long, hollow tube (catheter). The catheter is about the size of a piece of cooked spaghetti and is inserted through your groin, wrist, or arm. The dye is injected into each artery, and X-rays are then taken to show if there is a blockage in the arteries of your heart.  LET [facility] CARE PROVIDER KNOW ABOUT:  Any allergies you  have, including allergies to shellfish or contrast dye.   All medicines you are taking, including vitamins, herbs, eye drops, creams, and over-the-counter medicines.   Previous problems you or members of your family have had with the use of anesthetics.   Any blood disorders you have.   Previous surgeries you have had.  History of kidney problems or failure.   Other medical conditions you have.  RISKS AND COMPLICATIONS  Generally, a coronary angiogram is a safe procedure. However, about 1 person out of 1000 can have problems that may include:  Allergic reaction to the dye.  Bleeding/bruising from the access site or other locations.  Kidney injury, especially in people with impaired kidney function.  Stroke (rare).  Heart attack (rare).  Irregular rhythms (rare)  Death (rare)  BEFORE THE PROCEDURE   Do not eat or drink anything after midnight the night before the procedure or as directed by your health care provider.   Ask your health care provider about changing or stopping your regular medicines. This is especially important if you are taking diabetes medicines or blood thinners.  PROCEDURE  You may be given a medicine to help you relax (sedative) before the procedure. This medicine is given through an intravenous (IV) access tube that is inserted into one of your veins.   The area where the catheter will be inserted will be washed and shaved. This is  usually done in the groin but may be done in the fold of your arm (near your elbow) or in the wrist.   A medicine will be given to numb the area where the catheter will be inserted (local anesthetic).   The health care provider will insert the catheter into an artery. The catheter will be guided by using a special type of X-ray (fluoroscopy) of the blood vessel being examined.   A special dye will then be injected into the catheter, and X-rays will be taken. The dye will help to show where any narrowing or  blockages are located in the heart arteries.    AFTER THE PROCEDURE   If the procedure is done through the leg, you will be kept in bed lying flat for several hours. You will be instructed to not bend or cross your legs.  The insertion site will be checked frequently.   The pulse in your feet or wrist will be checked frequently.   Additional blood tests, X-rays, and an electrocardiogram may be done.       Other Special Instructions:   N/A    If you need a refill on your cardiac medications before your next appointment, please call your pharmacy.   Call the [place] office at [phone] if you have any questions, problems or concerns.

## 2017-02-12 ENCOUNTER — Encounter (HOSPITAL_COMMUNITY)
Admission: RE | Disposition: A | Discharge status: Home / Self Care | Payer: Self-pay | Source: Ambulatory Visit | Attending: Cardiology

## 2017-02-12 ENCOUNTER — Encounter (HOSPITAL_COMMUNITY): Payer: Self-pay | Admitting: Cardiology

## 2017-02-12 ENCOUNTER — Ambulatory Visit (HOSPITAL_COMMUNITY)
Admission: RE | Admit: 2017-02-12 | Discharge: 2017-02-12 | Disposition: A | Discharge status: Home / Self Care | Payer: Medicare HMO | Source: Ambulatory Visit | Attending: Cardiology | Admitting: Cardiology

## 2017-02-12 DIAGNOSIS — Z955 Presence of coronary angioplasty implant and graft: Secondary | ICD-10-CM

## 2017-02-12 DIAGNOSIS — G4733 Obstructive sleep apnea (adult) (pediatric): Secondary | ICD-10-CM | POA: Insufficient documentation

## 2017-02-12 DIAGNOSIS — F319 Bipolar disorder, unspecified: Secondary | ICD-10-CM | POA: Diagnosis not present

## 2017-02-12 DIAGNOSIS — I2510 Atherosclerotic heart disease of native coronary artery without angina pectoris: Secondary | ICD-10-CM | POA: Diagnosis present

## 2017-02-12 DIAGNOSIS — Z7982 Long term (current) use of aspirin: Secondary | ICD-10-CM | POA: Diagnosis not present

## 2017-02-12 DIAGNOSIS — N400 Enlarged prostate without lower urinary tract symptoms: Secondary | ICD-10-CM | POA: Insufficient documentation

## 2017-02-12 DIAGNOSIS — I493 Ventricular premature depolarization: Secondary | ICD-10-CM | POA: Diagnosis not present

## 2017-02-12 DIAGNOSIS — I252 Old myocardial infarction: Secondary | ICD-10-CM | POA: Diagnosis not present

## 2017-02-12 DIAGNOSIS — I209 Angina pectoris, unspecified: Secondary | ICD-10-CM | POA: Diagnosis present

## 2017-02-12 DIAGNOSIS — Z87891 Personal history of nicotine dependence: Secondary | ICD-10-CM | POA: Diagnosis not present

## 2017-02-12 DIAGNOSIS — E785 Hyperlipidemia, unspecified: Secondary | ICD-10-CM | POA: Diagnosis not present

## 2017-02-12 DIAGNOSIS — Y712 Prosthetic and other implants, materials and accessory cardiovascular devices associated with adverse incidents: Secondary | ICD-10-CM | POA: Diagnosis not present

## 2017-02-12 DIAGNOSIS — E7800 Pure hypercholesterolemia, unspecified: Secondary | ICD-10-CM | POA: Diagnosis present

## 2017-02-12 DIAGNOSIS — I25118 Atherosclerotic heart disease of native coronary artery with other forms of angina pectoris: Secondary | ICD-10-CM | POA: Diagnosis not present

## 2017-02-12 DIAGNOSIS — F419 Anxiety disorder, unspecified: Secondary | ICD-10-CM | POA: Diagnosis not present

## 2017-02-12 DIAGNOSIS — I509 Heart failure, unspecified: Secondary | ICD-10-CM | POA: Insufficient documentation

## 2017-02-12 DIAGNOSIS — I080 Rheumatic disorders of both mitral and aortic valves: Secondary | ICD-10-CM | POA: Insufficient documentation

## 2017-02-12 DIAGNOSIS — I25119 Atherosclerotic heart disease of native coronary artery with unspecified angina pectoris: Secondary | ICD-10-CM | POA: Diagnosis not present

## 2017-02-12 DIAGNOSIS — T82855A Stenosis of coronary artery stent, initial encounter: Secondary | ICD-10-CM | POA: Diagnosis not present

## 2017-02-12 HISTORY — PX: LEFT HEART CATH AND CORONARY ANGIOGRAPHY: CATH118249

## 2017-02-12 HISTORY — DX: Atherosclerotic heart disease of native coronary artery without angina pectoris: I25.10

## 2017-02-12 HISTORY — PX: CORONARY STENT INTERVENTION: CATH118234

## 2017-02-12 LAB — POCT ACTIVATED CLOTTING TIME: Activated Clotting Time: 290 seconds

## 2017-02-12 SURGERY — LEFT HEART CATH AND CORONARY ANGIOGRAPHY

## 2017-02-12 MED ORDER — ACETAMINOPHEN 325 MG PO TABS: 650.0000 mg | ORAL_TABLET | ORAL | Status: DC | PRN

## 2017-02-12 MED ORDER — SODIUM CHLORIDE 0.9 % IV SOLN
INTRAVENOUS | Status: DC
  Administered 2017-02-12: 07:00:00 via INTRAVENOUS

## 2017-02-12 MED ORDER — IOPAMIDOL (ISOVUE-370) INJECTION 76%
INTRAVENOUS | Status: DC | PRN
  Administered 2017-02-12: 125 mL via INTRA_ARTERIAL

## 2017-02-12 MED ORDER — HYDRALAZINE HCL 20 MG/ML IJ SOLN: 5.0000 mg | INTRAMUSCULAR | Status: AC | PRN

## 2017-02-12 MED ORDER — HEPARIN SODIUM (PORCINE) 1000 UNIT/ML IJ SOLN
INTRAMUSCULAR | Status: DC | PRN
  Administered 2017-02-12: 4000 [IU] via INTRAVENOUS
  Administered 2017-02-12: 5000 [IU] via INTRAVENOUS

## 2017-02-12 MED ORDER — CLOPIDOGREL BISULFATE 75 MG PO TABS: 75.0000 mg | ORAL_TABLET | Freq: Every day | ORAL | Status: DC

## 2017-02-12 MED ORDER — ONDANSETRON HCL 4 MG/2ML IJ SOLN: 4.0000 mg | Freq: Four times a day (QID) | INTRAMUSCULAR | Status: DC | PRN

## 2017-02-12 MED ORDER — SODIUM CHLORIDE 0.9% FLUSH: 3.0000 mL | Freq: Two times a day (BID) | INTRAVENOUS | Status: DC

## 2017-02-12 MED ORDER — CLOPIDOGREL BISULFATE 75 MG PO TABS: 75.0000 mg | ORAL_TABLET | Freq: Every day | ORAL | 3 refills | Status: DC

## 2017-02-12 MED ORDER — IOPAMIDOL (ISOVUE-370) INJECTION 76%
INTRAVENOUS | Status: AC
Start: 2017-02-12 — End: ?
  Filled 2017-02-12: qty 100

## 2017-02-12 MED ORDER — LIDOCAINE HCL (PF) 1 % IJ SOLN
INTRAMUSCULAR | Status: AC
  Filled 2017-02-12: qty 30

## 2017-02-12 MED ORDER — LIDOCAINE HCL (PF) 1 % IJ SOLN
INTRAMUSCULAR | Status: DC | PRN
  Administered 2017-02-12: 3 mL via INTRADERMAL

## 2017-02-12 MED ORDER — SODIUM CHLORIDE 0.9 % IV SOLN: 250.0000 mL | INTRAVENOUS | Status: DC | PRN

## 2017-02-12 MED ORDER — LABETALOL HCL 5 MG/ML IV SOLN: 10.0000 mg | INTRAVENOUS | Status: AC | PRN

## 2017-02-12 MED ORDER — CLOPIDOGREL BISULFATE 75 MG PO TABS: 75.0000 mg | ORAL_TABLET | Freq: Every day | ORAL | 0 refills | Status: DC

## 2017-02-12 MED ORDER — CLOPIDOGREL BISULFATE 300 MG PO TABS
ORAL_TABLET | ORAL | Status: AC
  Filled 2017-02-12: qty 2

## 2017-02-12 MED ORDER — HEPARIN SODIUM (PORCINE) 1000 UNIT/ML IJ SOLN
INTRAMUSCULAR | Status: AC
  Filled 2017-02-12: qty 1

## 2017-02-12 MED ORDER — NITROGLYCERIN 1 MG/10 ML FOR IR/CATH LAB
INTRA_ARTERIAL | Status: AC
  Filled 2017-02-12: qty 10

## 2017-02-12 MED ORDER — ATORVASTATIN CALCIUM 80 MG PO TABS: ORAL_TABLET | ORAL | 3 refills | Status: DC

## 2017-02-12 MED ORDER — VERAPAMIL HCL 2.5 MG/ML IV SOLN
INTRAVENOUS | Status: DC | PRN
  Administered 2017-02-12: 10 mL via INTRA_ARTERIAL

## 2017-02-12 MED ORDER — HEPARIN (PORCINE) IN NACL 2-0.9 UNIT/ML-% IJ SOLN
INTRAMUSCULAR | Status: DC | PRN
  Administered 2017-02-12: 1500 mL via INTRA_ARTERIAL

## 2017-02-12 MED ORDER — DIAZEPAM 5 MG PO TABS
ORAL_TABLET | ORAL | Status: DC
Start: 2017-02-12 — End: 2017-02-12
  Filled 2017-02-12: qty 1

## 2017-02-12 MED ORDER — VERAPAMIL HCL 2.5 MG/ML IV SOLN
INTRAVENOUS | Status: AC
  Filled 2017-02-12: qty 2

## 2017-02-12 MED ORDER — SODIUM CHLORIDE 0.9 % WEIGHT BASED INFUSION: 1.0000 mL/kg/h | INTRAVENOUS | Status: AC

## 2017-02-12 MED ORDER — ASPIRIN 81 MG PO CHEW: 81.0000 mg | CHEWABLE_TABLET | ORAL | Status: DC

## 2017-02-12 MED ORDER — ANGIOPLASTY BOOK
Freq: Once | Status: DC
  Filled 2017-02-12: qty 1

## 2017-02-12 MED ORDER — DIAZEPAM 5 MG PO TABS
ORAL_TABLET | ORAL | Status: AC
  Administered 2017-02-12: 10 mg via ORAL
  Filled 2017-02-12: qty 1

## 2017-02-12 MED ORDER — DIAZEPAM 5 MG PO TABS
10.0000 mg | ORAL_TABLET | ORAL | Status: AC
  Administered 2017-02-12: 10 mg via ORAL

## 2017-02-12 MED ORDER — NITROGLYCERIN 1 MG/10 ML FOR IR/CATH LAB
INTRA_ARTERIAL | Status: DC | PRN
  Administered 2017-02-12: 200 ug via INTRACORONARY

## 2017-02-12 MED ORDER — CLOPIDOGREL BISULFATE 300 MG PO TABS
ORAL_TABLET | ORAL | Status: DC | PRN
  Administered 2017-02-12: 600 mg via ORAL

## 2017-02-12 MED ORDER — SODIUM CHLORIDE 0.9% FLUSH
3.0000 mL | INTRAVENOUS | Status: DC | PRN
Start: 2017-02-12 — End: 2017-02-12

## 2017-02-12 MED ORDER — HEPARIN (PORCINE) IN NACL 2-0.9 UNIT/ML-% IJ SOLN
INTRAMUSCULAR | Status: AC
  Filled 2017-02-12: qty 1500

## 2017-02-12 MED ORDER — IOPAMIDOL (ISOVUE-370) INJECTION 76%
INTRAVENOUS | Status: AC
  Filled 2017-02-12: qty 100

## 2017-02-12 MED FILL — CLOPIDOGREL 75 MG TABLET: 75 | 30 days supply | Qty: 30 | Fill #0

## 2017-02-12 SURGICAL SUPPLY — 17 items
BALLN EMERGE MR 3.0X12 (BALLOONS) ×3
BALLN ~~LOC~~ EMERGE MR 4.0X8 (BALLOONS) ×3
BALLOON EMERGE MR 3.0X12 (BALLOONS) ×1 IMPLANT
BALLOON ~~LOC~~ EMERGE MR 4.0X8 (BALLOONS) ×1 IMPLANT
CATH 5FR JL3.5 JR4 ANG PIG MP (CATHETERS) ×2 IMPLANT
DEVICE RAD COMP TR BAND LRG (VASCULAR PRODUCTS) ×3 IMPLANT
GLIDESHEATH SLEND SS 6F .021 (SHEATH) ×3 IMPLANT
GUIDEWIRE INQWIRE 1.5J.035X260 (WIRE) ×1 IMPLANT
INQWIRE 1.5J .035X260CM (WIRE) ×3
KIT ENCORE 26 ADVANTAGE (KITS) ×3 IMPLANT
KIT HEART LEFT (KITS) ×3 IMPLANT
PACK CARDIAC CATHETERIZATION (CUSTOM PROCEDURE TRAY) ×3 IMPLANT
STENT RESOLUTE ONYX 4.0X12 (Permanent Stent) ×3 IMPLANT
SYR MEDRAD MARK V 150ML (SYRINGE) ×3 IMPLANT
TRANSDUCER W/STOPCOCK (MISCELLANEOUS) ×3 IMPLANT
TUBING CIL FLEX 10 FLL-RA (TUBING) ×3 IMPLANT
WIRE ASAHI PROWATER 180CM (WIRE) ×3 IMPLANT

## 2017-02-12 NOTE — H&P (View-Only) (Signed)
CARDIOLOGY OFFICE NOTE  Date:  02/09/2017    Derrick [name] Date of Birth: Apr 16, 1956 Medical Record [id number]  PCP:  Wardell Honour, MD  Cardiologist:  Annabell Howells    Chief Complaint  Patient presents with  . Coronary Artery Disease    Follow up visit - seen for Dr. [name]    History of Present Illness: Derrick [name] is a 61 y.o. male who presents today for a follow up visit. Seen for Dr. [name].   He has a history of CAD s/p inferior STEMI with DES x 2 to RCA in 2004 and moderate MR with mitral valve prolapse.  His other issues include tobacco abuse, bipolar disorder, OSA and HLD.  Last echo from 10/2014 with EF of 45 to 50%, moderate LVH, MVP with mild to moderate MR.  Seen for first time by Dr. [name] back in 2014. Cardiac status appeared stable but was short of breath - ordered a Myoview, PFTs and BNP. BNP was 18. Low risk Myoview noted. PFTs without marked abnormality.  I have seen him back over the past few years - last seen back in January - he was doing well - had had some eye surgery and was told he had a "skip" in his heart. He was asymptomatic. Had been out of his beta blocker for at least 6 months - I restarted it. Got his echo updated - EF down more - reviewed with Dr. [name] - set up for Myoview - larger scarnoted. Cardiac cath is now recommended after discussion with Dr. [name].   Comes back today. Here with his wife. He continues to do well. He has good understanding of why he is here today. No chest pain. Not short of breath. Back on his beta blocker. He is asking whether being off of that has caused these results. Wife notes more fatigue. He does tire easier. No syncope. No palpitations. No recent CXR. Needs labs today.   PMH: 1. Bipolar disorder 2. OSA 3. CAD: STEMI 2004 with 2 overlapping Cypher stents to RCA.  4. Hyperlipidemia 5. Mitral regurgitation: Echo (10/14) with EF 55-60%, mild LVH, MV prolapse with moderate MR.   Comes in  today. Here with   Past Medical History:  Diagnosis Date  . Anxiety   . Bipolar 1 disorder ([place])   . BPH (benign prostatic hypertrophy)   . Coronary artery disease    DES x 2 to RCA in 2004  . Depression   . Mitral regurgitation and aortic stenosis   . MVP (mitral valve prolapse)   . OSA (obstructive sleep apnea) 03/22/2013    No past surgical history on file.   Medications: Current Outpatient Prescriptions  Medication Sig Dispense Refill  . acetaminophen (TYLENOL) 500 MG tablet Take 500 mg by mouth every 6 (six) hours as needed for pain.    [name] aspirin 81 MG tablet Take 81 mg by mouth daily.    [name] atorvastatin (LIPITOR) 80 MG tablet TAKE 1 TABLET  DAILY (NEED APPOINTMENT FOR FUTURE REFILLS, CALL OFFICE AT [phone] TO SCHEDULE APPOINTMENT) 90 tablet 0  . buPROPion (WELLBUTRIN XL) 150 MG 24 hr tablet Take 150 mg by mouth every morning.     . divalproex (DEPAKOTE ER) 500 MG 24 hr tablet Take 500 mg by mouth daily.     [name] escitalopram (LEXAPRO) 10 MG tablet Take 10 mg by mouth daily.     . hydrOXYzine (ATARAX/VISTARIL) 10 MG tablet Take 1 tablet (10 mg total)  by mouth 3 (three) times daily as needed. 30 tablet 0  . ibuprofen (ADVIL,MOTRIN) 600 MG tablet Take 600 mg by mouth every 6 (six) hours as needed.     . lamoTRIgine (LAMICTAL) 200 MG tablet Take 200 mg by mouth daily.     . metoprolol tartrate (LOPRESSOR) 25 MG tablet TAKE 1 TABLET TWICE DAILY 60 tablet 11  . montelukast (SINGULAIR) 10 MG tablet TAKE 1 TABLET EVERY DAY 90 tablet 0  . nitroGLYCERIN (NITROSTAT) 0.4 MG SL tablet Place 1 tablet (0.4 mg total) under the tongue every 5 (five) minutes as needed for chest pain. 25 tablet 3  . tamsulosin (FLOMAX) 0.4 MG CAPS capsule TAKE 1 CAPSULE EVERY DAY 90 capsule 0   No current facility-administered medications for this visit.     Allergies: No Known Allergies  Social History: The patient  reports that he quit smoking about 14 years ago. His smoking use included Cigarettes. He  has never used smokeless tobacco. He reports that he does not drink alcohol or use drugs.   Family History: The patient's family history is not on file.   Review of Systems: Please see the history of present illness.   Otherwise, the review of systems is positive for none.   All other systems are reviewed and negative.   Physical Exam: VS:  BP 100/70   Pulse 67   Ht 5\' 9"  (1.753 m)   Wt 183 lb 6.4 oz (83.2 kg)   SpO2 96% Comment: at rest  BMI 27.08 kg/m  .  BMI Body mass index is 27.08 kg/m.  Wt Readings from Last 3 Encounters:  02/09/17 183 lb 6.4 oz (83.2 kg)  01/19/17 186 lb (84.4 kg)  12/16/16 186 lb (84.4 kg)    General: Pleasant. Well developed, well nourished and in no acute distress.   HEENT: Normal.  Neck: Supple, no JVD, carotid bruits, or masses noted.  Cardiac: Regular rate and rhythm. No murmurs, rubs, or gallops. No edema.  Respiratory:  Lungs are clear to auscultation bilaterally with normal work of breathing.  GI: Soft and nontender.  MS: No deformity or atrophy. Gait and ROM intact.  Skin: Warm and dry. Color is normal.  Neuro:  Strength and sensation are intact and no gross focal deficits noted.  Psych: Alert, appropriate and with normal affect.   LABORATORY DATA:  EKG:  EKG is not ordered today.  Lab Results  Component Value Date   WBC 5.2 01/18/2016   HGB 13.3 11/29/2014   HCT 42.7 01/18/2016   PLT 174 01/18/2016   GLUCOSE 98 01/12/2017   CHOL 144 01/12/2017   TRIG 361 (H) 01/12/2017   HDL 36 (L) 01/12/2017   LDLCALC 36 01/12/2017   ALT 18 01/12/2017   AST 15 01/12/2017   NA 142 01/12/2017   K 4.2 01/12/2017   CL 101 01/12/2017   CREATININE 0.89 01/12/2017   BUN 9 01/12/2017   CO2 25 01/12/2017   TSH 3.360 07/19/2014    BNP (last 3 results) No results for input(s): BNP in the last 8760 hours.  ProBNP (last 3 results) No results for input(s): PROBNP in the last 8760 hours.   Other Studies Reviewed Today:  Myoview Study  Highlights 01/2017    Nuclear stress EF: 37%.  There was no ST segment deviation noted during stress.  Defect 1: There is a large defect of moderate severity present in the basal inferior, basal inferolateral, basal anterolateral, mid inferior, mid inferolateral and apical inferior location.  Findings consistent with prior myocardial infarction with peri-infarct ischemia.  This is an intermediate risk study.  The left ventricular ejection fraction is moderately decreased (30-44%).   There is a prior infarct in the entire inferior and basal inferolateral walls and peri-infarct ischemia in the basal and mid inferolateral and basal anterolateral walls (SDS =4). LVEF is moderately decreased calculated at 37% with akinesis in the entire inferior and basal inferolateral walls.   Notes Recorded by [name], MD on 01/19/2017 at 3:38 PM EST EF down a lot, probably primarily infarct but think he should have cath. I would be glad to do it whenever, just touch base with Kevan Rosebush regarding timing if he is in agreement.  Echo Study Conclusions 12/2016  - Left ventricle: The cavity size was normal. Wall thickness was   increased in a pattern of mild LVH. Systolic function was   moderately reduced. The estimated ejection fraction was in the   range of 35% to 40%. Diffuse hypokinesis. Doppler parameters are   consistent with abnormal left ventricular relaxation (grade 1   diastolic dysfunction). - Mitral valve: Calcified annulus. Mildly thickened leaflets .   Moderate prolapse. There was mild to moderate regurgitation.  Impressions:  - Moderate global reduction in LV systolic function; grade 1   diastolic dysfunction; mild LVH; prolapse of posterior MV   leaflet; mild to moderate MR. Notes Recorded by [name], MD on 01/12/2017 at 10:59 PM EST Would do Cardiolite given fall in EF (no cath if asymptomatic). Would make sure he is on Coreg and ACEI  Assessment / Plan:  1.  Worsening LV function -  Asymptomatic - Proceeding on with cardiac cath to further discern. Scheduled for this Thursday with Dr. [name]. The patient understands that risks include but are not limited to stroke (1 in 1000), death (1 in 60), kidney failure [usually temporary] (1 in 500), bleeding (1 in 200), allergic reaction [possibly serious] (1 in 200), and agrees to proceed.   2. CAD: no symptoms reported. Recent intermediate study - have reviewed with Dr. [name] and cardiac cath has been recommended - arranged for later this week. CHF has said to schedule for Thursday, March 1st.   3. Smoking: says he is not smoking  4. Mitral regurgitation: Moderate, from mitral valve prolapse. Recent echo noted.   5. Hyperlipidemia: remains on statin   6. PVCs on prior EKG - totally asymptomatic - back on his beta blocker.   7. Psyche disorder.   Current medicines are reviewed with the patient today.  The patient does not have concerns regarding medicines other than what has been noted above.  The following changes have been made:  See above.  Labs/ tests ordered today include:    Orders Placed This Encounter  Procedures  . DG Chest 2 View  . Basic metabolic panel  . CBC  . APTT  . Protime-INR     Disposition:   Tentative see back in 6 months unless otherwise needed post cath.    Patient is agreeable to this plan and will call if any problems develop in the interim.   SignedTruitt Merle, NP  02/09/2017 9:59 AM  [facility] [address] [place], [place]  [address] Phone: [phone] Fax: [phone]

## 2017-02-12 NOTE — Progress Notes (Signed)
[id number] Education completed with pt and wife who voiced understanding. Stressed importance of plavix with stent. Reviewed risk factors including smoking cessation and heart healthy eating. Gave pt smoking cessation handout and discussed reasoning for not smoking. Gave fake cigarette. Pt does not see much harm in one to two cigarettes a day. With low EF I encouraged watching sodium to 2000 mg and weighing daily. Discussed CRP 2 and will refer to [place]. Pt interested in losing weight. Encouraged less carb intake of sweets and increasing walking distance. Use up more calories than taken in. Wife very encouraging. Graylon Good RN BSN 02/12/2017 2:22 PM

## 2017-02-12 NOTE — Interval H&P Note (Signed)
Cath Lab Visit (complete for each Cath Lab visit)  Clinical Evaluation Leading to the Procedure:   ACS: No.  Non-ACS:    Anginal Classification: CCS III  Anti-ischemic medical therapy: Minimal Therapy (1 class of medications)  Non-Invasive Test Results: Intermediate-risk stress test findings: cardiac mortality 1-3%/year  Prior CABG: No previous CABG      History and Physical Interval Note:  02/12/2017 7:48 AM  Derrick [name]  has presented today for surgery, with the diagnosis of cad - hf  The various methods of treatment have been discussed with the patient and family. After consideration of risks, benefits and other options for treatment, the patient has consented to  Procedure(s): Left Heart Cath and Coronary Angiography (N/A) as a surgical intervention .  The patient's history has been reviewed, patient examined, no change in status, stable for surgery.  I have reviewed the patient's chart and labs.  Questions were answered to the patient's satisfaction.     Seri Kimmer [employer]

## 2017-02-12 NOTE — Discharge Instructions (Signed)
Radial Site Care °Refer to this sheet in the next few weeks. These instructions provide you with information about caring for yourself after your procedure. Your health care provider may also give you more specific instructions. Your treatment has been planned according to current medical practices, but problems sometimes occur. Call your health care provider if you have any problems or questions after your procedure. °What can I expect after the procedure? °After your procedure, it is typical to have the following: °· Bruising at the radial site that usually fades within 1-2 weeks. °· Blood collecting in the tissue (hematoma) that may be painful to the touch. It should usually decrease in size and tenderness within 1-2 weeks. °Follow these instructions at home: °· Take medicines only as directed by your health care provider. °· You may shower 24-48 hours after the procedure or as directed by your health care provider. Remove the bandage (dressing) and gently wash the site with plain soap and water. Pat the area dry with a clean towel. Do not rub the site, because this may cause bleeding. °· Do not take baths, swim, or use a hot tub until your health care provider approves. °· Check your insertion site every day for redness, swelling, or drainage. °· Do not apply powder or lotion to the site. °· Do not flex or bend the affected arm for 24 hours or as directed by your health care provider. °· Do not push or pull heavy objects with the affected arm for 24 hours or as directed by your health care provider. °· Do not lift over 10 lb (4.5 kg) for 5 days after your procedure or as directed by your health care provider. °· Ask your health care provider when it is okay to: °¨ Return to work or school. °¨ Resume usual physical activities or sports. °¨ Resume sexual activity. °· Do not drive home if you are discharged the same day as the procedure. Have someone else drive you. °· You may drive 24 hours after the procedure  unless otherwise instructed by your health care provider. °· Do not operate machinery or power tools for 24 hours after the procedure. °· If your procedure was done as an outpatient procedure, which means that you went home the same day as your procedure, a responsible adult should be with you for the first 24 hours after you arrive home. °· Keep all follow-up visits as directed by your health care provider. This is important. °Contact a health care provider if: °· You have a fever. °· You have chills. °· You have increased bleeding from the radial site. Hold pressure on the site. °Get help right away if: °· You have unusual pain at the radial site. °· You have redness, warmth, or swelling at the radial site. °· You have drainage (other than a small amount of blood on the dressing) from the radial site. °· The radial site is bleeding, and the bleeding does not stop after 30 minutes of holding steady pressure on the site. °· Your arm or hand becomes pale, cool, tingly, or numb. °This information is not intended to replace advice given to you by your health care provider. Make sure you discuss any questions you have with your health care provider. °Document Released: 01/03/2011 Document Revised: 05/08/2016 Document Reviewed: 06/19/2014 °Elsevier Interactive Patient Education © 2017 [employer] ° ° ° ° ° °Coronary Angiogram With Stent, Care After °This sheet gives you information about how to care for yourself after your procedure. Your health   care provider may also give you more specific instructions. If you have problems or questions, contact your health care provider. °What can I expect after the procedure? °After your procedure, it is common to have: °· Bruising in the area where a small, thin tube (catheter) was inserted. This usually fades within 1-2 weeks. °· Blood collecting in the tissue (hematoma) that may be painful to the touch. It should usually decrease in size and tenderness within 1-2 weeks. °Follow  these instructions at home: °Insertion area care  °· Do not take baths, swim, or use a hot tub until your health care provider approves. °· You may shower 24-48 hours after the procedure or as directed by your health care provider. °· Follow instructions from your health care provider about how to take care of your incision. Make sure you: °¨ Wash your hands with soap and water before you change your bandage (dressing). If soap and water are not available, use hand sanitizer. °¨ Change your dressing as told by your health care provider. °¨ Leave stitches (sutures), skin glue, or adhesive strips in place. These skin closures may need to stay in place for 2 weeks or longer. If adhesive strip edges start to loosen and curl up, you may trim the loose edges. Do not remove adhesive strips completely unless your health care provider tells you to do that. °· Remove the bandage (dressing) and gently wash the catheter insertion site with plain soap and water. °· Pat the area dry with a clean towel. Do not rub the area, because that may cause bleeding. °· Do not apply powder or lotion to the incision area. °· Check your incision area every day for signs of infection. Check for: °¨ More redness, swelling, or pain. °¨ More fluid or blood. °¨ Warmth. °¨ Pus or a bad smell. °Activity  °· Do not drive for 24 hours if you were given a medicine to help you relax (sedative). °· Do not lift anything that is heavier than 10 lb (4.5 kg) for 5 days after your procedure or as directed by your health care provider. °· Ask your health care provider when it is okay for you: °¨ To return to work or school. °¨ To resume usual physical activities or sports. °¨ To resume sexual activity. °Eating and drinking  °· Eat a heart-healthy diet. This should include plenty of fresh fruits and vegetables. °· Avoid the following types of food: °¨ Food that is high in salt. °¨ Canned or highly processed food. °¨ Food that is high in saturated fat or  sugar. °¨ Fried food. °· Limit alcohol intake to no more than 1 drink a day for non-pregnant women and 2 drinks a day for men. One drink equals 12 oz of beer, 5 oz of wine, or 1½ oz of hard liquor. °Lifestyle  °· Do not use any products that contain nicotine or tobacco, such as cigarettes and e-cigarettes. If you need help quitting, ask your health care provider. °· Take steps to manage and control your weight. °· Get regular exercise. °· Manage your blood pressure. °· Manage other health problems, such as diabetes. °General instructions  °· Take over-the-counter and prescription medicines only as told by your health care provider. Blood thinners may be prescribed after your procedure to improve blood flow through the stent. °· If you need an MRI after your heart stent has been placed, be sure to tell the health care provider who orders the MRI that you have a heart stent. °·   Keep all follow-up visits as directed by your health care provider. This is important. °Contact a health care provider if: °· You have a fever. °· You have chills. °· You have increased bleeding from the catheter insertion area. Hold pressure on the area. °Get help right away if: °· You develop chest pain or shortness of breath. °· You feel faint or you pass out. °· You have unusual pain at the catheter insertion area. °· You have redness, warmth, or swelling at the catheter insertion area. °· You have drainage (other than a small amount of blood on the dressing) from the catheter insertion area. °· The catheter insertion area is bleeding, and the bleeding does not stop after 30 minutes of holding steady pressure on the area. °· You develop bleeding from any other place, such as from your rectum. There may be bright red blood in your urine or stool, or it may appear as black, tarry stool. °This information is not intended to replace advice given to you by your health care provider. Make sure you discuss any questions you have with your health  care provider. °Document Released: 06/20/2005 Document Revised: 08/28/2016 Document Reviewed: 08/28/2016 °Elsevier Interactive Patient Education © 2017 Elsevier Inc. ° ° °

## 2017-02-12 NOTE — Discharge Summary (Addendum)
Discharge Summary    Patient ID: Derrick [name],  MRN: [id number], DOB/AGE: [birth year] 59 y.o.  Admit date: 02/12/2017 Discharge date: 02/12/2017  Primary Care Provider: Wardell Honour Primary Cardiologist: Lori/McLean  Discharge Diagnoses    Active Problems:   HYPERCHOLESTEROLEMIA   Coronary atherosclerosis   OSA (obstructive sleep apnea)  Allergies No Known Allergies  Diagnostic Studies/Procedures    LHC: 02/12/17    Mid RCA lesion, 90 %stenosed.  A STENT RESOLUTE ONYX 4.0X12 drug eluting stent was successfully placed.  Prox RCA to Mid RCA lesion, 90 %stenosed.  Post intervention, there is a 0% residual stenosis.   Successful stenting of the mid RCA with DES for in stent restenosis.  Plan: DAPT for one year with ASA and Plavix. May be suitable for same day discharge.     Diagnostic Diagram     Post-Intervention Diagram      _____________   History of Present Illness     61 yo male with PMH of CAD s/p inferior STEMI with DES x 2 to RCA in 2004 and moderate MR with mitral valve prolapse. His other issues include tobacco abuse, bipolar disorder, OSA and HLD.  Last echo from 11/2015with EF of 45 to 50%, moderateLVH, MVP with mild to moderate MR.  Seen for first time by Dr. [name] back in 2014. Cardiac status appeared stable but was short of breath - ordered a Myoview, PFTs and BNP. BNP was 18. Low risk Myoview noted. PFTs without marked abnormality.  He has been seen by [name] the past few years - last seen back in January - he was doing well - had had some eye surgery and was told he had a "skip" in his heart. He was asymptomatic. Had been out of his beta blocker for at least 6 months - it was restarted it. Got his echo updated - EF down more - reviewed with Dr. [name] - set up for Myoview - larger scar noted. Cardiac cath is now recommended after discussion with Dr. [name].   Was seen in the office on 02/09/17 to discuss the need for  cardiac catheterization.    Hospital Course     Consultants: none   He was admitted for outpatient right radial cardiac catheterization with Dr. [name]. Cath noted mRCA lesion at 90% treated with DES x1. Will plan for DAPT with ASA and Plavix for one year. He felt well post cath, no chest pain. Vital signs stable. Right radial site stable without hematoma, mild bruising. He was seen by Cardiac Rehab and educated on diet, exercise and restrictions prior to discharge. He was not placed on ACEi/ARB at the time of Dc due to soft blood pressures. Can consider this in follow up. Follow up has been arranged.  _____________  Discharge Vitals Blood pressure (!) 105/55, pulse (!) 58, temperature 98 F (36.7 C), temperature source Oral, resp. rate 14, height 5\' 9"  (1.753 m), weight 183 lb (83 kg), SpO2 96 %.  Filed Weights   02/12/17 0555  Weight: 183 lb (83 kg)    Labs & Radiologic Studies    CBC No results for input(s): WBC, NEUTROABS, HGB, HCT, MCV, PLT in the last 72 hours. Basic Metabolic Panel No results for input(s): NA, K, CL, CO2, GLUCOSE, BUN, CREATININE, CALCIUM, MG, PHOS in the last 72 hours. Liver Function Tests No results for input(s): AST, ALT, ALKPHOS, BILITOT, PROT, ALBUMIN in the last 72 hours. No results for input(s): LIPASE, AMYLASE in the last 72  hours. Cardiac Enzymes No results for input(s): CKTOTAL, CKMB, CKMBINDEX, TROPONINI in the last 72 hours. BNP Invalid input(s): POCBNP D-Dimer No results for input(s): DDIMER in the last 72 hours. Hemoglobin A1C No results for input(s): HGBA1C in the last 72 hours. Fasting Lipid Panel No results for input(s): CHOL, HDL, LDLCALC, TRIG, CHOLHDL, LDLDIRECT in the last 72 hours. Thyroid Function Tests No results for input(s): TSH, T4TOTAL, T3FREE, THYROIDAB in the last 72 hours.  Invalid input(s): FREET3 _____________  Dg Chest 2 View  Result Date: 02/09/2017 CLINICAL DATA:  Preoperative exam prior to cardiac catheterization.  History of CHF, mitral insufficiency, ischemic heart disease. EXAM: CHEST  2 VIEW COMPARISON:  PA and lateral chest x-ray of December 02, 2012 FINDINGS: The lungs are reasonably well inflated. There is no focal infiltrate. There is no pleural effusion. The heart and pulmonary vascularity are normal. There is a coronary artery stent versus coronary artery calcifications visible on the lateral view. The mediastinum is normal in width. The bony thorax exhibits no acute abnormality. IMPRESSION: There is no active cardiopulmonary disease. Coronary artery calcification or stent visible. Electronically Signed   By: David  Martinique M.D.   On: 02/09/2017 10:38   Disposition   Pt is being discharged home today in good condition.  Follow-up Plans & Appointments    Follow-up Information    Truitt Merle, NP Follow up on 02/18/2017.   Specialties:  Nurse Practitioner, Interventional Cardiology, Cardiology, Radiology Why:  at 2:30pm for your follow up appt. Please arrive by 2:15pm to check in. Contact information: [place]. [address] [place] [address] [phone]          Discharge Instructions    (HEART FAILURE PATIENTS) Call MD:  Anytime you have any of the following symptoms: 1) 3 pound weight gain in 24 hours or 5 pounds in 1 week 2) shortness of breath, with or without a dry hacking cough 3) swelling in the hands, feet or stomach 4) if you have to sleep on extra pillows at night in order to breathe.    Complete by:  As directed    Call MD for:  redness, tenderness, or signs of infection (pain, swelling, redness, odor or green/yellow discharge around incision site)    Complete by:  As directed    Diet - low sodium heart healthy    Complete by:  As directed    Discharge instructions    Complete by:  As directed    Radial Site Care Refer to this sheet in the next few weeks. These instructions provide you with information on caring for yourself after your procedure. Your caregiver may  also give you more specific instructions. Your treatment has been planned according to current medical practices, but problems sometimes occur. Call your caregiver if you have any problems or questions after your procedure. HOME CARE INSTRUCTIONS You may shower the day after the procedure.Remove the bandage (dressing) and gently wash the site with plain soap and water.Gently pat the site dry.  Do not apply powder or lotion to the site.  Do not submerge the affected site in water for 3 to 5 days.  Inspect the site at least twice daily.  Do not flex or bend the affected arm for 24 hours.  No lifting over 5 pounds (2.3 kg) for 5 days after your procedure.  Do not drive home if you are discharged the same day of the procedure. Have someone else drive you.  You may drive 24 hours after the  procedure unless otherwise instructed by your caregiver.  What to expect: Any bruising will usually fade within 1 to 2 weeks.  Blood that collects in the tissue (hematoma) may be painful to the touch. It should usually decrease in size and tenderness within 1 to 2 weeks.  SEEK IMMEDIATE MEDICAL CARE IF: You have unusual pain at the radial site.  You have redness, warmth, swelling, or pain at the radial site.  You have drainage (other than a small amount of blood on the dressing).  You have chills.  You have a fever or persistent symptoms for more than 72 hours.  You have a fever and your symptoms suddenly get worse.  Your arm becomes pale, cool, tingly, or numb.  You have heavy bleeding from the site. Hold pressure on the site.   Increase activity slowly    Complete by:  As directed       Discharge Medications   Current Discharge Medication List    START taking these medications   Details  !! clopidogrel (PLAVIX) 75 MG tablet Take 1 tablet (75 mg total) by mouth daily. Qty: 30 tablet, Refills: 0    !! clopidogrel (PLAVIX) 75 MG tablet Take 1 tablet (75 mg total) by mouth daily with breakfast. Qty:  90 tablet, Refills: 3     !! - Potential duplicate medications found. Please discuss with provider.    CONTINUE these medications which have CHANGED   Details  atorvastatin (LIPITOR) 80 MG tablet TAKE 1 TABLET  DAILY Qty: 90 tablet, Refills: 3      CONTINUE these medications which have NOT CHANGED   Details  acetaminophen (TYLENOL) 500 MG tablet Take 500 mg by mouth every 6 (six) hours as needed for mild pain.     aspirin 81 MG tablet Take 81 mg by mouth daily.    BESIVANCE 0.6 % SUSP Place 1 drop into both eyes 3 (three) times daily.    buPROPion (WELLBUTRIN XL) 150 MG 24 hr tablet Take 450 mg by mouth daily.     divalproex (DEPAKOTE) 500 MG DR tablet Take 2,500 mg by mouth at bedtime.    escitalopram (LEXAPRO) 10 MG tablet Take 10 mg by mouth daily.     lamoTRIgine (LAMICTAL) 200 MG tablet Take 200 mg by mouth daily.     metoprolol tartrate (LOPRESSOR) 25 MG tablet TAKE 1 TABLET TWICE DAILY Qty: 60 tablet, Refills: 11    montelukast (SINGULAIR) 10 MG tablet TAKE 1 TABLET EVERY DAY Qty: 90 tablet, Refills: 0    nitroGLYCERIN (NITROSTAT) 0.4 MG SL tablet Place 1 tablet (0.4 mg total) under the tongue every 5 (five) minutes as needed for chest pain. Qty: 25 tablet, Refills: 3    tamsulosin (FLOMAX) 0.4 MG CAPS capsule TAKE 1 CAPSULE EVERY DAY Qty: 90 capsule, Refills: 0    hydrOXYzine (ATARAX/VISTARIL) 10 MG tablet Take 1 tablet (10 mg total) by mouth 3 (three) times daily as needed. Qty: 30 tablet, Refills: 0          Outstanding Labs/Studies   None  Duration of Discharge Encounter   Greater than 30 minutes including physician time.  Signed, Reino Bellis NP-C 02/12/2017, 2:16 PM

## 2017-02-13 ENCOUNTER — Encounter (HOSPITAL_COMMUNITY): Payer: Self-pay | Admitting: Cardiology

## 2017-02-17 NOTE — Progress Notes (Signed)
CARDIOLOGY OFFICE NOTE  Date:  02/18/2017    Derrick [name] Date of Birth: May 17, 1956 Medical Record [id number]  PCP:  Wardell Honour, MD  Cardiologist:  Annabell Howells  Chief Complaint  Patient presents with  . Coronary Artery Disease    Post hospital visit - seen for Dr. [name]    History of Present Illness: Derrick [name] is a 61 y.o. male who presents today for a post hospital visit. Seen for Dr. [name].   He has a history of CAD s/p inferior STEMI with DES x 2 to RCA in 2004 and moderate MR with mitral valve prolapse. His other issues include tobacco abuse, bipolar disorder, OSA and HLD.  Last echo from 11/2015with EF of 45 to 50%, moderateLVH, MVP with mild to moderate MR.  Seen for first time by Dr. [name] back in 2014. Cardiac status appeared stable but was short of breath - ordered a Myoview, PFTs and BNP. BNP was 18. Low risk Myoview noted. PFTs without marked abnormality.  I have seen him back over the past few years - last seen back in January - he was doing well - had had some eye surgery and was told he had a "skip" in his heart. He was asymptomatic. Had been out of his beta blocker for at least 6 months - I restarted it. Got his echo updated - EF down more - reviewed with Dr. [name] - set up for Myoview - larger scar noted. Cardiac cath recommended - ended up having PCI.   Comes back today. Here with his wife. He feels ok. Has not really done anything since his PCI - has been taking it easy but feels fine. On DAPT with Plavix.No problems noted.  Planning on being more active now. No chest pain. Not short of breath. Feels ok on his regimen. Not dizzy or lightheaded.   Past Medical History:  Diagnosis Date  . Anxiety   . Bipolar 1 disorder ([place])   . BPH (benign prostatic hypertrophy)   . CAD (coronary artery disease), native coronary artery    a. DES x 2 to RCA in 2004 b. 02/11/17 PCI w/DES x1 to mRCA  . Depression   . Mitral regurgitation and  aortic stenosis   . MVP (mitral valve prolapse)   . OSA (obstructive sleep apnea) 03/22/2013    Past Surgical History:  Procedure Laterality Date  . CORONARY STENT INTERVENTION N/A 02/12/2017   Procedure: Coronary Stent Intervention;  Surgeon: [name], MD;  Location: [facility];  Service: Cardiovascular;  Laterality: N/A;  . LEFT HEART CATH AND CORONARY ANGIOGRAPHY N/A 02/12/2017   Procedure: Left Heart Cath and Coronary Angiography;  Surgeon: [name], MD;  Location: [facility];  Service: Cardiovascular;  Laterality: N/A;     Medications: Current Outpatient Prescriptions  Medication Sig Dispense Refill  . acetaminophen (TYLENOL) 500 MG tablet Take 500 mg by mouth every 6 (six) hours as needed for mild pain.     [name] aspirin 81 MG tablet Take 81 mg by mouth daily.    [name] atorvastatin (LIPITOR) 80 MG tablet TAKE 1 TABLET  DAILY 90 tablet 3  . BESIVANCE 0.6 % SUSP Place 1 drop into both eyes 3 (three) times daily.    [name] buPROPion (WELLBUTRIN XL) 150 MG 24 hr tablet Take 450 mg by mouth daily.     . clopidogrel (PLAVIX) 75 MG tablet Take 1 tablet (75 mg total) by mouth daily.  30 tablet 0  . divalproex (DEPAKOTE) 500 MG DR tablet Take 2,500 mg by mouth at bedtime.    [name] escitalopram (LEXAPRO) 10 MG tablet Take 10 mg by mouth daily.     . hydrOXYzine (ATARAX/VISTARIL) 10 MG tablet Take 1 tablet (10 mg total) by mouth 3 (three) times daily as needed. (Patient taking differently: Take 10 mg by mouth 3 (three) times daily as needed for anxiety. ) 30 tablet 0  . lamoTRIgine (LAMICTAL) 200 MG tablet Take 200 mg by mouth daily.     . metoprolol tartrate (LOPRESSOR) 25 MG tablet TAKE 1 TABLET TWICE DAILY 60 tablet 11  . montelukast (SINGULAIR) 10 MG tablet TAKE 1 TABLET EVERY DAY 90 tablet 0  . nitroGLYCERIN (NITROSTAT) 0.4 MG SL tablet Place 1 tablet (0.4 mg total) under the tongue every 5 (five) minutes as needed for chest pain. 25 tablet 3  . tamsulosin (FLOMAX) 0.4 MG CAPS  capsule TAKE 1 CAPSULE EVERY DAY 90 capsule 0   No current facility-administered medications for this visit.     Allergies: No Known Allergies  Social History: The patient  reports that he quit smoking about 14 years ago. His smoking use included Cigarettes. He has never used smokeless tobacco. He reports that he does not drink alcohol or use drugs.   Family History: The patient's family history includes COPD in his father; Heart disease in his brother and mother. Parents deceased - father died at 8 with emphysema. Mother died at 83 with heart disease. 2 sisters and 1 brother - one with PPM, one with ICD and one with CABG.   Review of Systems: Please see the history of present illness.   Otherwise, the review of systems is positive for none.   All other systems are reviewed and negative.   Physical Exam: VS:  BP 102/68   Pulse 68   Ht 5\' 9"  (1.753 m)   Wt 185 lb 12.8 oz (84.3 kg)   BMI 27.44 kg/m  .  BMI Body mass index is 27.44 kg/m.  Wt Readings from Last 3 Encounters:  02/18/17 185 lb 12.8 oz (84.3 kg)  02/12/17 183 lb (83 kg)  02/09/17 183 lb 6.4 oz (83.2 kg)    General: Pleasant. Well developed, well nourished and in no acute distress.   HEENT: Normal.  Neck: Supple, no JVD, carotid bruits, or masses noted.  Cardiac: Regular rate and rhythm. No murmurs, rubs, or gallops. No edema.  Respiratory:  Lungs are clear to auscultation bilaterally with normal work of breathing.  GI: Soft and nontender.  MS: No deformity or atrophy. Gait and ROM intact.  Skin: Warm and dry. Color is normal.  Neuro:  Strength and sensation are intact and no gross focal deficits noted.  Psych: Alert, appropriate and with normal affect. Right wrist looks fine.    LABORATORY DATA:  EKG:  EKG is not ordered today.  Lab Results  Component Value Date   WBC 7.3 02/09/2017   HGB 13.3 11/29/2014   HCT 40.5 02/09/2017   PLT 180 02/09/2017   GLUCOSE 87 02/09/2017   CHOL 144 01/12/2017   TRIG  361 (H) 01/12/2017   HDL 36 (L) 01/12/2017   LDLCALC 36 01/12/2017   ALT 18 01/12/2017   AST 15 01/12/2017   NA 142 02/09/2017   K 4.4 02/09/2017   CL 104 02/09/2017   CREATININE 0.99 02/09/2017   BUN 9 02/09/2017   CO2 23 02/09/2017   TSH 3.360 07/19/2014   INR  1.0 02/09/2017    BNP (last 3 results) No results for input(s): BNP in the last 8760 hours.  ProBNP (last 3 results) No results for input(s): PROBNP in the last 8760 hours.   Other Studies Reviewed Today:  Coronary Stent Intervention 02/2017  Conclusion     Mid RCA lesion, 90 %stenosed.  A STENT RESOLUTE ONYX 4.0X12 drug eluting stent was successfully placed.  Prox RCA to Mid RCA lesion, 90 %stenosed.  Post intervention, there is a 0% residual stenosis.   Successful stenting of the mid RCA with DES for in stent restenosis.  Plan: DAPT for one year with ASA and Plavix. May be suitable for same day discharge.      Myoview Study Highlights 01/2017    Nuclear stress EF: 37%.  There was no ST segment deviation noted during stress.  Defect 1: There is a large defect of moderate severity present in the basal inferior, basal inferolateral, basal anterolateral, mid inferior, mid inferolateral and apical inferior location.  Findings consistent with prior myocardial infarction with peri-infarct ischemia.  This is an intermediate risk study.  The left ventricular ejection fraction is moderately decreased (30-44%).  There is a prior infarct in the entire inferior and basal inferolateral walls and peri-infarct ischemia in the basal and mid inferolateral and basal anterolateral walls (SDS =4). LVEF is moderately decreased calculated at 37% with akinesis in the entire inferior and basal inferolateral walls.   Notes Recorded by [name], MD on 01/19/2017 at 3:38 PM EST EF down a lot, probably primarily infarct but think he should have cath. I would be glad to do it whenever, just touch base with Kevan Rosebush regarding timing if he is in agreement.  Echo Study Conclusions 12/2016  - Left ventricle: The cavity size was normal. Wall thickness was increased in a pattern of mild LVH. Systolic function was moderately reduced. The estimated ejection fraction was in the range of 35% to 40%. Diffuse hypokinesis. Doppler parameters are consistent with abnormal left ventricular relaxation (grade 1 diastolic dysfunction). - Mitral valve: Calcified annulus. Mildly thickened leaflets . Moderate prolapse. There was mild to moderate regurgitation.  Impressions:  - Moderate global reduction in LV systolic function; grade 1 diastolic dysfunction; mild LVH; prolapse of posterior MV leaflet; mild to moderate MR.  Notes Recorded by [name], MD on 01/12/2017 at 10:59 PM EST Would do Cardiolite given fall in EF (no cath if asymptomatic). Would make sure he is on Coreg and ACEI  Assessment / Plan:  1. CAD - recent worsening LV function -  referred for cardiac cath - now s/p PCI to the RCA - on DAPT for one year. He is doing well clinically. [place] lab today. Resume regular activities. Plan to repeat his echo in 3 months.   2. CAD: now s/p recent cath with PCI - see #1.   3. Smoking: says he is not smoking  4. Mitral regurgitation: Moderate, from mitral valve prolapse. Recent echo noted.   5. Hyperlipidemia: remains on statin therapy  6. PVCs on prior EKG - totally asymptomatic - back on his beta blocker.   7. Psyche disorder.    Current medicines are reviewed with the patient today.  The patient does not have concerns regarding medicines other than what has been noted above.  The following changes have been made:  See above.  Labs/ tests ordered today include:    Orders Placed This Encounter  Procedures  . Basic metabolic panel  . CBC  .  ECHOCARDIOGRAM COMPLETE     Disposition:   FU with me in 3 months after his echo. I think he looks good  today.   Patient is agreeable to this plan and will call if any problems develop in the interim.   SignedTruitt Merle, NP  02/18/2017 3:14 PM  [facility] [address] [place], [place]  [address] Phone: [phone] Fax: [phone]

## 2017-02-18 ENCOUNTER — Encounter: Payer: Self-pay | Admitting: Nurse Practitioner

## 2017-02-18 ENCOUNTER — Ambulatory Visit (INDEPENDENT_AMBULATORY_CARE_PROVIDER_SITE_OTHER): Payer: Medicare HMO | Admitting: Nurse Practitioner

## 2017-02-18 VITALS — BP 102/68 | HR 68 | Ht 69.0 in | Wt 185.8 lb

## 2017-02-18 DIAGNOSIS — Z955 Presence of coronary angioplasty implant and graft: Secondary | ICD-10-CM | POA: Diagnosis not present

## 2017-02-18 NOTE — Progress Notes (Signed)
Derrick [name], you can send him back to me after next visit with you.

## 2017-02-18 NOTE — Patient Instructions (Addendum)
We will be checking the following labs today - BMET and CBC   Medication Instructions:    Continue with your current medicines.     Testing/Procedures To Be Arranged:  Echo in 3 months (after June 1st)  Follow-Up:   See me a few days after the echocardiogram    Other Special Instructions:   Ok to resume your regular activities    If you need a refill on your cardiac medications before your next appointment, please call your pharmacy.   Call the [place] office at [phone] if you have any questions, problems or concerns.

## 2017-02-19 LAB — BASIC METABOLIC PANEL
BUN/Creatinine Ratio: 15 (ref 9–20)
BUN: 12 mg/dL (ref 6–24)
CO2: 26 mmol/L (ref 18–29)
Calcium: 8.9 mg/dL (ref 8.7–10.2)
Chloride: 98 mmol/L (ref 96–106)
Creatinine, Ser: 0.81 mg/dL (ref 0.76–1.27)
GFR calc Af Amer: 112 mL/min/{1.73_m2} (ref >59)
GFR calc non Af Amer: 97 mL/min/{1.73_m2} (ref >59)
Glucose: 105 mg/dL — ABNORMAL HIGH (ref 65–99)
Potassium: 4.5 mmol/L (ref 3.5–5.2)
Sodium: 140 mmol/L (ref 134–144)

## 2017-02-19 LAB — CBC
Hematocrit: 40.9 % (ref 37.5–51.0)
Hemoglobin: 14.2 g/dL (ref 13.0–17.7)
MCH: 32.3 pg (ref 26.6–33.0)
MCHC: 34.7 g/dL (ref 31.5–35.7)
MCV: 93 fL (ref 79–97)
Platelets: 185 10*3/uL (ref 150–379)
RBC: 4.4 x10E6/uL (ref 4.14–5.80)
RDW: 13.8 % (ref 12.3–15.4)
WBC: 6.7 10*3/uL (ref 3.4–10.8)

## 2017-02-27 DIAGNOSIS — H11002 Unspecified pterygium of left eye: Secondary | ICD-10-CM | POA: Diagnosis not present

## 2017-02-27 DIAGNOSIS — H527 Unspecified disorder of refraction: Secondary | ICD-10-CM | POA: Diagnosis not present

## 2017-02-27 DIAGNOSIS — H2513 Age-related nuclear cataract, bilateral: Secondary | ICD-10-CM | POA: Diagnosis not present

## 2017-03-02 DIAGNOSIS — F3175 Bipolar disorder, in partial remission, most recent episode depressed: Secondary | ICD-10-CM | POA: Diagnosis not present

## 2017-03-06 DIAGNOSIS — F3175 Bipolar disorder, in partial remission, most recent episode depressed: Secondary | ICD-10-CM | POA: Diagnosis not present

## 2017-04-08 DIAGNOSIS — H5210 Myopia, unspecified eye: Secondary | ICD-10-CM | POA: Diagnosis not present

## 2017-04-08 DIAGNOSIS — I10 Essential (primary) hypertension: Secondary | ICD-10-CM | POA: Diagnosis not present

## 2017-04-15 ENCOUNTER — Other Ambulatory Visit: Payer: Medicare HMO

## 2017-04-22 DIAGNOSIS — F3175 Bipolar disorder, in partial remission, most recent episode depressed: Secondary | ICD-10-CM | POA: Diagnosis not present

## 2017-05-13 ENCOUNTER — Encounter: Payer: Self-pay | Admitting: *Deleted

## 2017-05-18 ENCOUNTER — Other Ambulatory Visit (HOSPITAL_COMMUNITY): Payer: Medicare HMO

## 2017-05-25 ENCOUNTER — Ambulatory Visit: Payer: Medicare HMO | Admitting: Nurse Practitioner

## 2017-05-25 DIAGNOSIS — F3175 Bipolar disorder, in partial remission, most recent episode depressed: Secondary | ICD-10-CM | POA: Diagnosis not present

## 2017-05-27 ENCOUNTER — Encounter: Payer: Self-pay | Admitting: Physician Assistant

## 2017-05-27 ENCOUNTER — Ambulatory Visit (INDEPENDENT_AMBULATORY_CARE_PROVIDER_SITE_OTHER): Payer: Medicare HMO | Admitting: Physician Assistant

## 2017-05-27 VITALS — BP 102/52 | HR 72 | Temp 96.2°F | Ht 69.0 in | Wt 186.0 lb

## 2017-05-27 DIAGNOSIS — Z9189 Other specified personal risk factors, not elsewhere classified: Secondary | ICD-10-CM | POA: Diagnosis not present

## 2017-05-27 DIAGNOSIS — L03818 Cellulitis of other sites: Secondary | ICD-10-CM | POA: Diagnosis not present

## 2017-05-27 DIAGNOSIS — Z23 Encounter for immunization: Secondary | ICD-10-CM

## 2017-05-27 MED ORDER — CLINDAMYCIN HCL 300 MG PO CAPS: 300.0000 mg | ORAL_CAPSULE | Freq: Three times a day (TID) | ORAL | 0 refills | Status: DC

## 2017-05-27 NOTE — Addendum Note (Signed)
Addended by: Rolena Infante on: 05/27/2017 04:53 PM   Modules accepted: Orders

## 2017-05-27 NOTE — Progress Notes (Signed)
BP (!) 102/52   Pulse 72   Temp (!) 96.2 F (35.7 C) (Oral)   Ht 5\' 9"  (1.753 m)   Wt 186 lb (84.4 kg)   BMI 27.47 kg/m    Subjective:    Patient ID: Derrick [name], male    DOB: 08-Feb-1956, 61 y.o.   MRN: [id number]  HPI: Derrick [name] is a 61 y.o. male presenting on 05/27/2017 for left foot swollen Approximately 2 weeks ago the patient had an incident where he was kayaking. He was barefoot. He was going out into a [place] area. He cut his feet on some shells. There were several mildly feet lacerations that did bleed. They did not need sutures. Since then he has had slightly increased swelling in the left foot and was concerned about infection. The wounds have healed nicely and are almost completely closed.  Relevant past medical, surgical, family and social history reviewed and updated as indicated. Allergies and medications reviewed and updated.  Past Medical History:  Diagnosis Date  . Anxiety   . Bipolar 1 disorder ([place])   . BPH (benign prostatic hypertrophy)   . CAD (coronary artery disease), native coronary artery    a. DES x 2 to RCA in 2004 b. 02/11/17 PCI w/DES x1 to mRCA  . Depression   . Mitral regurgitation and aortic stenosis   . MVP (mitral valve prolapse)   . OSA (obstructive sleep apnea) 03/22/2013    Past Surgical History:  Procedure Laterality Date  . CORONARY STENT INTERVENTION N/A 02/12/2017   Procedure: Coronary Stent Intervention;  Surgeon: [name], MD;  Location: [facility];  Service: Cardiovascular;  Laterality: N/A;  . LEFT HEART CATH AND CORONARY ANGIOGRAPHY N/A 02/12/2017   Procedure: Left Heart Cath and Coronary Angiography;  Surgeon: [name], MD;  Location: [facility];  Service: Cardiovascular;  Laterality: N/A;    Review of Systems  Constitutional: Negative.  Negative for appetite change and fatigue.  Eyes: Negative for pain and visual disturbance.  Respiratory: Negative.  Negative for cough, chest tightness,  shortness of breath and wheezing.   Cardiovascular: Negative.  Negative for chest pain, palpitations and leg swelling.  Gastrointestinal: Negative.  Negative for abdominal pain, diarrhea, nausea and vomiting.  Genitourinary: Negative.   Musculoskeletal: Positive for joint swelling.  Skin: Positive for wound. Negative for color change, pallor and rash.  Neurological: Negative.  Negative for weakness, numbness and headaches.  Psychiatric/Behavioral: Negative.     Allergies as of 05/27/2017   No Known Allergies     Medication List       Accurate as of 05/27/17  9:38 AM. Always use your most recent med list.          acetaminophen 500 MG tablet Commonly known as:  TYLENOL Take 500 mg by mouth every 6 (six) hours as needed for mild pain.   aspirin 81 MG tablet Take 81 mg by mouth daily.   atorvastatin 80 MG tablet Commonly known as:  LIPITOR TAKE 1 TABLET  DAILY   BESIVANCE 0.6 % Susp Generic drug:  Besifloxacin HCl Place 1 drop into both eyes 3 (three) times daily.   buPROPion 150 MG 24 hr tablet Commonly known as:  WELLBUTRIN XL Take 450 mg by mouth daily.   clindamycin 300 MG capsule Commonly known as:  CLEOCIN Take 1 capsule (300 mg total) by mouth 3 (three) times daily.   clopidogrel 75 MG tablet Commonly known as:  PLAVIX Take  1 tablet (75 mg total) by mouth daily.   divalproex 500 MG DR tablet Commonly known as:  DEPAKOTE Take 2,500 mg by mouth at bedtime.   escitalopram 10 MG tablet Commonly known as:  LEXAPRO Take 10 mg by mouth daily.   hydrOXYzine 10 MG tablet Commonly known as:  ATARAX/VISTARIL Take 1 tablet (10 mg total) by mouth 3 (three) times daily as needed.   lamoTRIgine 200 MG tablet Commonly known as:  LAMICTAL Take 200 mg by mouth daily.   metoprolol tartrate 25 MG tablet Commonly known as:  LOPRESSOR TAKE 1 TABLET TWICE DAILY   montelukast 10 MG tablet Commonly known as:  SINGULAIR TAKE 1 TABLET EVERY DAY   nitroGLYCERIN 0.4 MG  SL tablet Commonly known as:  NITROSTAT Place 1 tablet (0.4 mg total) under the tongue every 5 (five) minutes as needed for chest pain.   tamsulosin 0.4 MG Caps capsule Commonly known as:  FLOMAX TAKE 1 CAPSULE EVERY DAY          Objective:    BP (!) 102/52   Pulse 72   Temp (!) 96.2 F (35.7 C) (Oral)   Ht 5\' 9"  (1.753 m)   Wt 186 lb (84.4 kg)   BMI 27.47 kg/m   No Known Allergies  Physical Exam  Constitutional: He appears well-developed and well-nourished. No distress.  HENT:  Head: Normocephalic and atraumatic.  Eyes: Conjunctivae and EOM are normal. Pupils are equal, round, and reactive to light.  Cardiovascular: Normal rate, regular rhythm and normal heart sounds.   Pulmonary/Chest: Effort normal and breath sounds normal. No respiratory distress.  Musculoskeletal:       Feet:  Mild edema in the left foot. Healing lacerations on the plantar surface  Skin: Skin is warm and dry.  Psychiatric: He has a normal mood and affect. His behavior is normal.  Nursing note and vitals reviewed.       Assessment & Plan:   1. Cellulitis of other specified site - clindamycin (CLEOCIN) 300 MG capsule; Take 1 capsule (300 mg total) by mouth 3 (three) times daily.  Dispense: 30 capsule; Refill: 0  2. At high risk for contamination - clindamycin (CLEOCIN) 300 MG capsule; Take 1 capsule (300 mg total) by mouth 3 (three) times daily.  Dispense: 30 capsule; Refill: 0  CONSIDER Tdap injection due to history of open wound.   Current Outpatient Prescriptions:  .  acetaminophen (TYLENOL) 500 MG tablet, Take 500 mg by mouth every 6 (six) hours as needed for mild pain. , Disp: , Rfl:  .  aspirin 81 MG tablet, Take 81 mg by mouth daily., Disp: , Rfl:  .  atorvastatin (LIPITOR) 80 MG tablet, TAKE 1 TABLET  DAILY, Disp: 90 tablet, Rfl: 3 .  BESIVANCE 0.6 % SUSP, Place 1 drop into both eyes 3 (three) times daily., Disp: , Rfl:  .  buPROPion (WELLBUTRIN XL) 150 MG 24 hr tablet, Take 450 mg  by mouth daily. , Disp: , Rfl:  .  clopidogrel (PLAVIX) 75 MG tablet, Take 1 tablet (75 mg total) by mouth daily., Disp: 30 tablet, Rfl: 0 .  divalproex (DEPAKOTE) 500 MG DR tablet, Take 2,500 mg by mouth at bedtime., Disp: , Rfl:  .  escitalopram (LEXAPRO) 10 MG tablet, Take 10 mg by mouth daily. , Disp: , Rfl:  .  hydrOXYzine (ATARAX/VISTARIL) 10 MG tablet, Take 1 tablet (10 mg total) by mouth 3 (three) times daily as needed. (Patient taking differently: Take 10 mg by mouth  3 (three) times daily as needed for anxiety. ), Disp: 30 tablet, Rfl: 0 .  lamoTRIgine (LAMICTAL) 200 MG tablet, Take 200 mg by mouth daily. , Disp: , Rfl:  .  metoprolol tartrate (LOPRESSOR) 25 MG tablet, TAKE 1 TABLET TWICE DAILY, Disp: 60 tablet, Rfl: 11 .  montelukast (SINGULAIR) 10 MG tablet, TAKE 1 TABLET EVERY DAY, Disp: 90 tablet, Rfl: 0 .  nitroGLYCERIN (NITROSTAT) 0.4 MG SL tablet, Place 1 tablet (0.4 mg total) under the tongue every 5 (five) minutes as needed for chest pain., Disp: 25 tablet, Rfl: 3 .  tamsulosin (FLOMAX) 0.4 MG CAPS capsule, TAKE 1 CAPSULE EVERY DAY, Disp: 90 capsule, Rfl: 0 .  clindamycin (CLEOCIN) 300 MG capsule, Take 1 capsule (300 mg total) by mouth 3 (three) times daily., Disp: 30 capsule, Rfl: 0  Continue all other maintenance medications as listed above.  Follow up plan: Return if symptoms worsen or fail to improve.  Educational handout given for cellulitis  [name] [name] [place] [address]  [place], [place] [address] [phone]   05/27/2017, 9:38 AM

## 2017-05-27 NOTE — Patient Instructions (Signed)

## 2017-06-02 ENCOUNTER — Ambulatory Visit: Payer: Medicare HMO | Admitting: Nurse Practitioner

## 2017-06-04 ENCOUNTER — Ambulatory Visit (HOSPITAL_COMMUNITY): Payer: Medicare HMO | Attending: Cardiology

## 2017-06-04 ENCOUNTER — Other Ambulatory Visit: Payer: Self-pay

## 2017-06-04 DIAGNOSIS — I2510 Atherosclerotic heart disease of native coronary artery without angina pectoris: Secondary | ICD-10-CM | POA: Diagnosis not present

## 2017-06-04 DIAGNOSIS — I252 Old myocardial infarction: Secondary | ICD-10-CM | POA: Diagnosis not present

## 2017-06-04 DIAGNOSIS — I341 Nonrheumatic mitral (valve) prolapse: Secondary | ICD-10-CM | POA: Insufficient documentation

## 2017-06-04 DIAGNOSIS — E785 Hyperlipidemia, unspecified: Secondary | ICD-10-CM | POA: Insufficient documentation

## 2017-06-04 DIAGNOSIS — Z955 Presence of coronary angioplasty implant and graft: Secondary | ICD-10-CM | POA: Diagnosis not present

## 2017-06-04 DIAGNOSIS — I340 Nonrheumatic mitral (valve) insufficiency: Secondary | ICD-10-CM | POA: Insufficient documentation

## 2017-06-04 LAB — ECHOCARDIOGRAM COMPLETE
Ao-asc: 35 cm
E decel time: 373 msec
E/e' ratio: 7.32
FS: 21 % — AB (ref 28–44)
IVS/LV PW RATIO, ED: 0.9
LA ID, A-P, ES: 38 mm
LA diam end sys: 38 mm
LA diam index: 1.9 cm/m2
LA vol A4C: 38.4 ml
LA vol index: 19.7 mL/m2
LA vol: 39.4 mL
LV E/e' medial: 7.32
LV E/e'average: 7.32
LV PW d: 10 mm — AB (ref 0.6–1.1)
LV e' LATERAL: 8.16 cm/s
LVOT SV: 74 mL
LVOT VTI: 17.8 cm
LVOT area: 4.15 cm2
LVOT diameter: 23 mm
LVOT peak vel: 82.8 cm/s
Lateral S' vel: 8.7 cm/s
MV Dec: 373
MV pk A vel: 47.5 m/s
MV pk E vel: 59.7 m/s
PISA EROA: 0.21 cm2
TAPSE: 19.9 mm
TDI e' lateral: 8.16
TDI e' medial: 5.44
VTI: 205 cm

## 2017-06-05 ENCOUNTER — Telehealth: Payer: Self-pay | Admitting: Nurse Practitioner

## 2017-06-05 NOTE — Telephone Encounter (Signed)
°  Follow Up   Pt returning call regarding Echocardiogram results. Please call.

## 2017-06-08 ENCOUNTER — Other Ambulatory Visit: Payer: Self-pay | Admitting: Nurse Practitioner

## 2017-06-09 ENCOUNTER — Ambulatory Visit (INDEPENDENT_AMBULATORY_CARE_PROVIDER_SITE_OTHER): Payer: Medicare HMO | Admitting: Nurse Practitioner

## 2017-06-09 ENCOUNTER — Encounter: Payer: Self-pay | Admitting: Nurse Practitioner

## 2017-06-09 VITALS — BP 118/68 | HR 74 | Ht 69.0 in | Wt 183.4 lb

## 2017-06-09 DIAGNOSIS — I5022 Chronic systolic (congestive) heart failure: Secondary | ICD-10-CM | POA: Diagnosis not present

## 2017-06-09 DIAGNOSIS — I259 Chronic ischemic heart disease, unspecified: Secondary | ICD-10-CM

## 2017-06-09 DIAGNOSIS — E7800 Pure hypercholesterolemia, unspecified: Secondary | ICD-10-CM

## 2017-06-09 DIAGNOSIS — R0789 Other chest pain: Secondary | ICD-10-CM

## 2017-06-09 MED ORDER — ISOSORBIDE MONONITRATE ER 30 MG PO TB24: 30.0000 mg | ORAL_TABLET | Freq: Every day | ORAL | 3 refills | Status: DC

## 2017-06-09 MED ORDER — PANTOPRAZOLE SODIUM 40 MG PO TBEC: 40.0000 mg | DELAYED_RELEASE_TABLET | Freq: Every day | ORAL | 3 refills | Status: DC

## 2017-06-09 MED ORDER — ISOSORBIDE MONONITRATE ER 30 MG PO TB24: 15.0000 mg | ORAL_TABLET | Freq: Every day | ORAL | 3 refills | Status: DC

## 2017-06-09 MED ORDER — METOPROLOL TARTRATE 25 MG PO TABS: ORAL_TABLET | ORAL | 11 refills | Status: DC

## 2017-06-09 NOTE — Progress Notes (Signed)
CARDIOLOGY OFFICE NOTE  Date:  06/09/2017    Derrick [name] Date of Birth: 1956/09/18 Medical Record #[id number]  PCP:  [name], [name]  Cardiologist:  Annabell Howells   Chief Complaint  Patient presents with  . Coronary Artery Disease    Follow up visit - seen for Dr. [name]    History of Present Illness: Derrick [name] is a 61 y.o. male who presents today for a follow up visit. Seen for Dr. [name].   He has a history of CAD s/p inferior STEMI with DES x 2 to RCA in 2004 and moderate MR with mitral valve prolapse. His other issues include tobacco abuse, bipolar disorder, OSA and HLD.  Echo from 11/2015with EF of 45 to 50%, moderateLVH, MVP with mild to moderate MR.  Seen for first time by Dr. [name] back in 2014. Cardiac status appeared stable but was short of breath - ordered a Myoview, PFTs and BNP. BNP was 18. Low risk Myoview noted. PFTs without marked abnormality.   I have seen him back over the past few years - I saw him back in January of 2018 - had had some eye surgery and was told he had a "skip" in his heart. He was asymptomatic. Had been out of his beta blocker for at least 6 months - I restarted it. Got his echo updated - EF down more - reviewed with Dr. [name] - set up for Myoview - larger scar noted. Cardiac cath recommended - ended up having PCI. Last seen back in March and he was doing well - plan was to update his echo 3 months post PCI.   Comes back today. Here with his wife. Doing ok. He first says he has had no chest pain but notes he has had some "indigestion" - described as a burning sensation and feels like this was reflux - used Alka seltzer one time with relief. But he used NTG three times one day later and says this helped but it came back - this was about a week ago. He can't remember what he had ate - but did not feel like it from what he had eaten. His breathing has been fine. Had probably 6 spells over a week's time - none over the past  week. Tells me "it was so bad". "Hurt so bad". He walks every day - no problems whatsoever. Has not recurred over the last week. Did not get short of breath, sweaty, clammy - just this burning sensation - would last about 10 minutes. He had to lie on the floor, "beat on his chest" and get in a position where he would press up against his chest for it to feel better.  He is fatigued - but on several medicines which could be causing. Little swelling in the left ankle - typically goes down over night. Not short of breath. Weight is stable.   Past Medical History:  Diagnosis Date  . Anxiety   . Bipolar 1 disorder ([place])   . BPH (benign prostatic hypertrophy)   . CAD (coronary artery disease), native coronary artery    a. DES x 2 to RCA in 2004 b. 02/11/17 PCI w/DES x1 to mRCA  . Depression   . Mitral regurgitation and aortic stenosis   . MVP (mitral valve prolapse)   . OSA (obstructive sleep apnea) 03/22/2013    Past Surgical History:  Procedure Laterality Date  . CORONARY STENT INTERVENTION N/A 02/12/2017   Procedure: Coronary Stent  Intervention;  Surgeon: [name], MD;  Location: [facility];  Service: Cardiovascular;  Laterality: N/A;  . LEFT HEART CATH AND CORONARY ANGIOGRAPHY N/A 02/12/2017   Procedure: Left Heart Cath and Coronary Angiography;  Surgeon: [name], MD;  Location: [facility];  Service: Cardiovascular;  Laterality: N/A;     Medications: Current Meds  Medication Sig  . acetaminophen (TYLENOL) 500 MG tablet Take 500 mg by mouth every 6 (six) hours as needed for mild pain.   [name] ALPRAZolam (XANAX) 1 MG tablet Take 1 mg by mouth every morning.   [name] aspirin 81 MG tablet Take 81 mg by mouth daily.  [name] atorvastatin (LIPITOR) 80 MG tablet TAKE 1 TABLET  DAILY  . buPROPion (WELLBUTRIN XL) 150 MG 24 hr tablet Take 450 mg by mouth daily.   . clindamycin (CLEOCIN) 300 MG capsule Take 1 capsule (300 mg total) by mouth 3 (three) times daily.  . clopidogrel (PLAVIX)  75 MG tablet Take 1 tablet (75 mg total) by mouth daily.  . divalproex (DEPAKOTE) 500 MG DR tablet Take 2,500 mg by mouth at bedtime.  [name] escitalopram (LEXAPRO) 10 MG tablet Take 10 mg by mouth daily.   . hydrOXYzine (ATARAX/VISTARIL) 10 MG tablet Take 1 tablet (10 mg total) by mouth 3 (three) times daily as needed. (Patient taking differently: Take 10 mg by mouth 3 (three) times daily as needed for anxiety. )  . lamoTRIgine (LAMICTAL) 200 MG tablet Take 200 mg by mouth daily.   . metoprolol tartrate (LOPRESSOR) 25 MG tablet TAKE 1 1/2 TABLETS TWICE DAILY  . montelukast (SINGULAIR) 10 MG tablet Take 1 Tablet by mouth once daily  . nitroGLYCERIN (NITROSTAT) 0.4 MG SL tablet Place 1 tablet (0.4 mg total) under the tongue every 5 (five) minutes as needed for chest pain.  . tamsulosin (FLOMAX) 0.4 MG CAPS capsule TAKE 1 CAPSULE EVERY DAY  . [DISCONTINUED] BESIVANCE 0.6 % SUSP Place 1 drop into both eyes 3 (three) times daily.  . [DISCONTINUED] metoprolol tartrate (LOPRESSOR) 25 MG tablet TAKE 1 TABLET TWICE DAILY     Allergies: No Known Allergies  Social History: The patient  reports that he quit smoking about 14 years ago. His smoking use included Cigarettes. He has never used smokeless tobacco. He reports that he does not drink alcohol or use drugs.   Family History: The patient's family history includes COPD in his father; Heart disease in his brother and mother.   Review of Systems: Please see the history of present illness.   Otherwise, the review of systems is positive for none.   All other systems are reviewed and negative.   Physical Exam: VS:  BP 118/68 (BP Location: Left Arm, Patient Position: Sitting, Cuff Size: Normal)   Pulse 74   Ht 5\' 9"  (1.753 m)   Wt 183 lb 6.4 oz (83.2 kg)   SpO2 98% Comment: at rest  BMI 27.08 kg/m  .  BMI Body mass index is 27.08 kg/m.  Wt Readings from Last 3 Encounters:  06/09/17 183 lb 6.4 oz (83.2 kg)  05/27/17 186 lb (84.4 kg)  02/18/17 185  lb 12.8 oz (84.3 kg)    General: Pleasant. Well developed, well nourished and in no acute distress.   HEENT: Normal.  Neck: Supple, no JVD, carotid bruits, or masses noted.  Cardiac: Regular rate and rhythm. No murmurs, rubs, or gallops. No edema on my exam.  Respiratory:  Lungs are clear to auscultation bilaterally with normal  work of breathing.  GI: Soft and nontender.  MS: No deformity or atrophy. Gait and ROM intact.  Skin: Warm and dry. Color is normal.  Neuro:  Strength and sensation are intact and no gross focal deficits noted.  Psych: Alert, appropriate and with normal affect.   LABORATORY DATA:  EKG:  EKG is ordered today. This shows NSR with frequent PVCs noted.   Lab Results  Component Value Date   WBC 6.7 02/18/2017   HGB 14.2 02/18/2017   HCT 40.9 02/18/2017   PLT 185 02/18/2017   GLUCOSE 105 (H) 02/18/2017   CHOL 144 01/12/2017   TRIG 361 (H) 01/12/2017   HDL 36 (L) 01/12/2017   LDLCALC 36 01/12/2017   ALT 18 01/12/2017   AST 15 01/12/2017   NA 140 02/18/2017   K 4.5 02/18/2017   CL 98 02/18/2017   CREATININE 0.81 02/18/2017   BUN 12 02/18/2017   CO2 26 02/18/2017   TSH 3.360 07/19/2014   INR 1.0 02/09/2017     BNP (last 3 results) No results for input(s): BNP in the last 8760 hours.  ProBNP (last 3 results) No results for input(s): PROBNP in the last 8760 hours.   Other Studies Reviewed Today:   Echo Study Conclusions 05/2017  - Left ventricle: The cavity size was normal. Wall thickness was   normal. The estimated ejection fraction was 50%. Diffuse   hypokinesis. Doppler parameters are consistent with abnormal left   ventricular relaxation (grade 1 diastolic dysfunction). - Aortic valve: There was no stenosis. - Mitral valve: Mildly calcified annulus. Bileaflet mitral valve   prolapse. There was mild to moderate late systolic regurgitation. - Right ventricle: The cavity size was normal. Systolic function   was normal. - Pulmonary arteries:  No complete TR doppler jet so unable to   estimate PA systolic pressure. - Inferior vena cava: The vessel was normal in size. The   respirophasic diameter changes were in the normal range (>= 50%),   consistent with normal central venous pressure.  Impressions:  - Normal LV size with EF 50%, mild diffuse hypokinesis. Normal RV   size and systolic function. Bileaflet mitral valve prolapse with   mild to moderate late systolic mitral regurgitation.   Coronary Stent Intervention 02/2017  Conclusion     Mid RCA lesion, 90 %stenosed.  A STENT RESOLUTE ONYX 4.0X12 drug eluting stent was successfully placed.  Prox RCA to Mid RCA lesion, 90 %stenosed.  Post intervention, there is a 0% residual stenosis.  Successful stenting of the mid RCA with DES for in stent restenosis.  Plan: DAPT for one year with ASA and Plavix. May be suitable for same day discharge.      Myoview Study Highlights 01/2017    Nuclear stress EF: 37%.  There was no ST segment deviation noted during stress.  Defect 1: There is a large defect of moderate severity present in the basal inferior, basal inferolateral, basal anterolateral, mid inferior, mid inferolateral and apical inferior location.  Findings consistent with prior myocardial infarction with peri-infarct ischemia.  This is an intermediate risk study.  The left ventricular ejection fraction is moderately decreased (30-44%).  There is a prior infarct in the entire inferior and basal inferolateral walls and peri-infarct ischemia in the basal and mid inferolateral and basal anterolateral walls (SDS =4). LVEF is moderately decreased calculated at 37% with akinesis in the entire inferior and basal inferolateral walls.   Notes Recorded by [name], MD on 01/19/2017 at 3:38 PM  EST EF down a lot, probably primarily infarct but think he should have cath. I would be glad to do it whenever, just touch base with Kevan Rosebush regarding  timing if he is in agreement.  Echo Study Conclusions 12/2016  - Left ventricle: The cavity size was normal. Wall thickness was increased in a pattern of mild LVH. Systolic function was moderately reduced. The estimated ejection fraction was in the range of 35% to 40%. Diffuse hypokinesis. Doppler parameters are consistent with abnormal left ventricular relaxation (grade 1 diastolic dysfunction). - Mitral valve: Calcified annulus. Mildly thickened leaflets . Moderate prolapse. There was mild to moderate regurgitation.  Impressions:  - Moderate global reduction in LV systolic function; grade 1 diastolic dysfunction; mild LVH; prolapse of posterior MV leaflet; mild to moderate MR.  Notes Recorded by [name], MD on 01/12/2017 at 10:59 PM EST Would do Cardiolite given fall in EF (no cath if asymptomatic). Would make sure he is on Coreg and ACEI  Assessment / Plan:  1. CAD - recent worsening LV function - prior cardiac cath - now s/p PCI to the RCA from March of 2018 - on DAPT for one year. His echo has improved. He has had about a week's worthof chest burning - unclear etiology. PVCs on EKG today noted. He did not have chest pain with this most recent stent - this was picked up by Myoview after having fall in EF on echo. Will add PPI, add low dose Imdur and increase the Metoprolol. Lab today. He needs to let us know if he has recurrent symptoms which may warrant further testing.   2. CAD: now s/p recent cath with PCI - see #1.   3. Smoking: says he is not smoking  4. Mitral regurgitation: Moderate, from mitral valve prolapse. Recent echo noted. EF has improved.   5. Hyperlipidemia: remains on statin therapy - he is not fasting today - will recheck on return.   6. PVCs on prior EKG -he is not aware - EF has improved - he is on hisbeta blocker. This could be causing his chest pain - will increase the Metoprolol. See back as planned.   7. Psyche  disorder.    Current medicines are reviewed with the patient today.  The patient does not have concerns regarding medicines other than what has been noted above.  The following changes have been made:  See above.  Labs/ tests ordered today include:    Orders Placed This Encounter  Procedures  . Basic metabolic panel  . CBC  . EKG 12-Lead     Disposition:   FU with me in 3 weeks with fasting lipids.   Patient is agreeable to this plan and will call if any problems develop in the interim.   SignedTruitt Merle, NP  06/09/2017 10:09 AM  [facility] [address] [place], [place]  [address] Phone: [phone] Fax: [phone]

## 2017-06-09 NOTE — Patient Instructions (Addendum)
We will be checking the following labs today - BMET and CBC   Medication Instructions:    Continue with your current medicines. BUT  I am adding Protonix 40 mg a day - this is for acid reflux - take one a day  I am adding Imdur 30 mg a day - this is for heart pain - take just a half a pill each day - this may cause a headache  I am increasing your Metoprolol to 1 1/2 pills twice a day - this is to help smooth out your heart beat.     Testing/Procedures To Be Arranged:  N/A  Follow-Up:   See me in 3 weeks - but call if you have recurrent symptoms. We will check fasting lipids at that visit.     Other Special Instructions:  Use your NTG under your tongue for recurrent chest pain. May take one tablet every 5 minutes. If you are still having discomfort after 3 tablets in 15 minutes, call 911.    If you need a refill on your cardiac medications before your next appointment, please call your pharmacy.   Call the [place] office at [phone] if you have any questions, problems or concerns.

## 2017-06-10 ENCOUNTER — Other Ambulatory Visit: Payer: Self-pay | Admitting: Cardiology

## 2017-06-10 LAB — CBC
Hematocrit: 41.3 % (ref 37.5–51.0)
Hemoglobin: 13.8 g/dL (ref 13.0–17.7)
MCH: 32.3 pg (ref 26.6–33.0)
MCHC: 33.4 g/dL (ref 31.5–35.7)
MCV: 97 fL (ref 79–97)
Platelets: 193 10*3/uL (ref 150–379)
RBC: 4.27 x10E6/uL (ref 4.14–5.80)
RDW: 13.3 % (ref 12.3–15.4)
WBC: 6.8 10*3/uL (ref 3.4–10.8)

## 2017-06-10 LAB — BASIC METABOLIC PANEL
BUN/Creatinine Ratio: 10 (ref 9–20)
BUN: 9 mg/dL (ref 6–24)
CO2: 24 mmol/L (ref 20–29)
Calcium: 9 mg/dL (ref 8.7–10.2)
Chloride: 104 mmol/L (ref 96–106)
Creatinine, Ser: 0.93 mg/dL (ref 0.76–1.27)
GFR calc Af Amer: 104 mL/min/{1.73_m2} (ref >59)
GFR calc non Af Amer: 90 mL/min/{1.73_m2} (ref >59)
Glucose: 88 mg/dL (ref 65–99)
Potassium: 4.6 mmol/L (ref 3.5–5.2)
Sodium: 140 mmol/L (ref 134–144)

## 2017-06-23 ENCOUNTER — Ambulatory Visit: Payer: Medicare HMO | Admitting: Nurse Practitioner

## 2017-07-13 DIAGNOSIS — F3175 Bipolar disorder, in partial remission, most recent episode depressed: Secondary | ICD-10-CM | POA: Diagnosis not present

## 2017-07-27 ENCOUNTER — Ambulatory Visit: Payer: Medicare HMO | Admitting: Nurse Practitioner

## 2017-08-03 DIAGNOSIS — F3175 Bipolar disorder, in partial remission, most recent episode depressed: Secondary | ICD-10-CM | POA: Diagnosis not present

## 2017-08-24 ENCOUNTER — Ambulatory Visit (INDEPENDENT_AMBULATORY_CARE_PROVIDER_SITE_OTHER): Payer: Medicare HMO | Admitting: Nurse Practitioner

## 2017-08-24 ENCOUNTER — Encounter: Payer: Self-pay | Admitting: Nurse Practitioner

## 2017-08-24 VITALS — BP 118/70 | HR 76 | Ht 70.0 in | Wt 181.1 lb

## 2017-08-24 DIAGNOSIS — R9431 Abnormal electrocardiogram [ECG] [EKG]: Secondary | ICD-10-CM | POA: Diagnosis not present

## 2017-08-24 DIAGNOSIS — R079 Chest pain, unspecified: Secondary | ICD-10-CM

## 2017-08-24 DIAGNOSIS — I259 Chronic ischemic heart disease, unspecified: Secondary | ICD-10-CM | POA: Diagnosis not present

## 2017-08-24 DIAGNOSIS — E7800 Pure hypercholesterolemia, unspecified: Secondary | ICD-10-CM | POA: Diagnosis not present

## 2017-08-24 LAB — HEPATIC FUNCTION PANEL
ALT: 19 IU/L (ref 0–44)
AST: 17 IU/L (ref 0–40)
Albumin: 4.6 g/dL (ref 3.5–5.5)
Alkaline Phosphatase: 53 IU/L (ref 39–117)
Bilirubin Total: 0.4 mg/dL (ref 0.0–1.2)
Bilirubin, Direct: 0.11 mg/dL (ref 0.00–0.40)
Total Protein: 6.4 g/dL (ref 6.0–8.5)

## 2017-08-24 LAB — LIPID PANEL
Chol/HDL Ratio: 3.4 ratio (ref 0.0–5.0)
Cholesterol, Total: 129 mg/dL (ref 100–199)
HDL: 38 mg/dL — ABNORMAL LOW (ref >39)
LDL Calculated: 50 mg/dL (ref 0–99)
Triglycerides: 206 mg/dL — ABNORMAL HIGH (ref 0–149)
VLDL Cholesterol Cal: 41 mg/dL — ABNORMAL HIGH (ref 5–40)

## 2017-08-24 LAB — BASIC METABOLIC PANEL
BUN/Creatinine Ratio: 10 (ref 9–20)
BUN: 10 mg/dL (ref 6–24)
CO2: 26 mmol/L (ref 20–29)
Calcium: 10.1 mg/dL (ref 8.7–10.2)
Chloride: 102 mmol/L (ref 96–106)
Creatinine, Ser: 0.96 mg/dL (ref 0.76–1.27)
GFR calc Af Amer: 100 mL/min/{1.73_m2} (ref >59)
GFR calc non Af Amer: 86 mL/min/{1.73_m2} (ref >59)
Glucose: 76 mg/dL (ref 65–99)
Potassium: 4.4 mmol/L (ref 3.5–5.2)
Sodium: 143 mmol/L (ref 134–144)

## 2017-08-24 LAB — CBC
Hematocrit: 44.3 % (ref 37.5–51.0)
Hemoglobin: 15.4 g/dL (ref 13.0–17.7)
MCH: 32.2 pg (ref 26.6–33.0)
MCHC: 34.8 g/dL (ref 31.5–35.7)
MCV: 93 fL (ref 79–97)
Platelets: 184 10*3/uL (ref 150–379)
RBC: 4.78 x10E6/uL (ref 4.14–5.80)
RDW: 13.4 % (ref 12.3–15.4)
WBC: 8.5 10*3/uL (ref 3.4–10.8)

## 2017-08-24 LAB — TROPONIN T: Troponin T TROPT: <0.011 ng/mL (ref <0.011)

## 2017-08-24 NOTE — Patient Instructions (Addendum)
We will be checking the following labs today - BMET, CBC, HPF, lipids and a stat Troponin   Medication Instructions:    Continue with your current medicines.     Testing/Procedures To Be Arranged:  Stress myoview  Follow-Up:   We will see how your stress test turns out and then decide about follow up visit    Other Special Instructions:   N/A    If you need a refill on your cardiac medications before your next appointment, please call your pharmacy.   Call the [place] office at [phone] if you have any questions, problems or concerns.

## 2017-08-24 NOTE — Progress Notes (Signed)
CARDIOLOGY OFFICE NOTE  Date:  08/24/2017    Derrick [name] Date of Birth: 07-30-1956 Medical Record #[id number]  PCP:  [name], [name]  Cardiologist:  Annabell Howells  Chief Complaint  Patient presents with  . Chest Pain    Follow up visit - seen for Dr. [name]    History of Present Illness: Derrick [name] is a 61 y.o. male who presents today for a follow up visit. Seen for Dr. [name].   He has a history of CAD s/p inferior STEMI with DES x 2 to RCA in 2004 and moderate MR with mitral valve prolapse. His other issues include tobacco abuse, bipolar disorder, OSA and HLD.  Echo from 11/2015with EF of 45 to 50%, moderateLVH, MVP with mild to moderate MR.  Seen for first time by Dr. [name] back in 2014. Cardiac status appeared stable but was short of breath - ordered a Myoview, PFTs and BNP. BNP was 18. Low risk Myoview noted. PFTs without marked abnormality.   I have seen him back over the past few years - I saw him back in January of 2018 - had had some eye surgery and was told he had a "skip" in his heart. He was asymptomatic. Had been out of his beta blocker for at least 6 months - I restarted it. Got his echo updated - EF down more - reviewed with Dr. [name] - set up for Myoview - larger scar noted. Cardiac cath recommended - ended up having PCI. Last seen back in March and he was doing well - plan was to update his echo 3 months post PCI.   I then saw him back in June - some "indigestion" reported - but had used NTG with relief.   Comes back today. Here alone. Has continued to have a few episodes of chest pain - had some last month while at the beach - took NTG x 4. Had a "bad spell" this morning - on the right side of his chest - moved around and radiated to all over his chest and jaw - very "zapped now". Both episodes occurred at rest. Painfree now. Started on B12 last week on his own. He is walking every day - no problems whatsoever. Tries to walk for about an  hour - slow pace - every day. Not short of breath.  Taking his medicines. He remains on PPI therapy - tells me his diet he says is quite spicy and probably greasy. Still has his gallbladder. Tells me of some "gene" that he has - his daughter apparently had and she has died from ovarian cancer - says he was told to get a colonoscopy every 5 years - he admits to being too reluctant to follow thru. He admits to smoking "some". Admits he could be doing better in general with his regular health maintenance.   Past Medical History:  Diagnosis Date  . Anxiety   . Bipolar 1 disorder ([place])   . BPH (benign prostatic hypertrophy)   . CAD (coronary artery disease), native coronary artery    a. DES x 2 to RCA in 2004 b. 02/11/17 PCI w/DES x1 to mRCA  . Depression   . Mitral regurgitation and aortic stenosis   . MVP (mitral valve prolapse)   . OSA (obstructive sleep apnea) 03/22/2013    Past Surgical History:  Procedure Laterality Date  . CORONARY STENT INTERVENTION N/A 02/12/2017   Procedure: Coronary Stent Intervention;  Surgeon: [name], MD;  Location: [facility];  Service: Cardiovascular;  Laterality: N/A;  . LEFT HEART CATH AND CORONARY ANGIOGRAPHY N/A 02/12/2017   Procedure: Left Heart Cath and Coronary Angiography;  Surgeon: [name], MD;  Location: [facility];  Service: Cardiovascular;  Laterality: N/A;     Medications: Current Meds  Medication Sig  . acetaminophen (TYLENOL) 500 MG tablet Take 500 mg by mouth every 6 (six) hours as needed for mild pain.   [name] ALPRAZolam (XANAX) 1 MG tablet Take 1 mg by mouth every morning.   [name] aspirin 81 MG tablet Take 81 mg by mouth daily.  [name] atorvastatin (LIPITOR) 80 MG tablet TAKE 1 TABLET  DAILY  . buPROPion (WELLBUTRIN XL) 150 MG 24 hr tablet Take 450 mg by mouth daily.   . clindamycin (CLEOCIN) 300 MG capsule Take 1 capsule (300 mg total) by mouth 3 (three) times daily.  . clopidogrel (PLAVIX) 75 MG tablet Take 1 Tablet by mouth  once daily with BREAKFAST  . divalproex (DEPAKOTE) 500 MG DR tablet Take 2,500 mg by mouth at bedtime.  [name] escitalopram (LEXAPRO) 10 MG tablet Take 10 mg by mouth daily.   . hydrOXYzine (ATARAX/VISTARIL) 10 MG tablet Take 1 tablet (10 mg total) by mouth 3 (three) times daily as needed. (Patient taking differently: Take 10 mg by mouth 3 (three) times daily as needed for anxiety. )  . isosorbide mononitrate (IMDUR) 30 MG 24 hr tablet Take 0.5 tablets (15 mg total) by mouth daily.  [name] lamoTRIgine (LAMICTAL) 200 MG tablet Take 200 mg by mouth daily.   . metoprolol tartrate (LOPRESSOR) 25 MG tablet TAKE 1 1/2 TABLETS TWICE DAILY  . montelukast (SINGULAIR) 10 MG tablet Take 1 Tablet by mouth once daily  . nitroGLYCERIN (NITROSTAT) 0.4 MG SL tablet Place 1 tablet (0.4 mg total) under the tongue every 5 (five) minutes as needed for chest pain.  . pantoprazole (PROTONIX) 40 MG tablet Take 1 tablet (40 mg total) by mouth daily.  . tamsulosin (FLOMAX) 0.4 MG CAPS capsule TAKE 1 CAPSULE EVERY DAY     Allergies: No Known Allergies  Social History: The patient  reports that he quit smoking about 14 years ago. His smoking use included Cigarettes. He has never used smokeless tobacco. He reports that he does not drink alcohol or use drugs.   Family History: The patient's family history includes COPD in his father; Heart disease in his brother and mother.   Review of Systems: Please see the history of present illness.   Otherwise, the review of systems is positive for none.   All other systems are reviewed and negative.   Physical Exam: VS:  BP 118/70   Pulse 76   Ht 5\' 10"  (1.778 m)   Wt 181 lb 1.6 oz (82.1 kg)   BMI 25.99 kg/m  .  BMI Body mass index is 25.99 kg/m.  Wt Readings from Last 3 Encounters:  08/24/17 181 lb 1.6 oz (82.1 kg)  06/09/17 183 lb 6.4 oz (83.2 kg)  05/27/17 186 lb (84.4 kg)    General: Pleasant. Alert and in no acute distress. Affect is a little flat.   HEENT: Normal.    Neck: Supple, no JVD, carotid bruits, or masses noted.  Cardiac: Regular rate and rhythm. No murmurs, rubs, or gallops. No edema.  Respiratory:  Lungs are clear to auscultation bilaterally with normal work of breathing.  GI: Soft and nontender.  MS: No deformity or atrophy. Gait and ROM intact.  Skin:  Warm and dry. Color is normal.  Neuro:  Strength and sensation are intact and no gross focal deficits noted.  Psych: Alert, appropriate and with normal affect.   LABORATORY DATA:  EKG:  EKG is ordered today. This demonstrates NSR - he has chronic inferior Q's.  Lab Results  Component Value Date   WBC 6.8 06/09/2017   HGB 13.8 06/09/2017   HCT 41.3 06/09/2017   PLT 193 06/09/2017   GLUCOSE 88 06/09/2017   CHOL 144 01/12/2017   TRIG 361 (H) 01/12/2017   HDL 36 (L) 01/12/2017   LDLCALC 36 01/12/2017   ALT 18 01/12/2017   AST 15 01/12/2017   NA 140 06/09/2017   K 4.6 06/09/2017   CL 104 06/09/2017   CREATININE 0.93 06/09/2017   BUN 9 06/09/2017   CO2 24 06/09/2017   TSH 3.360 07/19/2014   INR 1.0 02/09/2017     BNP (last 3 results) No results for input(s): BNP in the last 8760 hours.  ProBNP (last 3 results) No results for input(s): PROBNP in the last 8760 hours.   Other Studies Reviewed Today:  Echo Study Conclusions 05/2017  - Left ventricle: The cavity size was normal. Wall thickness was normal. The estimated ejection fraction was 50%. Diffuse hypokinesis. Doppler parameters are consistent with abnormal left ventricular relaxation (grade 1 diastolic dysfunction). - Aortic valve: There was no stenosis. - Mitral valve: Mildly calcified annulus. Bileaflet mitral valve prolapse. There was mild to moderate late systolic regurgitation. - Right ventricle: The cavity size was normal. Systolic function was normal. - Pulmonary arteries: No complete TR doppler jet so unable to estimate PA systolic pressure. - Inferior vena cava: The vessel was normal in  size. The respirophasic diameter changes were in the normal range (>= 50%), consistent with normal central venous pressure.  Impressions:  - Normal LV size with EF 50%, mild diffuse hypokinesis. Normal RV size and systolic function. Bileaflet mitral valve prolapse with mild to moderate late systolic mitral regurgitation.   Coronary Stent Intervention 02/2017  Conclusion     Mid RCA lesion, 90 %stenosed.  A STENT RESOLUTE ONYX 4.0X12 drug eluting stent was successfully placed.  Prox RCA to Mid RCA lesion, 90 %stenosed.  Post intervention, there is a 0% residual stenosis.  Successful stenting of the mid RCA with DES for in stent restenosis.  Plan: DAPT for one year with ASA and Plavix. May be suitable for same day discharge.      Myoview Study Highlights 01/2017    Nuclear stress EF: 37%.  There was no ST segment deviation noted during stress.  Defect 1: There is a large defect of moderate severity present in the basal inferior, basal inferolateral, basal anterolateral, mid inferior, mid inferolateral and apical inferior location.  Findings consistent with prior myocardial infarction with peri-infarct ischemia.  This is an intermediate risk study.  The left ventricular ejection fraction is moderately decreased (30-44%).  There is a prior infarct in the entire inferior and basal inferolateral walls and peri-infarct ischemia in the basal and mid inferolateral and basal anterolateral walls (SDS =4). LVEF is moderately decreased calculated at 37% with akinesis in the entire inferior and basal inferolateral walls.   Notes Recorded by [name], MD on 01/19/2017 at 3:38 PM EST EF down a lot, probably primarily infarct but think he should have cath. I would be glad to do it whenever, just touch base with Kevan Rosebush regarding timing if he is in agreement.  Echo Study Conclusions 12/2016  -  Left ventricle: The cavity size was normal. Wall  thickness was increased in a pattern of mild LVH. Systolic function was moderately reduced. The estimated ejection fraction was in the range of 35% to 40%. Diffuse hypokinesis. Doppler parameters are consistent with abnormal left ventricular relaxation (grade 1 diastolic dysfunction). - Mitral valve: Calcified annulus. Mildly thickened leaflets . Moderate prolapse. There was mild to moderate regurgitation.  Impressions:  - Moderate global reduction in LV systolic function; grade 1 diastolic dysfunction; mild LVH; prolapse of posterior MV leaflet; mild to moderate MR.  Notes Recorded by [name], MD on 01/12/2017 at 10:59 PM EST Would do Cardiolite given fall in EF (no cath if asymptomatic). Would make sure he is on Coreg and ACEI  Assessment / Plan:  1. CAD - recent worsening LV function - prior cardiac cath - now s/p PCI to the RCA from March of 2018 - on DAPT for one year. His echo improved. He did not have chest pain with this most recent stent - this was picked up by Myoview after having fall in EF on echo. Now with recurrent bouts of chest pain - not exertional - he is on PPI and Imdur along with beta blocker. Has had a spell earlier today - EKG unchanged - will check troponin. Will repeat his Myoview. May need to consider GI evaluation. If Myoview abnormal - then will need repeat cath.   2. CAD: now s/p recent cath with PCI - see #1.   3. Smoking: he is smoking "some" - advised to totally abstain.   4. Mitral regurgitation: Moderate, from mitral valve prolapse. Recent echo noted. EF has improved.   5. Hyperlipidemia: remains on statin therapy - rechecking lab today  6. PVCs on prior EKG - does not endorse a sensation of palpitations.  7. Psyche disorder.    Current medicines are reviewed with the patient today.  The patient does not have concerns regarding medicines other than what has been noted above.  The following changes have been  made:  See above.  Labs/ tests ordered today include:    Orders Placed This Encounter  Procedures  . EKG 12-Lead     Disposition:   Further disposition pending results of his Myoview.   Patient is agreeable to this plan and will call if any problems develop in the interim.   SignedTruitt Merle, NP  08/24/2017 11:03 AM  [facility] [address] [place], [place]  [address] Phone: [phone] Fax: [phone]

## 2017-08-25 ENCOUNTER — Telehealth (HOSPITAL_COMMUNITY): Payer: Self-pay | Admitting: *Deleted

## 2017-08-25 NOTE — Telephone Encounter (Signed)
Left message on voicemail per DPR in reference to upcoming appointment scheduled on 08/28/17 at 0715 with detailed instructions given per Myocardial Perfusion Study Information Sheet for the test. LM to arrive 15 minutes early, and that it is imperative to arrive on time for appointment to keep from having the test rescheduled. If you need to cancel or reschedule your appointment, please call the office within 24 hours of your appointment. Failure to do so may result in a cancellation of your appointment, and a $50 no show fee. Phone number given for call back for any questions.

## 2017-08-28 ENCOUNTER — Ambulatory Visit (HOSPITAL_COMMUNITY): Payer: Medicare HMO | Attending: Internal Medicine

## 2017-08-28 DIAGNOSIS — I10 Essential (primary) hypertension: Secondary | ICD-10-CM | POA: Insufficient documentation

## 2017-08-28 DIAGNOSIS — R079 Chest pain, unspecified: Secondary | ICD-10-CM | POA: Diagnosis not present

## 2017-08-28 DIAGNOSIS — E7800 Pure hypercholesterolemia, unspecified: Secondary | ICD-10-CM | POA: Diagnosis not present

## 2017-08-28 DIAGNOSIS — I259 Chronic ischemic heart disease, unspecified: Secondary | ICD-10-CM

## 2017-08-28 DIAGNOSIS — R9431 Abnormal electrocardiogram [ECG] [EKG]: Secondary | ICD-10-CM

## 2017-08-28 DIAGNOSIS — I2510 Atherosclerotic heart disease of native coronary artery without angina pectoris: Secondary | ICD-10-CM | POA: Diagnosis not present

## 2017-08-28 DIAGNOSIS — R9439 Abnormal result of other cardiovascular function study: Secondary | ICD-10-CM | POA: Insufficient documentation

## 2017-08-28 LAB — MYOCARDIAL PERFUSION IMAGING
Estimated workload: 10.1 METS
Exercise duration (min): 9 min
Exercise duration (sec): 0 s
LV dias vol: 132 mL (ref 62–150)
LV sys vol: 84 mL
MPHR: 161 {beats}/min
Peak HR: 142 {beats}/min
Percent HR: 88 %
RATE: 0.33
Rest HR: 73 {beats}/min
SDS: 4
SRS: 7
SSS: 11
TID: 1.08

## 2017-08-28 MED ORDER — TECHNETIUM TC 99M TETROFOSMIN IV KIT
10.2000 | PACK | Freq: Once | INTRAVENOUS | Status: AC | PRN
  Administered 2017-08-28: 10.2 via INTRAVENOUS
  Filled 2017-08-28: qty 11

## 2017-08-28 MED ORDER — TECHNETIUM TC 99M TETROFOSMIN IV KIT
31.3000 | PACK | Freq: Once | INTRAVENOUS | Status: AC | PRN
  Administered 2017-08-28: 31.3 via INTRAVENOUS
  Filled 2017-08-28: qty 32

## 2017-08-31 DIAGNOSIS — F3175 Bipolar disorder, in partial remission, most recent episode depressed: Secondary | ICD-10-CM | POA: Diagnosis not present

## 2017-09-03 ENCOUNTER — Other Ambulatory Visit: Payer: Self-pay | Admitting: Nurse Practitioner

## 2017-09-04 ENCOUNTER — Other Ambulatory Visit: Payer: Self-pay | Admitting: Physician Assistant

## 2017-09-08 ENCOUNTER — Other Ambulatory Visit: Payer: Self-pay | Admitting: Nurse Practitioner

## 2017-09-28 DIAGNOSIS — F3175 Bipolar disorder, in partial remission, most recent episode depressed: Secondary | ICD-10-CM | POA: Diagnosis not present

## 2017-10-05 ENCOUNTER — Ambulatory Visit (INDEPENDENT_AMBULATORY_CARE_PROVIDER_SITE_OTHER): Payer: Medicare HMO | Admitting: Nurse Practitioner

## 2017-10-05 ENCOUNTER — Encounter: Payer: Self-pay | Admitting: Nurse Practitioner

## 2017-10-05 VITALS — BP 110/72 | HR 78 | Ht 70.0 in | Wt 185.4 lb

## 2017-10-05 DIAGNOSIS — I340 Nonrheumatic mitral (valve) insufficiency: Secondary | ICD-10-CM | POA: Diagnosis not present

## 2017-10-05 DIAGNOSIS — I259 Chronic ischemic heart disease, unspecified: Secondary | ICD-10-CM

## 2017-10-05 DIAGNOSIS — F3175 Bipolar disorder, in partial remission, most recent episode depressed: Secondary | ICD-10-CM | POA: Diagnosis not present

## 2017-10-05 DIAGNOSIS — Z79899 Other long term (current) drug therapy: Secondary | ICD-10-CM | POA: Diagnosis not present

## 2017-10-05 DIAGNOSIS — R079 Chest pain, unspecified: Secondary | ICD-10-CM

## 2017-10-05 DIAGNOSIS — I5022 Chronic systolic (congestive) heart failure: Secondary | ICD-10-CM | POA: Diagnosis not present

## 2017-10-05 NOTE — Progress Notes (Signed)
Agree with MRI.  Order it attention to me so that the techs will alert me when it is done to read.

## 2017-10-05 NOTE — Progress Notes (Signed)
CARDIOLOGY OFFICE NOTE  Date:  10/05/2017    [name] Date of Birth: 1956-03-13 Medical Record #[id number]  PCP:  [name], [name]  Cardiologist:  Annabell Howells    Chief Complaint  Patient presents with  . Cardiomyopathy  . Coronary Artery Disease    Follow up visit - seen for Dr. [name]    History of Present Illness: WELCOME FULTS is a 61 y.o. male who presents today for a one month check. Seen for Dr. [name].   He has a history of CAD s/p inferior STEMI with DES x 2 to RCA in 2004 and moderate MR with mitral valve prolapse. His other issues include tobacco abuse, bipolar disorder, OSA and HLD.  Echo from 11/2015with EF of 45 to 50%, moderateLVH, MVP with mild to moderate MR.  Seen for first time by Dr. [name] back in 2014. Cardiac status appeared stable but was short of breath - ordered a Myoview, PFTs and BNP. BNP was 18. Low risk Myoview noted. PFTs without marked abnormality.   I have seen him back over the past few years - I saw him back in January of 2018 - had had some eye surgery and was told he had a "skip" in his heart. He was asymptomatic. Had been out of his beta blocker for at least 6 months - I restarted it. Got his echo updated - EF down more - reviewed with Dr. [name] - set up for Myoview - larger scar noted. Cardiac cath recommended - ended up having PCI. Last seen back in March and he was doing well - plan was to update his echo 3 months post PCI.   I then saw him back in June - some "indigestion" reported - but had used NTG with relief. On follow up back in September he had continued to endorse some chest pain. Had been using NTG. Myoview updated - see below. Admitted to me that he could do better with his CV risk factor modification.   Comes back today. Here alone. He says he is doing really well. Says he has had no problem since the Myoview - no chest pain, not short of breath, no chest pressure - says "no nothing since my last visit  here". Does have headaches - he attributes to the Imdur. He has stopped his Lamictal and Lexapro - said these made him "not feel right" - thought he was on "too much dope". He did taper. He has psyche follow up later this morning at [facility].   Past Medical History:  Diagnosis Date  . Anxiety   . Bipolar 1 disorder ([place])   . BPH (benign prostatic hypertrophy)   . CAD (coronary artery disease), native coronary artery    a. DES x 2 to RCA in 2004 b. 02/11/17 PCI w/DES x1 to mRCA  . Depression   . Mitral regurgitation and aortic stenosis   . MVP (mitral valve prolapse)   . OSA (obstructive sleep apnea) 03/22/2013    Past Surgical History:  Procedure Laterality Date  . CORONARY STENT INTERVENTION N/A 02/12/2017   Procedure: Coronary Stent Intervention;  Surgeon: [name], MD;  Location: [facility];  Service: Cardiovascular;  Laterality: N/A;  . LEFT HEART CATH AND CORONARY ANGIOGRAPHY N/A 02/12/2017   Procedure: Left Heart Cath and Coronary Angiography;  Surgeon: [name], MD;  Location: [facility];  Service: Cardiovascular;  Laterality: N/A;     Medications: Current Meds  Medication Sig  . acetaminophen (TYLENOL) 500 MG tablet Take 500 mg by mouth every 6 (six) hours as needed for mild pain.   [name] ALPRAZolam (XANAX) 1 MG tablet Take 1 mg by mouth every morning.   [name] aspirin 81 MG tablet Take 81 mg by mouth daily.  [name] atorvastatin (LIPITOR) 80 MG tablet TAKE 1 TABLET  DAILY  . buPROPion (WELLBUTRIN XL) 150 MG 24 hr tablet Take 450 mg by mouth daily.   . cholecalciferol (VITAMIN D) 1000 units tablet Take 1,000 Int'l Units by mouth daily.  . clopidogrel (PLAVIX) 75 MG tablet Take 1 Tablet by mouth once daily with BREAKFAST  . divalproex (DEPAKOTE) 500 MG DR tablet Take 2,500 mg by mouth at bedtime.  . isosorbide mononitrate (IMDUR) 30 MG 24 hr tablet Take 0.5 tablets (15 mg total) by mouth daily.  . metoprolol tartrate (LOPRESSOR) 25 MG tablet TAKE 1 1/2 TABLETS  TWICE DAILY  . montelukast (SINGULAIR) 10 MG tablet Take 1 Tablet by mouth once daily  . nitroGLYCERIN (NITROSTAT) 0.4 MG SL tablet Place 1 tablet (0.4 mg total) under the tongue every 5 (five) minutes as needed for chest pain.  . pantoprazole (PROTONIX) 40 MG tablet Take 1 Tablet by mouth once daily  . tamsulosin (FLOMAX) 0.4 MG CAPS capsule TAKE 1 CAPSULE EVERY DAY     Allergies: No Known Allergies  Social History: The patient  reports that he has been smoking Cigarettes.  He has never used smokeless tobacco. He reports that he does not drink alcohol or use drugs.   Family History: The patient's family history includes COPD in his father; Heart disease in his brother and mother.   Review of Systems: Please see the history of present illness.   Otherwise, the review of systems is positive for none.   All other systems are reviewed and negative.   Physical Exam: VS:  BP 110/72   Pulse 78   Ht 5\' 10"  (1.778 m)   Wt 185 lb 6.4 oz (84.1 kg)   SpO2 96%   BMI 26.60 kg/m  .  BMI Body mass index is 26.6 kg/m.  Wt Readings from Last 3 Encounters:  10/05/17 185 lb 6.4 oz (84.1 kg)  08/24/17 181 lb 1.6 oz (82.1 kg)  06/09/17 183 lb 6.4 oz (83.2 kg)    General: Pleasant. He looks well. Very engaged with conversation. Alert and in no acute distress.   HEENT: Normal.  Neck: Supple, no JVD, carotid bruits, or masses noted.  Cardiac: Regular rate and rhythm. No murmurs, rubs, or gallops. No edema.  Respiratory:  Lungs are clear to auscultation bilaterally with normal work of breathing.  GI: Soft and nontender.  MS: No deformity or atrophy. Gait and ROM intact.  Skin: Warm and dry. Color is normal.  Neuro:  Strength and sensation are intact and no gross focal deficits noted.  Psych: Alert, appropriate and with normal affect.   LABORATORY DATA:  EKG:  EKG is not ordered today.  Lab Results  Component Value Date   WBC 8.5 08/24/2017   HGB 15.4 08/24/2017   HCT 44.3 08/24/2017    PLT 184 08/24/2017   GLUCOSE 76 08/24/2017   CHOL 129 08/24/2017   TRIG 206 (H) 08/24/2017   HDL 38 (L) 08/24/2017   LDLCALC 50 08/24/2017   ALT 19 08/24/2017   AST 17 08/24/2017   NA 143 08/24/2017   K 4.4 08/24/2017   CL 102 08/24/2017   CREATININE 0.96 08/24/2017   BUN  10 08/24/2017   CO2 26 08/24/2017   TSH 3.360 07/19/2014   INR 1.0 02/09/2017     BNP (last 3 results) No results for input(s): BNP in the last 8760 hours.  ProBNP (last 3 results) No results for input(s): PROBNP in the last 8760 hours.   Other Studies Reviewed Today:  Myoview Study Highlights 08/2017   Nuclear stress EF: 37%.  Blood pressure demonstrated a normal response to exercise.  There was no ST segment deviation noted during stress.  No T wave inversion was noted during stress.  Defect 1: There is a small defect of moderate severity present in the basal inferior and mid inferior location.  Findings consistent with prior myocardial infarction. No ischemia.  The left ventricular ejection fraction is moderately decreased (30-44%).     Notes recorded by Burtis Junes, NP on 09/01/2017 at 7:51 AM EDT Ok to report. Dr. [name] has reviewed his Myoview - EF while low - same as prior study - would like to see back in about a month to recheck him. May consider cardiac MRI to get a better sense of his pumping function. For now, stay on current regimen as outlined at his visit.   Echo Study Conclusions 05/2017  - Left ventricle: The cavity size was normal. Wall thickness was normal. The estimated ejection fraction was 50%. Diffuse hypokinesis. Doppler parameters are consistent with abnormal left ventricular relaxation (grade 1 diastolic dysfunction). - Aortic valve: There was no stenosis. - Mitral valve: Mildly calcified annulus. Bileaflet mitral valve prolapse. There was mild to moderate late systolic regurgitation. - Right ventricle: The cavity size was normal. Systolic  function was normal. - Pulmonary arteries: No complete TR doppler jet so unable to estimate PA systolic pressure. - Inferior vena cava: The vessel was normal in size. The respirophasic diameter changes were in the normal range (>= 50%), consistent with normal central venous pressure.  Impressions:  - Normal LV size with EF 50%, mild diffuse hypokinesis. Normal RV size and systolic function. Bileaflet mitral valve prolapse with mild to moderate late systolic mitral regurgitation.   Coronary Stent Intervention 02/2017  Conclusion     Mid RCA lesion, 90 %stenosed.  A STENT RESOLUTE ONYX 4.0X12 drug eluting stent was successfully placed.  Prox RCA to Mid RCA lesion, 90 %stenosed.  Post intervention, there is a 0% residual stenosis.  Successful stenting of the mid RCA with DES for in stent restenosis.  Plan: DAPT for one year with ASA and Plavix. May be suitable for same day discharge.      Assessment / Plan:  1. CAD - with prior worsening LV function - now s/p PCI to the RCA from March of 2018 - on DAPT for one year. His echo improved. He did not have chest pain with this most recent stent - this was picked up by Myoview after having fall in EF on echo. He had recurrent chest pain and we got a Myoview is stable (infarction noted and with low EF) - would favor continued medical management for his CAD for now. He is doing quite well.    2. Smoking: he is smoking "some" - advised to totally abstain.   3. Mitral regurgitation: Moderate, from mitral valve prolapse. Recent echo noted.    4. Hyperlipidemia: remains on statin therapy.   5. PVCs on prior EKG - does not endorse a sensation of palpitations.  6. Psyche disorder. Has follow up later today.   7. LV dysfunction - will consider cardiac MRI  to see what his EF is doing - he has no symptoms clinically and actually looks quite well today.     Current medicines are reviewed with the patient  today.  The patient does not have concerns regarding medicines other than what has been noted above.  The following changes have been made:  See above.  Labs/ tests ordered today include:    Orders Placed This Encounter  Procedures  . MR Card Morphology Wo/W Cm     Disposition:   FU with me in 3 to 4 months.   Patient is agreeable to this plan and will call if any problems develop in the interim.   SignedTruitt Merle, NP  10/05/2017 9:02 AM  [facility] [address] [place], [place]  [address] Phone: [phone] Fax: [phone]

## 2017-10-05 NOTE — Patient Instructions (Addendum)
We will be checking the following labs today - NONE   Medication Instructions:    Continue with your current medicines.     Testing/Procedures To Be Arranged:  Cardiac MRi - to evaluate LV function  Follow-Up:   See me in about 3 to 4 months - January 22,2019 at 8:30 am    Other Special Instructions:   N/A    If you need a refill on your cardiac medications before your next appointment, please call your pharmacy.   Call the [facility] office at [phone] if you have any questions, problems or concerns.

## 2017-10-05 NOTE — Progress Notes (Signed)
Schedulers have been alerted to route to Dr. [name] for reading.

## 2017-10-06 ENCOUNTER — Telehealth: Payer: Self-pay | Admitting: Nurse Practitioner

## 2017-10-06 NOTE — Telephone Encounter (Signed)
Called the patient and LVM for him to call me back with his preference for his cardiac MRI.

## 2017-10-09 ENCOUNTER — Encounter: Payer: Self-pay | Admitting: Nurse Practitioner

## 2017-10-12 ENCOUNTER — Encounter: Payer: Self-pay | Admitting: Nurse Practitioner

## 2017-10-12 ENCOUNTER — Other Ambulatory Visit: Payer: Self-pay | Admitting: Nurse Practitioner

## 2017-10-13 NOTE — Telephone Encounter (Signed)
Medication Detail    Disp Refills Start End   isosorbide mononitrate (IMDUR) 30 MG 24 hr tablet 45 tablet 3 09/09/2017    Sig - Route: Take 0.5 tablets (15 mg total) by mouth daily. - Oral   Sent to pharmacy as: isosorbide mononitrate (IMDUR) 30 MG 24 hr tablet   Notes to Pharmacy: This prescription was filled on 09/08/2017. Any refills authorized will be placed on file.   E-Prescribing Status: Receipt confirmed by pharmacy (09/09/2017 9:46 AM EDT)   Pharmacy   [facility] [name], [place]

## 2017-10-22 ENCOUNTER — Ambulatory Visit: Payer: Medicare HMO

## 2017-10-23 ENCOUNTER — Ambulatory Visit (HOSPITAL_COMMUNITY)
Admission: RE | Admit: 2017-10-23 | Discharge: 2017-10-23 | Disposition: A | Discharge status: Home / Self Care | Payer: Medicare HMO | Source: Ambulatory Visit | Attending: Nurse Practitioner | Admitting: Nurse Practitioner

## 2017-10-23 DIAGNOSIS — I341 Nonrheumatic mitral (valve) prolapse: Secondary | ICD-10-CM | POA: Insufficient documentation

## 2017-10-23 DIAGNOSIS — I259 Chronic ischemic heart disease, unspecified: Secondary | ICD-10-CM

## 2017-10-23 DIAGNOSIS — I255 Ischemic cardiomyopathy: Secondary | ICD-10-CM | POA: Insufficient documentation

## 2017-10-23 DIAGNOSIS — I5022 Chronic systolic (congestive) heart failure: Secondary | ICD-10-CM | POA: Diagnosis not present

## 2017-10-23 LAB — CREATININE, SERUM
Creatinine, Ser: 0.96 mg/dL (ref 0.61–1.24)
GFR calc Af Amer: >60 mL/min (ref >60)
GFR calc non Af Amer: >60 mL/min (ref >60)

## 2017-10-23 MED ORDER — GADOBENATE DIMEGLUMINE 529 MG/ML IV SOLN
26.0000 mL | Freq: Once | INTRAVENOUS | Status: AC | PRN
  Administered 2017-10-23: 26 mL via INTRAVENOUS

## 2017-10-26 DIAGNOSIS — F3175 Bipolar disorder, in partial remission, most recent episode depressed: Secondary | ICD-10-CM | POA: Diagnosis not present

## 2017-10-28 ENCOUNTER — Ambulatory Visit (INDEPENDENT_AMBULATORY_CARE_PROVIDER_SITE_OTHER): Payer: Medicare HMO | Admitting: *Deleted

## 2017-10-28 DIAGNOSIS — Z23 Encounter for immunization: Secondary | ICD-10-CM

## 2017-11-02 DIAGNOSIS — Z79899 Other long term (current) drug therapy: Secondary | ICD-10-CM | POA: Diagnosis not present

## 2017-11-02 DIAGNOSIS — F3175 Bipolar disorder, in partial remission, most recent episode depressed: Secondary | ICD-10-CM | POA: Diagnosis not present

## 2017-11-27 DIAGNOSIS — F3175 Bipolar disorder, in partial remission, most recent episode depressed: Secondary | ICD-10-CM | POA: Diagnosis not present

## 2017-12-16 DIAGNOSIS — F3175 Bipolar disorder, in partial remission, most recent episode depressed: Secondary | ICD-10-CM | POA: Diagnosis not present

## 2017-12-31 DIAGNOSIS — F3175 Bipolar disorder, in partial remission, most recent episode depressed: Secondary | ICD-10-CM | POA: Diagnosis not present

## 2018-01-05 ENCOUNTER — Ambulatory Visit: Payer: Medicare HMO | Admitting: Nurse Practitioner

## 2018-01-13 ENCOUNTER — Telehealth: Payer: Self-pay | Admitting: Nurse Practitioner

## 2018-01-13 ENCOUNTER — Other Ambulatory Visit: Payer: Self-pay | Admitting: *Deleted

## 2018-01-13 MED ORDER — PANTOPRAZOLE SODIUM 40 MG PO TBEC: 40.0000 mg | DELAYED_RELEASE_TABLET | Freq: Every day | ORAL | 2 refills | Status: DC

## 2018-01-13 MED ORDER — METOPROLOL TARTRATE 25 MG PO TABS: ORAL_TABLET | ORAL | 2 refills | Status: DC

## 2018-01-13 NOTE — Telephone Encounter (Signed)
°*  STAT* If patient is at the pharmacy, call can be transferred to refill team.   1. Which medications need to be refilled? (please list name of each medication and dose if known) Metoprolol Tartrate 25 mg and Pantoprazole 40 mg  2. Which pharmacy/location (including street and city if local pharmacy) is medication to be sent to?[place], [place]  3. Do they need a 30 day or 90 day supply? 90 day

## 2018-01-14 ENCOUNTER — Encounter: Payer: Self-pay | Admitting: Physician Assistant

## 2018-01-14 ENCOUNTER — Ambulatory Visit (INDEPENDENT_AMBULATORY_CARE_PROVIDER_SITE_OTHER): Payer: Medicare HMO | Admitting: Physician Assistant

## 2018-01-14 VITALS — BP 135/85 | HR 108 | Temp 97.4°F | Ht 70.0 in | Wt 189.4 lb

## 2018-01-14 DIAGNOSIS — J111 Influenza due to unidentified influenza virus with other respiratory manifestations: Secondary | ICD-10-CM | POA: Diagnosis not present

## 2018-01-14 LAB — VERITOR FLU A/B WAIVED
INFLUENZA A: NEGATIVE
INFLUENZA B: NEGATIVE

## 2018-01-14 MED ORDER — OSELTAMIVIR PHOSPHATE 75 MG PO CAPS
75.0000 mg | ORAL_CAPSULE | Freq: Two times a day (BID) | ORAL | 0 refills | Status: DC
Start: 2018-01-14 — End: 2018-07-07

## 2018-01-14 NOTE — Patient Instructions (Signed)
-   Take meds as prescribed - Use a cool mist humidifier  -Use saline nose sprays frequently -Saline irrigations of the nose can be very helpful if done frequently.  * 4X daily for 1 week*  * Use of a nettie pot can be helpful with this. Follow directions with this* -Force fluids -For any cough or congestion  Use plain Mucinex- regular strength or max strength is fine   * Children- consult with Pharmacist for dosing -For fever or aces or pains- take tylenol or ibuprofen appropriate for age and weight.  * for fevers greater than 101 orally you may alternate ibuprofen and tylenol every  3 hours. -Throat lozenges if help -New toothbrush in 3 days

## 2018-01-18 NOTE — Progress Notes (Signed)
BP 135/85   Pulse (!) 108   Temp (!) 97.4 F (36.3 C) (Oral)   Ht 5\' 10"  (1.778 m)   Wt 189 lb 6.4 oz (85.9 kg)   BMI 27.18 kg/m    Subjective:    Patient ID: Derrick [name], male    DOB: 03/24/1956, 62 y.o.   MRN: [id number]  HPI: Derrick [name] is a 62 y.o. male presenting on 01/14/2018 for Fatigue  This patient has had less than 2 days severe fever, chills, myalgias.  Complains of sinus headache and postnasal drainage. There is copious drainage at times. Associated sore throat, decreased appetite and headache.  Has been exposed to influenza.   Relevant past medical, surgical, family and social history reviewed and updated as indicated. Allergies and medications reviewed and updated.  Past Medical History:  Diagnosis Date  . Anxiety   . Bipolar 1 disorder ([place])   . BPH (benign prostatic hypertrophy)   . CAD (coronary artery disease), native coronary artery    a. DES x 2 to RCA in 2004 b. 02/11/17 PCI w/DES x1 to mRCA  . Depression   . Mitral regurgitation and aortic stenosis   . MVP (mitral valve prolapse)   . OSA (obstructive sleep apnea) 03/22/2013    Past Surgical History:  Procedure Laterality Date  . CORONARY STENT INTERVENTION N/A 02/12/2017   Procedure: Coronary Stent Intervention;  Surgeon: [name], MD;  Location: [facility];  Service: Cardiovascular;  Laterality: N/A;  . LEFT HEART CATH AND CORONARY ANGIOGRAPHY N/A 02/12/2017   Procedure: Left Heart Cath and Coronary Angiography;  Surgeon: [name], MD;  Location: [facility];  Service: Cardiovascular;  Laterality: N/A;    Review of Systems  Constitutional: Positive for activity change, fatigue and fever. Negative for appetite change.  HENT: Positive for congestion and sore throat. Negative for sinus pressure.   Eyes: Negative.  Negative for pain and visual disturbance.  Respiratory: Negative for cough, chest tightness, shortness of breath and wheezing.   Cardiovascular: Negative.   Negative for chest pain, palpitations and leg swelling.  Gastrointestinal: Positive for nausea. Negative for abdominal pain, diarrhea and vomiting.  Endocrine: Negative.   Genitourinary: Negative.   Musculoskeletal: Positive for back pain and myalgias. Negative for arthralgias.  Skin: Negative.  Negative for color change and rash.  Neurological: Positive for headaches. Negative for weakness and numbness.  Psychiatric/Behavioral: Negative.     Allergies as of 01/14/2018   No Known Allergies     Medication List        Accurate as of 01/14/18 11:59 PM. Always use your most recent med list.          acetaminophen 500 MG tablet Commonly known as:  TYLENOL Take 500 mg by mouth every 6 (six) hours as needed for mild pain.   ALPRAZolam 1 MG tablet Commonly known as:  XANAX Take 1 mg by mouth every morning.   aspirin 81 MG tablet Take 81 mg by mouth daily.   atorvastatin 80 MG tablet Commonly known as:  LIPITOR TAKE 1 TABLET  DAILY   buPROPion 150 MG 24 hr tablet Commonly known as:  WELLBUTRIN XL Take 450 mg by mouth daily.   cholecalciferol 1000 units tablet Commonly known as:  VITAMIN D Take 1,000 Int'l Units by mouth daily.   clopidogrel 75 MG tablet Commonly known as:  PLAVIX Take 1 Tablet by mouth once daily with BREAKFAST   divalproex 500 MG DR tablet Commonly  known as:  DEPAKOTE Take 2,500 mg by mouth at bedtime.   isosorbide mononitrate 30 MG 24 hr tablet Commonly known as:  IMDUR Take 0.5 tablets (15 mg total) by mouth daily.   metoprolol tartrate 25 MG tablet Commonly known as:  LOPRESSOR TAKE 1 1/2 TABLETS BY MOUTH TWICE DAILY   montelukast 10 MG tablet Commonly known as:  SINGULAIR Take 1 Tablet by mouth once daily   nitroGLYCERIN 0.4 MG SL tablet Commonly known as:  NITROSTAT Place 1 tablet (0.4 mg total) under the tongue every 5 (five) minutes as needed for chest pain.   oseltamivir 75 MG capsule Commonly known as:  TAMIFLU Take 1 capsule (75  mg total) by mouth 2 (two) times daily.   pantoprazole 40 MG tablet Commonly known as:  PROTONIX Take 1 tablet (40 mg total) by mouth daily.   tamsulosin 0.4 MG Caps capsule Commonly known as:  FLOMAX TAKE 1 CAPSULE EVERY DAY          Objective:    BP 135/85   Pulse (!) 108   Temp (!) 97.4 F (36.3 C) (Oral)   Ht 5\' 10"  (1.778 m)   Wt 189 lb 6.4 oz (85.9 kg)   BMI 27.18 kg/m   No Known Allergies  Physical Exam  Constitutional: He is oriented to person, place, and time. He appears well-developed and well-nourished. No distress.  HENT:  Head: Normocephalic and atraumatic.  Right Ear: Tympanic membrane normal. No drainage. No middle ear effusion.  Left Ear: Tympanic membrane normal. No drainage.  No middle ear effusion.  Nose: Mucosal edema and rhinorrhea present. Right sinus exhibits no maxillary sinus tenderness. Left sinus exhibits no maxillary sinus tenderness.  Mouth/Throat: Uvula is midline. Posterior oropharyngeal erythema present. No oropharyngeal exudate.  Eyes: Conjunctivae and EOM are normal. Pupils are equal, round, and reactive to light. Right eye exhibits no discharge. Left eye exhibits no discharge.  Neck: Normal range of motion.  Cardiovascular: Normal rate, regular rhythm and normal heart sounds.  Pulmonary/Chest: Effort normal and breath sounds normal. No respiratory distress. He has no wheezes.  Abdominal: Soft.  Lymphadenopathy:    He has no cervical adenopathy.  Neurological: He is alert and oriented to person, place, and time.  Skin: Skin is warm and dry.  Psychiatric: He has a normal mood and affect. His behavior is normal.  Nursing note and vitals reviewed.   Results for orders placed or performed in visit on 01/14/18  Veritor Flu A/B Waived  Result Value Ref Range   Influenza A Negative Negative   Influenza B Negative Negative      Assessment & Plan:   1. Influenza - Veritor Flu A/B Waived - oseltamivir (TAMIFLU) 75 MG capsule; Take 1  capsule (75 mg total) by mouth 2 (two) times daily.  Dispense: 10 capsule; Refill: 0    Current Outpatient Medications:  .  acetaminophen (TYLENOL) 500 MG tablet, Take 500 mg by mouth every 6 (six) hours as needed for mild pain. , Disp: , Rfl:  .  ALPRAZolam (XANAX) 1 MG tablet, Take 1 mg by mouth every morning. , Disp: , Rfl:  .  aspirin 81 MG tablet, Take 81 mg by mouth daily., Disp: , Rfl:  .  atorvastatin (LIPITOR) 80 MG tablet, TAKE 1 TABLET  DAILY, Disp: 90 tablet, Rfl: 3 .  buPROPion (WELLBUTRIN XL) 150 MG 24 hr tablet, Take 450 mg by mouth daily. , Disp: , Rfl:  .  cholecalciferol (VITAMIN D) 1000 units tablet,  Take 1,000 Int'l Units by mouth daily., Disp: , Rfl:  .  clopidogrel (PLAVIX) 75 MG tablet, Take 1 Tablet by mouth once daily with BREAKFAST, Disp: 90 tablet, Rfl: 3 .  divalproex (DEPAKOTE) 500 MG DR tablet, Take 2,500 mg by mouth at bedtime., Disp: , Rfl:  .  isosorbide mononitrate (IMDUR) 30 MG 24 hr tablet, Take 0.5 tablets (15 mg total) by mouth daily., Disp: 45 tablet, Rfl: 3 .  metoprolol tartrate (LOPRESSOR) 25 MG tablet, TAKE 1 1/2 TABLETS BY MOUTH TWICE DAILY, Disp: 270 tablet, Rfl: 2 .  montelukast (SINGULAIR) 10 MG tablet, Take 1 Tablet by mouth once daily, Disp: 90 tablet, Rfl: 0 .  nitroGLYCERIN (NITROSTAT) 0.4 MG SL tablet, Place 1 tablet (0.4 mg total) under the tongue every 5 (five) minutes as needed for chest pain., Disp: 25 tablet, Rfl: 3 .  oseltamivir (TAMIFLU) 75 MG capsule, Take 1 capsule (75 mg total) by mouth 2 (two) times daily., Disp: 10 capsule, Rfl: 0 .  pantoprazole (PROTONIX) 40 MG tablet, Take 1 tablet (40 mg total) by mouth daily., Disp: 90 tablet, Rfl: 2 .  tamsulosin (FLOMAX) 0.4 MG CAPS capsule, TAKE 1 CAPSULE EVERY DAY, Disp: 90 capsule, Rfl: 0 Continue all other maintenance medications as listed above.  Follow up plan: Return if symptoms worsen or fail to improve.  Educational handout given for [place] [name] [facility] [address]  [place], [place] [address] [phone]   01/18/2018, 8:18 AM \

## 2018-01-19 ENCOUNTER — Ambulatory Visit: Payer: Medicare HMO | Admitting: Nurse Practitioner

## 2018-01-19 ENCOUNTER — Encounter: Payer: Self-pay | Admitting: Nurse Practitioner

## 2018-01-19 VITALS — BP 110/80 | HR 73 | Ht 70.0 in | Wt 188.0 lb

## 2018-01-19 DIAGNOSIS — I5022 Chronic systolic (congestive) heart failure: Secondary | ICD-10-CM | POA: Diagnosis not present

## 2018-01-19 DIAGNOSIS — I259 Chronic ischemic heart disease, unspecified: Secondary | ICD-10-CM | POA: Diagnosis not present

## 2018-01-19 DIAGNOSIS — E7800 Pure hypercholesterolemia, unspecified: Secondary | ICD-10-CM

## 2018-01-19 DIAGNOSIS — R079 Chest pain, unspecified: Secondary | ICD-10-CM

## 2018-01-19 LAB — LIPID PANEL
Chol/HDL Ratio: 4.2 ratio (ref 0.0–5.0)
Cholesterol, Total: 137 mg/dL (ref 100–199)
HDL: 33 mg/dL — ABNORMAL LOW (ref >39)
LDL Calculated: 59 mg/dL (ref 0–99)
Triglycerides: 224 mg/dL — ABNORMAL HIGH (ref 0–149)
VLDL Cholesterol Cal: 45 mg/dL — ABNORMAL HIGH (ref 5–40)

## 2018-01-19 LAB — BASIC METABOLIC PANEL
BUN/Creatinine Ratio: 13 (ref 10–24)
BUN: 13 mg/dL (ref 8–27)
CO2: 24 mmol/L (ref 20–29)
Calcium: 9.4 mg/dL (ref 8.6–10.2)
Chloride: 103 mmol/L (ref 96–106)
Creatinine, Ser: 0.99 mg/dL (ref 0.76–1.27)
GFR calc Af Amer: 95 mL/min/{1.73_m2} (ref >59)
GFR calc non Af Amer: 82 mL/min/{1.73_m2} (ref >59)
Glucose: 97 mg/dL (ref 65–99)
Potassium: 4.4 mmol/L (ref 3.5–5.2)
Sodium: 140 mmol/L (ref 134–144)

## 2018-01-19 LAB — CBC
Hematocrit: 40.9 % (ref 37.5–51.0)
Hemoglobin: 14.4 g/dL (ref 13.0–17.7)
MCH: 33 pg (ref 26.6–33.0)
MCHC: 35.2 g/dL (ref 31.5–35.7)
MCV: 94 fL (ref 79–97)
Platelets: 216 10*3/uL (ref 150–379)
RBC: 4.36 x10E6/uL (ref 4.14–5.80)
RDW: 13.6 % (ref 12.3–15.4)
WBC: 7 10*3/uL (ref 3.4–10.8)

## 2018-01-19 LAB — HEPATIC FUNCTION PANEL
ALT: 29 IU/L (ref 0–44)
AST: 26 IU/L (ref 0–40)
Albumin: 4.3 g/dL (ref 3.6–4.8)
Alkaline Phosphatase: 64 IU/L (ref 39–117)
Bilirubin Total: 0.3 mg/dL (ref 0.0–1.2)
Bilirubin, Direct: 0.1 mg/dL (ref 0.00–0.40)
Total Protein: 6.6 g/dL (ref 6.0–8.5)

## 2018-01-19 MED ORDER — CLOPIDOGREL BISULFATE 75 MG PO TABS: ORAL_TABLET | ORAL | 0 refills | Status: DC

## 2018-01-19 NOTE — Patient Instructions (Addendum)
We will be checking the following labs today - BMET, CBC, HPF and lipids   Medication Instructions:    Continue with your current medicines. BUT  Ok to stop the Plavix as of March 1st - I sent in a RX for 10 pills to your pharmacy  Stay on your aspirin    Testing/Procedures To Be Arranged:  N/A  Follow-Up:   See me in 4 months    Other Special Instructions:   N/A    If you need a refill on your cardiac medications before your next appointment, please call your pharmacy.   Call the [place] office at [phone] if you have any questions, problems or concerns.

## 2018-01-19 NOTE — Progress Notes (Signed)
CARDIOLOGY OFFICE NOTE  Date:  01/19/2018    Derrick [name] Date of Birth: 07-20-1956 Medical Record #[id number]  PCP:  [name], [name]  Cardiologist:  Annabell Howells  Chief Complaint  Patient presents with  . Coronary Artery Disease    Follow up visit     History of Present Illness: Derrick [name] is a 62 y.o. male who presents today for a 4 month check. Seen for Dr. [name].   He has a history of CAD s/p inferior STEMI with DES x 2 to RCA in 2004 and moderate MR with mitral valve prolapse. His other issues include tobacco abuse, bipolar disorder, OSA and HLD.  Echo from 11/2015with EF of 45 to 50%, moderateLVH, MVP with mild to moderate MR.  Seen for first time by Dr. [name] back in 2014. Cardiac status appeared stable but was short of breath - ordered a Myoview, PFTs and BNP. BNP was 18. Low risk Myoview noted. PFTs without marked abnormality.   I have seen him back over the past few years - I saw him back in January of 2018 - had had some eye surgery and was told he had a "skip" in his heart. He was asymptomatic. Had been out of his beta blocker for at least 6 months - I restarted it. Got his echo updated - EF down more - reviewed with Dr. [name] - set up for Myoview - larger scar noted. Cardiac cath recommended - ended up having PCI. Plan was to update his echo 3 months post PCI.   I then saw him back in June of 2018 - some "indigestion" reported - but had used NTG with relief. On follow up back in September he had continued to endorse some chest pain. Had been using NTG. Myoview updated - see below. Ended up getting cardiac MRI as well and EF is 47%.  Admitted to me that he could do better with his CV risk factor modification. Last seen back in October - doing quite well - no chest pain. Had stopped his Lamictal and Lexapro. He was to follow up with psyche.   Comes back today. Here alone. He feels like he is doing ok. Has only had one spell of chest pain -  that was about a month ago - took NTG x 1 with prompt relief and has not recurred. Breathing is ok. He did go to PCP due to taste/smell disorder - given RX for Tamiflu - has not filled. No fever. Feels better now. Sounds like he is back on Lexapro and Lamictal - unclear what the dosages are and he is not able to tell me what they are. Weight is stable. BP is soft. He walks about 1/2 mile a day. Smokes 2 cigs per day.   Past Medical History:  Diagnosis Date  . Anxiety   . Bipolar 1 disorder ([place])   . BPH (benign prostatic hypertrophy)   . CAD (coronary artery disease), native coronary artery    a. DES x 2 to RCA in 2004 b. 02/11/17 PCI w/DES x1 to mRCA  . Depression   . Mitral regurgitation and aortic stenosis   . MVP (mitral valve prolapse)   . OSA (obstructive sleep apnea) 03/22/2013    Past Surgical History:  Procedure Laterality Date  . CORONARY STENT INTERVENTION N/A 02/12/2017   Procedure: Coronary Stent Intervention;  Surgeon: [name], MD;  Location: [facility];  Service: Cardiovascular;  Laterality: N/A;  .  LEFT HEART CATH AND CORONARY ANGIOGRAPHY N/A 02/12/2017   Procedure: Left Heart Cath and Coronary Angiography;  Surgeon: [name], MD;  Location: [facility];  Service: Cardiovascular;  Laterality: N/A;     Medications: Current Meds  Medication Sig  . acetaminophen (TYLENOL) 500 MG tablet Take 500 mg by mouth every 6 (six) hours as needed for mild pain.   [name] ALPRAZolam (XANAX) 1 MG tablet Take 1 mg by mouth every morning.   [name] aspirin 81 MG tablet Take 81 mg by mouth daily.  [name] atorvastatin (LIPITOR) 80 MG tablet TAKE 1 TABLET  DAILY  . buPROPion (WELLBUTRIN XL) 150 MG 24 hr tablet Take 450 mg by mouth daily.   . cholecalciferol (VITAMIN D) 1000 units tablet Take 1,000 Int'l Units by mouth daily.  . clopidogrel (PLAVIX) 75 MG tablet Take 1 Tablet by mouth once daily with BREAKFAST  . divalproex (DEPAKOTE) 500 MG DR tablet Take 2,500 mg by mouth at  bedtime.  . isosorbide mononitrate (IMDUR) 30 MG 24 hr tablet Take 0.5 tablets (15 mg total) by mouth daily.  . metoprolol tartrate (LOPRESSOR) 25 MG tablet TAKE 1 1/2 TABLETS BY MOUTH TWICE DAILY  . montelukast (SINGULAIR) 10 MG tablet Take 1 Tablet by mouth once daily  . nitroGLYCERIN (NITROSTAT) 0.4 MG SL tablet Place 1 tablet (0.4 mg total) under the tongue every 5 (five) minutes as needed for chest pain.  [name] oseltamivir (TAMIFLU) 75 MG capsule Take 1 capsule (75 mg total) by mouth 2 (two) times daily.  . pantoprazole (PROTONIX) 40 MG tablet Take 1 tablet (40 mg total) by mouth daily.  . tamsulosin (FLOMAX) 0.4 MG CAPS capsule TAKE 1 CAPSULE EVERY DAY  . [DISCONTINUED] clopidogrel (PLAVIX) 75 MG tablet Take 1 Tablet by mouth once daily with BREAKFAST     Allergies: No Known Allergies  Social History: The patient  reports that he has been smoking cigarettes.  he has never used smokeless tobacco. He reports that he does not drink alcohol or use drugs.   Family History: The patient's family history includes COPD in his father; Heart disease in his brother and mother.   Review of Systems: Please see the history of present illness.   Otherwise, the review of systems is positive for none.   All other systems are reviewed and negative.   Physical Exam: VS:  BP 110/80 (BP Location: Left Arm, Patient Position: Sitting, Cuff Size: Normal)   Pulse 73   Ht 5\' 10"  (1.778 m)   Wt 188 lb (85.3 kg)   SpO2 96% Comment: at rest  BMI 26.98 kg/m  .  BMI Body mass index is 26.98 kg/m.  Wt Readings from Last 3 Encounters:  01/19/18 188 lb (85.3 kg)  01/14/18 189 lb 6.4 oz (85.9 kg)  10/05/17 185 lb 6.4 oz (84.1 kg)    General: Pleasant. Alert. Shaky today with tremor. He is in no acute distress.   HEENT: Normal.  Neck: Supple, no JVD, carotid bruits, or masses noted.  Cardiac: Regular rate and rhythm. Soft systolic murmur. No edema.  Respiratory:  Lungs are clear to auscultation bilaterally  with normal work of breathing.  GI: Soft and nontender.  MS: No deformity or atrophy. Gait and ROM intact.  Skin: Warm and dry. Color is normal.  Neuro:  Strength and sensation are intact and no gross focal deficits noted.  Psych: Alert, appropriate and with normal affect.   LABORATORY DATA:  EKG:  EKG is not ordered today.  Lab Results  Component Value Date   WBC 8.5 08/24/2017   HGB 15.4 08/24/2017   HCT 44.3 08/24/2017   PLT 184 08/24/2017   GLUCOSE 76 08/24/2017   CHOL 129 08/24/2017   TRIG 206 (H) 08/24/2017   HDL 38 (L) 08/24/2017   LDLCALC 50 08/24/2017   ALT 19 08/24/2017   AST 17 08/24/2017   NA 143 08/24/2017   K 4.4 08/24/2017   CL 102 08/24/2017   CREATININE 0.96 10/23/2017   BUN 10 08/24/2017   CO2 26 08/24/2017   TSH 3.360 07/19/2014   INR 1.0 02/09/2017     BNP (last 3 results) No results for input(s): BNP in the last 8760 hours.  ProBNP (last 3 results) No results for input(s): PROBNP in the last 8760 hours.   Other Studies Reviewed Today:  CARDIAC MRI IMPRESSION 10/2017: 1. Normal LV size with wall motion abnormalities noted above. EF 47%. Delayed enhancement images suggestive of prior infarction in the RCA territory.  2.  Normal RV size and systolic function.  3. Mitral valve prolapse with mild to moderate mitral regurgitation.  Electronically Signed   By: [name] M.D.   On: 10/23/2017 16:43  Myoview Study Highlights 08/2017   Nuclear stress EF: 37%.  Blood pressure demonstrated a normal response to exercise.  There was no ST segment deviation noted during stress.  No T wave inversion was noted during stress.  Defect 1: There is a small defect of moderate severity present in the basal inferior and mid inferior location.  Findings consistent with prior myocardial infarction. No ischemia.  The left ventricular ejection fraction is moderately decreased (30-44%).    Notes recorded by Burtis Junes, NP on  09/01/2017 at 7:51 AM EDT Ok to report. Dr. [name] has reviewed his Myoview - EF while low - same as prior study - would like to see back in about a month to recheck him. May consider cardiac MRI to get a better sense of his pumping function. For now, stay on current regimen as outlined at his visit.   Echo Study Conclusions 05/2017  - Left ventricle: The cavity size was normal. Wall thickness was normal. The estimated ejection fraction was 50%. Diffuse hypokinesis. Doppler parameters are consistent with abnormal left ventricular relaxation (grade 1 diastolic dysfunction). - Aortic valve: There was no stenosis. - Mitral valve: Mildly calcified annulus. Bileaflet mitral valve prolapse. There was mild to moderate late systolic regurgitation. - Right ventricle: The cavity size was normal. Systolic function was normal. - Pulmonary arteries: No complete TR doppler jet so unable to estimate PA systolic pressure. - Inferior vena cava: The vessel was normal in size. The respirophasic diameter changes were in the normal range (>= 50%), consistent with normal central venous pressure.  Impressions:  - Normal LV size with EF 50%, mild diffuse hypokinesis. Normal RV size and systolic function. Bileaflet mitral valve prolapse with mild to moderate late systolic mitral regurgitation.   Coronary Stent Intervention 02/2017  Conclusion     Mid RCA lesion, 90 %stenosed.  A STENT RESOLUTE ONYX 4.0X12 drug eluting stent was successfully placed.  Prox RCA to Mid RCA lesion, 90 %stenosed.  Post intervention, there is a 0% residual stenosis.  Successful stenting of the mid RCA with DES for in stent restenosis.  Plan: DAPT for one year with ASA and Plavix. May be suitable for same day discharge.      Assessment / Plan:  1. CAD - s/p PCI to the RCA March  of 2018 - remains on DAPT until next month - he did not have associated chest pain with the work up that led  to PCI - he has had some intermittent episodes of chest pain - atypical but uses NTG with relief - only one episode over the past 5 months. Will stop the Plavix as of March 1st. Stay on aspirin. Continue with CV risk factor modification. Remains on nitrate therapy.   2. LV dysfunction - EF is 47% by cardiac MRI - on beta blocker. BP soft - would hold on adding ACE/ARB.   3. Tobacco abuse- has smoked 2 cigs a day for many years.   4. HTN - BP is actually soft - no changes made today  5. HLD - lab today  6. Mitral regurgitation - mild to moderate MR on last echo - plan to repeat on return. Not much murmur today on exam.   7. Psyche disorder - unclear what he is taking at this time. He is followed by [facility].     Current medicines are reviewed with the patient today.  The patient does not have concerns regarding medicines other than what has been noted above.  The following changes have been made:  See above.  Labs/ tests ordered today include:   No orders of the defined types were placed in this encounter.    Disposition:   FU with me in 4 months.   Patient is agreeable to this plan and will call if any problems develop in the interim.   SignedTruitt Merle, NP  01/19/2018 8:48 AM  [facility] [address] [place], [place]  [address] Phone: [phone] Fax: [phone]

## 2018-01-26 ENCOUNTER — Telehealth: Payer: Self-pay | Admitting: Nurse Practitioner

## 2018-01-26 NOTE — Telephone Encounter (Signed)
S/w pt aware of lab results.  Stated this was released to Derrick [name], pt stated does not use mychart but needs to. Will reactivate mychart when pt has next appt.

## 2018-01-26 NOTE — Telephone Encounter (Signed)
Derrick [name] is calling about his results of his lab work .  Please call .[name] Thanks

## 2018-02-10 DIAGNOSIS — F3175 Bipolar disorder, in partial remission, most recent episode depressed: Secondary | ICD-10-CM | POA: Diagnosis not present

## 2018-02-12 DIAGNOSIS — F3175 Bipolar disorder, in partial remission, most recent episode depressed: Secondary | ICD-10-CM | POA: Diagnosis not present

## 2018-03-12 DIAGNOSIS — F3175 Bipolar disorder, in partial remission, most recent episode depressed: Secondary | ICD-10-CM | POA: Diagnosis not present

## 2018-03-22 ENCOUNTER — Other Ambulatory Visit: Payer: Self-pay | Admitting: Cardiology

## 2018-04-15 DIAGNOSIS — F3175 Bipolar disorder, in partial remission, most recent episode depressed: Secondary | ICD-10-CM | POA: Diagnosis not present

## 2018-04-21 ENCOUNTER — Other Ambulatory Visit: Payer: Self-pay | Admitting: *Deleted

## 2018-04-21 MED ORDER — MONTELUKAST SODIUM 10 MG PO TABS: 10.0000 mg | ORAL_TABLET | Freq: Every day | ORAL | 1 refills | Status: DC

## 2018-05-05 DIAGNOSIS — Z79899 Other long term (current) drug therapy: Secondary | ICD-10-CM | POA: Diagnosis not present

## 2018-05-05 DIAGNOSIS — F3175 Bipolar disorder, in partial remission, most recent episode depressed: Secondary | ICD-10-CM | POA: Diagnosis not present

## 2018-05-18 ENCOUNTER — Ambulatory Visit: Payer: Medicare HMO | Admitting: Nurse Practitioner

## 2018-05-25 DIAGNOSIS — F3175 Bipolar disorder, in partial remission, most recent episode depressed: Secondary | ICD-10-CM | POA: Diagnosis not present

## 2018-06-03 LAB — FECAL OCCULT BLOOD, GUAIAC: Fecal Occult Blood: POSITIVE

## 2018-06-15 ENCOUNTER — Ambulatory Visit (INDEPENDENT_AMBULATORY_CARE_PROVIDER_SITE_OTHER): Payer: Medicare HMO | Admitting: Physician Assistant

## 2018-06-15 ENCOUNTER — Encounter: Payer: Self-pay | Admitting: Physician Assistant

## 2018-06-15 VITALS — BP 114/76 | HR 74 | Temp 97.7°F | Ht 70.0 in | Wt 189.0 lb

## 2018-06-15 DIAGNOSIS — Z1211 Encounter for screening for malignant neoplasm of colon: Secondary | ICD-10-CM

## 2018-06-15 MED ORDER — CEPHALEXIN 500 MG PO CAPS: 500.0000 mg | ORAL_CAPSULE | Freq: Three times a day (TID) | ORAL | 0 refills | Status: DC

## 2018-06-15 MED ORDER — MUPIROCIN 2 % EX OINT: 1.0000 "application " | TOPICAL_OINTMENT | Freq: Two times a day (BID) | CUTANEOUS | 0 refills | Status: DC

## 2018-06-16 ENCOUNTER — Encounter: Payer: Self-pay | Admitting: Gastroenterology

## 2018-06-16 ENCOUNTER — Telehealth: Payer: Self-pay | Admitting: Physician Assistant

## 2018-06-16 NOTE — Telephone Encounter (Signed)
Patient aware, script is ready. It was filled by [facility].

## 2018-06-20 NOTE — Progress Notes (Signed)
BP 114/76   Pulse 74   Temp 97.7 F (36.5 C) (Oral)   Ht 5\' 10"  (1.778 m)   Wt 189 lb (85.7 kg)   BMI 27.12 kg/m    Subjective:    Patient ID: Derrick [name], male    DOB: 16-Jan-1956, 62 y.o.   MRN: [id number]  HPI: Derrick [name] is a 62 y.o. male presenting on 06/15/2018 for Cologuard positive  This patient had a home nurse from his insurance have him perform a fit card.  It was positive.  So Cologuard was not performed.  He does have a very strong history of colon cancer in his family, and his daughter had ovarian cancer and died of it.  He was told in the past that he should always get his exams performed because of genetic relationship to the cancers that were in his family.  He is not describing any problems at this time and denies any blood being seen.  Past Medical History:  Diagnosis Date  . Anxiety   . Bipolar 1 disorder ([place])   . BPH (benign prostatic hypertrophy)   . CAD (coronary artery disease), native coronary artery    a. DES x 2 to RCA in 2004 b. 02/11/17 PCI w/DES x1 to mRCA  . Depression   . Mitral regurgitation and aortic stenosis   . MVP (mitral valve prolapse)   . OSA (obstructive sleep apnea) 03/22/2013   Relevant past medical, surgical, family and social history reviewed and updated as indicated. Interim medical history since our last visit reviewed. Allergies and medications reviewed and updated. DATA REVIEWED: CHART IN EPIC  Family History reviewed for pertinent findings.  Review of Systems  Constitutional: Negative.  Negative for appetite change and fatigue.  Eyes: Negative for pain and visual disturbance.  Respiratory: Negative.  Negative for cough, chest tightness, shortness of breath and wheezing.   Cardiovascular: Negative.  Negative for chest pain, palpitations and leg swelling.  Gastrointestinal: Negative.  Negative for abdominal pain, diarrhea, nausea and vomiting.  Genitourinary: Negative.   Skin: Negative.  Negative for color change and  rash.  Neurological: Negative.  Negative for weakness, numbness and headaches.  Psychiatric/Behavioral: Negative.     Allergies as of 06/15/2018   No Known Allergies     Medication List        Accurate as of 06/15/18 11:59 PM. Always use your most recent med list.          acetaminophen 500 MG tablet Commonly known as:  TYLENOL Take 500 mg by mouth every 6 (six) hours as needed for mild pain.   ALPRAZolam 1 MG tablet Commonly known as:  XANAX Take 1 mg by mouth every morning.   aspirin 81 MG tablet Take 81 mg by mouth daily.   atorvastatin 80 MG tablet Commonly known as:  LIPITOR TAKE 1 TABLET EVERY DAY   buPROPion 150 MG 24 hr tablet Commonly known as:  WELLBUTRIN XL Take 450 mg by mouth daily.   cephALEXin 500 MG capsule Commonly known as:  KEFLEX Take 1 capsule (500 mg total) by mouth 3 (three) times daily.   cholecalciferol 1000 units tablet Commonly known as:  VITAMIN D Take 1,000 Int'l Units by mouth daily.   clopidogrel 75 MG tablet Commonly known as:  PLAVIX Take 1 Tablet by mouth once daily with BREAKFAST   divalproex 500 MG DR tablet Commonly known as:  DEPAKOTE Take 1,000 mg by mouth at bedtime.   isosorbide mononitrate 30  MG 24 hr tablet Commonly known as:  IMDUR Take 0.5 tablets (15 mg total) by mouth daily.   metoprolol tartrate 25 MG tablet Commonly known as:  LOPRESSOR TAKE 1 1/2 TABLETS BY MOUTH TWICE DAILY   montelukast 10 MG tablet Commonly known as:  SINGULAIR Take 1 tablet (10 mg total) by mouth daily.   mupirocin ointment 2 % Commonly known as:  BACTROBAN Apply 1 application topically 2 (two) times daily.   nitroGLYCERIN 0.4 MG SL tablet Commonly known as:  NITROSTAT Place 1 tablet (0.4 mg total) under the tongue every 5 (five) minutes as needed for chest pain.   oseltamivir 75 MG capsule Commonly known as:  TAMIFLU Take 1 capsule (75 mg total) by mouth 2 (two) times daily.   pantoprazole 40 MG tablet Commonly known as:   PROTONIX Take 1 tablet (40 mg total) by mouth daily.   tamsulosin 0.4 MG Caps capsule Commonly known as:  FLOMAX TAKE 1 CAPSULE EVERY DAY          Objective:    BP 114/76   Pulse 74   Temp 97.7 F (36.5 C) (Oral)   Ht 5\' 10"  (1.778 m)   Wt 189 lb (85.7 kg)   BMI 27.12 kg/m   No Known Allergies  Wt Readings from Last 3 Encounters:  06/15/18 189 lb (85.7 kg)  01/19/18 188 lb (85.3 kg)  01/14/18 189 lb 6.4 oz (85.9 kg)    Physical Exam  Constitutional: He appears well-developed and well-nourished. No distress.  HENT:  Head: Normocephalic and atraumatic.  Eyes: Pupils are equal, round, and reactive to light. Conjunctivae and EOM are normal.  Cardiovascular: Normal rate, regular rhythm and normal heart sounds.  Pulmonary/Chest: Effort normal and breath sounds normal. No respiratory distress.  Skin: Skin is warm and dry.  Psychiatric: He has a normal mood and affect. His behavior is normal.  Nursing note and vitals reviewed.   Results for orders placed or performed in visit on 63/01/60  Basic metabolic panel  Result Value Ref Range   Glucose 97 65 - 99 mg/dL   BUN 13 8 - 27 mg/dL   Creatinine, Ser 0.99 0.76 - 1.27 mg/dL   GFR calc non Af Amer 82 >59 mL/min/1.73   GFR calc Af Amer 95 >59 mL/min/1.73   BUN/Creatinine Ratio 13 10 - 24   Sodium 140 134 - 144 mmol/L   Potassium 4.4 3.5 - 5.2 mmol/L   Chloride 103 96 - 106 mmol/L   CO2 24 20 - 29 mmol/L   Calcium 9.4 8.6 - 10.2 mg/dL  CBC  Result Value Ref Range   WBC 7.0 3.4 - 10.8 x10E3/uL   RBC 4.36 4.14 - 5.80 x10E6/uL   Hemoglobin 14.4 13.0 - 17.7 g/dL   Hematocrit 40.9 37.5 - 51.0 %   MCV 94 79 - 97 fL   MCH 33.0 26.6 - 33.0 pg   MCHC 35.2 31.5 - 35.7 g/dL   RDW 13.6 12.3 - 15.4 %   Platelets 216 150 - 379 x10E3/uL  Hepatic function panel  Result Value Ref Range   Total Protein 6.6 6.0 - 8.5 g/dL   Albumin 4.3 3.6 - 4.8 g/dL   Bilirubin Total 0.3 0.0 - 1.2 mg/dL   Bilirubin, Direct 0.10 0.00 - 0.40  mg/dL   Alkaline Phosphatase 64 39 - 117 IU/L   AST 26 0 - 40 IU/L   ALT 29 0 - 44 IU/L  Lipid panel  Result Value Ref Range  Cholesterol, Total 137 100 - 199 mg/dL   Triglycerides 224 (H) 0 - 149 mg/dL   HDL 33 (L) >39 mg/dL   VLDL Cholesterol Cal 45 (H) 5 - 40 mg/dL   LDL Calculated 59 0 - 99 mg/dL   Chol/HDL Ratio 4.2 0.0 - 5.0 ratio      Assessment & Plan:   1. Screening for colon cancer - Ambulatory referral to Gastroenterology   Continue all other maintenance medications as listed above.  Follow up plan: No follow-ups on file.  Educational handout given for [place] [name] [place] [address]  [place], [place] [address] [phone]   06/20/2018, 8:37 PM

## 2018-06-29 ENCOUNTER — Encounter: Payer: Self-pay | Admitting: *Deleted

## 2018-06-29 DIAGNOSIS — F3175 Bipolar disorder, in partial remission, most recent episode depressed: Secondary | ICD-10-CM | POA: Diagnosis not present

## 2018-07-05 ENCOUNTER — Telehealth: Payer: Self-pay

## 2018-07-05 NOTE — Telephone Encounter (Signed)
Spoken with pts wife,per wife pt is no longer taking Plavix. Will go into further detail with pt on stop date for plavix at  [facility]. Wife did not know when his plavix had been continued.

## 2018-07-07 ENCOUNTER — Ambulatory Visit (AMBULATORY_SURGERY_CENTER): Payer: Self-pay

## 2018-07-07 VITALS — Ht 70.0 in | Wt 188.4 lb

## 2018-07-07 DIAGNOSIS — R195 Other fecal abnormalities: Secondary | ICD-10-CM

## 2018-07-07 MED ORDER — NA SULFATE-K SULFATE-MG SULF 17.5-3.13-1.6 GM/177ML PO SOLN: 1.0000 | Freq: Once | ORAL | 0 refills | Status: AC

## 2018-07-07 NOTE — Progress Notes (Signed)
Denies allergies to eggs or soy products. Denies complication of anesthesia or sedation. Denies use of weight loss medication. Denies use of O2.   Emmi instructions declined.  

## 2018-07-12 ENCOUNTER — Ambulatory Visit: Payer: Medicare HMO | Admitting: Nurse Practitioner

## 2018-07-16 ENCOUNTER — Encounter: Payer: Self-pay | Admitting: Gastroenterology

## 2018-07-23 DIAGNOSIS — H5210 Myopia, unspecified eye: Secondary | ICD-10-CM | POA: Diagnosis not present

## 2018-07-23 DIAGNOSIS — I10 Essential (primary) hypertension: Secondary | ICD-10-CM | POA: Diagnosis not present

## 2018-07-28 ENCOUNTER — Encounter: Payer: Self-pay | Admitting: Gastroenterology

## 2018-07-28 ENCOUNTER — Other Ambulatory Visit: Payer: Self-pay

## 2018-07-28 ENCOUNTER — Ambulatory Visit (AMBULATORY_SURGERY_CENTER): Payer: Medicare HMO | Admitting: Gastroenterology

## 2018-07-28 VITALS — BP 106/70 | HR 64 | Temp 97.5°F | Resp 16 | Ht 70.0 in | Wt 188.0 lb

## 2018-07-28 DIAGNOSIS — Z1211 Encounter for screening for malignant neoplasm of colon: Secondary | ICD-10-CM | POA: Diagnosis not present

## 2018-07-28 DIAGNOSIS — D124 Benign neoplasm of descending colon: Secondary | ICD-10-CM | POA: Diagnosis not present

## 2018-07-28 DIAGNOSIS — D125 Benign neoplasm of sigmoid colon: Secondary | ICD-10-CM | POA: Diagnosis not present

## 2018-07-28 DIAGNOSIS — Z800 Family history of malignant neoplasm of digestive organs: Secondary | ICD-10-CM | POA: Diagnosis not present

## 2018-07-28 DIAGNOSIS — R195 Other fecal abnormalities: Secondary | ICD-10-CM

## 2018-07-28 MED ORDER — SODIUM CHLORIDE 0.9 % IV SOLN: 500.0000 mL | Freq: Once | INTRAVENOUS | Status: DC

## 2018-07-28 NOTE — Progress Notes (Signed)
Called to room to assist during endoscopic procedure.  Patient ID and intended procedure confirmed with present staff. Received instructions for my participation in the procedure from the performing physician.  

## 2018-07-28 NOTE — Patient Instructions (Signed)
Thank you for allowing [place] to care for you today!  Await pathology results by mail, approx. 2 weeks.  Resume previous diet and medications.  Handout given for polyps.  Next colonoscopy to be determined after pathology results are final.       YOU HAD AN ENDOSCOPIC PROCEDURE TODAY AT [facility]:   Refer to the procedure report that was given to you for any specific questions about what was found during the examination.  If the procedure report does not answer your questions, please call your gastroenterologist to clarify.  If you requested that your care partner not be given the details of your procedure findings, then the procedure report has been included in a sealed envelope for you to review at your convenience later.  YOU SHOULD EXPECT: Some feelings of bloating in the abdomen. Passage of more gas than usual.  Walking can help get rid of the air that was put into your GI tract during the procedure and reduce the bloating. If you had a lower endoscopy (such as a colonoscopy or flexible sigmoidoscopy) you may notice spotting of blood in your stool or on the toilet paper. If you underwent a bowel prep for your procedure, you may not have a normal bowel movement for a few days.  Please Note:  You might notice some irritation and congestion in your nose or some drainage.  This is from the oxygen used during your procedure.  There is no need for concern and it should clear up in a day or so.  SYMPTOMS TO REPORT IMMEDIATELY:   Following lower endoscopy (colonoscopy or flexible sigmoidoscopy):  Excessive amounts of blood in the stool  Significant tenderness or worsening of abdominal pains  Swelling of the abdomen that is new, acute  Fever of 100F or higher    For urgent or emergent issues, a gastroenterologist can be reached at any hour by calling [phone].   DIET:  We do recommend a small meal at first, but then you may proceed to your regular diet.  Drink  plenty of fluids but you should avoid alcoholic beverages for 24 hours.  ACTIVITY:  You should plan to take it easy for the rest of today and you should NOT DRIVE or use heavy machinery until tomorrow (because of the sedation medicines used during the test).    FOLLOW UP: Our staff will call the number listed on your records the next business day following your procedure to check on you and address any questions or concerns that you may have regarding the information given to you following your procedure. If we do not reach you, we will leave a message.  However, if you are feeling well and you are not experiencing any problems, there is no need to return our call.  We will assume that you have returned to your regular daily activities without incident.  If any biopsies were taken you will be contacted by phone or by letter within the next 1-3 weeks.  Please call us at [phone] if you have not heard about the biopsies in 3 weeks.    SIGNATURES/CONFIDENTIALITY: You and/or your care partner have signed paperwork which will be entered into your electronic medical record.  These signatures attest to the fact that that the information above on your After Visit Summary has been reviewed and is understood.  Full responsibility of the confidentiality of this discharge information lies with you and/or your care-partner.

## 2018-07-28 NOTE — Progress Notes (Signed)
Pt's states no medical or surgical changes since previsit or office visit. 

## 2018-07-28 NOTE — Op Note (Addendum)
[facility] Patient Name: Terrie Grajales Procedure Date: 07/28/2018 7:52 AM MRN: [id number] Endoscopist: Jackquline Denmark , MD Age: 62 Referring MD:  Date of Birth: 12-30-1955 Gender: Male Account #: [id number] Procedure:                Colonoscopy Indications:              Heme positive stool. Family history of colon cancer                            in a first-degree relative Medicines:                Monitored Anesthesia Care Procedure:                Pre-Anesthesia Assessment:                           - Prior to the procedure, a History and Physical                            was performed, and patient medications and                            allergies were reviewed. The patient's tolerance of                            previous anesthesia was also reviewed. The risks                            and benefits of the procedure and the sedation                            options and risks were discussed with the patient.                            All questions were answered, and informed consent                            was obtained. Prior Anticoagulants: The patient has                            taken aspirin, last dose was day of procedure. ASA                            Grade Assessment: II - A patient with mild systemic                            disease. After reviewing the risks and benefits,                            the patient was deemed in satisfactory condition to                            undergo the procedure.  After obtaining informed consent, the colonoscope                            was passed under direct vision. Throughout the                            procedure, the patient's blood pressure, pulse, and                            oxygen saturations were monitored continuously. The                            Colonoscope was introduced through the anus and                            advanced to the 2 cm into the ileum. The                        colonoscopy was performed without difficulty. The                            patient tolerated the procedure well. The quality                            of the bowel preparation was excellent. Scope In: 8:06:53 AM Scope Out: 8:27:51 AM Scope Withdrawal Time: 0 hours 15 minutes 13 seconds  Total Procedure Duration: 0 hours 20 minutes 58 seconds  Findings:                 Five sessile polyps were found in the descending                            colon. The polyps were 6 to 8 mm in size. These                            polyps were removed with a cold snare. Resection                            and retrieval were complete. Estimated blood loss:                            none.                           Three sessile polyps were found in the proximal                            sigmoid colon. The polyps were 6 to 8 mm in size.                            These polyps were removed with a cold snare.                            Resection and retrieval were complete.  Estimated                            blood loss was minimal.                           A 4 mm polyp was found in the sigmoid colon. The                            polyp was sessile. The polyp was removed with a                            cold biopsy forceps. Resection and retrieval were                            complete.                           A 10 mm polyp was found in the sigmoid colon. The                            polyp was semi-pedunculated. The polyp was removed                            with a hot snare. Resection and retrieval were                            complete. Estimated blood loss: none.                           Minimal internal hemorrhoids.                           The exam was otherwise without abnormality. Complications:            No immediate complications. Estimated Blood Loss:     Estimated blood loss: none. Impression:               - Colonic polyps status post polypectomy.                            - The examination was otherwise normal. Recommendation:           - Patient has a contact number available for                            emergencies. The signs and symptoms of potential                            delayed complications were discussed with the                            patient. Return to normal activities tomorrow.                            Written discharge instructions were provided to the  patient.                           - Resume previous diet.                           - Continue present medications.                           - Await pathology results.                           - Repeat colonoscopy for surveillance based on                            pathology results.                           - Return to GI clinic PRN. Jackquline Denmark, MD 07/28/2018 8:36:47 AM This report has been signed electronically.

## 2018-07-28 NOTE — Progress Notes (Signed)
Late entry for [phone]  Alert and oriented x 3, pleased with MAC, report to RN

## 2018-07-29 ENCOUNTER — Telehealth: Payer: Self-pay

## 2018-07-29 ENCOUNTER — Other Ambulatory Visit: Payer: Self-pay | Admitting: *Deleted

## 2018-07-29 ENCOUNTER — Telehealth: Payer: Self-pay | Admitting: Nurse Practitioner

## 2018-07-29 MED ORDER — ATORVASTATIN CALCIUM 80 MG PO TABS: ORAL_TABLET | ORAL | 1 refills | Status: DC

## 2018-07-29 NOTE — Telephone Encounter (Signed)
  Follow up Call-  Call back number 07/28/2018  Post procedure Call Back phone  # [phone]  Permission to leave phone message Yes  Some recent data might be hidden     Patient questions:  Do you have a fever, pain , or abdominal swelling? No. Pain Score  0 *  Have you tolerated food without any problems? Yes.    Have you been able to return to your normal activities? Yes.    Do you have any questions about your discharge instructions: Diet   No. Medications  No. Follow up visit  No.  Do you have questions or concerns about your Care? No.  Actions: * If pain score is 4 or above: No action needed, pain <4.

## 2018-07-29 NOTE — Telephone Encounter (Signed)
Rx sent in as requested. 

## 2018-07-29 NOTE — Telephone Encounter (Signed)
New Message         *STAT* If patient is at the pharmacy, call can be transferred to refill team.   1. Which medications need to be refilled? (please list name of each medication and dose if known) Lipitor 80  2. Which pharmacy/location (including street and city if local pharmacy) is medication to be sent to? [facility] order  3. Do they need a 30 day or 90 day supply? [place]

## 2018-08-05 ENCOUNTER — Encounter: Payer: Self-pay | Admitting: Gastroenterology

## 2018-08-25 DIAGNOSIS — F3175 Bipolar disorder, in partial remission, most recent episode depressed: Secondary | ICD-10-CM | POA: Diagnosis not present

## 2018-08-30 ENCOUNTER — Ambulatory Visit: Payer: Medicare HMO | Admitting: Nurse Practitioner

## 2018-08-30 ENCOUNTER — Encounter: Payer: Self-pay | Admitting: Nurse Practitioner

## 2018-08-30 VITALS — BP 104/80 | HR 82 | Ht 71.0 in | Wt 185.8 lb

## 2018-08-30 DIAGNOSIS — I259 Chronic ischemic heart disease, unspecified: Secondary | ICD-10-CM | POA: Diagnosis not present

## 2018-08-30 DIAGNOSIS — E7800 Pure hypercholesterolemia, unspecified: Secondary | ICD-10-CM

## 2018-08-30 DIAGNOSIS — I5022 Chronic systolic (congestive) heart failure: Secondary | ICD-10-CM | POA: Diagnosis not present

## 2018-08-30 MED ORDER — ATORVASTATIN CALCIUM 80 MG PO TABS: ORAL_TABLET | ORAL | 3 refills | Status: DC

## 2018-08-30 MED ORDER — NITROGLYCERIN 0.4 MG SL SUBL: 0.4000 mg | SUBLINGUAL_TABLET | SUBLINGUAL | 3 refills | Status: DC | PRN

## 2018-08-30 MED ORDER — PANTOPRAZOLE SODIUM 40 MG PO TBEC: 40.0000 mg | DELAYED_RELEASE_TABLET | Freq: Every day | ORAL | 3 refills | Status: DC

## 2018-08-30 MED ORDER — METOPROLOL TARTRATE 25 MG PO TABS: ORAL_TABLET | ORAL | 3 refills | Status: DC

## 2018-08-30 MED ORDER — ISOSORBIDE MONONITRATE ER 30 MG PO TB24
15.0000 mg | ORAL_TABLET | Freq: Every day | ORAL | 3 refills | Status: DC
Start: 2018-08-30 — End: 2019-06-08

## 2018-08-30 NOTE — Patient Instructions (Addendum)
We will be checking the following labs today - BMET, CBC, HPF and lipids   Medication Instructions:    Continue with your current medicines.   I have sent in your refills today.     Testing/Procedures To Be Arranged:  N/A  Follow-Up:   See me in 6 months    Other Special Instructions:   N/A    If you need a refill on your cardiac medications before your next appointment, please call your pharmacy.   Call the [place] office at [phone] if you have any questions, problems or concerns.

## 2018-08-30 NOTE — Progress Notes (Signed)
CARDIOLOGY OFFICE NOTE  Date:  08/30/2018    [name] Date of Birth: 1955-12-29 Medical Record #[id number]  PCP:  [name], [name]  Cardiologist:  Servando Snare     Chief Complaint  Patient presents with  . Coronary Artery Disease    Follow up visit     History of Present Illness: JAMIE BELGER is a 62 y.o. male who presents today for a 7 month check. Seen for Dr. [name].   He has a history of CAD s/p inferior STEMI with DES x 2 to RCA in 2004 and moderate MR with mitral valve prolapse. His other issues include tobacco abuse, bipolar disorder, OSA and HLD. He smokes 2 cigs per day and has done so for many years.   Echo from 11/2015with EF of 45 to 50%, moderateLVH, MVP with mild to moderate MR.  Seen for first time by Dr. [name] back in 2014. Cardiac status appeared stable but was short of breath - ordered a Myoview, PFTs and BNP. BNP was 18. Low risk Myoview noted. PFTs without marked abnormality.   I have seen him back over the past few years - I saw him back in January of 2018 - had had some eye surgery and was told he had a "skip" in his heart. He was asymptomatic. Had been out of his beta blocker for at least 6 months - I restarted it. Got his echo updated - EF down more - reviewed with Dr. [name] - set up for Myoview - larger scar noted. Cardiac cath recommended - ended up having PCI. Plan was to update his echo 3 months post PCI.   I then saw him back in June of 2018 - some "indigestion" reported - but had used NTG with relief.On follow up back in September he had continued to endorse some chest pain. Had been using NTG. Myoview updated- see below. Ended up getting cardiac MRI as well and EF is 47%.  Admits that he could do better with his CV risk factor modification.When seen back in October of 2018 - doing quite well - no chest pain. Had stopped his Lamictal and Lexapro. He was to follow up with psyche. Last visit with me was back in February - he was doing  well. Was back on his psyche meds. Still smoking about 2 cigs per day.   Comes back today. Here alone.He has had colonoscopy a month - had 10 polyps removed - repeat study in 3 years. He had had a + stool test at home. Now doing ok. Rare use of NTG. He feels like he is doing well. Had a spell of chest pain a few months ago - nothing since. Did not have his NTG with him that day. Overall, he feels like he is doing well and has no real concerns. He needs refills.   Past Medical History:  Diagnosis Date  . Anxiety   . Bipolar 1 disorder ([place])   . BPH (benign prostatic hypertrophy)   . CAD (coronary artery disease), native coronary artery    a. DES x 2 to RCA in 2004 b. 02/11/17 PCI w/DES x1 to mRCA  . Depression   . Hyperlipidemia   . Mitral regurgitation and aortic stenosis   . MVP (mitral valve prolapse)   . Myocardial infarction ([place])   . OSA (obstructive sleep apnea) 03/22/2013  . Sleep apnea     Past Surgical History:  Procedure Laterality Date  . CORONARY STENT INTERVENTION N/A 02/12/2017  Procedure: Coronary Stent Intervention;  Surgeon: [name], MD;  Location: [facility];  Service: Cardiovascular;  Laterality: N/A;  . LEFT HEART CATH AND CORONARY ANGIOGRAPHY N/A 02/12/2017   Procedure: Left Heart Cath and Coronary Angiography;  Surgeon: [name], MD;  Location: [facility];  Service: Cardiovascular;  Laterality: N/A;     Medications: Current Meds  Medication Sig  . acetaminophen (TYLENOL) 500 MG tablet Take 500 mg by mouth every 6 (six) hours as needed for mild pain.   [name] ALPRAZolam (XANAX) 1 MG tablet Take 1 mg by mouth every morning.   [name] aspirin 81 MG tablet Take 81 mg by mouth daily.  [name] atorvastatin (LIPITOR) 80 MG tablet TAKE 1 TABLET BY MOUTH DAILY  . buPROPion (WELLBUTRIN XL) 150 MG 24 hr tablet Take 450 mg by mouth daily.   . clonazePAM (KLONOPIN) 1 MG tablet Take 1 mg by mouth as needed for anxiety.  . divalproex (DEPAKOTE) 500 MG DR tablet  Take 1,000 mg by mouth at bedtime.   [name] escitalopram (LEXAPRO) 20 MG tablet Take 20 mg by mouth daily.  . isosorbide mononitrate (IMDUR) 30 MG 24 hr tablet Take 0.5 tablets (15 mg total) by mouth daily.  . metoprolol tartrate (LOPRESSOR) 25 MG tablet TAKE 1 1/2 TABLETS BY MOUTH TWICE DAILY  . montelukast (SINGULAIR) 10 MG tablet Take 1 tablet (10 mg total) by mouth daily.  . mupirocin ointment (BACTROBAN) 2 % Apply 1 application topically 2 (two) times daily.  . nitroGLYCERIN (NITROSTAT) 0.4 MG SL tablet Place 1 tablet (0.4 mg total) under the tongue every 5 (five) minutes as needed for chest pain.  . pantoprazole (PROTONIX) 40 MG tablet Take 1 tablet (40 mg total) by mouth daily.  . [DISCONTINUED] atorvastatin (LIPITOR) 80 MG tablet TAKE 1 TABLET BY MOUTH DAILY  . [DISCONTINUED] isosorbide mononitrate (IMDUR) 30 MG 24 hr tablet Take 0.5 tablets (15 mg total) by mouth daily.  . [DISCONTINUED] metoprolol tartrate (LOPRESSOR) 25 MG tablet TAKE 1 1/2 TABLETS BY MOUTH TWICE DAILY  . [DISCONTINUED] nitroGLYCERIN (NITROSTAT) 0.4 MG SL tablet Place 1 tablet (0.4 mg total) under the tongue every 5 (five) minutes as needed for chest pain.  . [DISCONTINUED] pantoprazole (PROTONIX) 40 MG tablet Take 1 tablet (40 mg total) by mouth daily.   Current Facility-Administered Medications for the 08/30/18 encounter (Office Visit) with Burtis Junes, NP  Medication  . 0.9 %  sodium chloride infusion     Allergies: No Known Allergies  Social History: The patient  reports that he quit smoking about 15 years ago. His smoking use included cigarettes. He has never used smokeless tobacco. He reports that he does not drink alcohol or use drugs.   Family History: The patient's family history includes COPD in his father; Colon cancer in his sister; Heart disease in his brother and mother.   Review of Systems: Please see the history of present illness.   Otherwise, the review of systems is positive for none.   All  other systems are reviewed and negative.   Physical Exam: VS:  BP 104/80 (BP Location: Left Arm, Patient Position: Sitting, Cuff Size: Normal)   Pulse 82   Ht 5\' 11"  (1.803 m)   Wt 185 lb 12.8 oz (84.3 kg)   BMI 25.91 kg/m  .  BMI Body mass index is 25.91 kg/m.  Wt Readings from Last 3 Encounters:  08/30/18 185 lb 12.8 oz (84.3 kg)  07/28/18 188 lb (85.3 kg)  07/07/18 188 lb 6.4 oz (85.5 kg)    General: Pleasant. Well developed, well nourished and in no acute distress.   HEENT: Normal.  Neck: Supple, no JVD, carotid bruits, or masses noted.  Cardiac: Regular rate and rhythm. No murmurs, rubs, or gallops. No edema.  Respiratory:  Lungs are clear to auscultation bilaterally with normal work of breathing.  GI: Soft and nontender.  MS: No deformity or atrophy. Gait and ROM intact.  Skin: Warm and dry. Color is normal.  Neuro:  Strength and sensation are intact and no gross focal deficits noted.  Psych: Alert, appropriate and with normal affect.   LABORATORY DATA:  EKG:  EKG is ordered today. This demonstrates NSR with PAC and PVC.  Lab Results  Component Value Date   WBC 7.0 01/19/2018   HGB 14.4 01/19/2018   HCT 40.9 01/19/2018   PLT 216 01/19/2018   GLUCOSE 97 01/19/2018   CHOL 137 01/19/2018   TRIG 224 (H) 01/19/2018   HDL 33 (L) 01/19/2018   LDLCALC 59 01/19/2018   ALT 29 01/19/2018   AST 26 01/19/2018   NA 140 01/19/2018   K 4.4 01/19/2018   CL 103 01/19/2018   CREATININE 0.99 01/19/2018   BUN 13 01/19/2018   CO2 24 01/19/2018   TSH 3.360 07/19/2014   INR 1.0 02/09/2017        BNP (last 3 results) No results for input(s): BNP in the last 8760 hours.  ProBNP (last 3 results) No results for input(s): PROBNP in the last 8760 hours.   Other Studies Reviewed Today:  CARDIAC MRI IMPRESSION 10/2017: 1. Normal LV size with wall motion abnormalities noted above. EF 47%. Delayed enhancement images suggestive of prior infarction in the RCA  territory.  2. Normal RV size and systolic function.  3. Mitral valve prolapse with mild to moderate mitral regurgitation.  Electronically Signed By: [name] M.D. On: 10/23/2017 16:43  MyoviewStudy Highlights9/2018   Nuclear stress EF: 37%.  Blood pressure demonstrated a normal response to exercise.  There was no ST segment deviation noted during stress.  No T wave inversion was noted during stress.  Defect 1: There is a small defect of moderate severity present in the basal inferior and mid inferior location.  Findings consistent with prior myocardial infarction. No ischemia.  The left ventricular ejection fraction is moderately decreased (30-44%).    Notes recorded by Burtis Junes, NP on 09/01/2017 at 7:51 AM EDT Ok to report. Dr. [name] has reviewed his Myoview - EF while low - same as prior study - would like to see back in about a month to recheck him. May consider cardiac MRI to get a better sense of his pumping function. For now, stay on current regimen as outlined at his visit.   Echo Study Conclusions 05/2017  - Left ventricle: The cavity size was normal. Wall thickness was normal. The estimated ejection fraction was 50%. Diffuse hypokinesis. Doppler parameters are consistent with abnormal left ventricular relaxation (grade 1 diastolic dysfunction). - Aortic valve: There was no stenosis. - Mitral valve: Mildly calcified annulus. Bileaflet mitral valve prolapse. There was mild to moderate late systolic regurgitation. - Right ventricle: The cavity size was normal. Systolic function was normal. - Pulmonary arteries: No complete TR doppler jet so unable to estimate PA systolic pressure. - Inferior vena cava: The vessel was normal in size. The respirophasic diameter changes were in the normal range (>= 50%), consistent with normal central venous pressure.  Impressions:  - Normal  LV size with EF 50%, mild diffuse  hypokinesis. Normal RV size and systolic function. Bileaflet mitral valve prolapse with mild to moderate late systolic mitral regurgitation.   Coronary Stent Intervention 02/2017  Conclusion     Mid RCA lesion, 90 %stenosed.  A STENT RESOLUTE ONYX 4.0X12 drug eluting stent was successfully placed.  Prox RCA to Mid RCA lesion, 90 %stenosed.  Post intervention, there is a 0% residual stenosis.  Successful stenting of the mid RCA with DES for in stent restenosis.  Plan: DAPT for one year with ASA and Plavix. May be suitable for same day discharge.      Assessment / Plan:  1. CAD - s/p PCI to the RCA March of 2018 - off of DAPT. No ischemia from Myoview from 08/2017. He never had chest pain prior to his PCI - has had a few spells since. He is managed medically. Felt to be doing well today. Refilled his medicines. Lab today.   2. LV dysfunction - EF is 47% by cardiac MRI - on beta blocker. BP remains soft - we have left him on his current regimen. Doing well clinically.   3. Tobacco abuse- has smoked 2 cigs a day for many years. Don't see this changing.   4. HTN - BP remains pretty soft - he is not symptomatic - for now will follow and no changes were made today.   5. HLD - lab today  6. Mitral regurgitation - mild to moderate MR from last echo - no real murmur that I appreciate on exam again today. Would follow.   7. Psyche disorder - he seems to be back on his medicines. He is followed by [facility]. Not discussed today.    Current medicines are reviewed with the patient today.  The patient does not have concerns regarding medicines other than what has been noted above.  The following changes have been made:  See above.  Labs/ tests ordered today include:    Orders Placed This Encounter  Procedures  . Basic metabolic panel  . Hepatic function panel  . Lipid panel  . CBC  . EKG 12-Lead     Disposition:   FU with me in 6 months.   Patient is  agreeable to this plan and will call if any problems develop in the interim.   SignedTruitt Merle, NP  08/30/2018 3:30 PM  [facility] [address] [place], [place]  [address] Phone: [phone] Fax: [phone]

## 2018-08-31 LAB — BASIC METABOLIC PANEL
BUN/Creatinine Ratio: 10 (ref 10–24)
BUN: 9 mg/dL (ref 8–27)
CO2: 26 mmol/L (ref 20–29)
Calcium: 9.4 mg/dL (ref 8.6–10.2)
Chloride: 103 mmol/L (ref 96–106)
Creatinine, Ser: 0.87 mg/dL (ref 0.76–1.27)
GFR calc Af Amer: 108 mL/min/{1.73_m2} (ref >59)
GFR calc non Af Amer: 94 mL/min/{1.73_m2} (ref >59)
Glucose: 102 mg/dL — ABNORMAL HIGH (ref 65–99)
Potassium: 4.6 mmol/L (ref 3.5–5.2)
Sodium: 142 mmol/L (ref 134–144)

## 2018-08-31 LAB — CBC
Hematocrit: 40.7 % (ref 37.5–51.0)
Hemoglobin: 14.1 g/dL (ref 13.0–17.7)
MCH: 31.9 pg (ref 26.6–33.0)
MCHC: 34.6 g/dL (ref 31.5–35.7)
MCV: 92 fL (ref 79–97)
Platelets: 201 10*3/uL (ref 150–450)
RBC: 4.42 x10E6/uL (ref 4.14–5.80)
RDW: 13.6 % (ref 12.3–15.4)
WBC: 7 10*3/uL (ref 3.4–10.8)

## 2018-08-31 LAB — HEPATIC FUNCTION PANEL
ALT: 34 IU/L (ref 0–44)
AST: 20 IU/L (ref 0–40)
Albumin: 4.3 g/dL (ref 3.6–4.8)
Alkaline Phosphatase: 68 IU/L (ref 39–117)
Bilirubin Total: 0.4 mg/dL (ref 0.0–1.2)
Bilirubin, Direct: 0.1 mg/dL (ref 0.00–0.40)
Total Protein: 6.5 g/dL (ref 6.0–8.5)

## 2018-08-31 LAB — LIPID PANEL
Chol/HDL Ratio: 4.9 ratio (ref 0.0–5.0)
Cholesterol, Total: 182 mg/dL (ref 100–199)
HDL: 37 mg/dL — ABNORMAL LOW (ref >39)
LDL Calculated: 109 mg/dL — ABNORMAL HIGH (ref 0–99)
Triglycerides: 180 mg/dL — ABNORMAL HIGH (ref 0–149)
VLDL Cholesterol Cal: 36 mg/dL (ref 5–40)

## 2018-09-01 ENCOUNTER — Telehealth: Payer: Self-pay | Admitting: Nurse Practitioner

## 2018-09-01 NOTE — Telephone Encounter (Signed)
Follow Up: ° ° ° ° ° °Returning your call from yesterday, concerning his lab results. °

## 2018-09-02 NOTE — Telephone Encounter (Signed)
Follow Up:     Returning Danielle's call from yesterday, concerning his lab results.Derrick [name]

## 2018-09-02 NOTE — Telephone Encounter (Signed)
Left message to call back  

## 2018-09-08 ENCOUNTER — Other Ambulatory Visit: Payer: Self-pay | Admitting: *Deleted

## 2018-09-08 DIAGNOSIS — I340 Nonrheumatic mitral (valve) insufficiency: Secondary | ICD-10-CM

## 2018-09-13 ENCOUNTER — Other Ambulatory Visit (HOSPITAL_COMMUNITY): Payer: Medicare HMO

## 2018-09-16 ENCOUNTER — Other Ambulatory Visit (HOSPITAL_COMMUNITY): Payer: Medicare HMO

## 2018-09-20 ENCOUNTER — Ambulatory Visit (INDEPENDENT_AMBULATORY_CARE_PROVIDER_SITE_OTHER): Payer: Medicare HMO

## 2018-09-20 DIAGNOSIS — Z23 Encounter for immunization: Secondary | ICD-10-CM

## 2018-09-22 ENCOUNTER — Other Ambulatory Visit: Payer: Self-pay

## 2018-09-22 ENCOUNTER — Ambulatory Visit (HOSPITAL_COMMUNITY): Payer: Medicare HMO | Attending: Cardiovascular Disease

## 2018-09-22 DIAGNOSIS — I340 Nonrheumatic mitral (valve) insufficiency: Secondary | ICD-10-CM

## 2018-10-04 ENCOUNTER — Ambulatory Visit: Payer: Self-pay | Admitting: Psychiatry

## 2018-10-25 ENCOUNTER — Ambulatory Visit: Payer: Self-pay | Admitting: Psychiatry

## 2018-10-26 ENCOUNTER — Ambulatory Visit: Payer: Medicare HMO | Admitting: Psychiatry

## 2018-10-26 DIAGNOSIS — F068 Other specified mental disorders due to known physiological condition: Secondary | ICD-10-CM

## 2018-10-26 DIAGNOSIS — R419 Unspecified symptoms and signs involving cognitive functions and awareness: Secondary | ICD-10-CM

## 2018-10-26 DIAGNOSIS — G479 Sleep disorder, unspecified: Secondary | ICD-10-CM

## 2018-10-26 DIAGNOSIS — F600 Paranoid personality disorder: Secondary | ICD-10-CM | POA: Diagnosis not present

## 2018-10-26 DIAGNOSIS — F902 Attention-deficit hyperactivity disorder, combined type: Secondary | ICD-10-CM | POA: Diagnosis not present

## 2018-10-26 DIAGNOSIS — F319 Bipolar disorder, unspecified: Secondary | ICD-10-CM | POA: Diagnosis not present

## 2018-10-26 DIAGNOSIS — F3170 Bipolar disorder, currently in remission, most recent episode unspecified: Secondary | ICD-10-CM | POA: Diagnosis not present

## 2018-10-26 HISTORY — DX: Sleep disorder, unspecified: G47.9

## 2018-10-26 NOTE — Progress Notes (Signed)
[facility] Counselor/Therapist Progress Note   Patient ID: Derrick [name], MRN: [id number]  Date: 10/26/2018  Start time: 9:17a  Stop time: 10:17a Time Spent: 60 min  Treatment Type: Individual  Subjective: "Thank God I didn't have to fuck with that tablet" up front checking in.  About 3 months since last seen.  Has had colonoscopy about 2 months ago, clean results far as he knows.  "I'm on 'high kill' today."  Had something happen to trigger fantasies of revenge, violence.  About a month ago, a big Lab attacked one of his Dachsunds, found out same dog jumped on a neighbor dog.  Owner Animator) unapologetic, racking up veterinary charges, owner played him off when he tried to take the bill.  Ruminating about it, taking it as personal indictment of his intelligence.  Resented, started thinking about social status (neighbor high, patient low), went over to confront him about talking to the neighbor about the request for reimbursement.  Quick to use "mf" language and to flip off the neighbor over allegedly talking behind his back, lost track some of the issue of reimbursement.    Interventions: Ego-Supportive and crisis containment, practical counseling Supportively confronted temptations to violence to solve problems, dysfunctional habit of nursing resenting while stifling speech, and racing to the worst assumptions about others when they bother him.  On a spiritual level, within his faith, confronted "the [place] game" to do just that in order to win PT's downfall, spoil his witness, etc.  Confronted basic desire, too, to solicit a good fight so he could feel better for a minute and cautioned that license to rage is an addiction in itself if he gives it room to run.  Advised constructive, self-validating options for dealing with offenses mentioned, including a police report, small-claims court, getting wife involved, and reframed his message and ways to stick to the strong points most likely to  resonate with neighbor, who clearly seems to be gaming PT but it does not warrant violence.  PT pledges safety and willingness to ask help idf that changes.  Mental Status Exam:    Appearance:   Casual     Behavior:  Agitated  Motor:  Normal  Speech/Language:   Pressured and mildly; typically impresionistic and colorful at times  Affect:  angry, appropriate to topic  Mood:  irritable  Thought process:  ruminative/resentful  Thought content:    Paranoid Ideation and Rumination  Sensory/Perceptual disturbances:    WNL  Orientation:  WNL  Attention:  Fair  Concentration:  Fair  Memory:  grossly intact  Fund of knowledge:   Poor  Insight:    Fair  Judgment:   Fair  Impulse Control:  Fair    Risk Assessment: Danger to Self:  No Self-injurious Behavior: No Danger to Others: Yes.  without intent/plan Duty to Warn:no Physical Aggression / Violence:Yes  Access to Firearms a concern: Yes  Gang Involvement:No  Pledges safe judgment, ability to avoid contact with subject, willingness to get family and health care involved rather than act on it  Diagnosis:   ICD-10-CM   1. Bipolar disorder in partial remission, most recent episode unspecified type ([place]) F31.70   2. 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 Cognitive dysfunction in bipolar disorder ([place]) F06.8    F31.9   4. Sleep disorder with cognitive complaints G47.9    R41.9   5. Attention deficit hyperactivity disorder (ADHD), combined type F90.2     Plan:  . Use safety  measures . Try to imagine and mentally practice constructive action to address offense . Continue to utilize previously learned skills ad lib . Maintain medication, if prescribed, and work faithfully with relevant prescriber(s) . Call the clinic on-call service, present to ER, or call 911 if any life-threatening emergency . Follow up with me 1-4 weeks as able.   [name], PhD

## 2018-11-01 ENCOUNTER — Telehealth: Payer: Self-pay | Admitting: Psychiatry

## 2018-11-01 NOTE — Telephone Encounter (Signed)
Reasonable.  Need for him to give [place] the information on his jury summons, including panel, juror number, date, and what court.  Standard rate per our fee schedule ($15?).  Can address it c/o PT once I have that.  I don't know how billing works now that we are in [employer], so please figure that out.  And let me know if he needs it sent to him or hold it until 11/25/18 visit.

## 2018-11-01 NOTE — Telephone Encounter (Signed)
Would like a letter excusing from jury duty. Please inform him of the price .States his current health issues would prevent him from serving

## 2018-11-25 ENCOUNTER — Ambulatory Visit: Payer: Medicare HMO | Admitting: Psychiatry

## 2018-11-25 DIAGNOSIS — F902 Attention-deficit hyperactivity disorder, combined type: Secondary | ICD-10-CM

## 2018-11-25 DIAGNOSIS — G4733 Obstructive sleep apnea (adult) (pediatric): Secondary | ICD-10-CM

## 2018-11-25 DIAGNOSIS — R419 Unspecified symptoms and signs involving cognitive functions and awareness: Secondary | ICD-10-CM | POA: Diagnosis not present

## 2018-11-25 DIAGNOSIS — F600 Paranoid personality disorder: Secondary | ICD-10-CM | POA: Diagnosis not present

## 2018-11-25 DIAGNOSIS — F068 Other specified mental disorders due to known physiological condition: Secondary | ICD-10-CM

## 2018-11-25 DIAGNOSIS — F3170 Bipolar disorder, currently in remission, most recent episode unspecified: Secondary | ICD-10-CM

## 2018-11-25 DIAGNOSIS — F319 Bipolar disorder, unspecified: Secondary | ICD-10-CM | POA: Diagnosis not present

## 2018-11-25 DIAGNOSIS — G479 Sleep disorder, unspecified: Secondary | ICD-10-CM

## 2018-11-25 NOTE — Assessment & Plan Note (Signed)
Has not used CPAP since diagnosis, citing insurance restrictions.  May be very outdated knowledge; redirected to look back into it due to current problems and cumulative risks of untreated sleep disorder. - [name], PhD LP 11/25/18

## 2018-11-25 NOTE — Patient Instructions (Addendum)
We talked about sleep problems.  Things that make you wake up at night include:  Caffeine or any other stimulants too late in the day (Diet Mtn Dew)  Long naps in the daytime  Computer and TV light in the evening  Too dark in the house during the day  Not enough movement early in the day  Bad breathing -- you snore badly, you have diagnosed sleep apnea, and you have been recommended for a CPAP machine  Things you can do to help yourself sleep well are:  No caffeine after 2pm  Keep nap down to 1 hour or less  Get the amber glasses ($8.50 on [place]) and wear them starting by 9pm at night.  It is proven that getting sunset colors in your eyes in the evening tells your body chemistry what time it is, and Bipolar Disorder is really sensitive to body chemistry.  You can't help but sleep better if you do.  Get the lights on better during the morning  Get a morning walk, even if you feel like lying around the rest of the day  Get the CPAP machine and use it  As medications go, you can try;  Over-the-counter sleep medication (e.g., Simply Sleep, Sominex, or benadryl)  Over-the-counter melatonin pill -- start with 5mg  1 hour before bed  The worst things you could do are:  Drink  Do street drugs  Keep going without a CPAP machine  Load up on TV and computer screens without the sunglasses

## 2018-11-25 NOTE — Progress Notes (Signed)
Psychotherapy Progress Note -- [name], PhD, [facility]  Patient ID: TYSE AURIEMMA     MRN: [id number]     Date: 11/25/2018     Therapy format: Individual psychotherapy Start: 1:13p Stop: 2:01p Time Spent: 48 min Accompanied by: none  Session narrative -- interim history, self-report of stressors and symptoms, applications of prior therapy, status changes, and interventions in session No actions taken about neighbor, just grudges on it from time to time, wife included.    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  That said, has recognized changes of people there, certain people retired.    Doing well with wife, albeit with complications.  Says she complains of him not doing much, sleeping during the day, has called him useless.    C/o 3-hour waking most every night, wants a sleeping pill.  Then gets into habit of eating and smoking, may be up 2a-5a.  Can take long naps, nod off whenever still.  STM continues limited.  No volunteered c/o losing things but suspect still does.  Has OSA diagnosed several years ago but never got CPAP due to going off [employer] plan and allegedly not covered without full obstructive episodes.  (Apparently unaware of provisions of newer insurance and Medicare and unlikely to think of asking physician.)    Discussed sleep hygiene measures, reviewed recommended treatments, educated in approaching medical provider(s) appropriately for these.  C/o wife pooh-poohing the amber glasses, hasn't gotten them yet because he isn't allowed to handle money and she doesn't take the need seriously.  Reviewed priority points of sleep hygiene, made notes for take-home on AVS, strongly recommended both amber glasses and CPAP, and coached in representing these recommendations to allegedly skeptical, legally responsible wife.  Open offer to  include her in sessions to make sure "local authority" is aware and understands them.  Therapeutic modalities: Cognitive Behavioral Therapy, Psycho-education/Bibliotherapy and med psych  Mental Status/Observations:  Appearance:   Casual and Well Groomed     Behavior:  Appropriate  Motor:  Normal  Speech/Language:   Normal Rate  Affect:  Full Range  Mood:  irritated but less, responsive to therapy  Thought process:  simplistic (baseline) but responsive to therapy  Thought content:    Rumination and some fantasies of revenge  Sensory/Perceptual disturbances:    WNL  Orientation:  intact  Attention:  Good  Concentration:  Fair  Memory:  grossly intact for conversation; known chronic STM problems   Insight:    Fair  Judgment:   Fair  Impulse Control:  Poor   Risk Assessment: Danger to Self:  No Self-injurious Behavior: No Danger to Others: No Duty to Warn:no Physical Aggression / Violence:fantasies, continuing pledge not to act on them Access to Firearms a concern: Yes   Diagnosis:   ICD-10-CM   1. Bipolar disorder in partial remission, most recent episode unspecified type ([place]) F31.70   2. 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 Sleep disorder with cognitive complaints G47.9    R41.9   4. Cognitive dysfunction in bipolar disorder ([place]) F06.8    F31.9   5. Obstructive sleep apnea G47.33    Reports has never gotten CPAP due to change of insurance (several years ago) and family resistance to it. - AM  6. Attention deficit hyperactivity disorder (ADHD), combined type F90.2     Assessment of progress:  improving  Plan:  . Provided sleep therapy instructions and reference for amber glasses.  Go over them with wife, reinforce as medical advice if necessary, and get blue filtering and CPAP started. . Maintain safety vs. any violent urges toward neighbor . Continue to utilize previously learned skills ad lib . Maintain medication, if prescribed, and work faithfully with  relevant prescriber(s) . Call the clinic on-call service, present to ER, or call 911 if any life-threatening emergency . Follow up with me in about 1 mo  [name], PhD

## 2018-11-27 ENCOUNTER — Other Ambulatory Visit: Payer: Self-pay | Admitting: Physician Assistant

## 2018-11-29 ENCOUNTER — Encounter: Payer: Self-pay | Admitting: Emergency Medicine

## 2018-11-29 DIAGNOSIS — G4700 Insomnia, unspecified: Secondary | ICD-10-CM | POA: Insufficient documentation

## 2018-12-16 ENCOUNTER — Ambulatory Visit: Payer: Medicare HMO | Admitting: Physician Assistant

## 2018-12-16 ENCOUNTER — Encounter: Payer: Self-pay | Admitting: Physician Assistant

## 2018-12-16 DIAGNOSIS — G4700 Insomnia, unspecified: Secondary | ICD-10-CM

## 2018-12-16 DIAGNOSIS — Z79899 Other long term (current) drug therapy: Secondary | ICD-10-CM

## 2018-12-16 DIAGNOSIS — F319 Bipolar disorder, unspecified: Secondary | ICD-10-CM

## 2018-12-16 MED ORDER — ZOLPIDEM TARTRATE 10 MG PO TABS: 10.0000 mg | ORAL_TABLET | Freq: Every evening | ORAL | 1 refills | Status: DC | PRN

## 2018-12-16 MED ORDER — ESCITALOPRAM OXALATE 20 MG PO TABS: 20.0000 mg | ORAL_TABLET | Freq: Every day | ORAL | 1 refills | Status: DC

## 2018-12-16 NOTE — Progress Notes (Signed)
[facility]  Patient ID: Derrick [name],  MRN: [id number]  PCP: [name], [name]  Date of Evaluation: 12/16/2018 Time spent:15 minutes  Chief Complaint:  Chief Complaint    Follow-up; Insomnia      HISTORY/CURRENT STATUS: HPI For 3 month med check.  A few weeks ago, he was having trouble sleeping.  So he started drinking a shot of bourbon every night for 4 nights.  "I realized I could get addicted to that so I stopped it.  I do need something to help me sleep though.  I can't get to sleep or stay asleep."  Going on for a long time, but worse in the past few months.  Doesn't get tired till about 4 am.  Misses the sleep.  Plans to get a new bed to see if that'll help.    Patient denies loss of interest in usual activities and is able to enjoy things.  Denies decreased energy or motivation.  Appetite has not changed.  No extreme sadness, tearfulness, or feelings of hopelessness.  Denies any changes in concentration, making decisions or remembering things.  Denies suicidal or homicidal thoughts.  Patient denies increased energy with decreased need for sleep, no increased talkativeness, no racing thoughts, no impulsivity or risky behaviors, no increased spending, no increased libido, no grandiosity.  We weaned off Wellbutrin.  He's doing well.  Doesn't feel as anxious.    Anxiety is pretty controlled.   Individual Medical History/ Review of Systems: Changes? :No   Allergies: Patient has no known allergies.  Current Medications:  Current Outpatient Medications:  .  acetaminophen (TYLENOL) 500 MG tablet, Take 500 mg by mouth every 6 (six) hours as needed for mild pain. , Disp: , Rfl:  .  aspirin 81 MG tablet, Take 81 mg by mouth daily., Disp: , Rfl:  .  atorvastatin (LIPITOR) 80 MG tablet, TAKE 1 TABLET BY MOUTH DAILY, Disp: 90 tablet, Rfl: 3 .  divalproex (DEPAKOTE) 500 MG DR tablet, Take 1,000 mg by mouth at bedtime. , Disp: , Rfl:  .  escitalopram (LEXAPRO) 20 MG  tablet, Take 20 mg by mouth daily., Disp: , Rfl:  .  lamoTRIgine (LAMICTAL) 100 MG tablet, Take 100 mg by mouth at bedtime., Disp: , Rfl:  .  metoprolol tartrate (LOPRESSOR) 25 MG tablet, TAKE 1 1/2 TABLETS BY MOUTH TWICE DAILY, Disp: 270 tablet, Rfl: 3 .  montelukast (SINGULAIR) 10 MG tablet, TAKE 1 TABLET EVERY DAY, Disp: 90 tablet, Rfl: 1 .  nitroGLYCERIN (NITROSTAT) 0.4 MG SL tablet, Place 1 tablet (0.4 mg total) under the tongue every 5 (five) minutes as needed for chest pain., Disp: 25 tablet, Rfl: 3 .  pantoprazole (PROTONIX) 40 MG tablet, Take 1 tablet (40 mg total) by mouth daily., Disp: 90 tablet, Rfl: 3 .  clonazePAM (KLONOPIN) 1 MG tablet, Take 1 mg by mouth as needed for anxiety., Disp: , Rfl:  .  escitalopram (LEXAPRO) 20 MG tablet, Take 1 tablet (20 mg total) by mouth daily., Disp: 90 tablet, Rfl: 1 .  isosorbide mononitrate (IMDUR) 30 MG 24 hr tablet, Take 0.5 tablets (15 mg total) by mouth daily. (Patient not taking: Reported on 12/16/2018), Disp: 45 tablet, Rfl: 3 .  zolpidem (AMBIEN) 10 MG tablet, Take 1 tablet (10 mg total) by mouth at bedtime as needed for sleep., Disp: 30 tablet, Rfl: 1  Current Facility-Administered Medications:  .  0.9 %  sodium chloride infusion, 500 mL, Intravenous, Once, Jackquline Denmark, MD Medication Side Effects:  none  Family Medical/ Social History: Changes? No  MENTAL HEALTH EXAM:  There were no vitals taken for this visit.There is no height or weight on file to calculate BMI.  General Appearance: Casual and Well Groomed  Eye Contact:  Good  Speech:  Clear and Coherent  Volume:  Normal  Mood:  Euthymic  Affect:  Appropriate  Thought Process:  Goal Directed  Orientation:  Full (Time, Place, and Person)  Thought Content: Logical   Suicidal Thoughts:  No  Homicidal Thoughts:  No  Memory:  WNL  Judgement:  Good  Insight:  Good  Psychomotor Activity:  Normal  Concentration:  Concentration: Good  Recall:  Good  Fund of Knowledge: Good   Language: Good  Assets:  Desire for Improvement  ADL's:  Intact  Cognition: WNL  Prognosis:  Good    DIAGNOSES:    ICD-10-CM   1. Bipolar I disorder ([place]) F31.9   2. Insomnia, unspecified type G47.00   3. Encounter for long-term (current) use of medications Z79.899 Valproic acid level    CBC with Differential/Platelet    Comprehensive metabolic panel    Receiving Psychotherapy: Yes with Dr. Jonni Sanger [place]   RECOMMENDATIONS: continue all meds. Check labs as above. Cont therapy with Dr Rica Mote.   Donnal Moat, [name]

## 2018-12-17 LAB — CBC WITH DIFFERENTIAL/PLATELET
ABSOLUTE MONOCYTES: 634 {cells}/uL (ref 200–950)
BASOS ABS: 13 {cells}/uL (ref 0–200)
Basophils Relative: 0.2 %
EOS PCT: 1.4 %
Eosinophils Absolute: 90 cells/uL (ref 15–500)
HEMATOCRIT: 41.8 % (ref 38.5–50.0)
HEMOGLOBIN: 14.3 g/dL (ref 13.2–17.1)
LYMPHS ABS: 1754 {cells}/uL (ref 850–3900)
MCH: 31.8 pg (ref 27.0–33.0)
MCHC: 34.2 g/dL (ref 32.0–36.0)
MCV: 92.9 fL (ref 80.0–100.0)
MPV: 9.6 fL (ref 7.5–12.5)
Monocytes Relative: 9.9 %
NEUTROS ABS: 3910 {cells}/uL (ref 1500–7800)
NEUTROS PCT: 61.1 %
Platelets: 221 10*3/uL (ref 140–400)
RBC: 4.5 10*6/uL (ref 4.20–5.80)
RDW: 13 % (ref 11.0–15.0)
Total Lymphocyte: 27.4 %
WBC: 6.4 10*3/uL (ref 3.8–10.8)

## 2018-12-17 LAB — COMPREHENSIVE METABOLIC PANEL
AG RATIO: 2 (calc) (ref 1.0–2.5)
ALBUMIN MSPROF: 4.5 g/dL (ref 3.6–5.1)
ALKALINE PHOSPHATASE (APISO): 76 U/L (ref 40–115)
ALT: 45 U/L (ref 9–46)
AST: 27 U/L (ref 10–35)
BILIRUBIN TOTAL: 0.4 mg/dL (ref 0.2–1.2)
BUN: 11 mg/dL (ref 7–25)
CALCIUM: 9.5 mg/dL (ref 8.6–10.3)
CO2: 27 mmol/L (ref 20–32)
Chloride: 104 mmol/L (ref 98–110)
Creat: 0.81 mg/dL (ref 0.70–1.25)
Globulin: 2.2 g/dL (calc) (ref 1.9–3.7)
Glucose, Bld: 92 mg/dL (ref 65–99)
POTASSIUM: 4.3 mmol/L (ref 3.5–5.3)
SODIUM: 140 mmol/L (ref 135–146)
TOTAL PROTEIN: 6.7 g/dL (ref 6.1–8.1)

## 2018-12-17 LAB — VALPROIC ACID LEVEL: VALPROIC ACID LVL: 17.8 mg/L — AB (ref 50.0–100.0)

## 2018-12-20 ENCOUNTER — Other Ambulatory Visit: Payer: Self-pay | Admitting: Physician Assistant

## 2018-12-20 ENCOUNTER — Ambulatory Visit (INDEPENDENT_AMBULATORY_CARE_PROVIDER_SITE_OTHER): Payer: Medicare HMO | Admitting: Physician Assistant

## 2018-12-20 ENCOUNTER — Encounter: Payer: Self-pay | Admitting: Physician Assistant

## 2018-12-20 VITALS — BP 97/58 | HR 75 | Temp 97.5°F | Ht 71.0 in | Wt 191.4 lb

## 2018-12-20 DIAGNOSIS — Z0000 Encounter for general adult medical examination without abnormal findings: Secondary | ICD-10-CM | POA: Diagnosis not present

## 2018-12-20 DIAGNOSIS — Z79899 Other long term (current) drug therapy: Secondary | ICD-10-CM

## 2018-12-20 MED ORDER — MONTELUKAST SODIUM 10 MG PO TABS: 10.0000 mg | ORAL_TABLET | Freq: Every day | ORAL | 3 refills | Status: DC

## 2018-12-20 NOTE — Patient Instructions (Signed)

## 2018-12-21 LAB — PSA: Prostate Specific Ag, Serum: 1.4 ng/mL (ref 0.0–4.0)

## 2018-12-21 LAB — LIPID PANEL
Chol/HDL Ratio: 5.4 ratio — ABNORMAL HIGH (ref 0.0–5.0)
Cholesterol, Total: 172 mg/dL (ref 100–199)
HDL: 32 mg/dL — ABNORMAL LOW (ref >39)
LDL Calculated: 84 mg/dL (ref 0–99)
Triglycerides: 281 mg/dL — ABNORMAL HIGH (ref 0–149)
VLDL Cholesterol Cal: 56 mg/dL — ABNORMAL HIGH (ref 5–40)

## 2018-12-21 NOTE — Progress Notes (Signed)
BP (!) 97/58   Pulse 75   Temp (!) 97.5 F (36.4 C) (Oral)   Ht 5\' 11"  (1.803 m)   Wt 191 lb 6.4 oz (86.8 kg)   BMI 26.69 kg/m    Subjective:    Patient ID: Derrick [name], male    DOB: [birth year], 63 y.o.   MRN: [id number]  HPI: Derrick [name] is a 63 y.o. male presenting on 12/20/2018 for Annual Exam  This patient comes in for annual well physical examination. All medications are reviewed today. There are no reports of any problems with the medications. All of the medical conditions are reviewed and updated.  Lab work is reviewed and will be ordered as medically necessary. Patient reports overall he is doing well.  And is still seeing his psychiatrist for his chronic conditions.  They are well controlled.  He did have a positive Cologuard last year.  He did have follow-up with gastroenterology and there were 10 polyps seen.  He will be on a 3-year follow-up.  Past Medical History:  Diagnosis Date  . Anxiety   . Bipolar 1 disorder ([place])   . BPH (benign prostatic hypertrophy)   . CAD (coronary artery disease), native coronary artery    a. DES x 2 to RCA in 2004 b. 02/11/17 PCI w/DES x1 to mRCA  . Depression   . Hyperlipidemia   . Mitral regurgitation and aortic stenosis   . MVP (mitral valve prolapse)   . Myocardial infarction ([place])   . OSA (obstructive sleep apnea) 03/22/2013  . Sleep apnea    Relevant past medical, surgical, family and social history reviewed and updated as indicated. Interim medical history since our last visit reviewed. Allergies and medications reviewed and updated. DATA REVIEWED: CHART IN EPIC  Family History reviewed for pertinent findings.  Review of Systems  Constitutional: Negative.  Negative for appetite change and fatigue.  HENT: Negative.   Eyes: Negative.  Negative for pain and visual disturbance.  Respiratory: Negative.  Negative for cough, chest tightness, shortness of breath and wheezing.   Cardiovascular: Negative.  Negative for chest pain,  palpitations and leg swelling.  Gastrointestinal: Negative.  Negative for abdominal pain, diarrhea, nausea and vomiting.  Endocrine: Negative.   Genitourinary: Negative.   Musculoskeletal: Negative.   Skin: Negative.  Negative for color change and rash.  Neurological: Negative.  Negative for weakness, numbness and headaches.  Psychiatric/Behavioral: Negative.     Allergies as of 12/20/2018   No Known Allergies     Medication List       Accurate as of December 20, 2018 11:59 PM. Always use your most recent med list.        acetaminophen 500 MG tablet Commonly known as:  TYLENOL Take 500 mg by mouth every 6 (six) hours as needed for mild pain.   aspirin 81 MG tablet Take 81 mg by mouth daily.   atorvastatin 80 MG tablet Commonly known as:  LIPITOR TAKE 1 TABLET BY MOUTH DAILY   clonazePAM 1 MG tablet Commonly known as:  KLONOPIN Take 1 mg by mouth as needed for anxiety.   divalproex 500 MG DR tablet Commonly known as:  DEPAKOTE Take 1,000 mg by mouth at bedtime.   escitalopram 20 MG tablet Commonly known as:  LEXAPRO Take 1 tablet (20 mg total) by mouth daily.   isosorbide mononitrate 30 MG 24 hr tablet Commonly known as:  IMDUR Take 0.5 tablets (15 mg total) by mouth daily.   lamoTRIgine 100 MG  tablet Commonly known as:  LAMICTAL Take 100 mg by mouth at bedtime.   metoprolol tartrate 25 MG tablet Commonly known as:  LOPRESSOR TAKE 1 1/2 TABLETS BY MOUTH TWICE DAILY   montelukast 10 MG tablet Commonly known as:  SINGULAIR Take 1 tablet (10 mg total) by mouth daily.   nitroGLYCERIN 0.4 MG SL tablet Commonly known as:  NITROSTAT Place 1 tablet (0.4 mg total) under the tongue every 5 (five) minutes as needed for chest pain.   pantoprazole 40 MG tablet Commonly known as:  PROTONIX Take 1 tablet (40 mg total) by mouth daily.   zolpidem 10 MG tablet Commonly known as:  AMBIEN Take 1 tablet (10 mg total) by mouth at bedtime as needed for sleep.            Objective:    BP (!) 97/58   Pulse 75   Temp (!) 97.5 F (36.4 C) (Oral)   Ht 5\' 11"  (1.803 m)   Wt 191 lb 6.4 oz (86.8 kg)   BMI 26.69 kg/m   No Known Allergies  Wt Readings from Last 3 Encounters:  12/20/18 191 lb 6.4 oz (86.8 kg)  08/30/18 185 lb 12.8 oz (84.3 kg)  07/28/18 188 lb (85.3 kg)    Physical Exam Constitutional:      Appearance: He is well-developed.  HENT:     Head: Normocephalic and atraumatic.  Eyes:     Conjunctiva/sclera: Conjunctivae normal.     Pupils: Pupils are equal, round, and reactive to light.  Neck:     Musculoskeletal: Normal range of motion and neck supple.  Cardiovascular:     Rate and Rhythm: Normal rate and regular rhythm.     Heart sounds: Normal heart sounds.  Pulmonary:     Effort: Pulmonary effort is normal.     Breath sounds: Normal breath sounds.  Abdominal:     General: Bowel sounds are normal.     Palpations: Abdomen is soft.  Musculoskeletal: Normal range of motion.  Skin:    General: Skin is warm and dry.     Results for orders placed or performed in visit on 12/20/18  Lipid panel  Result Value Ref Range   Cholesterol, Total 172 100 - 199 mg/dL   Triglycerides 281 (H) 0 - 149 mg/dL   HDL 32 (L) >39 mg/dL   VLDL Cholesterol Cal 56 (H) 5 - 40 mg/dL   LDL Calculated 84 0 - 99 mg/dL   Chol/HDL Ratio 5.4 (H) 0.0 - 5.0 ratio  PSA  Result Value Ref Range   Prostate Specific Ag, Serum 1.4 0.0 - 4.0 ng/mL      Assessment & Plan:   1. Well adult exam - Lipid panel - PSA   Continue all other maintenance medications as listed above.  Follow up plan: Return in about 1 year (around 12/21/2019).  Educational handout given for health maintenance  [name] [name] [facility] [address]  [place], [place] [address] [phone]   12/21/2018, 12:42 PM

## 2018-12-24 ENCOUNTER — Telehealth: Payer: Self-pay

## 2018-12-24 DIAGNOSIS — K006 Disturbances in tooth eruption: Secondary | ICD-10-CM | POA: Diagnosis not present

## 2018-12-24 NOTE — Telephone Encounter (Signed)
Spoke with pt about lab results and low depakote level, pt states he is currently taking depakote 1000mg /hs. Instructed to increase dose to 1500mg /hs and repeat labs in 1 week. Pt verbalized understanding.

## 2018-12-24 NOTE — Telephone Encounter (Signed)
-----   Message from Addison Lank, [name] sent at 12/20/2018  4:00 PM EST ----- Tell Normon his labs are fine except the VPA is low.  Confirm dose of 1,000mg /d.  Increase to 1,500mg /d and repeat VPA level in 1 week.  I'll put the order in.

## 2018-12-29 ENCOUNTER — Ambulatory Visit: Payer: Medicare HMO | Admitting: Psychiatry

## 2018-12-29 DIAGNOSIS — R419 Unspecified symptoms and signs involving cognitive functions and awareness: Secondary | ICD-10-CM

## 2018-12-29 DIAGNOSIS — F3170 Bipolar disorder, currently in remission, most recent episode unspecified: Secondary | ICD-10-CM

## 2018-12-29 DIAGNOSIS — F902 Attention-deficit hyperactivity disorder, combined type: Secondary | ICD-10-CM | POA: Diagnosis not present

## 2018-12-29 DIAGNOSIS — Z87898 Personal history of other specified conditions: Secondary | ICD-10-CM

## 2018-12-29 DIAGNOSIS — G479 Sleep disorder, unspecified: Secondary | ICD-10-CM | POA: Diagnosis not present

## 2018-12-29 DIAGNOSIS — G4733 Obstructive sleep apnea (adult) (pediatric): Secondary | ICD-10-CM

## 2018-12-29 DIAGNOSIS — F1991 Other psychoactive substance use, unspecified, in remission: Secondary | ICD-10-CM

## 2018-12-29 NOTE — Progress Notes (Signed)
Psychotherapy Progress Note [facility]  Patient ID: Derrick [name]     MRN: [id number]      Date: 12/29/2018     Start: 1:23p Stop: 1:13p Time Spent: 50 min Therapy format: Individual psychotherapy  Session narrative -- presenting needs, interim history, self-report of stressors and symptoms, applications of prior therapy, status changes, and interventions in session Broke the ice on church not long ago after a long time away, looking to return tonight.  Hx of wife begging off.  Had a tooth cut out, work done well, had short-leash Vicodin that pharmacy balked at filling and wife declined, precipitating conflict.  Wife changed her mind, got it, PT only used 1.    Daughter [name] has graduated [employer], successfully treated anorexia, still worried about her, with admission that she has "not been eating right", grieving psychiatrist's retirement.  Hx of also grieving a cat she was long-bonded with that developed an intolerable bowel problem, eventually put down, but deeply guilty about.    Admits trying liquor as a sleep aid in December, recognized it as risky, addressed with PA 2 weeks ago, now Ambien 10mg .  Leery of letting wife know, fears she will see it as just drug abuse (hx of).  Confronted secret-keeping as reinforcing the problem of being perceived as a sneak or an addict.  Strongly recommended   Sleep-wake cycle disturbed -- long habit now of dfxs with falling asleep when idle, some DFA at night.  OSA treated with CPAP, but the machine is reportedly noisy.  Wife complains about daytime napping, PT concerned any discussion of sleeping med will just devolve into criticism of napping.  Discussed and framed a problem of sleep-wake cycle being off, and medication being a legitimate, hopefully temporary, way to help correct it.  Firm offer to be present and include wife if PT needs any of these things interpreted.  Modelled assertive responses to doubt and criticism, including pointing  out that they want the same things (better sleep, freedom from addiction) and he is informing her because he knows she worries about secrets and misuse of meds.  Agrees, will try.  Therapeutic modalities: Cognitive Behavioral Therapy, Assertiveness/Communication and Psycho-education/Bibliotherapy  Mental Status/Observations:  Appearance:   Casual     Behavior:  Appropriate  Motor:  Normal  Speech/Language:   Normal Rate and reasonably goal-directed  Affect:  Appropriate  Mood:  normal and closer to baseline, decent energy, mild irritability  Thought process:  less loose, appropriate to topic, some overvalued ideas of conflict  Thought content:    WNL  Sensory/Perceptual disturbances:    WNL  Orientation:  Intact  Attention:  Good  Concentration:  Fair  Memory:  intact for conversation  Insight:    Fair  Judgment:   Fair  Impulse Control:  Fair   Risk Assessment: Danger to Self:  No Self-injurious Behavior: No Danger to Others: No Duty to Warn:no Physical Aggression / Violence:No  Access to Firearms a concern: No   Diagnosis:   ICD-10-CM   1. Bipolar disorder in partial remission, most recent episode unspecified type ([place]) F31.70   2. Sleep disorder with cognitive complaints G47.9    R41.9   3. Obstructive sleep apnea G47.33   4. Attention deficit hyperactivity disorder (ADHD), combined type F90.2   5. History of drug use disorder Z87.898    Remote -- teens into 30s    Assessment of progress:  stable  Plan:  . Recommendations/advice as noted above . Continue to utilize  previously learned skills ad lib . Maintain medication, if prescribed, and work faithfully with relevant prescriber(s) . Call the clinic on-call service, present to ER, or call 911 if any life-threatening emergency . Follow up with me in about 2-4 weeks.  Saturday appt available if need the flexibility for family visit.    [name], PhD [facility] Licensed Psychologist

## 2018-12-31 ENCOUNTER — Encounter: Payer: Self-pay | Admitting: *Deleted

## 2019-01-10 ENCOUNTER — Telehealth: Payer: Self-pay | Admitting: Physician Assistant

## 2019-01-12 NOTE — Telephone Encounter (Signed)
error 

## 2019-01-27 ENCOUNTER — Ambulatory Visit: Payer: Medicare HMO | Admitting: Psychiatry

## 2019-01-27 ENCOUNTER — Encounter: Payer: Self-pay | Admitting: Physician Assistant

## 2019-01-27 ENCOUNTER — Ambulatory Visit: Payer: Medicare HMO | Admitting: Physician Assistant

## 2019-01-27 DIAGNOSIS — F1411 Cocaine abuse, in remission: Secondary | ICD-10-CM | POA: Diagnosis not present

## 2019-01-27 DIAGNOSIS — F1611 Hallucinogen abuse, in remission: Secondary | ICD-10-CM

## 2019-01-27 DIAGNOSIS — Z87898 Personal history of other specified conditions: Secondary | ICD-10-CM | POA: Diagnosis not present

## 2019-01-27 DIAGNOSIS — F902 Attention-deficit hyperactivity disorder, combined type: Secondary | ICD-10-CM

## 2019-01-27 DIAGNOSIS — R419 Unspecified symptoms and signs involving cognitive functions and awareness: Secondary | ICD-10-CM

## 2019-01-27 DIAGNOSIS — G479 Sleep disorder, unspecified: Secondary | ICD-10-CM

## 2019-01-27 DIAGNOSIS — G4700 Insomnia, unspecified: Secondary | ICD-10-CM | POA: Diagnosis not present

## 2019-01-27 DIAGNOSIS — G4733 Obstructive sleep apnea (adult) (pediatric): Secondary | ICD-10-CM

## 2019-01-27 DIAGNOSIS — F1011 Alcohol abuse, in remission: Secondary | ICD-10-CM | POA: Diagnosis not present

## 2019-01-27 DIAGNOSIS — F3170 Bipolar disorder, currently in remission, most recent episode unspecified: Secondary | ICD-10-CM

## 2019-01-27 DIAGNOSIS — F1291 Cannabis use, unspecified, in remission: Secondary | ICD-10-CM

## 2019-01-27 DIAGNOSIS — F1991 Other psychoactive substance use, unspecified, in remission: Secondary | ICD-10-CM

## 2019-01-27 DIAGNOSIS — Z79899 Other long term (current) drug therapy: Secondary | ICD-10-CM | POA: Diagnosis not present

## 2019-01-27 MED ORDER — ZOLPIDEM TARTRATE ER 12.5 MG PO TBCR: 12.5000 mg | EXTENDED_RELEASE_TABLET | Freq: Every evening | ORAL | 1 refills | Status: DC | PRN

## 2019-01-27 NOTE — Progress Notes (Signed)
Psychotherapy Progress Note [facility] [name], PhD LP  Patient ID: Derrick [name]     MRN: [id number]      Therapy format: Individual psychotherapy Date: 01/27/2019     Start: 2:15p Stop: 3:00 Time Spent: 45 min  Session narrative -- presenting needs, interim history, self-report of stressors and symptoms, applications of prior therapy, status changes, and interventions made in session [name] is getting healthier vs. anorexia.  Back in church, but no particular worries about how he is judged -- new congregation, seems easygoing, not driving himself to get involved, take on responsibilities, etc.  Still self-conscious, about his vocabulary, capabilities, risk of seeming dumb to others.  [employer], large sanctuary, doesn't feel crowded.    "I still have this immaturity" -- people come flying down his narrow neighborhood road, gets him upset for fear of being hit or having the dog hit.  Validated anxiety.  Has more dental work, Rx'd Vicodin, which Production manager, who alarmed wife (reported lat visit).  Last week another one done, alternative prescription reportedly for oxycontin, pharmacy unable to fill, [name] told them not to bother.  She is sensitized to his old history of substance abuse and times in last 5 years where she suspected over-use of sedatives, but she does not understand that downers never were his thing in the days of addiction.  Reviewed history of substance abuse -- favored alcohol, cocaine, cannabis, and LSD, all long abstinent.    Medically, Depakote increased earlier this afternoon, and sleeping pill changed.  Admits he had an amnestic episode with Ambien 15mg , eating cereal w/o remembering.   Not on melatonin, and wife will not believe the blue light information.  Discussed alternative ways to obtain glasses.  Briefed on what benefit to expect of Depakote increase.  Primed positive expectation for Depakote to better enable him to let  irritations go, like frustrations with electronics   Had to cope with accusation of pornography, charged to wife's credit card, that turned out to be fraudulent use.  Irritated, and it took some time to convince [place], but eventually able.  Therapeutic modalities: Cognitive Behavioral Therapy and Solution-Oriented/Positive Psychology  Mental Status/Observations:  Appearance:   Casual     Behavior:  Appropriate  Motor:  Mannerisms  Speech/Language:   Clear and Coherent  Affect:  Appropriate  Mood:  euthymic and varied with subject  Thought process:  concrete  Thought content:    WNL  Sensory/Perceptual disturbances:    WNL  Orientation:  intact  Attention:  Fair  Concentration:  Fair  Memory:  grossly intact for daily living  Insight:    Fair  Judgment:   Fair  Impulse Control:  Fair   Risk Assessment: Danger to Self:  No Self-injurious Behavior: No Danger to Others: No Duty to Warn:no Physical Aggression / Violence:No  Access to Firearms a concern: No   Diagnosis:   ICD-10-CM   1. History of cocaine abuse ([name]) F14.11   2. Bipolar disorder in partial remission, most recent episode unspecified type ([place]) F31.70   3. Obstructive sleep apnea G47.33   4. Sleep disorder with cognitive complaints G47.9    R41.9   5. Attention deficit hyperactivity disorder (ADHD), combined type F90.2   6. History of alcohol abuse F10.11   7. 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 History of marijuana use Z87.898    Assessment of progress:  stable  Plan:  . Continue restored church attendance . Offer to explain to  wife how limited quantity of painkiller is safe for PT, better than his reactions to intense pain . Offer to procure amber lenses for PT and sell at cost . Other recommendations/advice as noted above . Continue to utilize previously learned skills ad lib Remains disabled from any occupation due to symptom severity. . Maintain medication as prescribed and work faithfully  with relevant prescriber(s) if any changes are desired or seem indicated . Call the clinic on-call service, present to ER, or call 911 if any life-threatening emergency Return in about 1 month (around 02/25/2019).   [name], PhD [place] Licensed Psychologist

## 2019-01-27 NOTE — Progress Notes (Signed)
[facility]  Patient ID: Derrick [name],  MRN: [id number]  PCP: [name], [name]  Date of Evaluation: 01/27/2019 Time spent:15 minutes  Chief Complaint:  Chief Complaint    Insomnia; Follow-up      HISTORY/CURRENT STATUS: HPI For routine med check.   Still not sleeping well.  Has taken Ambien 15mg  and it still doesn't help.  He increase the dose on his own.  We increased the Depakote up and he feels fair.  Doesn't want to be bothered sometimes.  But is content where he is. Patient denies increased energy with decreased need for sleep, no increased talkativeness, no racing thoughts, no impulsivity or risky behaviors, no increased spending, no increased libido, no grandiosity.  Patient denies loss of interest in usual activities and is able to enjoy things.  Denies decreased energy or motivation.  Appetite has not changed.  No extreme sadness, tearfulness, or feelings of hopelessness.  Denies any changes in concentration, making decisions or remembering things.  Denies suicidal or homicidal thoughts.  Anxiety is "so-so."  He gets irritated when people go by his house too fast, for example.  He wants to "give them a piece of my mind." More irritable, but "I think it's my anxiety."  Since the Depakote was increased 12/24/18, hasn't decreased.   He has not been able to get his follow-up labs yet after the increase of Depakote.  Individual Medical History/ Review of Systems: Changes? :Yes had tooth extracted.  Past medications for mental health diagnoses include: Zoloft, Prozac, Effexor XR, Lexapro, Depakote, Lamictal, Wellbutrin, Xanax, Ambien, Sonata, Klonopin  Allergies: Patient has no known allergies.  Current Medications:  Current Outpatient Medications:  .  acetaminophen (TYLENOL) 500 MG tablet, Take 500 mg by mouth every 6 (six) hours as needed for mild pain. , Disp: , Rfl:  .  aspirin 81 MG tablet, Take 81 mg by mouth daily., Disp: , Rfl:  .  atorvastatin  (LIPITOR) 80 MG tablet, TAKE 1 TABLET BY MOUTH DAILY, Disp: 90 tablet, Rfl: 3 .  clonazePAM (KLONOPIN) 1 MG tablet, Take 1 mg by mouth as needed for anxiety., Disp: , Rfl:  .  divalproex (DEPAKOTE) 500 MG DR tablet, Take 1,500 mg by mouth at bedtime. , Disp: , Rfl:  .  escitalopram (LEXAPRO) 20 MG tablet, Take 1 tablet (20 mg total) by mouth daily., Disp: 90 tablet, Rfl: 1 .  isosorbide mononitrate (IMDUR) 30 MG 24 hr tablet, Take 0.5 tablets (15 mg total) by mouth daily., Disp: 45 tablet, Rfl: 3 .  lamoTRIgine (LAMICTAL) 100 MG tablet, Take 100 mg by mouth at bedtime., Disp: , Rfl:  .  metoprolol tartrate (LOPRESSOR) 25 MG tablet, TAKE 1 1/2 TABLETS BY MOUTH TWICE DAILY, Disp: 270 tablet, Rfl: 3 .  montelukast (SINGULAIR) 10 MG tablet, Take 1 tablet (10 mg total) by mouth daily., Disp: 90 tablet, Rfl: 3 .  nitroGLYCERIN (NITROSTAT) 0.4 MG SL tablet, Place 1 tablet (0.4 mg total) under the tongue every 5 (five) minutes as needed for chest pain., Disp: 25 tablet, Rfl: 3 .  pantoprazole (PROTONIX) 40 MG tablet, Take 1 tablet (40 mg total) by mouth daily., Disp: 90 tablet, Rfl: 3 .  zolpidem (AMBIEN CR) 12.5 MG CR tablet, Take 1 tablet (12.5 mg total) by mouth at bedtime as needed for sleep., Disp: 30 tablet, Rfl: 1  Current Facility-Administered Medications:  .  0.9 %  sodium chloride infusion, 500 mL, Intravenous, Once, Jackquline Denmark, MD Medication Side Effects: none  Family  Medical/ Social History: Changes? No  MENTAL HEALTH EXAM:  There were no vitals taken for this visit.There is no height or weight on file to calculate BMI.  General Appearance: Casual and Well Groomed  Eye Contact:  Good  Speech:  Clear and Coherent  Volume:  Normal  Mood:  Euthymic  Affect:  Appropriate  Thought Process:  Goal Directed  Orientation:  Full (Time, Place, and Person)  Thought Content: Logical   Suicidal Thoughts:  No  Homicidal Thoughts:  No  Memory:  WNL  Judgement:  Good  Insight:  Good   Psychomotor Activity:  Normal but fidgety, as usual  Concentration:  Concentration: Good  Recall:  Good  Fund of Knowledge: Good  Language: Good  Assets:  Desire for Improvement  ADL's:  Intact  Cognition: WNL  Prognosis:  Good    DIAGNOSES:    ICD-10-CM   1. Bipolar disorder in partial remission, most recent episode unspecified type ([place]) F31.70   2. Insomnia, unspecified type G47.00   3. History of drug use disorder Z87.898   4. Encounter for long-term (current) use of medications Z79.899     Receiving Psychotherapy: Yes With Dr. [name]   RECOMMENDATIONS: PDMP was reviewed. He will get the lab work drawn for the end of next week. DC Ambien 10 mg. Start Ambien CR 12.5 mg nightly as needed sleep. Continue Klonopin 1 mg he rarely uses. Continue Depakote ER 500 mg 3 nightly. Continue Lexapro 20 mg daily. Continue Lamictal 100 mg nightly. Continue psychotherapy with Dr. [name]. Return in 6 weeks.  Donnal Moat, [name]

## 2019-02-21 ENCOUNTER — Ambulatory Visit: Payer: Medicare HMO | Admitting: Nurse Practitioner

## 2019-02-24 ENCOUNTER — Ambulatory Visit: Payer: Medicare HMO | Admitting: Psychiatry

## 2019-02-28 ENCOUNTER — Ambulatory Visit: Payer: Medicare HMO | Admitting: Psychiatry

## 2019-03-08 ENCOUNTER — Ambulatory Visit: Payer: Medicare HMO | Admitting: Physician Assistant

## 2019-03-09 ENCOUNTER — Telehealth: Payer: Self-pay | Admitting: *Deleted

## 2019-03-09 NOTE — Telephone Encounter (Signed)
S/w pt no problems or complaints.  Will R/S pt till June.

## 2019-03-16 ENCOUNTER — Ambulatory Visit: Payer: Medicare HMO | Admitting: Nurse Practitioner

## 2019-03-16 ENCOUNTER — Ambulatory Visit (INDEPENDENT_AMBULATORY_CARE_PROVIDER_SITE_OTHER): Payer: Medicare HMO | Admitting: Psychiatry

## 2019-03-16 DIAGNOSIS — F068 Other specified mental disorders due to known physiological condition: Secondary | ICD-10-CM

## 2019-03-16 DIAGNOSIS — F600 Paranoid personality disorder: Secondary | ICD-10-CM | POA: Diagnosis not present

## 2019-03-16 DIAGNOSIS — F902 Attention-deficit hyperactivity disorder, combined type: Secondary | ICD-10-CM | POA: Diagnosis not present

## 2019-03-16 DIAGNOSIS — F3170 Bipolar disorder, currently in remission, most recent episode unspecified: Secondary | ICD-10-CM | POA: Diagnosis not present

## 2019-03-16 DIAGNOSIS — F319 Bipolar disorder, unspecified: Secondary | ICD-10-CM

## 2019-03-16 NOTE — Progress Notes (Signed)
Psychotherapy Progress Note [facility] Derrick Moore, PhD LP  Patient ID: Derrick [name]     MRN: [id number]     Therapy format: Individual psychotherapy Date: 03/16/2019     Start: 3:20p Stop: 4:05p Time Spent: 45 min  I connected with patient by Derrick [name], with his informed consent, and verified patient privacy and identity.  I was located at home and patient at home.  Session narrative -- presenting needs, interim history, self-report of stressors and symptoms, applications of prior therapy, status changes, and interventions made in session Finds the virus situation depressing.  Doesn't see much of others at all.  Still issue with neighbor, had worked up expectation the man would do something more to offend him, prepared to get pissed off, but to his surprise, neighbor spontaneously got back in touch to honor request from a number of weeks back to pay vet bill for the dog attack.  Had been bothered by lawn mowing dust getting on his car, too, but impressed that neighbor did honorably there.  Affirmed having said his peace and then let neighbor's conscience work and holding his own tongue when hostile feelings came up.  Irritated by neighbors disregarding social distancing.  Holding his tongue.  Feels wife being demanding now that she's home all the time Geophysicist/field seismologist).  Mostly about having to clean more, partly about less quiet time to himself.  Doing more vacuuming, would rather not, but willing, for the sake of peace in the house.  Getting along fairly well, he says.  Wishes they could get away to the beach, but can't get to the [place] now with COVID conditions.  COVID news can be scary also, Observing sanitation and contagion-prevention conditions, limited shopping, washing well.  Can daydream and play out scenarios in his imagination with idle time, which we have long said is not shameful, just an aspect of trying to keep his innocence when childhood could  be scary and adulthood disabling, and his private time belongs to him and is only his to judge.  Can smoke (cigarettes) intermittently.  Among other triggers, thinking about [place] dying.  Acknowledged in what has to be lifelong grieving.  FOO issue -- sister, 73yo, PT and wife visit and help but other sibs don't keep touch much, may ask about her without trying to get in touch.  Feels wrong that he, and a brother-in-law with cancer, are the only ones who seem to care and go visit, and the only ones taking it seriously how frail she is getting.  Finds his mouth going before his mind engages, criticizing or pushing family to call.  Knows sister appreciates the effort.  Another issue with racist comments from relatives, rubs him wrong, trying to hold tongue mostly, intervene/challenge.  Supported and affirmed progressive viewpoint as true to [employer] faith, Jesus' example.    Granddaughter getting married June 15 in [place], may set back further to have better chance of not having to social-distance for it.  Therapeutic modalities: Cognitive Behavioral Therapy, Ego-Supportive and faith-sensitive  Mental Status/Observations:  Appearance:   Not assessed     Behavior:  Not assessed  Motor:  Not assessed  Speech/Language:   Slowed  Affect:  Not assessed  Mood:  dysthymic, irritable and maybe less, more down  Thought process:  tangential  Thought content:    WNL  Sensory/Perceptual disturbances:    WNL  Orientation:  WNL  Attention:  Fair  Concentration:  Fair  Memory:  grossly intact  Insight:    Fair  Judgment:   Good  Impulse Control:  Fair   Risk Assessment: Danger to Self:  No Self-injurious Behavior: No Danger to Others: No Duty to Warn:no Physical Aggression / Violence:No  Access to Firearms a concern: No   Diagnosis:   ICD-10-CM   1. Bipolar disorder in partial remission, most recent episode unspecified type ([place]) F31.70   2. Attention deficit hyperactivity disorder  (ADHD), combined type F90.2   3. Cognitive dysfunction in bipolar disorder ([place]) F06.8    F31.9   4. 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 Keep working constructively with wife on changing rhythms of life during sequestration . Make sure to get enough change of scenery and balance of alone/together, inside/outside to hold back sense of confinement or depression with COVID . Other recommendations/advice as noted above . Continue to utilize previously learned skills ad lib Remains disabled from any occupation due to symptom severity. . Maintain medication as prescribed and work faithfully with relevant prescriber(s) if any changes are desired or seem indicated . Call the clinic on-call service, present to ER, or call 911 if any life-threatening psychiatric crisis Return in about 1 month (around 04/15/2019).   [name], PhD [place] Licensed Psychologist

## 2019-04-07 ENCOUNTER — Encounter: Payer: Self-pay | Admitting: Physician Assistant

## 2019-04-07 ENCOUNTER — Ambulatory Visit (INDEPENDENT_AMBULATORY_CARE_PROVIDER_SITE_OTHER): Payer: Medicare HMO | Admitting: Physician Assistant

## 2019-04-07 ENCOUNTER — Other Ambulatory Visit: Payer: Self-pay

## 2019-04-07 DIAGNOSIS — F411 Generalized anxiety disorder: Secondary | ICD-10-CM | POA: Diagnosis not present

## 2019-04-07 DIAGNOSIS — F600 Paranoid personality disorder: Secondary | ICD-10-CM

## 2019-04-07 DIAGNOSIS — F3170 Bipolar disorder, currently in remission, most recent episode unspecified: Secondary | ICD-10-CM

## 2019-04-07 MED ORDER — DIVALPROEX SODIUM 500 MG PO DR TAB: 1500.0000 mg | DELAYED_RELEASE_TABLET | Freq: Every day | ORAL | 1 refills | Status: DC

## 2019-04-07 NOTE — Progress Notes (Signed)
[facility]  Patient ID: Derrick [name],  MRN: [id number]  PCP: [name], [name]  Date of Evaluation: 04/07/2019 Time spent:15 minutes  Chief Complaint:  Chief Complaint    Follow-up     Virtual Visit via Telephone Note  I connected with patient by a video enabled telemedicine application or telephone, with their informed consent, and verified patient privacy and that I am speaking with the correct person using two identifiers.  I am private, in my home and the patient is at home.  I discussed the limitations, risks, security and privacy concerns of performing an evaluation and management service by telephone and the availability of in person appointments. I also discussed with the patient that there may be a patient responsible charge related to this service. The patient expressed understanding and agreed to proceed.   I discussed the assessment and treatment plan with the patient. The patient was provided an opportunity to ask questions and all were answered. The patient agreed with the plan and demonstrated an understanding of the instructions.   The patient was advised to call back or seek an in-person evaluation if the symptoms worsen or if the condition fails to improve as anticipated.  I provided 15 minutes of non-face-to-face time during this encounter.  HISTORY/CURRENT STATUS: HPI for 34-month med check.  Patient states he is doing well overall.  States he "got into it "with a man at [place] in the past few months.  The guy was being "ignorant but I got in my car and left and so did he.  That is the only time I been around anybody.  I think I am doing okay."  He has not had any impulsivity or risky behavior.  No other irritability then described.  No increased energy with decreased need for sleep.  No grandiosity.  No increased spending.  Patient denies loss of interest in usual activities and is able to enjoy things.  Denies decreased energy or motivation.   Appetite has not changed.  No extreme sadness, tearfulness, or feelings of hopelessness.  Denies any changes in concentration, making decisions or remembering things.  Denies suicidal or homicidal thoughts.  Anxiety is well controlled.  He is getting bored staying at home because of the coronavirus pandemic.  He sleeps pretty well.  When going over his medications we realized that he is taking for the Depakote when in my records he is supposed to be on 3.  There is a possibility that that is my mistake, but records show that in January when his Depakote level was low we increased from 2 pills to 3 pills a day.  About 2 weeks ago, patient states he got it in his head that he supposed to be taking 4 pills.  He started that at that time, has felt no difference at all mood wise or anything.  Denies tremor.  When he took higher dose in the past even up to 2500 mg or so, he did have a pretty significant tremor.  He has not had a Depakote level since January, mostly because of the coronavirus pandemic and not wanting to be around people who may be sick.  Denies muscle or joint pain, stiffness, or dystonia.  Denies dizziness, syncope, seizures, numbness, tingling, tremor, tics, unsteady gait, slurred speech, confusion.   Individual Medical History/ Review of Systems: Changes? :No    Past medications for mental health diagnoses include: Zoloft, Prozac, Effexor XR, Lexapro, Depakote, Lamictal, Wellbutrin, Xanax, Ambien, Sonata, Klonopin  Allergies:  Patient has no known allergies.  Current Medications:  Current Outpatient Medications:  .  acetaminophen (TYLENOL) 500 MG tablet, Take 500 mg by mouth every 6 (six) hours as needed for mild pain. , Disp: , Rfl:  .  aspirin 81 MG tablet, Take 81 mg by mouth daily., Disp: , Rfl:  .  atorvastatin (LIPITOR) 80 MG tablet, TAKE 1 TABLET BY MOUTH DAILY, Disp: 90 tablet, Rfl: 3 .  clonazePAM (KLONOPIN) 1 MG tablet, Take 1 mg by mouth as needed for anxiety., Disp: ,  Rfl:  .  divalproex (DEPAKOTE) 500 MG DR tablet, Take 3 tablets (1,500 mg total) by mouth at bedtime., Disp: 270 tablet, Rfl: 1 .  escitalopram (LEXAPRO) 20 MG tablet, Take 1 tablet (20 mg total) by mouth daily., Disp: 90 tablet, Rfl: 1 .  isosorbide mononitrate (IMDUR) 30 MG 24 hr tablet, Take 0.5 tablets (15 mg total) by mouth daily., Disp: 45 tablet, Rfl: 3 .  lamoTRIgine (LAMICTAL) 100 MG tablet, Take 100 mg by mouth at bedtime., Disp: , Rfl:  .  metoprolol tartrate (LOPRESSOR) 25 MG tablet, TAKE 1 1/2 TABLETS BY MOUTH TWICE DAILY, Disp: 270 tablet, Rfl: 3 .  montelukast (SINGULAIR) 10 MG tablet, Take 1 tablet (10 mg total) by mouth daily., Disp: 90 tablet, Rfl: 3 .  nitroGLYCERIN (NITROSTAT) 0.4 MG SL tablet, Place 1 tablet (0.4 mg total) under the tongue every 5 (five) minutes as needed for chest pain., Disp: 25 tablet, Rfl: 3 .  pantoprazole (PROTONIX) 40 MG tablet, Take 1 tablet (40 mg total) by mouth daily., Disp: 90 tablet, Rfl: 3 .  zolpidem (AMBIEN CR) 12.5 MG CR tablet, Take 1 tablet (12.5 mg total) by mouth at bedtime as needed for sleep., Disp: 30 tablet, Rfl: 1  Current Facility-Administered Medications:  .  0.9 %  sodium chloride infusion, 500 mL, Intravenous, Once, Jackquline Denmark, MD Medication Side Effects: none  Family Medical/ Social History: Changes? Yes shelter in place b/c coronavirus pandemic.  MENTAL HEALTH EXAM:  There were no vitals taken for this visit.There is no height or weight on file to calculate BMI.  General Appearance: telephone visit, unable to assess  Eye Contact:  unable to assess  Speech:  Clear and Coherent  Volume:  Normal  Mood:  Euthymic  Affect:  unable to assess  Thought Process:  Goal Directed  Orientation:  Full (Time, Place, and Person)  Thought Content: Logical   Suicidal Thoughts:  No  Homicidal Thoughts:  No  Memory:  WNL  Judgement:  Good  Insight:  Good  Psychomotor Activity:  unable to assess  Concentration:  Concentration:  Good  Recall:  Good  Fund of Knowledge: Good  Language: Good  Assets:  Desire for Improvement  ADL's:  Intact  Cognition: WNL  Prognosis:  Good    DIAGNOSES:    ICD-10-CM   1. Bipolar disorder in partial remission, most recent episode unspecified type ([place]) F31.70   2. 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 Generalized anxiety disorder F41.1     Receiving Psychotherapy: Yes    RECOMMENDATIONS: We discussed the Depakote.  Since he has not had any change in his mood over the past couple of weeks after the increased dose, I recommend he return to 3 pills daily.  He will get the labs drawn once the coronavirus pandemic has calm down and shelter in place orders are removed. Continue Klonopin 1 mg daily as needed. Decrease Depakote DR back to 1500 mg. Continue Lexapro 20  mg daily. Continue Lamictal 100 mg nightly. Continue Ambien CR 12.5 mg nightly as needed. Continue psychotherapy with Dr. [name]. Return in 3 months or sooner as needed.  Donnal Moat, [name]   This record has been created using [employer].  Chart creation errors have been sought, but may not always have been located and corrected. Such creation errors do not reflect on the standard of medical care.

## 2019-05-04 ENCOUNTER — Other Ambulatory Visit: Payer: Self-pay | Admitting: Physician Assistant

## 2019-06-06 ENCOUNTER — Telehealth: Payer: Self-pay

## 2019-06-06 ENCOUNTER — Ambulatory Visit: Payer: Medicare HMO | Admitting: Nurse Practitioner

## 2019-06-06 NOTE — Telephone Encounter (Signed)
Left message for patient to call back, need to go over screening covid19 questions before in office appointment with Tera Helper.

## 2019-06-07 ENCOUNTER — Telehealth: Payer: Self-pay | Admitting: *Deleted

## 2019-06-07 NOTE — Telephone Encounter (Signed)
      Pt is aware of ov tomorrow.   COVID-19 Pre-Screening Questions:  . In the past 7 to 10 days have you had a cough,  shortness of breath, headache, congestion, fever (100 or greater) body aches, chills, sore throat, or sudden loss of taste or sense of smell?  NO . Have you been around anyone with known Covid 19. NO . Have you been around anyone who is awaiting Covid 19 test results in the past 7 to 10 days?  NO . Have you been around anyone who has been exposed to Covid 19, or has mentioned symptoms of Covid 19 within the past 7 to 10 days?  NO  If you have any concerns/questions about symptoms patients report during screening (either on the phone or at [facility]). Contact the provider seeing the patient or DOD for further guidance.  If neither are available contact a member of the leadership team.

## 2019-06-07 NOTE — Progress Notes (Signed)
CARDIOLOGY OFFICE NOTE  Date:  06/08/2019    Derrick [name] Date of Birth: Jan 25, 1956 Medical Record #[id number]  PCP:  [name], [name]  Cardiologist:  Servando Snare   Chief Complaint  Patient presents with   Follow-up    History of Present Illness: Derrick [name] is a 63 y.o. male who presents today for a follow up visit.  Seen for Dr. [name]. Primarily follows with me.   He has a history of CAD s/p inferior STEMI with DES x 2 to RCA in 2004 and moderate MR with mitral valve prolapse. His other issues include tobacco abuse, bipolar disorder, OSA and HLD. He smokes 2 cigs per day and has done so for many years.   Echo from 11/2015with EF of 45 to 50%, moderateLVH, MVP with mild to moderate MR.  Seen for first time by Dr. [name] back in 2014. Cardiac status appeared stable but was short of breath - ordered a Myoview, PFTs and BNP. BNP was 18. Low risk Myoview noted. PFTs without marked abnormality.   I have seen him back over the past few years - I saw him back in January of 2018 - had had some eye surgery and was told he had a "skip" in his heart. He was asymptomatic. Had been out of his beta blocker for at least 6 months - I restarted it. Got his echo updated - EF down more - reviewed with Dr. [name] - set up for Myoview - larger scar noted. Cardiac cath recommended - ended up having PCI.Plan was to update his echo 3 months post PCI.   I then saw him back in Juneof 2018- some "indigestion" reported - but had used NTG with relief.On follow up back in September he had continued to endorse some chest pain. Had been using NTG. Myoview updated- see below.Ended up getting cardiac MRI as well and EF is 47%.Admits that he could do better with his CV risk factor modification.When seen back in October of 2018 - doing quite well - no chest pain. Had stopped his Lamictal and Lexapro. He was to follow up with psyche and ended up back on his psyche meds. Still smoking about 2  cigs per day.   Last seen back in September - he was doing ok.   The patient does not have symptoms concerning for COVID-19 infection (fever, chills, cough, or new shortness of breath).   Comes back today. Here alone.He is doing well. He just left his counseling session - mostly talked about the situation in the world - has been hard for him - he is not watching TV all day. He has spent lots of time outside. No chest pain. Smoking probably a little more. Not short of breath. Energy is good. Breathing is good.   Past Medical History:  Diagnosis Date   Anxiety    Bipolar 1 disorder ([name])    BPH (benign prostatic hypertrophy)    CAD (coronary artery disease), native coronary artery    a. DES x 2 to RCA in 2004 b. 02/11/17 PCI w/DES x1 to mRCA   Depression    Hyperlipidemia    Mitral regurgitation and aortic stenosis    MVP (mitral valve prolapse)    Myocardial infarction ([place])    OSA (obstructive sleep apnea) 03/22/2013   Sleep apnea     Past Surgical History:  Procedure Laterality Date   CORONARY STENT INTERVENTION N/A 02/12/2017   Procedure: Coronary Stent Intervention;  Surgeon: [name],  MD;  Location: [facility];  Service: Cardiovascular;  Laterality: N/A;   LEFT HEART CATH AND CORONARY ANGIOGRAPHY N/A 02/12/2017   Procedure: Left Heart Cath and Coronary Angiography;  Surgeon: [name], MD;  Location: [facility];  Service: Cardiovascular;  Laterality: N/A;     Medications: Current Meds  Medication Sig   acetaminophen (TYLENOL) 500 MG tablet Take 500 mg by mouth every 6 (six) hours as needed for mild pain.    aspirin 81 MG tablet Take 81 mg by mouth daily.   atorvastatin (LIPITOR) 80 MG tablet TAKE 1 TABLET BY MOUTH DAILY   clonazePAM (KLONOPIN) 1 MG tablet Take 1 mg by mouth as needed for anxiety.   divalproex (DEPAKOTE) 500 MG DR tablet Take 3 tablets (1,500 mg total) by mouth at bedtime.   escitalopram (LEXAPRO) 20 MG tablet TAKE  1 TABLET (20 MG TOTAL) BY MOUTH DAILY.   isosorbide mononitrate (IMDUR) 30 MG 24 hr tablet Take 0.5 tablets (15 mg total) by mouth daily.   lamoTRIgine (LAMICTAL) 100 MG tablet Take 100 mg by mouth at bedtime.   metoprolol tartrate (LOPRESSOR) 25 MG tablet TAKE 1 1/2 TABLETS BY MOUTH TWICE DAILY   montelukast (SINGULAIR) 10 MG tablet Take 1 tablet (10 mg total) by mouth daily.   nitroGLYCERIN (NITROSTAT) 0.4 MG SL tablet Place 1 tablet (0.4 mg total) under the tongue every 5 (five) minutes as needed for chest pain.   pantoprazole (PROTONIX) 40 MG tablet Take 1 tablet (40 mg total) by mouth daily.   zolpidem (AMBIEN CR) 12.5 MG CR tablet Take 1 tablet (12.5 mg total) by mouth at bedtime as needed for sleep.   Current Facility-Administered Medications for the 06/08/19 encounter (Office Visit) with Burtis Junes, NP  Medication   0.9 %  sodium chloride infusion     Allergies: No Known Allergies  Social History: The patient  reports that he quit smoking about 16 years ago. His smoking use included cigarettes. He has never used smokeless tobacco. He reports previous alcohol use. He reports that he does not use drugs.   Family History: The patient's family history includes COPD in his father; Colon cancer in his sister; Heart disease in his brother and mother.   Review of Systems: Please see the history of present illness.   All other systems are reviewed and negative.   Physical Exam: VS:  BP 118/80    Pulse 69    Ht 5\' 11"  (1.803 m)    Wt 184 lb (83.5 kg)    SpO2 96%    BMI 25.66 kg/m  .  BMI Body mass index is 25.66 kg/m.  Wt Readings from Last 3 Encounters:  06/08/19 184 lb (83.5 kg)  12/20/18 191 lb 6.4 oz (86.8 kg)  08/30/18 185 lb 12.8 oz (84.3 kg)    General: Pleasant. Well developed, well nourished and in no acute distress.  He is quite suntanned/burned today.  HEENT: Normal.  Neck: Supple, no JVD, carotid bruits, or masses noted.  Cardiac: Regular rate and  rhythm. No murmurs, rubs, or gallops. No edema.  Respiratory:  Lungs are clear to auscultation bilaterally with normal work of breathing.  GI: Soft and nontender.  MS: No deformity or atrophy. Gait and ROM intact.  Skin: Warm and dry. Color is normal.  Neuro:  Strength and sensation are intact and no gross focal deficits noted.  Psych: Alert, appropriate and with normal affect.   LABORATORY DATA:  EKG:  EKG  is not ordered today.   Lab Results  Component Value Date   WBC 6.4 12/16/2018   HGB 14.3 12/16/2018   HCT 41.8 12/16/2018   PLT 221 12/16/2018   GLUCOSE 92 12/16/2018   CHOL 172 12/20/2018   TRIG 281 (H) 12/20/2018   HDL 32 (L) 12/20/2018   LDLCALC 84 12/20/2018   ALT 45 12/16/2018   AST 27 12/16/2018   NA 140 12/16/2018   K 4.3 12/16/2018   CL 104 12/16/2018   CREATININE 0.81 12/16/2018   BUN 11 12/16/2018   CO2 27 12/16/2018   TSH 3.360 07/19/2014   INR 1.0 02/09/2017     BNP (last 3 results) No results for input(s): BNP in the last 8760 hours.  ProBNP (last 3 results) No results for input(s): PROBNP in the last 8760 hours.   Other Studies Reviewed Today:  CARDIAC MRIIMPRESSION11/2018: 1. Normal LV size with wall motion abnormalities noted above. EF 47%.Delayed enhancement images suggestive of prior infarction in the RCA territory.  2. Normal RV size and systolic function.  3. Mitral valve prolapse with mild to moderate mitral regurgitation.  Electronically Signed By: [name] M.D. On: 10/23/2017 16:43  MyoviewStudy Highlights9/2018   Nuclear stress EF: 37%.  Blood pressure demonstrated a normal response to exercise.  There was no ST segment deviation noted during stress.  No T wave inversion was noted during stress.  Defect 1: There is a small defect of moderate severity present in the basal inferior and mid inferior location.  Findings consistent with prior myocardial infarction. No ischemia.  The left ventricular  ejection fraction is moderately decreased (30-44%).    Notes recorded by Burtis Junes, NP on 09/01/2017 at 7:51 AM EDT Ok to report. Dr. [name] has reviewed his Myoview - EF while low - same as prior study - would like to see back in about a month to recheck him. May consider cardiac MRI to get a better sense of his pumping function. For now, stay on current regimen as outlined at his visit.   Echo Study Conclusions 05/2017  - Left ventricle: The cavity size was normal. Wall thickness was normal. The estimated ejection fraction was 50%. Diffuse hypokinesis. Doppler parameters are consistent with abnormal left ventricular relaxation (grade 1 diastolic dysfunction). - Aortic valve: There was no stenosis. - Mitral valve: Mildly calcified annulus. Bileaflet mitral valve prolapse. There was mild to moderate late systolic regurgitation. - Right ventricle: The cavity size was normal. Systolic function was normal. - Pulmonary arteries: No complete TR doppler jet so unable to estimate PA systolic pressure. - Inferior vena cava: The vessel was normal in size. The respirophasic diameter changes were in the normal range (>= 50%), consistent with normal central venous pressure.  Impressions:  - Normal LV size with EF 50%, mild diffuse hypokinesis. Normal RV size and systolic function. Bileaflet mitral valve prolapse with mild to moderate late systolic mitral regurgitation.   Coronary Stent Intervention 02/2017  Conclusion     Mid RCA lesion, 90 %stenosed.  A STENT RESOLUTE ONYX 4.0X12 drug eluting stent was successfully placed.  Prox RCA to Mid RCA lesion, 90 %stenosed.  Post intervention, there is a 0% residual stenosis.  Successful stenting of the mid RCA with DES for in stent restenosis.  Plan: DAPT for one year with ASA and Plavix. May be suitable for same day discharge.      Assessment/Plan:  1. CAD - s/p PCI to the RCA March of 2018 -  off of DAPT.  No ischemia from Myoview from 08/2017. He never had chest pain prior to his PCI - he has been managed medically. No active symptoms noted - actually doing very well - no changes made today. Refills sent in today. Lab today.     2. LV dysfunction - EF is 47% by cardiac MRI - on beta blocker.  He continues to do well clinically.    3. Tobacco abuse- smoking a little more - total cessation encouraged.   4. HTN - BP is fine - no changes made.   5. HLD - lab today - remains on statin  6. Mitral regurgitation - mild to moderate MR from last echo - still no real murmur that I appreciate on exam today. Will follow. Consider recheck on return.   7. Psyche disorder - he seems to be back on his medicines. He is followed by [facility].He had what sounds like a good counseling session.   8. COVID-19 Education:  The signs and symptoms of COVID-19 were discussed with the patient and how to seek care for testing (follow up with PCP or arrange E-visit).  The importance of social distancing, staying at home, hand hygiene and wearing a mask when out in public were discussed today.  Current medicines are reviewed with the patient today.  The patient does not have concerns regarding medicines other than what has been noted above.  The following changes have been made:  See above.  Labs/ tests ordered today include:   No orders of the defined types were placed in this encounter.    Disposition:   FU with me in 6 months.   Patient is agreeable to this plan and will call if any problems develop in the interim.   SignedTruitt Merle, NP  06/08/2019 3:56 PM  [facility] [address] [place], [place]  [address] Phone: [phone] Fax: [phone]

## 2019-06-08 ENCOUNTER — Ambulatory Visit (INDEPENDENT_AMBULATORY_CARE_PROVIDER_SITE_OTHER): Payer: Medicare HMO | Admitting: Psychiatry

## 2019-06-08 ENCOUNTER — Ambulatory Visit (INDEPENDENT_AMBULATORY_CARE_PROVIDER_SITE_OTHER): Payer: Medicare HMO | Admitting: Nurse Practitioner

## 2019-06-08 ENCOUNTER — Other Ambulatory Visit: Payer: Self-pay

## 2019-06-08 ENCOUNTER — Encounter: Payer: Self-pay | Admitting: Nurse Practitioner

## 2019-06-08 VITALS — BP 118/80 | HR 69 | Ht 71.0 in | Wt 184.0 lb

## 2019-06-08 DIAGNOSIS — I5022 Chronic systolic (congestive) heart failure: Secondary | ICD-10-CM

## 2019-06-08 DIAGNOSIS — F319 Bipolar disorder, unspecified: Secondary | ICD-10-CM | POA: Diagnosis not present

## 2019-06-08 DIAGNOSIS — I259 Chronic ischemic heart disease, unspecified: Secondary | ICD-10-CM | POA: Diagnosis not present

## 2019-06-08 DIAGNOSIS — I340 Nonrheumatic mitral (valve) insufficiency: Secondary | ICD-10-CM

## 2019-06-08 DIAGNOSIS — Z87898 Personal history of other specified conditions: Secondary | ICD-10-CM | POA: Diagnosis not present

## 2019-06-08 DIAGNOSIS — E7800 Pure hypercholesterolemia, unspecified: Secondary | ICD-10-CM

## 2019-06-08 DIAGNOSIS — F600 Paranoid personality disorder: Secondary | ICD-10-CM | POA: Diagnosis not present

## 2019-06-08 DIAGNOSIS — F902 Attention-deficit hyperactivity disorder, combined type: Secondary | ICD-10-CM

## 2019-06-08 DIAGNOSIS — F411 Generalized anxiety disorder: Secondary | ICD-10-CM

## 2019-06-08 DIAGNOSIS — G479 Sleep disorder, unspecified: Secondary | ICD-10-CM

## 2019-06-08 DIAGNOSIS — R419 Unspecified symptoms and signs involving cognitive functions and awareness: Secondary | ICD-10-CM | POA: Diagnosis not present

## 2019-06-08 DIAGNOSIS — F1991 Other psychoactive substance use, unspecified, in remission: Secondary | ICD-10-CM

## 2019-06-08 MED ORDER — ISOSORBIDE MONONITRATE ER 30 MG PO TB24: 15.0000 mg | ORAL_TABLET | Freq: Every day | ORAL | 3 refills | Status: DC

## 2019-06-08 MED ORDER — PANTOPRAZOLE SODIUM 40 MG PO TBEC: 40.0000 mg | DELAYED_RELEASE_TABLET | Freq: Every day | ORAL | 3 refills | Status: DC

## 2019-06-08 NOTE — Patient Instructions (Addendum)
After Visit Summary:  We will be checking the following labs today - BMET, CBC, HPF and Lipids   Medication Instructions:    Continue with your current medicines.   I sent in your refills today for the Imdur and Protonix   If you need a refill on your cardiac medications before your next appointment, please call your pharmacy.     Testing/Procedures To Be Arranged:  N/A  Follow-Up:   See me in 6 months    At [facility], you and your health needs are our priority.  As part of our continuing mission to provide you with exceptional heart care, we have created designated Provider Care Teams.  These Care Teams include your primary Cardiologist (physician) and Advanced Practice Providers (APPs -  Physician Assistants and Nurse Practitioners) who all work together to provide you with the care you need, when you need it.  Special Instructions:  . Stay safe, stay home, wash your hands for at least 20 seconds and wear a mask when out in public.  . It was good to talk with you today.    Call the [place] office at [phone] if you have any questions, problems or concerns.
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
## 2019-06-09 LAB — CBC
Hematocrit: 40.7 % (ref 37.5–51.0)
Hemoglobin: 14.4 g/dL (ref 13.0–17.7)
MCH: 32 pg (ref 26.6–33.0)
MCHC: 35.4 g/dL (ref 31.5–35.7)
MCV: 90 fL (ref 79–97)
Platelets: 208 10*3/uL (ref 150–450)
RBC: 4.5 x10E6/uL (ref 4.14–5.80)
RDW: 13 % (ref 11.6–15.4)
WBC: 6.1 10*3/uL (ref 3.4–10.8)

## 2019-06-09 LAB — HEPATIC FUNCTION PANEL
ALT: 24 IU/L (ref 0–44)
AST: 18 IU/L (ref 0–40)
Albumin: 4.7 g/dL (ref 3.8–4.8)
Alkaline Phosphatase: 71 IU/L (ref 39–117)
Bilirubin Total: 0.8 mg/dL (ref 0.0–1.2)
Bilirubin, Direct: 0.14 mg/dL (ref 0.00–0.40)
Total Protein: 6.6 g/dL (ref 6.0–8.5)

## 2019-06-09 LAB — BASIC METABOLIC PANEL
BUN/Creatinine Ratio: 13 (ref 10–24)
BUN: 11 mg/dL (ref 8–27)
CO2: 24 mmol/L (ref 20–29)
Calcium: 9.6 mg/dL (ref 8.6–10.2)
Chloride: 101 mmol/L (ref 96–106)
Creatinine, Ser: 0.83 mg/dL (ref 0.76–1.27)
GFR calc Af Amer: 110 mL/min/{1.73_m2} (ref >59)
GFR calc non Af Amer: 95 mL/min/{1.73_m2} (ref >59)
Glucose: 85 mg/dL (ref 65–99)
Potassium: 4.4 mmol/L (ref 3.5–5.2)
Sodium: 139 mmol/L (ref 134–144)

## 2019-06-09 LAB — LIPID PANEL
Chol/HDL Ratio: 5.1 ratio — ABNORMAL HIGH (ref 0.0–5.0)
Cholesterol, Total: 172 mg/dL (ref 100–199)
HDL: 34 mg/dL — ABNORMAL LOW (ref >39)
LDL Calculated: 100 mg/dL — ABNORMAL HIGH (ref 0–99)
Triglycerides: 191 mg/dL — ABNORMAL HIGH (ref 0–149)
VLDL Cholesterol Cal: 38 mg/dL (ref 5–40)

## 2019-07-04 ENCOUNTER — Ambulatory Visit: Payer: Medicare HMO | Admitting: Psychiatry

## 2019-07-04 ENCOUNTER — Encounter: Payer: Self-pay | Admitting: Psychiatry

## 2019-07-04 NOTE — Progress Notes (Signed)
Admin note for No-show or Short Notice Cancellation  Patient ID: Derrick [name]  MRN: [id number] DATE: 07/04/2019  No show for 1pm psychotherapy today.  Charge normally per policy.  No safety risks suggested or presumed.  Staff requested to inquire with PT about rescheduling.  [name], PhD

## 2019-07-07 ENCOUNTER — Ambulatory Visit: Payer: Medicare HMO | Admitting: Physician Assistant

## 2019-07-12 ENCOUNTER — Telehealth: Payer: Self-pay | Admitting: Physician Assistant

## 2019-07-12 NOTE — Chronic Care Management (AMB) (Signed)
°  Chronic Care Management   Outreach Note  07/12/2019 Name: Derrick [name] MRN: [id number] DOB: 01-15-1956  Referred by: [name], [name] Reason for referral : Chronic Care Management (Third CCM outreach was unsuccessful. )   Third unsuccessful telephone outreach was attempted today. The patient was referred to the case management team for assistance with chronic care management and care coordination. The patient's primary care provider has been notified of our unsuccessful attempts to make or maintain contact with the patient. The care management team is pleased to engage with this patient at any time in the future should he/she be interested in assistance from the care management team.   Follow Up Plan: The care management team is available to follow up with the patient after provider conversation with the patient regarding recommendation for care management engagement and subsequent re-referral to the care management team.   [place]  [email or web address]   ??[phone]

## 2019-07-18 ENCOUNTER — Other Ambulatory Visit: Payer: Self-pay

## 2019-07-18 ENCOUNTER — Ambulatory Visit (INDEPENDENT_AMBULATORY_CARE_PROVIDER_SITE_OTHER): Payer: Medicare HMO | Admitting: Psychiatry

## 2019-07-18 DIAGNOSIS — F600 Paranoid personality disorder: Secondary | ICD-10-CM | POA: Diagnosis not present

## 2019-07-18 DIAGNOSIS — F319 Bipolar disorder, unspecified: Secondary | ICD-10-CM | POA: Diagnosis not present

## 2019-07-18 DIAGNOSIS — F1991 Other psychoactive substance use, unspecified, in remission: Secondary | ICD-10-CM

## 2019-07-18 DIAGNOSIS — F902 Attention-deficit hyperactivity disorder, combined type: Secondary | ICD-10-CM

## 2019-07-18 DIAGNOSIS — F068 Other specified mental disorders due to known physiological condition: Secondary | ICD-10-CM

## 2019-07-18 DIAGNOSIS — Z87898 Personal history of other specified conditions: Secondary | ICD-10-CM
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
## 2019-08-18 ENCOUNTER — Ambulatory Visit: Payer: Medicare HMO | Admitting: Psychiatry

## 2019-08-24 ENCOUNTER — Other Ambulatory Visit: Payer: Self-pay | Admitting: Nurse Practitioner

## 2019-09-02 ENCOUNTER — Other Ambulatory Visit: Payer: Self-pay

## 2019-09-02 ENCOUNTER — Ambulatory Visit (INDEPENDENT_AMBULATORY_CARE_PROVIDER_SITE_OTHER): Payer: Medicare HMO | Admitting: Psychiatry

## 2019-09-02 DIAGNOSIS — F068 Other specified mental disorders due to known physiological condition: Secondary | ICD-10-CM | POA: Diagnosis not present

## 2019-09-02 DIAGNOSIS — F319 Bipolar disorder, unspecified: Secondary | ICD-10-CM

## 2019-09-02 DIAGNOSIS — F600 Paranoid personality disorder: Secondary | ICD-10-CM | POA: Diagnosis not present

## 2019-09-02 DIAGNOSIS — F902 Attention-deficit hyperactivity disorder, combined type: Secondary | ICD-10-CM

## 2019-09-02 DIAGNOSIS — Z87898 Personal history of other specified conditions: Secondary | ICD-10-CM | POA: Diagnosis not present

## 2019-09-02 DIAGNOSIS — F1011 Alcohol abuse, in remission: Secondary | ICD-10-CM | POA: Diagnosis not present

## 2019-09-02 DIAGNOSIS — F1991 Other psychoactive substance use, unspecified, in remission: Secondary | ICD-10-CM

## 2019-09-02 DIAGNOSIS — F411 Generalized anxiety disorder: Secondary | ICD-10-CM
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
## 2019-09-03 ENCOUNTER — Other Ambulatory Visit: Payer: Self-pay | Admitting: Physician Assistant

## 2019-09-05 ENCOUNTER — Other Ambulatory Visit: Payer: Self-pay

## 2019-09-05 NOTE — Telephone Encounter (Signed)
Last appt 03/2019 due back in July

## 2019-09-06 ENCOUNTER — Ambulatory Visit (INDEPENDENT_AMBULATORY_CARE_PROVIDER_SITE_OTHER): Payer: Medicare HMO | Admitting: Physician Assistant

## 2019-09-06 ENCOUNTER — Other Ambulatory Visit: Payer: Self-pay

## 2019-09-06 ENCOUNTER — Encounter: Payer: Self-pay | Admitting: Physician Assistant

## 2019-09-06 VITALS — BP 118/81 | HR 74 | Temp 96.8°F | Ht 71.0 in | Wt 177.4 lb

## 2019-09-06 DIAGNOSIS — N3001 Acute cystitis with hematuria: Secondary | ICD-10-CM

## 2019-09-06 DIAGNOSIS — R350 Frequency of micturition: Secondary | ICD-10-CM

## 2019-09-06 LAB — MICROSCOPIC EXAMINATION
Renal Epithel, UA: NONE SEEN /hpf
WBC, UA: NONE SEEN /hpf (ref 0–5)

## 2019-09-06 LAB — URINALYSIS, COMPLETE
Bilirubin, UA: NEGATIVE
Glucose, UA: NEGATIVE
Ketones, UA: NEGATIVE
Leukocytes,UA: NEGATIVE
Nitrite, UA: NEGATIVE
Protein,UA: NEGATIVE
Specific Gravity, UA: 1.02 (ref 1.005–1.030)
Urobilinogen, Ur: 0.2 mg/dL (ref 0.2–1.0)
pH, UA: 7.5 (ref 5.0–7.5)

## 2019-09-06 MED ORDER — TAMSULOSIN HCL 0.4 MG PO CAPS: 0.4000 mg | ORAL_CAPSULE | Freq: Every day | ORAL | 0 refills | Status: DC

## 2019-09-06 MED ORDER — SULFAMETHOXAZOLE-TRIMETHOPRIM 800-160 MG PO TABS: 1.0000 | ORAL_TABLET | Freq: Two times a day (BID) | ORAL | 0 refills | Status: DC

## 2019-09-06 NOTE — Patient Instructions (Signed)
Urinary Tract Infection, Adult A urinary tract infection (UTI) is an infection of any part of the urinary tract. The urinary tract includes:  The kidneys.  The ureters.  The bladder.  The urethra. These organs make, store, and get rid of pee (urine) in the body. What are the causes? This is caused by germs (bacteria) in your genital area. These germs grow and cause swelling (inflammation) of your urinary tract. What increases the risk? You are more likely to develop this condition if:  You have a small, thin tube (catheter) to drain pee.  You cannot control when you pee or poop (incontinence).  You are male, and: ? You use these methods to prevent pregnancy: ? A medicine that kills sperm (spermicide). ? A device that blocks sperm (diaphragm). ? You have low levels of a male hormone (estrogen). ? You are pregnant.  You have genes that add to your risk.  You are sexually active.  You take antibiotic medicines.  You have trouble peeing because of: ? A prostate that is bigger than normal, if you are male. ? A blockage in the part of your body that drains pee from the bladder (urethra). ? A kidney stone. ? A nerve condition that affects your bladder (neurogenic bladder). ? Not getting enough to drink. ? Not peeing often enough.  You have other conditions, such as: ? Diabetes. ? A weak disease-fighting system (immune system). ? Sickle cell disease. ? Gout. ? Injury of the spine. What are the signs or symptoms? Symptoms of this condition include:  Needing to pee right away (urgently).  Peeing often.  Peeing small amounts often.  Pain or burning when peeing.  Blood in the pee.  Pee that smells bad or not like normal.  Trouble peeing.  Pee that is cloudy.  Fluid coming from the vagina, if you are male.  Pain in the belly or lower back. Other symptoms include:  Throwing up (vomiting).  No urge to eat.  Feeling mixed up (confused).  Being tired  and grouchy (irritable).  A fever.  Watery poop (diarrhea). How is this treated? This condition may be treated with:  Antibiotic medicine.  Other medicines.  Drinking enough water. Follow these instructions at home:  Medicines  Take over-the-counter and prescription medicines only as told by your doctor.  If you were prescribed an antibiotic medicine, take it as told by your doctor. Do not stop taking it even if you start to feel better. General instructions  Make sure you: ? Pee until your bladder is empty. ? Do not hold pee for a long time. ? Empty your bladder after sex. ? Wipe from front to back after pooping if you are a male. Use each tissue one time when you wipe.  Drink enough fluid to keep your pee pale yellow.  Keep all follow-up visits as told by your doctor. This is important. Contact a doctor if:  You do not get better after 1-2 days.  Your symptoms go away and then come back. Get help right away if:  You have very bad back pain.  You have very bad pain in your lower belly.  You have a fever.  You are sick to your stomach (nauseous).  You are throwing up. Summary  A urinary tract infection (UTI) is an infection of any part of the urinary tract.  This condition is caused by germs in your genital area.  There are many risk factors for a UTI. These include having a small, thin   tube to drain pee and not being able to control when you pee or poop.  Treatment includes antibiotic medicines for germs.  Drink enough fluid to keep your pee pale yellow. This information is not intended to replace advice given to you by your health care provider. Make sure you discuss any questions you have with your health care provider. Document Released: 05/19/2008 Document Revised: 11/18/2018 Document Reviewed: 06/10/2018 Elsevier Patient Education  2020 Elsevier Inc.  

## 2019-09-07 ENCOUNTER — Telehealth: Payer: Self-pay | Admitting: Physician Assistant

## 2019-09-07 LAB — URINE CULTURE: Organism ID, Bacteria: NO GROWTH

## 2019-09-08 NOTE — Progress Notes (Signed)
BP 118/81   Pulse 74   Temp (!) 96.8 F (36 C) (Temporal)   Ht 5\' 11"  (1.803 m)   Wt 177 lb 6.4 oz (80.5 kg)   SpO2 98%   BMI 24.74 kg/m    Subjective:    Patient ID: Derrick [name], male    DOB: 02/23/1956, 63 y.o.   MRN: [id number]  HPI: Derrick [name] is a 63 y.o. male presenting on 09/06/2019 for Back Pain (low ) and Urinary Frequency  This patient has had several days of dysuria, frequency and nocturia. There is also pain over the bladder in the suprapubic region, no back pain. Denies leakage or hematuria.  Denies fever or chills. No pain in flank area.   Past Medical History:  Diagnosis Date  . Anxiety   . Bipolar 1 disorder ([place])   . BPH (benign prostatic hypertrophy)   . CAD (coronary artery disease), native coronary artery    a. DES x 2 to RCA in 2004 b. 02/11/17 PCI w/DES x1 to mRCA  . Depression   . Hyperlipidemia   . Mitral regurgitation and aortic stenosis   . MVP (mitral valve prolapse)   . Myocardial infarction ([place])   . OSA (obstructive sleep apnea) 03/22/2013  . Sleep apnea    Relevant past medical, surgical, family and social history reviewed and updated as indicated. Interim medical history since our last visit reviewed. Allergies and medications reviewed and updated. DATA REVIEWED: CHART IN EPIC  Family History reviewed for pertinent findings.  Review of Systems  Constitutional: Negative.  Negative for appetite change and fatigue.  Eyes: Negative for pain and visual disturbance.  Respiratory: Negative.  Negative for cough, chest tightness, shortness of breath and wheezing.   Cardiovascular: Negative.  Negative for chest pain, palpitations and leg swelling.  Gastrointestinal: Negative.  Negative for abdominal pain, diarrhea, nausea and vomiting.  Genitourinary: Positive for difficulty urinating, dysuria and frequency. Negative for hematuria.  Skin: Negative.  Negative for color change and rash.  Neurological: Negative.  Negative for weakness, numbness  and headaches.  Psychiatric/Behavioral: Negative.     Allergies as of 09/06/2019   No Known Allergies     Medication List       Accurate as of September 06, 2019 11:59 PM. If you have any questions, ask your nurse or doctor.        acetaminophen 500 MG tablet Commonly known as: TYLENOL Take 500 mg by mouth every 6 (six) hours as needed for mild pain.   aspirin 81 MG tablet Take 81 mg by mouth daily.   atorvastatin 80 MG tablet Commonly known as: LIPITOR TAKE 1 TABLET BY MOUTH DAILY   clonazePAM 1 MG tablet Commonly known as: KLONOPIN Take 1 mg by mouth as needed for anxiety.   divalproex 500 MG DR tablet Commonly known as: DEPAKOTE TAKE 3 TABLETS (1,500 MG TOTAL) BY MOUTH AT BEDTIME.   escitalopram 20 MG tablet Commonly known as: LEXAPRO TAKE 1 TABLET (20 MG TOTAL) BY MOUTH DAILY.   isosorbide mononitrate 30 MG 24 hr tablet Commonly known as: IMDUR Take 0.5 tablets (15 mg total) by mouth daily.   lamoTRIgine 100 MG tablet Commonly known as: LAMICTAL Take 100 mg by mouth at bedtime.   metoprolol tartrate 25 MG tablet Commonly known as: LOPRESSOR TAKE 1 AND 1/2 TABLETS TWICE DAILY   montelukast 10 MG tablet Commonly known as: SINGULAIR Take 1 tablet (10 mg total) by mouth daily.   nitroGLYCERIN 0.4 MG SL tablet  Commonly known as: NITROSTAT Place 1 tablet (0.4 mg total) under the tongue every 5 (five) minutes as needed for chest pain.   pantoprazole 40 MG tablet Commonly known as: PROTONIX Take 1 tablet (40 mg total) by mouth daily.   sulfamethoxazole-trimethoprim 800-160 MG tablet Commonly known as: Bactrim DS Take 1 tablet by mouth 2 (two) times daily. Started by: [name], [name]   tamsulosin 0.4 MG Caps capsule Commonly known as: FLOMAX Take 1 capsule (0.4 mg total) by mouth daily. For flow Started by: [name], [name]   zolpidem 12.5 MG CR tablet Commonly known as: Ambien CR Take 1 tablet (12.5 mg total) by mouth at bedtime as needed  for sleep.          Objective:    BP 118/81   Pulse 74   Temp (!) 96.8 F (36 C) (Temporal)   Ht 5\' 11"  (1.803 m)   Wt 177 lb 6.4 oz (80.5 kg)   SpO2 98%   BMI 24.74 kg/m   No Known Allergies  Wt Readings from Last 3 Encounters:  09/06/19 177 lb 6.4 oz (80.5 kg)  06/08/19 184 lb (83.5 kg)  12/20/18 191 lb 6.4 oz (86.8 kg)    Physical Exam Vitals signs and nursing note reviewed.  Constitutional:      General: He is not in acute distress.    Appearance: He is well-developed.  HENT:     Head: Normocephalic and atraumatic.  Eyes:     Conjunctiva/sclera: Conjunctivae normal.     Pupils: Pupils are equal, round, and reactive to light.  Cardiovascular:     Rate and Rhythm: Normal rate and regular rhythm.     Heart sounds: Normal heart sounds.  Pulmonary:     Effort: Pulmonary effort is normal. No respiratory distress.     Breath sounds: Normal breath sounds.  Abdominal:     Tenderness: There is abdominal tenderness in the suprapubic area.  Skin:    General: Skin is warm and dry.  Psychiatric:        Behavior: Behavior normal.     Results for orders placed or performed in visit on 09/06/19  Urine Culture   Specimen: Urine   UR  Result Value Ref Range   Urine Culture, Routine Final report    Organism ID, Bacteria No growth   Microscopic Examination   URINE  Result Value Ref Range   WBC, UA None seen 0 - 5 /hpf   RBC 0-2 0 - 2 /hpf   Epithelial Cells (non renal) 0-10 0 - 10 /hpf   Renal Epithel, UA None seen None seen /hpf   Bacteria, UA Few None seen/Few  Urinalysis, Complete  Result Value Ref Range   Specific Gravity, UA 1.020 1.005 - 1.030   pH, UA 7.5 5.0 - 7.5   Color, UA Yellow Yellow   Appearance Ur Clear Clear   Leukocytes,UA Negative Negative   Protein,UA Negative Negative/Trace   Glucose, UA Negative Negative   Ketones, UA Negative Negative   RBC, UA Trace (A) Negative   Bilirubin, UA Negative Negative   Urobilinogen, Ur 0.2 0.2 - 1.0 mg/dL    Nitrite, UA Negative Negative   Microscopic Examination See below:       Assessment & Plan:   1. Frequent urination - Urine Culture - Urinalysis, Complete - Microscopic Examination  2. Acute cystitis with hematuria - tamsulosin (FLOMAX) 0.4 MG CAPS capsule; Take 1 capsule (0.4 mg total) by mouth daily. For flow  Dispense: 30 capsule; Refill: 0 - sulfamethoxazole-trimethoprim (BACTRIM DS) 800-160 MG tablet; Take 1 tablet by mouth 2 (two) times daily.  Dispense: 20 tablet; Refill: 0 - Microscopic Examination   Continue all other maintenance medications as listed above.  Follow up plan: Return if symptoms worsen or fail to improve.  Educational handout given for UTI  [name] [name] [place] [address]  [place], [place] [address] [phone]   09/08/2019, 10:12 AM

## 2019-09-20 ENCOUNTER — Other Ambulatory Visit: Payer: Self-pay

## 2019-09-20 ENCOUNTER — Ambulatory Visit (INDEPENDENT_AMBULATORY_CARE_PROVIDER_SITE_OTHER): Payer: Medicare HMO

## 2019-09-20 DIAGNOSIS — Z23 Encounter for immunization: Secondary | ICD-10-CM | POA: Diagnosis not present

## 2019-09-26 ENCOUNTER — Other Ambulatory Visit: Payer: Self-pay | Admitting: Nurse Practitioner

## 2019-09-29 ENCOUNTER — Other Ambulatory Visit: Payer: Self-pay | Admitting: Physician Assistant

## 2019-09-29 DIAGNOSIS — N3001 Acute cystitis with hematuria: Secondary | ICD-10-CM

## 2019-10-05 ENCOUNTER — Other Ambulatory Visit: Payer: Self-pay

## 2019-10-05 ENCOUNTER — Ambulatory Visit (INDEPENDENT_AMBULATORY_CARE_PROVIDER_SITE_OTHER): Payer: Medicare HMO | Admitting: Psychiatry

## 2019-10-05 DIAGNOSIS — F600 Paranoid personality disorder: Secondary | ICD-10-CM

## 2019-10-05 DIAGNOSIS — Z87898 Personal history of other specified conditions: Secondary | ICD-10-CM | POA: Diagnosis not present

## 2019-10-05 DIAGNOSIS — F1011 Alcohol abuse, in remission: Secondary | ICD-10-CM

## 2019-10-05 DIAGNOSIS — F902 Attention-deficit hyperactivity disorder, combined type: Secondary | ICD-10-CM | POA: Diagnosis not present

## 2019-10-05 DIAGNOSIS — F319 Bipolar disorder, unspecified: Secondary | ICD-10-CM

## 2019-10-05 DIAGNOSIS — F1991 Other psychoactive substance use, unspecified, in remission: Secondary | ICD-10-CM

## 2019-10-05 DIAGNOSIS — F068 Other specified mental disorders due to known physiological condition: Secondary | ICD-10-CM

## 2019-10-05 NOTE — Progress Notes (Signed)
Psychotherapy Progress Note [facility] Derrick Moore, PhD LP  Patient ID: Derrick [name]     MRN: [id number]     Therapy format: Individual psychotherapy Date: 10/05/2019     Start: 1:13p Stop: 1:59p Time Spent: 46 min Location: in-person   Session narrative (presenting needs, interim history, self-report of stressors and symptoms, applications of prior therapy, status changes, and interventions made in session) [name] getting worse with anorexia, both PT and W can hardly look at her, partly for the resemblance to [place] when she was dying.  PT warming up to tell her she's going to make him start drinking again.  Clarified with him to be ready to tell [name] that what she's doing compares to how it would be if he did, don't threaten particularly to engage in destructive behavior, even if tempted.  Endorsed being ready to tell her she is looking sick, like her sister, and at most that he is tempted.  Advocated collaborating with wife and son-in-law Herbie Baltimore as much as possible on an intervention.    Secondary concern youngest grandchild not being given enough to eat.  Third concern that W is trying to arrange to go to the beach for Thanksgiving, on grounds that [place] won't eat anyway.  Reiterated general advice on preventive measures for pandemic.  Therapeutic modalities: Cognitive Behavioral Therapy and Solution-Oriented/Positive Psychology  Mental Status/Observations:  Appearance:   Casual     Behavior:  Appropriate and Agitated  Motor:  Restlestness  Speech/Language:   Clear and Coherent  Affect:  Appropriate  Mood:  irritable  Thought process:  concrete  Thought content:    WNL  Sensory/Perceptual disturbances:    WNL  Orientation:  grossly intact  Attention:  Good  Concentration:  Fair  Memory:  grossly intact  Insight:    Fair  Judgment:   Fair  Impulse Control:  Fair   Risk Assessment: Danger to Self: No Self-injurious Behavior: No Danger to Others:  No Physical Aggression / Violence: No Duty to Warn: No Access to Firearms a concern: No  Assessment of progress:  stabilized  Diagnosis:   ICD-10-CM   1. Bipolar I disorder ([place])  F31.9   2. Attention deficit hyperactivity disorder (ADHD), combined type  F90.2   3. Cognitive dysfunction in bipolar disorder ([place])  F06.8    F31.9   4. 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 History of drug use disorder  Z87.898   6. History of alcohol abuse  F10.11     Plan:  . Confer with family about confronting [name], follow framework recommended . Other recommendations/advice as noted above . Continue efforts to regulate sleep, circadian rhythm . Maintain abstinence from substance abuse . Continue to utilize previously learned skills ad lib . Maintain medication as prescribed and work faithfully with relevant prescriber(s) if any changes are desired or seem indicated . Call the clinic on-call service, present to ER, or call 911 if any life-threatening psychiatric crisis Return in about 1 month (around 11/05/2019).  [name], PhD Derrick Moore, PhD LP Clinical Psychologist, [facility] [address] [place], [place] [address] [phone]

## 2019-11-03 ENCOUNTER — Other Ambulatory Visit: Payer: Self-pay | Admitting: Physician Assistant

## 2019-11-04 ENCOUNTER — Other Ambulatory Visit: Payer: Self-pay

## 2019-11-04 ENCOUNTER — Ambulatory Visit (INDEPENDENT_AMBULATORY_CARE_PROVIDER_SITE_OTHER): Payer: Medicare HMO | Admitting: Psychiatry

## 2019-11-04 DIAGNOSIS — F068 Other specified mental disorders due to known physiological condition: Secondary | ICD-10-CM | POA: Diagnosis not present

## 2019-11-04 DIAGNOSIS — F411 Generalized anxiety disorder: Secondary | ICD-10-CM

## 2019-11-04 DIAGNOSIS — Z636 Dependent relative needing care at home: Secondary | ICD-10-CM | POA: Diagnosis not present

## 2019-11-04 DIAGNOSIS — F319 Bipolar disorder, unspecified: Secondary | ICD-10-CM

## 2019-11-04 DIAGNOSIS — F902 Attention-deficit hyperactivity disorder, combined type: Secondary | ICD-10-CM | POA: Diagnosis not present

## 2019-11-04 NOTE — Progress Notes (Signed)
Psychotherapy Progress Note [facility] [name], PhD LP  Patient ID: HANI BLADOW     MRN: [id number]     Therapy format: Individual psychotherapy Date: 11/04/2019     Start: 2:04p Stop: 2:53p Time Spent: 49 min Location: in-person   Session narrative (presenting needs, interim history, self-report of stressors and symptoms, applications of prior therapy, status changes, and interventions made in session) W has talked further with [name] about concerns for her and relapse of anorexia.  Allegedly will see a nutritionist, but knows he can be very stubborn.  Lyndee Leo has gotten into it more, knows she has faced her with how she looks like her sister, who dies of cancer.  PT ready to tell her she;s supposed to outlive them, not the other way around.  Will see D tomorrow.  Coached in bringing up how she's had it better, just a year ago, in fact    New situation with [name] in that her H Phong Isenberg's nephew, a violent, autistic child who went into residential care at 45, just discovered dead at 37.  Wondering what to say to Herbie Baltimore, her H.  Suggested just empathize -- "I know it hurts".  Has another compassionate situation with his own nephew (son of much older sister), who's been like a cousin or brother to him, may have sepsis, and kidney failure.    Concerns for political hatred, racial tensions, and COVID-risky behavior of others.  Discussed understandings and tactics.  Therapeutic modalities: Cognitive Behavioral Therapy and Assertiveness/Communication  Mental Status/Observations:  Appearance:   Casual     Behavior:  Appropriate  Motor:  Normal, fidgeting  Speech/Language:   fluent, some run-on, impresisonistic speech  Affect:  Appropriate  Mood:  irritable, more moderate  Thought process:  mild flight of ideas  Thought content:    WNL  Sensory/Perceptual disturbances:    WNL  Orientation:  Fully oriented  Attention:  Good  Concentration:  Fair  Memory:   baseline limits in working memory  Insight:    Fair  Judgment:   Fair  Impulse Control:  Fair   Risk Assessment: Danger to Self: No Self-injurious Behavior: No Danger to Others: No Physical Aggression / Violence: No Duty to Warn: No Access to Firearms a concern: No, has weapon but clear on use, safety  Assessment of progress:  progressing  Diagnosis:   ICD-10-CM   1. Bipolar I disorder ([place])  F31.9   2. Caregiver stress  Z63.6   3. Attention deficit hyperactivity disorder (ADHD), combined type  F90.2   4. Generalized anxiety disorder  F41.1   5. Cognitive dysfunction in bipolar disorder ([place])  F06.8    F31.9     Plan:  [name] Apply ideas for recruiting [name] to recovery . Open offer to include wife as interested . Other recommendations/advice as noted above . Continue to utilize previously learned skills ad lib . Maintain medication as prescribed and work faithfully with relevant prescriber(s) if any changes are desired or seem indicated . Call the clinic on-call service, present to ER, or call 911 if any life-threatening psychiatric crisis Return in about 1 month (around 12/04/2019). Current [facility] appointments: Future Appointments  Date Time Provider [place]  11/29/2019  3:30 PM Burtis Junes, NP [facility]  12/05/2019 11:00 AM [name], PhD CP-CP None    [name], PhD [name], PhD LP Clinical Psychologist, [facility] [address] [place], [place]  [address] (o) [phone]

## 2019-11-26 NOTE — Progress Notes (Signed)
CARDIOLOGY OFFICE NOTE  Date:  11/29/2019    Derrick [name] Date of Birth: 1956/06/18 Medical Record [id number]  PCP:  [name], [name]  Cardiologist:  Servando Snare    Chief Complaint  Patient presents with  . Follow-up    History of Present Illness: Derrick [name] is a 63 y.o. male who presents today for a 6 month check.  Seen for Dr. [name]. Primarily follows with me.   He has a history of CAD s/p inferior STEMI with DES x 2 to RCA in 2004 and moderate MR with mitral valve prolapse. His other issues include tobacco abuse, bipolar disorder, OSA and HLD.He smokes 2 cigs per day and has done so for many years.  Echo from 11/2015with EF of 45 to 50%, moderateLVH, MVP with mild to moderate MR.  Seen for first time by Dr. [name] back in 2014. Cardiac status appeared stable but was short of breath - ordered a Myoview, PFTs and BNP. BNP was 18. Low risk Myoview noted. PFTs without marked abnormality.   I have seen him back over the past few years - I saw him back in January of 2018 - had had some eye surgery and was told he had a "skip" in his heart. He was asymptomatic. Had been out of his beta blocker for at least 6 months - I restarted it. Got his echo updated - EF down more - reviewed with Dr. [name] - set up for Myoview - larger scar noted. Cardiac cath recommended - ended up having PCI.Plan was to update his echo 3 months post PCI.   I then saw him back in Juneof 2018- some "indigestion" reported - but had used NTG with relief.On follow up back in September he had continued to endorse some chest pain. Had been using NTG. Myoview updated- see below.Ended up getting cardiac MRI as well and EF is 47%.Admitsthat he could do better with his CV risk factor modification.When seenback in October of 2018- doing quite well - no chest pain. Had stopped his Lamictal and Lexapro. He was to follow up with psyche and ended up back on his psyche meds. Still smoking about 2  cigs per day.  Last seen back in June and he was doing ok. The pandemic had been hard on him and his psyche. Smoking a little more. Spending time outside. Cardiac status felt to be ok.   The patient does not have symptoms concerning for COVID-19 infection (fever, chills, cough, or new shortness of breath).   Comes in today. Here alone. He is doing ok. No chest pain. He is trying to stay active. Tolerating his medicines. BP is ok. Breathing is ok. Not dizzy or lightheaded.   Past Medical History:  Diagnosis Date  . Anxiety   . Bipolar 1 disorder ([place])   . BPH (benign prostatic hypertrophy)   . CAD (coronary artery disease), native coronary artery    a. DES x 2 to RCA in 2004 b. 02/11/17 PCI w/DES x1 to mRCA  . Depression   . Hyperlipidemia   . Mitral regurgitation and aortic stenosis   . MVP (mitral valve prolapse)   . Myocardial infarction ([place])   . OSA (obstructive sleep apnea) 03/22/2013  . Sleep apnea     Past Surgical History:  Procedure Laterality Date  . CORONARY STENT INTERVENTION N/A 02/12/2017   Procedure: Coronary Stent Intervention;  Surgeon: [name], MD;  Location: [facility];  Service: Cardiovascular;  Laterality: N/A;  .  LEFT HEART CATH AND CORONARY ANGIOGRAPHY N/A 02/12/2017   Procedure: Left Heart Cath and Coronary Angiography;  Surgeon: [name], MD;  Location: [facility];  Service: Cardiovascular;  Laterality: N/A;     Medications: Current Meds  Medication Sig  . acetaminophen (TYLENOL) 500 MG tablet Take 500 mg by mouth every 6 (six) hours as needed for mild pain.   [name] aspirin 81 MG tablet Take 81 mg by mouth daily.  [name] atorvastatin (LIPITOR) 80 MG tablet TAKE 1 TABLET EVERY DAY  . clonazePAM (KLONOPIN) 1 MG tablet Take 1 mg by mouth as needed for anxiety.  . divalproex (DEPAKOTE) 500 MG DR tablet TAKE 3 TABLETS (1,500 MG TOTAL) BY MOUTH AT BEDTIME.  [name] escitalopram (LEXAPRO) 20 MG tablet TAKE 1 TABLET EVERY DAY  . isosorbide  mononitrate (IMDUR) 30 MG 24 hr tablet Take 0.5 tablets (15 mg total) by mouth daily.  [name] lamoTRIgine (LAMICTAL) 100 MG tablet Take 100 mg by mouth at bedtime.  . metoprolol tartrate (LOPRESSOR) 25 MG tablet TAKE 1 AND 1/2 TABLETS TWICE DAILY  . montelukast (SINGULAIR) 10 MG tablet Take 1 tablet (10 mg total) by mouth daily.  . nitroGLYCERIN (NITROSTAT) 0.4 MG SL tablet Place 1 tablet (0.4 mg total) under the tongue every 5 (five) minutes as needed for chest pain.  . pantoprazole (PROTONIX) 40 MG tablet Take 1 tablet (40 mg total) by mouth daily.  [name] sulfamethoxazole-trimethoprim (BACTRIM DS) 800-160 MG tablet Take 1 tablet by mouth 2 (two) times daily.  . tamsulosin (FLOMAX) 0.4 MG CAPS capsule TAKE 1 CAPSULE (0.4 MG TOTAL) BY MOUTH DAILY. FOR FLOW  . zolpidem (AMBIEN CR) 12.5 MG CR tablet Take 1 tablet (12.5 mg total) by mouth at bedtime as needed for sleep.   Current Facility-Administered Medications for the 11/29/19 encounter (Office Visit) with Burtis Junes, NP  Medication  . 0.9 %  sodium chloride infusion     Allergies: No Known Allergies  Social History: The patient  reports that he quit smoking about 16 years ago. His smoking use included cigarettes. He has never used smokeless tobacco. He reports previous alcohol use. He reports that he does not use drugs.   Family History: The patient's family history includes COPD in his father; Colon cancer in his sister; Heart disease in his brother and mother.   Review of Systems: Please see the history of present illness.   All other systems are reviewed and negative.   Physical Exam: VS:  BP 124/80   Pulse 81   Ht 5\' 11"  (1.803 m)   Wt 182 lb (82.6 kg)   SpO2 96%   BMI 25.38 kg/m  .  BMI Body mass index is 25.38 kg/m.  Wt Readings from Last 3 Encounters:  11/29/19 182 lb (82.6 kg)  09/06/19 177 lb 6.4 oz (80.5 kg)  06/08/19 184 lb (83.5 kg)    General: Alert and in no acute distress.   HEENT: Normal.  Neck: Supple, no  JVD, carotid bruits, or masses noted.  Cardiac: Regular rate and rhythm. No murmur that I appreciate. No edema.  Respiratory:  Lungs are clear to auscultation bilaterally with normal work of breathing.  GI: Soft and nontender.  MS: No deformity or atrophy. Gait and ROM intact.  Skin: Warm and dry. Color is normal.  Neuro:  Strength and sensation are intact and no gross focal deficits noted.  Psych: Alert, appropriate and with normal affect.   LABORATORY DATA:  EKG:  EKG is  ordered today. This demonstrates NSR with inferior Q's - HR is 81.  Lab Results  Component Value Date   WBC 6.1 06/08/2019   HGB 14.4 06/08/2019   HCT 40.7 06/08/2019   PLT 208 06/08/2019   GLUCOSE 85 06/08/2019   CHOL 172 06/08/2019   TRIG 191 (H) 06/08/2019   HDL 34 (L) 06/08/2019   LDLCALC 100 (H) 06/08/2019   ALT 24 06/08/2019   AST 18 06/08/2019   NA 139 06/08/2019   K 4.4 06/08/2019   CL 101 06/08/2019   CREATININE 0.83 06/08/2019   BUN 11 06/08/2019   CO2 24 06/08/2019   TSH 3.360 07/19/2014   INR 1.0 02/09/2017     BNP (last 3 results) No results for input(s): BNP in the last 8760 hours.  ProBNP (last 3 results) No results for input(s): PROBNP in the last 8760 hours.   Other Studies Reviewed Today:  CARDIAC MRIIMPRESSION11/2018: 1. Normal LV size with wall motion abnormalities noted above. EF 47%.Delayed enhancement images suggestive of prior infarction in the RCA territory.  2. Normal RV size and systolic function.  3. Mitral valve prolapse with mild to moderate mitral regurgitation.  Electronically Signed By: [name] M.D. On: 10/23/2017 16:43  MyoviewStudy Highlights9/2018   Nuclear stress EF: 37%.  Blood pressure demonstrated a normal response to exercise.  There was no ST segment deviation noted during stress.  No T wave inversion was noted during stress.  Defect 1: There is a small defect of moderate severity present in the basal inferior and  mid inferior location.  Findings consistent with prior myocardial infarction. No ischemia.  The left ventricular ejection fraction is moderately decreased (30-44%).    Notes recorded by Burtis Junes, NP on 09/01/2017 at 7:51 AM EDT Ok to report. Dr. [name] has reviewed his Myoview - EF while low - same as prior study - would like to see back in about a month to recheck him. May consider cardiac MRI to get a better sense of his pumping function. For now, stay on current regimen as outlined at his visit.   Echo Study Conclusions 05/2017  - Left ventricle: The cavity size was normal. Wall thickness was normal. The estimated ejection fraction was 50%. Diffuse hypokinesis. Doppler parameters are consistent with abnormal left ventricular relaxation (grade 1 diastolic dysfunction). - Aortic valve: There was no stenosis. - Mitral valve: Mildly calcified annulus. Bileaflet mitral valve prolapse. There was mild to moderate late systolic regurgitation. - Right ventricle: The cavity size was normal. Systolic function was normal. - Pulmonary arteries: No complete TR doppler jet so unable to estimate PA systolic pressure. - Inferior vena cava: The vessel was normal in size. The respirophasic diameter changes were in the normal range (>= 50%), consistent with normal central venous pressure.  Impressions:  - Normal LV size with EF 50%, mild diffuse hypokinesis. Normal RV size and systolic function. Bileaflet mitral valve prolapse with mild to moderate late systolic mitral regurgitation.   Coronary Stent Intervention 02/2017  Conclusion     Mid RCA lesion, 90 %stenosed.  A STENT RESOLUTE ONYX 4.0X12 drug eluting stent was successfully placed.  Prox RCA to Mid RCA lesion, 90 %stenosed.  Post intervention, there is a 0% residual stenosis.  Successful stenting of the mid RCA with DES for in stent restenosis.  Plan: DAPT for one year with ASA and Plavix.  May be suitable for same day discharge.      Assessment/Plan:  1. CAD - s/p PCI to the  RCA March of 2018 -off of DAPT. No ischemia from Myoview from 08/2017. He never had chest pain prior to his PCI - he has been managed medically. He continues to do very well. No worrisome symptoms. Lab today. Encourage CV risk factor modification.   2. LV dysfunction - EF is 47% by prior cardiac MRI - on beta blocker therapy.  He continues to do well clinically and notes no symptoms.   3. Tobacco abuse - still smoking some - total cessation encouraged - this is hard for him.   4. HTN - BP is ok. No changes made today.   5. HLD - on statin - needs labs today.   6. MR - mild to moderate MR on his last echo - he has no murmur on exam - will hold on updating.   7. Psyche disorder -he seems to be back on his medicines. He is followed by [facility].Seems pretty good from this standpoint at this time.   8. COVID-19 Education: The signs and symptoms of COVID-19 were discussed with the patient and how to seek care for testing (follow up with PCP or arrange E-visit).  The importance of social distancing, staying at home, hand hygiene and wearing a mask when out in public were discussed today.  Current medicines are reviewed with the patient today.  The patient does not have concerns regarding medicines other than what has been noted above.  The following changes have been made:  See above.  Labs/ tests ordered today include:    Orders Placed This Encounter  Procedures  . Basic metabolic panel  . CBC no Diff  . Hepatic function panel  . Lipid Profile  . EKG 12-Lead     Disposition:   FU with me in 6 months.   Patient is agreeable to this plan and will call if any problems develop in the interim.   SignedTruitt Merle, NP  11/29/2019 4:22 PM  [facility] [address] [place], [place]  [address] Phone: [phone] Fax: [phone]

## 2019-11-29 ENCOUNTER — Other Ambulatory Visit: Payer: Self-pay

## 2019-11-29 ENCOUNTER — Encounter: Payer: Self-pay | Admitting: Nurse Practitioner

## 2019-11-29 ENCOUNTER — Ambulatory Visit (INDEPENDENT_AMBULATORY_CARE_PROVIDER_SITE_OTHER): Payer: Medicare HMO | Admitting: Nurse Practitioner

## 2019-11-29 VITALS — BP 124/80 | HR 81 | Ht 71.0 in | Wt 182.0 lb

## 2019-11-29 DIAGNOSIS — I5022 Chronic systolic (congestive) heart failure: Secondary | ICD-10-CM | POA: Diagnosis not present

## 2019-11-29 DIAGNOSIS — I259 Chronic ischemic heart disease, unspecified: Secondary | ICD-10-CM

## 2019-11-29 DIAGNOSIS — E7800 Pure hypercholesterolemia, unspecified: Secondary | ICD-10-CM | POA: Diagnosis not present

## 2019-11-29 NOTE — Patient Instructions (Signed)
After Visit Summary:  We will be checking the following labs today - BMET, CBC, HPF and lipids   Medication Instructions:    Continue with your current medicines.    If you need a refill on your cardiac medications before your next appointment, please call your pharmacy.     Testing/Procedures To Be Arranged:  N/A  Follow-Up:   See me in 6 months    At [facility], you and your health needs are our priority.  As part of our continuing mission to provide you with exceptional heart care, we have created designated Provider Care Teams.  These Care Teams include your primary Cardiologist (physician) and Advanced Practice Providers (APPs -  Physician Assistants and Nurse Practitioners) who all work together to provide you with the care you need, when you need it.  Special Instructions:  . Stay safe, stay home, wash your hands for at least 20 seconds and wear a mask when out in public.  . It was good to talk with you today.    Call the [place] office at [phone] if you have any questions, problems or concerns.

## 2019-11-30 ENCOUNTER — Other Ambulatory Visit: Payer: Self-pay | Admitting: Physician Assistant

## 2019-11-30 DIAGNOSIS — E7800 Pure hypercholesterolemia, unspecified: Secondary | ICD-10-CM | POA: Diagnosis not present

## 2019-11-30 DIAGNOSIS — H5210 Myopia, unspecified eye: Secondary | ICD-10-CM | POA: Diagnosis not present

## 2019-11-30 DIAGNOSIS — H2513 Age-related nuclear cataract, bilateral: Secondary | ICD-10-CM | POA: Diagnosis not present

## 2019-11-30 LAB — BASIC METABOLIC PANEL
BUN/Creatinine Ratio: 11 (ref 10–24)
BUN: 10 mg/dL (ref 8–27)
CO2: 25 mmol/L (ref 20–29)
Calcium: 9.5 mg/dL (ref 8.6–10.2)
Chloride: 101 mmol/L (ref 96–106)
Creatinine, Ser: 0.87 mg/dL (ref 0.76–1.27)
GFR calc Af Amer: 108 mL/min/{1.73_m2} (ref >59)
GFR calc non Af Amer: 93 mL/min/{1.73_m2} (ref >59)
Glucose: 99 mg/dL (ref 65–99)
Potassium: 4.6 mmol/L (ref 3.5–5.2)
Sodium: 142 mmol/L (ref 134–144)

## 2019-11-30 LAB — HEPATIC FUNCTION PANEL
ALT: 16 IU/L (ref 0–44)
AST: 17 IU/L (ref 0–40)
Albumin: 4.5 g/dL (ref 3.8–4.8)
Alkaline Phosphatase: 61 IU/L (ref 39–117)
Bilirubin Total: 0.6 mg/dL (ref 0.0–1.2)
Bilirubin, Direct: 0.16 mg/dL (ref 0.00–0.40)
Total Protein: 6.5 g/dL (ref 6.0–8.5)

## 2019-11-30 LAB — LIPID PANEL
Chol/HDL Ratio: 3.6 ratio (ref 0.0–5.0)
Cholesterol, Total: 132 mg/dL (ref 100–199)
HDL: 37 mg/dL — ABNORMAL LOW (ref >39)
LDL Chol Calc (NIH): 74 mg/dL (ref 0–99)
Triglycerides: 116 mg/dL (ref 0–149)
VLDL Cholesterol Cal: 21 mg/dL (ref 5–40)

## 2019-11-30 LAB — CBC
Hematocrit: 43.7 % (ref 37.5–51.0)
Hemoglobin: 15.1 g/dL (ref 13.0–17.7)
MCH: 32.4 pg (ref 26.6–33.0)
MCHC: 34.6 g/dL (ref 31.5–35.7)
MCV: 94 fL (ref 79–97)
Platelets: 178 10*3/uL (ref 150–450)
RBC: 4.66 x10E6/uL (ref 4.14–5.80)
RDW: 12.6 % (ref 11.6–15.4)
WBC: 6.4 10*3/uL (ref 3.4–10.8)

## 2019-12-02 DIAGNOSIS — R197 Diarrhea, unspecified: Secondary | ICD-10-CM | POA: Diagnosis not present

## 2019-12-02 DIAGNOSIS — R6883 Chills (without fever): Secondary | ICD-10-CM | POA: Diagnosis not present

## 2019-12-05 ENCOUNTER — Ambulatory Visit: Payer: Medicare HMO | Admitting: Psychiatry

## 2020-01-05 ENCOUNTER — Ambulatory Visit: Payer: Medicare HMO | Admitting: Psychiatry

## 2020-01-09 ENCOUNTER — Other Ambulatory Visit: Payer: Self-pay

## 2020-01-09 ENCOUNTER — Ambulatory Visit (INDEPENDENT_AMBULATORY_CARE_PROVIDER_SITE_OTHER): Payer: Medicare HMO | Admitting: Psychiatry

## 2020-01-09 DIAGNOSIS — F068 Other specified mental disorders due to known physiological condition: Secondary | ICD-10-CM

## 2020-01-09 DIAGNOSIS — F411 Generalized anxiety disorder: Secondary | ICD-10-CM

## 2020-01-09 DIAGNOSIS — F319 Bipolar disorder, unspecified: Secondary | ICD-10-CM

## 2020-01-09 DIAGNOSIS — F902 Attention-deficit hyperactivity disorder, combined type: Secondary | ICD-10-CM | POA: Diagnosis not present

## 2020-01-09 DIAGNOSIS — Z87898 Personal history of other specified conditions: Secondary | ICD-10-CM | POA: Diagnosis not present

## 2020-01-09 DIAGNOSIS — F600 Paranoid personality disorder: Secondary | ICD-10-CM

## 2020-01-09 DIAGNOSIS — F1991 Other psychoactive substance use, unspecified, in remission: Secondary | ICD-10-CM

## 2020-01-09 NOTE — Progress Notes (Signed)
Psychotherapy Progress Note [facility] Derrick Moore, PhD LP  Patient ID: Derrick [name]     MRN: [id number]     Therapy format: Individual psychotherapy Date: 01/09/2020      Start: 11:10a     Stop: 12:00n     Time Spent: 50 min Location: In-person   Session narrative (presenting needs, interim history, self-report of stressors and symptoms, applications of prior therapy, status changes, and interventions made in session) Talked with Derrick [name] yesterday about her anorexia, enough to level with her that it is an addiction, making reference to his own drinking history and using it as an object lesson rather than a threat.  Also let it be said that he does not want to face losing another daughter.  Derrick [name] says she is in counseling again, not sure whether to believe it.  Wife had already confronted Derrick [name] that she would not come visit as long as she is so thin, can't bear to see her.    "Really very pissed off", thinking about recent experience that made him feel prejudiced against.  Recognized his own aversion to having a black waiter at [employer], for having thought the man would be thinking resentfully about him as a white man, privileged.  Been feeling targeted as a white man, and has been having fantasies (only fantasies) of going to [place] and killing off protesters he believes to be ruining the country.  Clear he would not go do it, but it feels tempting.  Would even let it turn to murder-suicide if he was all the way committed to do it.  Angry about non-maskers, too, with recognition that it communicates disregard for his and others' safety.    Acknowledged sense of threat and the implied satisfaction of acting to control or eliminate perceived threats,   Therapeutic modalities: Cognitive Behavioral Therapy and Solution-Oriented/Positive Psychology  Mental Status/Observations:  Appearance:   Casual     Behavior:  Appropriate  Motor:  Normal  Speech/Language:   Clear  and Coherent  Affect:  Appropriate  Mood:  irritable  Thought process:  concrete  Thought content:    Rumination  Sensory/Perceptual disturbances:    WNL  Orientation:  Fully oriented  Attention:  Good  Concentration:  Fair  Memory:  WNL  Insight:    Fair  Judgment:   Fair  Impulse Control:  Fair   Risk Assessment: Danger to Self: No Self-injurious Behavior: No Danger to Others: No Physical Aggression / Violence: No Duty to Warn: No Access to Firearms a concern: No  Assessment of progress:  mixed results -- progress in asserting with family, regress in paranoid and violent fantasies  Diagnosis:   ICD-10-CM   1. Bipolar I disorder ([place])  F31.9   2. Generalized anxiety disorder  F41.1   3. Attention deficit hyperactivity disorder (ADHD), combined type  F90.2   4. 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 History of drug use disorder  Z87.898   6. Cognitive dysfunction in bipolar disorder ([place])  F06.8    F31.9    Plan:  . Pledge not to act on vigilante fantasies . Stand ready to ask Derrick [name] about her therapy, how it's going, provide gentle accountability.  Option to see about family session with [facility], or more like [facility] [place]. [name] Other recommendations/advice as may be noted above . Continue to utilize previously learned skills ad lib . Maintain medication as prescribed and work faithfully with relevant prescriber(s) if any changes are desired or  seem indicated . Call the clinic on-call service, present to ER, or call 911 if any life-threatening psychiatric crisis Return in about 1 month (around 02/09/2020). Next scheduled in this office Visit date not found  [name], PhD Derrick Moore, PhD LP Clinical Psychologist, [facility] [address] [place], [place] [address] [phone]

## 2020-02-07 ENCOUNTER — Ambulatory Visit (INDEPENDENT_AMBULATORY_CARE_PROVIDER_SITE_OTHER): Payer: Medicare HMO | Admitting: Psychiatry

## 2020-02-07 ENCOUNTER — Other Ambulatory Visit: Payer: Self-pay

## 2020-02-07 DIAGNOSIS — F068 Other specified mental disorders due to known physiological condition: Secondary | ICD-10-CM

## 2020-02-07 DIAGNOSIS — F319 Bipolar disorder, unspecified: Secondary | ICD-10-CM

## 2020-02-07 DIAGNOSIS — F902 Attention-deficit hyperactivity disorder, combined type: Secondary | ICD-10-CM | POA: Diagnosis not present
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
## 2020-02-08 ENCOUNTER — Other Ambulatory Visit: Payer: Self-pay | Admitting: Physician Assistant

## 2020-02-15 ENCOUNTER — Encounter: Payer: Self-pay | Admitting: Physician Assistant

## 2020-02-15 ENCOUNTER — Other Ambulatory Visit: Payer: Self-pay

## 2020-02-15 ENCOUNTER — Ambulatory Visit (INDEPENDENT_AMBULATORY_CARE_PROVIDER_SITE_OTHER): Payer: Medicare HMO | Admitting: Physician Assistant

## 2020-02-15 DIAGNOSIS — F902 Attention-deficit hyperactivity disorder, combined type: Secondary | ICD-10-CM

## 2020-02-15 DIAGNOSIS — G479 Sleep disorder, unspecified: Secondary | ICD-10-CM | POA: Diagnosis not present

## 2020-02-15 DIAGNOSIS — F1011 Alcohol abuse, in remission: Secondary | ICD-10-CM | POA: Diagnosis not present

## 2020-02-15 DIAGNOSIS — R419 Unspecified symptoms and signs involving cognitive functions and awareness: Secondary | ICD-10-CM | POA: Diagnosis not present

## 2020-02-15 DIAGNOSIS — F319 Bipolar disorder, unspecified: Secondary | ICD-10-CM | POA: Diagnosis not present

## 2020-02-15 DIAGNOSIS — F411 Generalized anxiety disorder: Secondary | ICD-10-CM | POA: Diagnosis not present

## 2020-02-15 MED ORDER — DIVALPROEX SODIUM 500 MG PO DR TAB: 1500.0000 mg | DELAYED_RELEASE_TABLET | Freq: Every day | ORAL | 1 refills | Status: DC

## 2020-02-15 MED ORDER — ESCITALOPRAM OXALATE 20 MG PO TABS: ORAL_TABLET | ORAL | 1 refills | Status: DC

## 2020-02-15 MED ORDER — LAMOTRIGINE 100 MG PO TABS: 50.0000 mg | ORAL_TABLET | ORAL | 1 refills | Status: DC

## 2020-02-15 NOTE — Patient Instructions (Signed)
VERY IMPORTANT INSTRUCTIONS;  On the Lamictal (Lamotrigine) 100 mg Take 1/4 of a pill every other day for 2 weeks then increase to 1/4  pill every day for 2 weeks, then increase to 1/2 of a pill daily for 2 weeks, then increase to a whole pill from then on out.

## 2020-02-15 NOTE — Progress Notes (Signed)
[facility]  Patient ID: Derrick [name],  MRN: [id number]  PCP: [name], [name]  Date of Evaluation: 02/15/2020 Time spent:30 minutes  Chief Complaint:  Chief Complaint    Depression      HISTORY/CURRENT STATUS: HPI for routine med check.  Ran out of Lamictal 2 weeks ago.  He did not think it was a big deal and he knew he was coming to see me today so he did not call about it.  He always gets his medications through [facility] and there has never been a problem but states they dropped the ball this time.  He also ran out of his Lipitor.  States he is doing pretty well overall.  He has ups and downs "like everybody else" but thinks he is doing fine under the circumstances of COVID and being concerned about his adult daughter who has an eating disorder.  He and his wife have their ups and downs with their concern for her.  Other than that, his family is doing well.  Since being off the Lamictal for 2 weeks he has had no depression symptoms.  He is able to enjoy things, energy and motivation are good, he does not cry easily.  Appetite is normal, weight is stable.  He denies any suicidal or homicidal thoughts.  Patient denies increased energy with decreased need for sleep, no increased talkativeness, no racing thoughts, no impulsivity or risky behaviors, no increased spending, no increased libido, no grandiosity.  He gets anxious occasionally depending on the situation.  Since COVID hit last year, he has not had to be around people a lot.  He would have more anxiety when around a lot of people.  "That has been a good thing about COVID.  I am not expected to do social things much."  He is sleeping well most of the time.  Denies dizziness, syncope, seizures, numbness, tingling, tremor, tics, unsteady gait, slurred speech, confusion. Denies muscle or joint pain, stiffness, or dystonia.  Individual Medical History/ Review of Systems: Changes? :No   Allergies: Patient has no known  allergies.  Current Medications:  Current Outpatient Medications:  .  acetaminophen (TYLENOL) 500 MG tablet, Take 500 mg by mouth every 6 (six) hours as needed for mild pain. , Disp: , Rfl:  .  aspirin 81 MG tablet, Take 81 mg by mouth daily., Disp: , Rfl:  .  atorvastatin (LIPITOR) 80 MG tablet, TAKE 1 TABLET EVERY DAY, Disp: 90 tablet, Rfl: 3 .  divalproex (DEPAKOTE) 500 MG DR tablet, Take 3 tablets (1,500 mg total) by mouth at bedtime., Disp: 270 tablet, Rfl: 1 .  escitalopram (LEXAPRO) 20 MG tablet, TAKE 1 TABLET EVERY DAY, Disp: 90 tablet, Rfl: 1 .  isosorbide mononitrate (IMDUR) 30 MG 24 hr tablet, Take 0.5 tablets (15 mg total) by mouth daily., Disp: 45 tablet, Rfl: 3 .  metoprolol tartrate (LOPRESSOR) 25 MG tablet, TAKE 1 AND 1/2 TABLETS TWICE DAILY, Disp: 270 tablet, Rfl: 3 .  montelukast (SINGULAIR) 10 MG tablet, TAKE 1 TABLET (10 MG TOTAL) BY MOUTH DAILY., Disp: 90 tablet, Rfl: 0 .  nitroGLYCERIN (NITROSTAT) 0.4 MG SL tablet, Place 1 tablet (0.4 mg total) under the tongue every 5 (five) minutes as needed for chest pain., Disp: 25 tablet, Rfl: 3 .  pantoprazole (PROTONIX) 40 MG tablet, Take 1 tablet (40 mg total) by mouth daily., Disp: 90 tablet, Rfl: 3 .  clonazePAM (KLONOPIN) 1 MG tablet, Take 1 mg by mouth as needed for anxiety., Disp: ,  Rfl:  .  lamoTRIgine (LAMICTAL) 100 MG tablet, Take 0.5 tablets (50 mg total) by mouth as directed. Patient has written instructions., Disp: 90 tablet, Rfl: 1 .  sulfamethoxazole-trimethoprim (BACTRIM DS) 800-160 MG tablet, Take 1 tablet by mouth 2 (two) times daily. (Patient not taking: Reported on 02/15/2020), Disp: 20 tablet, Rfl: 0 .  tamsulosin (FLOMAX) 0.4 MG CAPS capsule, TAKE 1 CAPSULE (0.4 MG TOTAL) BY MOUTH DAILY. FOR FLOW (Patient not taking: Reported on 02/15/2020), Disp: 30 capsule, Rfl: 11 .  zolpidem (AMBIEN CR) 12.5 MG CR tablet, Take 1 tablet (12.5 mg total) by mouth at bedtime as needed for sleep. (Patient not taking: Reported on  02/15/2020), Disp: 30 tablet, Rfl: 1  Current Facility-Administered Medications:  .  0.9 %  sodium chloride infusion, 500 mL, Intravenous, Once, Jackquline Denmark, MD Medication Side Effects: none  Family Medical/ Social History: Changes? No  MENTAL HEALTH EXAM:  There were no vitals taken for this visit.There is no height or weight on file to calculate BMI.  General Appearance: Casual, Neat and Well Groomed  Eye Contact:  Good  Speech:  Clear and Coherent and Normal Rate  Volume:  Normal  Mood:  Euthymic  Affect:  Appropriate  Thought Process:  Goal Directed and Descriptions of Associations: Intact  Orientation:  Full (Time, Place, and Person)  Thought Content: Logical   Suicidal Thoughts:  No  Homicidal Thoughts:  No  Memory:  WNL  Judgement:  Good  Insight:  Good  Psychomotor Activity:  Fidgety but normal for him.  Concentration:  Concentration: Good  Recall:  Good  Fund of Knowledge: Good  Language: Good  Assets:  Desire for Improvement  ADL's:  Intact  Cognition: WNL  Prognosis:  Good    DIAGNOSES:    ICD-10-CM   1. Bipolar I disorder ([place])  F31.9   2. Generalized anxiety disorder  F41.1   3. History of alcohol abuse  F10.11   4. Sleep disorder with cognitive complaints  G47.9    R41.9   5. Attention deficit hyperactivity disorder (ADHD), combined type  F90.2     Receiving Psychotherapy: Yes with Dr. [name]   RECOMMENDATIONS:  PDMP reviewed. I spent 30 minutes with him. Because he has been off of the Lamictal for more than 5 days, he will have to start over with the titration.  Counseled patient regarding potential benefits, risks, and side effects of Lamictal to include potential risk of Stevens-Johnson syndrome. Advised patient to stop taking Lamictal and contact office immediately if rash develops and to seek urgent medical attention if rash is severe and/or spreading quickly.  Patient understands and accepts these risks.  He understands that we have to  start the dose extremely low, and increase gradually or the risk of that rash goes up exponentially.  He asks for Lamictal 100 mg and states that he can cut the pill into 1/4 and increase as appropriate.  I typed out detailed instructions on the AVS and make sure that he got that before he left the office.  I trust him to follow these directions.  He knows to call our office if he has any questions about them or especially if he gets the rash. Restart Lamictal 100 mg, 1/4 po qod for 2 weeks, then 1/4 qd for 2 weeks,  then increase to 1/2 of a pill daily for 2 weeks, then increase to a whole pill from then on out. Continue Depakote 500 mg DR, 3 p.o. nightly. Continue Lexapro 20  mg 1 p.o. daily. Continue therapy with Dr. [name]. Return in 3 months.  Donnal Moat, [name]

## 2020-02-17 ENCOUNTER — Other Ambulatory Visit: Payer: Self-pay

## 2020-02-17 MED ORDER — LAMOTRIGINE 100 MG PO TABS: 100.0000 mg | ORAL_TABLET | Freq: Every day | ORAL | 1 refills | Status: DC

## 2020-02-23 ENCOUNTER — Telehealth: Payer: Self-pay | Admitting: Physician Assistant

## 2020-02-23 NOTE — Chronic Care Management (AMB) (Signed)
  Chronic Care Management   Note  02/23/2020 Name: DWAYN MORAVEK MRN: [id number] DOB: [birth year]  JOHNGABRIEL VERDE is a 64 y.o. year old male who is a primary care patient of [name], [name]. I reached out to [name] by phone today in response to a referral sent by Mr. Gerrad Welker Nicholes's health plan.     Mr. Hommes was given information about Chronic Care Management services today including:  1. CCM service includes personalized support from designated clinical staff supervised by his physician, including individualized plan of care and coordination with other care providers 2. 24/7 contact phone numbers for assistance for urgent and routine care needs. 3. Service will only be billed when office clinical staff spend 20 minutes or more in a month to coordinate care. 4. Only one practitioner may furnish and bill the service in a calendar month. 5. The patient may stop CCM services at any time (effective at the end of the month) by phone call to the office staff. 6. The patient will be responsible for cost sharing (co-pay) of up to 20% of the service fee (after annual deductible is met).  Patient agreed to services and verbal consent obtained.   Follow up plan: Telephone appointment with care management team member scheduled for: 05/31/2020  [facility]  [place], [place] [address] Direct Dial: [email or web address] Website: [email or web address]

## 2020-02-24 ENCOUNTER — Other Ambulatory Visit: Payer: Self-pay | Admitting: Physician Assistant

## 2020-03-06 ENCOUNTER — Other Ambulatory Visit: Payer: Self-pay

## 2020-03-06 ENCOUNTER — Ambulatory Visit (INDEPENDENT_AMBULATORY_CARE_PROVIDER_SITE_OTHER): Payer: Medicare HMO | Admitting: Psychiatry

## 2020-03-06 DIAGNOSIS — Z636 Dependent relative needing care at home: Secondary | ICD-10-CM

## 2020-03-06 DIAGNOSIS — F068 Other specified mental disorders due to known physiological condition: Secondary | ICD-10-CM | POA: Diagnosis not present

## 2020-03-06 DIAGNOSIS — F319 Bipolar disorder, unspecified: Secondary | ICD-10-CM

## 2020-03-06 DIAGNOSIS — F902 Attention-deficit hyperactivity disorder, combined type: Secondary | ICD-10-CM

## 2020-03-06 DIAGNOSIS — F600 Paranoid personality disorder: Secondary | ICD-10-CM

## 2020-03-06 NOTE — Progress Notes (Signed)
Psychotherapy Progress Note [facility] [name], PhD LP  Patient ID: Derrick [name]     MRN: [id number] Therapy format: Individual psychotherapy Date: 03/06/2020      Start: 11:22a     Stop: 12:08p     Time Spent: 46 min Location: In-person   Session narrative (presenting needs, interim history, self-report of stressors and symptoms, applications of prior therapy, status changes, and interventions made in session) Saw Derrick [name] recently, looks more emaciated, and covering with loose-fitting clothing.  She walks daily, long distances, starting 4am.  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  Her husband Derrick [name] seems to be ineffective.  Seeing signs that she is overcontrolling what the grandchildren eat, too, and says she does not cook -- likely theory that it is too tempting, threatens her perfectionistic self-control.  Support/empathy provided and suggested it is going to take an intervention, for which PT, W, and her husband Derrick [name] should prepare together.  PT hard to focus on this, regards himself powerless to make any change.  Offered my own limited expertise if the family want to meet about it.  Otherwise, incensed by news these days that to him signals the country is going to hell.  Mentions seeing a news story on more conservative coverage about Black protesters heckling a participants and parents at a cheerleading competition in [place], purportedly building on outrage over the [employer] incident.  Angered by behavior he regarded as unfairly targeting and guilting the participants.  Probed how much it seemed to be about his own racial identity seeming threatened, but denies.  Does realize he might not feel as adamant if it was two other races besides his own in conflict.  For this, though, reports fantasies of throwing a grenade in the bullhorn of a protester.  Also up in arms about story of [place] governor backing out of signing a law that would ban transgender  athletes from competing in the state.  Feels it was failing to stand up for what's right.  At bottom, PT feels it's unfair to women in sports to let morphological males compete, which is understandable, but also disagrees vehemently with men dressing as women.  Probed the issue, discovering confusion between transvestism and transgender, educated on the difference, was glad to learn.    Discussed displaced aggression, basically how feeling really helpless about one thing can fire you up about unrelated things, enough to feel more warlike.  Agreed it is a tendency that any of [place] could have, but something we need to fight in ourselves to help the state of society, especially if any news coverage is catering to anger.  Therapeutic modalities: Cognitive Behavioral Therapy and Solution-Oriented/Positive Psychology  Mental Status/Observations:  Appearance:   Casual     Behavior:  Appropriate  Motor:  Normal  Speech/Language:   WNL  Affect:  Appropriate and more animated  Mood:  irritable, depressed  Thought process:  concrete  Thought content:    Paranoid Ideation  Sensory/Perceptual disturbances:    WNL  Orientation:  Fully oriented  Attention:  Good  Concentration:  Fair  Memory:  grossly intact  Insight:    Fair  Judgment:   Fair  Impulse Control:  Fair   Risk Assessment: Danger to Self: No Self-injurious Behavior: No Danger to Others: ideation, no plan/intent Physical Aggression / Violence: No Duty to Warn: No Access to Firearms a concern: No  Assessment of progress:  stabilized  Diagnosis:   ICD-10-CM   1. Bipolar I  disorder ([place])  F31.9   2. Attention deficit hyperactivity disorder (ADHD), combined type  F90.2   3. Cognitive dysfunction in bipolar disorder ([place])  F06.8    F31.9   4. 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 Caregiver stress  Z63.6    Plan:  . Consider intervention for Derrick [name] and what would be enough to move on committing her, discuss with wife and son  in law . Maintain abstinence from past substances of abuse . Other recommendations/advice as may be noted above . Continue to utilize previously learned skills ad lib . Maintain medication as prescribed and work faithfully with relevant prescriber(s) if any changes are desired or seem indicated . Call the clinic on-call service, present to ER, or call 911 if any life-threatening psychiatric crisis . Return in about 1 month (around 04/06/2020) for time as available.[name]  Next scheduled visit in this office 05/17/2020.  [name], PhD [name], PhD LP Clinical Psychologist, [facility] [address] [place], [place] [address] [phone]

## 2020-03-23 ENCOUNTER — Telehealth: Payer: Self-pay | Admitting: Physician Assistant

## 2020-03-23 NOTE — Chronic Care Management (AMB) (Signed)
  Care Management   Note  03/23/2020 Name: [name] MRN: [id number] DOB: 17-Oct-1956  Derrick [name] is a 64 y.o. year old male who is a primary care patient of Theodoro Clock and is actively engaged with the care management team. I reached out to [name] by phone today to assist with re-scheduling an initial visit with the RN Case Manager  Follow up plan: Unsuccessful telephone outreach attempt made. A HIPPA compliant phone message was left for the patient providing contact information and requesting a return call.  The care management team will reach out to the patient again over the next 7 days.  If patient returns call to provider office, please advise to call [facility]  at [place], [place], [place], [place] [address] Direct Dial: [phone] [email or web address] Website: [email or web address]

## 2020-03-26 NOTE — Chronic Care Management (AMB) (Signed)
  Care Management   Note  03/26/2020 Name: Derrick [name] MRN: [id number] DOB: 02-Jun-1956  Derrick [name] is a 64 y.o. year old male who is a primary care patient of Theodoro Clock and is actively engaged with the care management team. I reached out to [name] by phone today to assist with re-scheduling an initial visit with the RN Case Manager Licensed Clinical Social Worker  Follow up plan: Telephone appointment with care management team member scheduled for: 04/18/2020 with LCSW Pt wanted sooner appt   Noreene Larsson, [place], [place], [place] [address] Direct Dial: [phone] [email or web address] Website: [email or web address]

## 2020-04-05 ENCOUNTER — Other Ambulatory Visit: Payer: Self-pay

## 2020-04-05 ENCOUNTER — Ambulatory Visit (INDEPENDENT_AMBULATORY_CARE_PROVIDER_SITE_OTHER): Payer: Medicare HMO | Admitting: Psychiatry

## 2020-04-05 DIAGNOSIS — F600 Paranoid personality disorder: Secondary | ICD-10-CM | POA: Diagnosis not present

## 2020-04-05 DIAGNOSIS — Z87898 Personal history of other specified conditions: Secondary | ICD-10-CM | POA: Diagnosis not present

## 2020-04-05 DIAGNOSIS — F319 Bipolar disorder, unspecified: Secondary | ICD-10-CM | POA: Diagnosis not present

## 2020-04-05 DIAGNOSIS — Z636 Dependent relative needing care at home: Secondary | ICD-10-CM | POA: Diagnosis not present

## 2020-04-05 DIAGNOSIS — F1011 Alcohol abuse, in remission: Secondary | ICD-10-CM

## 2020-04-05 DIAGNOSIS — F902 Attention-deficit hyperactivity disorder, combined type: Secondary | ICD-10-CM | POA: Diagnosis not present

## 2020-04-05 DIAGNOSIS — F1991 Other psychoactive substance use, unspecified, in remission: Secondary | ICD-10-CM

## 2020-04-05 NOTE — Progress Notes (Signed)
Psychotherapy Progress Note [facility] [name], PhD LP  Patient ID: KRISTAIN ZUNICH     MRN: [id number] Therapy format: Individual psychotherapy Date: 04/05/2020      Start: 2:13p     Stop: 3:15p     Time Spent: 62 min Location: In-person   Session narrative (presenting needs, interim history, self-report of stressors and symptoms, applications of prior therapy, status changes, and interventions made in session) Was to have COVID vaccination, but J&J vax stopped for now.   Saw [name] recently for 98yo granddaughter's birthday -- she looks terrible, and the autistic son [name])  said out loud that he hates this family, because they don't give him enough to eat.  Seems plain from observations he doesn't get enough to eat, and evidence is that [name] is overcontrolling his food according to her own obsession that he will get fat and be made fun of.  Has seen the boy over-apologize to everyone in the room if called down for behavior.  Tends to pester about things, most recently when he is coming to visit grandparents.  Her husband Herbie Baltimore seems to be silent about it all.  Anticipates 2 days/wk through the summer being able to keep [facility]  Wife [name] so angry that she has told [name] she won't come over any more until she gains weight.  Advised this is unlikely to work, and they do have the right and the opportunity to call DSS if they feel [name] needs more protection.  Issue flaring today with neighbor of 47yrs.  He was born deaf, communicates in grunts and handwriting, 64 yrs old, gets impulsive, tried to tag along with PT today.  Asks a lot of things, like favors doing yard work, come along on things.  Pt may do favors, like help with bills, but sometimes angers PT terribly, like if he needs a patch of lawn mowed, PT was showing him how, and he walked off down the road.  Today, he tried to get in the car while PT backing out to come here.    Admits he has a checking  compulsion -- lights, locks, etc. before leaving the house.  Gets intensely angry about some news -- feels the country is falling apart.  Says he would take a hand grenade to the [place] Antifa protests if he could.  Some of neighbors' driving (too fast, perceived safety risk) sets him off.  Discussed civil responses.  End of session, admits liquor at night -- a "shot" (jelly glass, closer to 3 shots, really), after [name] goes to bed.  Likes how it relaxes him, doesn't wake up in the night, but he went through a fifth in one week.  Has not bought again, says he doesn't have the money.  Did lie to wife about getting it.  Supportively confronted as effective to relax but risk of destabilizing Bipolar D/O and stoking up the kind of anger and resentment he describes.  Does not have or use any sedative right now, though Klonopin is on the chart, and he does not use Ambien for sleep at this time.  Given that we have already gone overtime, taught a 1-minute relaxer (total body clench and release), confirmed quick benefit, worth trying at other times.  Therapeutic modalities: Cognitive Behavioral Therapy and Solution-Oriented/Positive Psychology  Mental Status/Observations:  Appearance:   Casual and Neat     Behavior:  Appropriate  Motor:  Normal  Speech/Language:   Clear and Coherent  Affect:  animated, not labile  Mood:  frustrated/irritated, in places  Thought process:  normal  Thought content:    WNL  Sensory/Perceptual disturbances:    WNL  Orientation:  Fully oriented  Attention:  Good    Concentration:  Good  Memory:  grossly intact  Insight:    Fair  Judgment:   Good  Impulse Control:  Fair   Risk Assessment: Danger to Self: No Self-injurious Behavior: No Danger to Others: No Physical Aggression / Violence: No Duty to Warn: No Access to Firearms a concern: No  Assessment of progress:  stabilized  Diagnosis:   ICD-10-CM   1. Bipolar I disorder ([place])  F31.9   2. Attention deficit  hyperactivity disorder (ADHD), combined type  F90.2   3. 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 Caregiver stress  Z63.6   5. History of drug use disorder  Z87.898   6. History of alcohol abuse  F10.11    Plan:  . Option DSS for Fletcher's welfare.  Meanwhile, good to be volunteering time taking care of him. . Suggest report speeding problem to [place] -- police, town council member, [employer] office, ask for speed bump or other physical control -- and give self credit for doing something reasonable, rather than stoking fantasies of violence . Find another way to relax besides alcohol -- try total body clench and relax, any time, including evening . Other recommendations/advice as may be noted above . Continue to utilize previously learned skills ad lib . Maintain medication as prescribed and work faithfully with relevant prescriber(s) if any changes are desired or seem indicated . Call the clinic on-call service, present to ER, or call 911 if any life-threatening psychiatric crisis Return up to 1 mo. . Already scheduled visit in this office 05/17/2020.  [name], PhD [name], PhD LP Clinical Psychologist, [facility] [address], [place], [place] [address] [phone]

## 2020-04-18 ENCOUNTER — Ambulatory Visit (INDEPENDENT_AMBULATORY_CARE_PROVIDER_SITE_OTHER): Payer: Medicare HMO | Admitting: Licensed Clinical Social Worker

## 2020-04-18 DIAGNOSIS — F3170 Bipolar disorder, currently in remission, most recent episode unspecified: Secondary | ICD-10-CM

## 2020-04-18 DIAGNOSIS — G4700 Insomnia, unspecified: Secondary | ICD-10-CM

## 2020-04-18 DIAGNOSIS — G4733 Obstructive sleep apnea (adult) (pediatric): Secondary | ICD-10-CM

## 2020-04-18 DIAGNOSIS — E7800 Pure hypercholesterolemia, unspecified: Secondary | ICD-10-CM

## 2020-04-18 NOTE — Patient Instructions (Addendum)
Licensed Clinical Social Worker Visit Information  Goals we discussed today:  Goals Addressed            This Visit's Progress   . Client will talk with LCSW in next 30 days to discuss anxiety or stress issues of client (pt-stated)       CARE PLAN ENTRY   Current Barriers:  [name] Mental Hieath issues in client with Chronic Diagnoses of Bipolar Disorder,Anxiety Disorder, Insomnia, OSA, dyspnea . OSA  Clinical Social Work Clinical Goal(s):  [name] LCSW will call client in next 30 daoys to discuss anxiety or stress issues of client  Interventions: . Talked with client about CCM program support  . Talked with client about sleeping issues of client . Talked with client about medication procurement of client . Talked with client about relaxation techniques (watches TV, enjoys walking) . Talked with client about mental health support at [facility] in [place], [place] . Talked with client about social support network (wife is supportive,granddaughter is supportive) . Talked with client about his medical appointments (said he sees cardiologist  every 6 months) . Talked with client about decreased energy (takes rest breaks as needed)  Patient Self Care Activities:  Does ADLs indenpendently Drives to appointments  Patient Self Care Deficits:   [name] Mental health issues  Initial goal documentation .     Materials Provided: No  Follow Up Plan: LCSW to call client in next 4 weeks to talk with client about client management of anxiety and depression issues faced  The patient verbalized understanding of instructions provided today and declined a print copy of patient instruction materials.   [name].Drayton Tieu MSW, LCSW Licensed Clinical Social Worker [facility] Care Management [phone]

## 2020-04-18 NOTE — Chronic Care Management (AMB) (Signed)
Chronic Care Management    Clinical Social Work Follow Up Note  04/18/2020 Name: Derrick [name] MRN: [id number] DOB: Jan 17, 1956  Derrick [name] is a 64 y.o. year old male who is a primary care patient of Dettinger, Ivin Booty, MD. The CCM team was consulted for assistance with [facility].   Review of patient status, including review of consultants reports, other relevant assessments, and collaboration with appropriate care team members and the patient's provider was performed as part of comprehensive patient evaluation and provision of chronic care management services.    SDOH (Social Determinants of Health) assessments performed: Yes;risk for depression; risk for stress/anxiety  SDOH Interventions     Most Recent Value  SDOH Interventions  Depression Interventions/Treatment   Medication       Chronic Care Management from 04/18/2020 in [facility]  PHQ-9 Total Score  6     GAD 7 : Generalized Anxiety Score 04/18/2020  Nervous, Anxious, on Edge 1  Control/stop worrying 0  Worry too much - different things 0  Trouble relaxing 1  Restless 1  Easily annoyed or irritable 0  Afraid - awful might happen 0  Total GAD 7 Score 3  Anxiety Difficulty Somewhat difficult   Outpatient Encounter Medications as of 04/18/2020  Medication Sig  . acetaminophen (TYLENOL) 500 MG tablet Take 500 mg by mouth every 6 (six) hours as needed for mild pain.   [name] aspirin 81 MG tablet Take 81 mg by mouth daily.  [name] atorvastatin (LIPITOR) 80 MG tablet TAKE 1 TABLET EVERY DAY  . clonazePAM (KLONOPIN) 1 MG tablet Take 1 mg by mouth as needed for anxiety.  . divalproex (DEPAKOTE) 500 MG DR tablet TAKE 3 TABLETS (1,500 MG TOTAL) BY MOUTH AT BEDTIME.  [name] escitalopram (LEXAPRO) 20 MG tablet TAKE 1 TABLET EVERY DAY  . isosorbide mononitrate (IMDUR) 30 MG 24 hr tablet Take 0.5 tablets (15 mg total) by mouth daily.  [name] lamoTRIgine (LAMICTAL) 100 MG tablet Take 1 tablet (100 mg total) by mouth  daily. Patient has written instructions.  . metoprolol tartrate (LOPRESSOR) 25 MG tablet TAKE 1 AND 1/2 TABLETS TWICE DAILY  . montelukast (SINGULAIR) 10 MG tablet TAKE 1 TABLET (10 MG TOTAL) BY MOUTH DAILY.  . nitroGLYCERIN (NITROSTAT) 0.4 MG SL tablet Place 1 tablet (0.4 mg total) under the tongue every 5 (five) minutes as needed for chest pain.  . pantoprazole (PROTONIX) 40 MG tablet Take 1 tablet (40 mg total) by mouth daily.  [name] sulfamethoxazole-trimethoprim (BACTRIM DS) 800-160 MG tablet Take 1 tablet by mouth 2 (two) times daily. (Patient not taking: Reported on 02/15/2020)  . tamsulosin (FLOMAX) 0.4 MG CAPS capsule TAKE 1 CAPSULE (0.4 MG TOTAL) BY MOUTH DAILY. FOR FLOW (Patient not taking: Reported on 02/15/2020)  . zolpidem (AMBIEN CR) 12.5 MG CR tablet Take 1 tablet (12.5 mg total) by mouth at bedtime as needed for sleep. (Patient not taking: Reported on 02/15/2020)   Facility-Administered Encounter Medications as of 04/18/2020  Medication  . 0.9 %  sodium chloride infusion     Goals Addressed            This Visit's Progress   . Client will talk with LCSW in next 30 days to discuss anxiety or stress issues of client (pt-stated)       CARE PLAN ENTRY  Current Barriers:  [name] Mental Hieath issues in client with Chronic Diagnoses of Bipolar Disorder, Anxiety disorder, Insomnia, OSA, Dyspnea . OSA  Clinical Social Work Clinical Goal(s):  .  LCSW will call client in next 30 daoys to discuss anxiety or stress issues of client  Interventions: . Talked with client about CCM program support  . Talked with client about sleeping issues of client . Talked with client about medication procurement of client . Talked with client about relaxation techniques (watches TV, enjoys walking) . Talked with client about mental health support at [facility] in [place], [place] . Talked with client about social support network (wife is supportive,granddaughter is supportive) . Talked with client  about his medical appointments (said he sees cardiologist  every 6 months) . Talked with client about decreased energy (takes rest breaks as needed)  Patient Self Care Activities:  Does ADLs indenpendently Drives to appointments  Patient Self Care Deficits:   [name] Mental health issues  Initial goal documentation .,       Follow Up Plan: LCSW to call client in next 4 weeks to talk with client about client management of anxiety and depression issues faced  Kelton Pillar.Cade Olberding MSW, LCSW Licensed Clinical Social Worker [facility] Care Management [phone]

## 2020-05-02 ENCOUNTER — Ambulatory Visit: Payer: Medicare HMO | Admitting: Psychiatry

## 2020-05-04 ENCOUNTER — Ambulatory Visit: Payer: Medicare HMO | Admitting: Psychiatry

## 2020-05-16 NOTE — Progress Notes (Deleted)
CARDIOLOGY OFFICE NOTE  Date:  05/29/2020    Derrick [name] Date of Birth: 12/29/1955 Medical Record [id number]  PCP:  [name], [name]  Cardiologist:  [name] chief complaint on file.   History of Present Illness: Derrick [name] is a 64 y.o. male who presents today for a 6 month check.  Seen for Dr. [name].Primarily follows with me.  He has a history of CAD s/p inferior STEMI with DES x 2 to RCA in 2004 and moderate MR with mitral valve prolapse. His other issues include tobacco abuse, bipolar disorder, OSA and HLD.He smokes 2 cigs per day and has done so for many years.  Echo from 11/2015with EF of 45 to 50%, moderateLVH, MVP with mild to moderate MR.  I have seen him back over the past few years - Got his echo updated in early 2018 - EF down more - reviewed with Dr. [name] - set up for Myoview - larger scar noted. Cardiac cath recommended - ended up having PCI to the RCA in March of 2018.Cardiac MRI shows his EF is 47%. Last seen in December - was felt to be doing ok.   The patient {does/does not:200015} have symptoms concerning for COVID-19 infection (fever, chills, cough, or new shortness of breath).   Comes in today. Here with   Past Medical History:  Diagnosis Date  . Anxiety   . Bipolar 1 disorder ([place])   . BPH (benign prostatic hypertrophy)   . CAD (coronary artery disease), native coronary artery    a. DES x 2 to RCA in 2004 b. 02/11/17 PCI w/DES x1 to mRCA  . Depression   . Hyperlipidemia   . Mitral regurgitation and aortic stenosis   . MVP (mitral valve prolapse)   . Myocardial infarction ([place])   . OSA (obstructive sleep apnea) 03/22/2013  . Sleep apnea     Past Surgical History:  Procedure Laterality Date  . CORONARY STENT INTERVENTION N/A 02/12/2017   Procedure: Coronary Stent Intervention;  Surgeon: [name], MD;  Location: [facility];  Service: Cardiovascular;  Laterality: N/A;  . LEFT HEART CATH AND CORONARY  ANGIOGRAPHY N/A 02/12/2017   Procedure: Left Heart Cath and Coronary Angiography;  Surgeon: [name], MD;  Location: [facility];  Service: Cardiovascular;  Laterality: N/A;     Medications: No outpatient medications have been marked as taking for the 05/30/20 encounter (Appointment) with Burtis Junes, NP.   Current Facility-Administered Medications for the 05/30/20 encounter (Appointment) with Burtis Junes, NP  Medication  . 0.9 %  sodium chloride infusion    Allergies: No Known Allergies  Social History: The patient  reports that he quit smoking about 17 years ago. His smoking use included cigarettes. He smoked 0.10 packs per day. He has never used smokeless tobacco. He reports current alcohol use. He reports that he does not use drugs.   Family History: The patient's ***family history includes COPD in his father; Colon cancer in his sister; Heart disease in his brother and mother.   Review of Systems: Please see the history of present illness.   All other systems are reviewed and negative.   Physical Exam: VS:  There were no vitals taken for this visit. [name]  BMI There is no height or weight on file to calculate BMI.  Wt Readings from Last 3 Encounters:  11/29/19 182 lb (82.6 kg)  09/06/19 177 lb 6.4 oz (80.5  kg)  06/08/19 184 lb (83.5 kg)    General: Pleasant. Well developed, well nourished and in no acute distress.   HEENT: Normal.  Neck: Supple, no JVD, carotid bruits, or masses noted.  Cardiac: ***Regular rate and rhythm. No murmurs, rubs, or gallops. No edema.  Respiratory:  Lungs are clear to auscultation bilaterally with normal work of breathing.  GI: Soft and nontender.  MS: No deformity or atrophy. Gait and ROM intact.  Skin: Warm and dry. Color is normal.  Neuro:  Strength and sensation are intact and no gross focal deficits noted.  Psych: Alert, appropriate and with normal affect.   LABORATORY DATA:  EKG:  EKG {ACTION; IS/IS [id number]  ordered today.  Personally reviewed by me. This demonstrates ***.  Lab Results  Component Value Date   WBC 6.4 11/29/2019   HGB 15.1 11/29/2019   HCT 43.7 11/29/2019   PLT 178 11/29/2019   GLUCOSE 99 11/29/2019   CHOL 132 11/29/2019   TRIG 116 11/29/2019   HDL 37 (L) 11/29/2019   LDLCALC 74 11/29/2019   ALT 16 11/29/2019   AST 17 11/29/2019   NA 142 11/29/2019   K 4.6 11/29/2019   CL 101 11/29/2019   CREATININE 0.87 11/29/2019   BUN 10 11/29/2019   CO2 25 11/29/2019   TSH 3.360 07/19/2014   INR 1.0 02/09/2017     BNP (last 3 results) No results for input(s): BNP in the last 8760 hours.  ProBNP (last 3 results) No results for input(s): PROBNP in the last 8760 hours.   Other Studies Reviewed Today:  CARDIAC MRIIMPRESSION11/2018: 1. Normal LV size with wall motion abnormalities noted above. EF 47%.Delayed enhancement images suggestive of prior infarction in the RCA territory.  2. Normal RV size and systolic function.  3. Mitral valve prolapse with mild to moderate mitral regurgitation.  Electronically Signed By: [name] M.D. On: 10/23/2017 16:43  MyoviewStudy Highlights9/2018   Nuclear stress EF: 37%.  Blood pressure demonstrated a normal response to exercise.  There was no ST segment deviation noted during stress.  No T wave inversion was noted during stress.  Defect 1: There is a small defect of moderate severity present in the basal inferior and mid inferior location.  Findings consistent with prior myocardial infarction. No ischemia.  The left ventricular ejection fraction is moderately decreased (30-44%).    Notes recorded by Burtis Junes, NP on 09/01/2017 at 7:51 AM EDT Ok to report. Dr. [name] has reviewed his Myoview - EF while low - same as prior study - would like to see back in about a month to recheck him. May consider cardiac MRI to get a better sense of his pumping function. For now, stay on current regimen as  outlined at his visit.   Echo Study Conclusions 05/2017  - Left ventricle: The cavity size was normal. Wall thickness was normal. The estimated ejection fraction was 50%. Diffuse hypokinesis. Doppler parameters are consistent with abnormal left ventricular relaxation (grade 1 diastolic dysfunction). - Aortic valve: There was no stenosis. - Mitral valve: Mildly calcified annulus. Bileaflet mitral valve prolapse. There was mild to moderate late systolic regurgitation. - Right ventricle: The cavity size was normal. Systolic function was normal. - Pulmonary arteries: No complete TR doppler jet so unable to estimate PA systolic pressure. - Inferior vena cava: The vessel was normal in size. The respirophasic diameter changes were in the normal range (>= 50%), consistent with normal central venous pressure.  Impressions:  - Normal LV size with  EF 50%, mild diffuse hypokinesis. Normal RV size and systolic function. Bileaflet mitral valve prolapse with mild to moderate late systolic mitral regurgitation.   Coronary Stent Intervention 02/2017  Conclusion     Mid RCA lesion, 90 %stenosed.  A STENT RESOLUTE ONYX 4.0X12 drug eluting stent was successfully placed.  Prox RCA to Mid RCA lesion, 90 %stenosed.  Post intervention, there is a 0% residual stenosis.  Successful stenting of the mid RCA with DES for in stent restenosis.  Plan: DAPT for one year with ASA and Plavix. May be suitable for same day discharge.     Assessment/Plan:  1. CAD - s/p PCI to the RCA March of 2018 -off of DAPT. No ischemia from Myoview from 08/2017. He never had chest pain prior to his PCI -he has been managed medically. He continues to do very well. No worrisome symptoms. Lab today. Encourage CV risk factor modification.   2. LV dysfunction - EF is 47% by prior cardiac MRI - on beta blocker therapy.He continues to do well clinically and notes no symptoms.  3.  Tobacco abuse - still smoking some - total cessation encouraged - this is hard for him.   4. HTN - BP is ok. No changes made today.   5. HLD - on statin - needs labs today.   6. MR - mild to moderate MR on his last echo - he has no murmur on exam - will hold on updating.   7. Psyche disorder -he seems to be back on his medicines. He is followed by [facility].Seems pretty good from this standpoint at this time.     Current medicines are reviewed with the patient today.  The patient does not have concerns regarding medicines other than what has been noted above.  The following changes have been made:  See above.  Labs/ tests ordered today include:   No orders of the defined types were placed in this encounter.    Disposition:   FU with *** in {gen number [id number] {Days to years:10300}.   Patient is agreeable to this plan and will call if any problems develop in the interim.   SignedTruitt Merle, NP  05/29/2020 9:29 AM  [facility] [address] [place], [place]  [address] Phone: [phone] Fax: [phone]

## 2020-05-17 ENCOUNTER — Ambulatory Visit (INDEPENDENT_AMBULATORY_CARE_PROVIDER_SITE_OTHER): Payer: Medicare HMO | Admitting: Physician Assistant

## 2020-05-17 ENCOUNTER — Encounter: Payer: Self-pay | Admitting: Physician Assistant

## 2020-05-17 ENCOUNTER — Other Ambulatory Visit: Payer: Self-pay

## 2020-05-17 DIAGNOSIS — F1991 Other psychoactive substance use, unspecified, in remission: Secondary | ICD-10-CM

## 2020-05-17 DIAGNOSIS — Z630 Problems in relationship with spouse or partner: Secondary | ICD-10-CM | POA: Diagnosis not present

## 2020-05-17 DIAGNOSIS — F5105 Insomnia due to other mental disorder: Secondary | ICD-10-CM | POA: Diagnosis not present

## 2020-05-17 DIAGNOSIS — F99 Mental disorder, not otherwise specified: Secondary | ICD-10-CM

## 2020-05-17 DIAGNOSIS — F411 Generalized anxiety disorder: Secondary | ICD-10-CM

## 2020-05-17 DIAGNOSIS — Z87898 Personal history of other specified conditions: Secondary | ICD-10-CM

## 2020-05-17 DIAGNOSIS — F319 Bipolar disorder, unspecified: Secondary | ICD-10-CM

## 2020-05-17 NOTE — Progress Notes (Signed)
[facility] Med Check  Patient ID: Derrick [name],  MRN: [id number]  PCP: [name], [name]  Date of Evaluation: 05/18/2020 Time spent:30 minutes  Chief Complaint:  Chief Complaint    Follow-up      HISTORY/CURRENT STATUS: HPI for routine med check.  He took a few puffs off of a joint from friend a few days ago.  States he was so relaxed that he even bought some.  And he has had fifth of whiskey that lasted about 4 days, for 3-4 bottles recently.  He said his anxiety has been really bad so he turned back to those old habits.  He was upfront with his wife Derrick [name] and told her everything even before she asked.  She got really angry with him and kept telling him that he was a drug addict and she did not want anything to do with him.  Patient states he is not planning to drink at all anymore.  He does not want to go back to that lifestyle.  He just wants to feel relief from the anxiety.  States he does not plan to smoke the pot he bought either.  He does not want to disappoint her, but he needs help with the panicky feelings he has on occasion.  It is not even daily and not a full-blown panic attack but he feels such a sense of unease within his body that he cannot calm down.  Other than that, he states he has been doing fine.  Patient denies loss of interest in usual activities and is able to enjoy things.  Denies decreased energy or motivation.  Appetite has not changed.  He does get really sad because of the way his wife is reacting.  Denies any changes in concentration, making decisions or remembering things.  Denies suicidal or homicidal thoughts.  Patient denies increased energy with decreased need for sleep, no increased talkativeness, no racing thoughts, no impulsivity or risky behaviors, no increased spending, no increased libido, no grandiosity.  Denies dizziness, syncope, seizures, numbness, tingling, tremor, tics, unsteady gait, slurred speech, confusion. Denies muscle or joint  pain, stiffness, or dystonia.  Individual Medical History/ Review of Systems: Changes? :No    Past medications for mental health diagnoses include: Zoloft, Prozac, EffexorXR, Lexapro, Depakote, Lamictal, Wellbutrin, Xanax, Ambien, Sonata, Klonopin   Allergies: Patient has no known allergies.  Current Medications:  Current Outpatient Medications:  .  acetaminophen (TYLENOL) 500 MG tablet, Take 500 mg by mouth every 6 (six) hours as needed for mild pain. , Disp: , Rfl:  .  aspirin 81 MG tablet, Take 81 mg by mouth daily., Disp: , Rfl:  .  atorvastatin (LIPITOR) 80 MG tablet, TAKE 1 TABLET EVERY DAY, Disp: 90 tablet, Rfl: 3 .  divalproex (DEPAKOTE) 500 MG DR tablet, TAKE 3 TABLETS (1,500 MG TOTAL) BY MOUTH AT BEDTIME., Disp: 270 tablet, Rfl: 1 .  escitalopram (LEXAPRO) 20 MG tablet, TAKE 1 TABLET EVERY DAY, Disp: 90 tablet, Rfl: 1 .  isosorbide mononitrate (IMDUR) 30 MG 24 hr tablet, Take 0.5 tablets (15 mg total) by mouth daily., Disp: 45 tablet, Rfl: 3 .  lamoTRIgine (LAMICTAL) 100 MG tablet, Take 1 tablet (100 mg total) by mouth daily. Patient has written instructions., Disp: 90 tablet, Rfl: 1 .  metoprolol tartrate (LOPRESSOR) 25 MG tablet, TAKE 1 AND 1/2 TABLETS TWICE DAILY, Disp: 270 tablet, Rfl: 3 .  montelukast (SINGULAIR) 10 MG tablet, TAKE 1 TABLET (10 MG TOTAL) BY MOUTH DAILY., Disp: 90 tablet, Rfl:  0 .  nitroGLYCERIN (NITROSTAT) 0.4 MG SL tablet, Place 1 tablet (0.4 mg total) under the tongue every 5 (five) minutes as needed for chest pain., Disp: 25 tablet, Rfl: 3 .  pantoprazole (PROTONIX) 40 MG tablet, Take 1 tablet (40 mg total) by mouth daily., Disp: 90 tablet, Rfl: 3 .  clonazePAM (KLONOPIN) 1 MG tablet, Take 1 mg by mouth as needed for anxiety., Disp: , Rfl:  .  sulfamethoxazole-trimethoprim (BACTRIM DS) 800-160 MG tablet, Take 1 tablet by mouth 2 (two) times daily. (Patient not taking: Reported on 02/15/2020), Disp: 20 tablet, Rfl: 0 .  tamsulosin (FLOMAX) 0.4 MG CAPS  capsule, TAKE 1 CAPSULE (0.4 MG TOTAL) BY MOUTH DAILY. FOR FLOW (Patient not taking: Reported on 02/15/2020), Disp: 30 capsule, Rfl: 11 .  zolpidem (AMBIEN CR) 12.5 MG CR tablet, Take 1 tablet (12.5 mg total) by mouth at bedtime as needed for sleep. (Patient not taking: Reported on 02/15/2020), Disp: 30 tablet, Rfl: 1  Current Facility-Administered Medications:  .  0.9 %  sodium chloride infusion, 500 mL, Intravenous, Once, Jackquline Denmark, MD Medication Side Effects: none  Family Medical/ Social History: Changes? No  MENTAL HEALTH EXAM:  There were no vitals taken for this visit.There is no height or weight on file to calculate BMI.  General Appearance: Casual, Neat and Well Groomed  Eye Contact:  Good  Speech:  Clear and Coherent and Normal Rate  Volume:  Normal  Mood:  Euthymic  Affect:  Appropriate  Thought Process:  Goal Directed and Descriptions of Associations: Intact  Orientation:  Full (Time, Place, and Person)  Thought Content: Logical   Suicidal Thoughts:  No  Homicidal Thoughts:  No  Memory:  WNL  Judgement:  Good  Insight:  Good  Psychomotor Activity:  Fidgety but normal for him.  Concentration:  Concentration: Good  Recall:  Good  Fund of Knowledge: Good  Language: Good  Assets:  Desire for Improvement  ADL's:  Intact  Cognition: WNL  Prognosis:  Good    DIAGNOSES:    ICD-10-CM   1. Generalized anxiety disorder  F41.1   2. Bipolar I disorder ([place])  F31.9   3. Insomnia due to other mental disorder  F51.05    F99   4. History of drug use disorder  Z87.898   5. Marital stress  Z63.0     Receiving Psychotherapy: Yes with Dr. [name]   RECOMMENDATIONS:  PDMP reviewed. I spent 30 minutes with him. We had a long talk about the recent alcohol and marijuana use.  He has been clean for many years until now.  Of course I do not condone this behavior and prefer him not to use any drugs at all.  Even though he does not think it will happen, he may begin to crave  these drugs and go back to using even harder drugs than these.  Patient states he does not plan to drink or use pot again.  His wife does not even want him to have anything for anxiety.  I had prescribed Klonopin rare use a year or so ago, and he took it responsibly, but his wife found out and was very upset so he stopped taking it because he did not want her to be upset.  I told him that Klonopin can be addictive but at least the dosage is known and there would be no possible contamination with other drugs as there possibly could be with marijuana.  Therefore I would prefer that he take a  benzo on occasion rather than using alcohol or marijuana to calm himself down.  He completely agrees but does not want to upset his wife.  If she would agree, I would not give him a large amount and I believe he would use it sparingly.  Patient states he does not think that she would go along with that.  At this point we decided to make no changes in his medications.  We might try another medication such as hydroxyzine, gabapentin, or choice of several others if we absolutely need to in the future.  Neither of [place] want to add another medication unless absolutely necessary. Continue Lamictal 100 mg, 1 p.o. daily. Continue Depakote 500 mg DR, 3 p.o. nightly. Continue Lexapro 20 mg 1 p.o. daily. Continue therapy with Dr. [name]. Return in 3 months.  Donnal Moat, [name]

## 2020-05-23 ENCOUNTER — Ambulatory Visit (INDEPENDENT_AMBULATORY_CARE_PROVIDER_SITE_OTHER): Payer: Medicare HMO | Admitting: Licensed Clinical Social Worker

## 2020-05-23 DIAGNOSIS — F3170 Bipolar disorder, currently in remission, most recent episode unspecified: Secondary | ICD-10-CM

## 2020-05-23 DIAGNOSIS — G4733 Obstructive sleep apnea (adult) (pediatric): Secondary | ICD-10-CM | POA: Diagnosis not present

## 2020-05-23 DIAGNOSIS — R413 Other amnesia: Secondary | ICD-10-CM

## 2020-05-23 DIAGNOSIS — E7800 Pure hypercholesterolemia, unspecified: Secondary | ICD-10-CM | POA: Diagnosis not present

## 2020-05-23 DIAGNOSIS — G4700 Insomnia, unspecified: Secondary | ICD-10-CM

## 2020-05-23 NOTE — Chronic Care Management (AMB) (Signed)
Chronic Care Management    Clinical Social Work Follow Up Note  05/23/2020 Name: Derrick [name] MRN: [id number] DOB: [birth year]  Derrick [name] is a 64 y.o. year old male who is a primary care patient of Dettinger, Ivin Booty MD,. The CCM team was consulted for assistance with [employer] .   Review of patient status, including review of consultants reports, other relevant assessments, and collaboration with appropriate care team members and the patient's provider was performed as part of comprehensive patient evaluation and provision of chronic care management services.    SDOH (Social Determinants of Health) assessments performed: No; risk for tobacco use; risk for depression    Chronic Care Management from 04/18/2020 in [facility]  PHQ-9 Total Score  6     GAD 7 : Generalized Anxiety Score 04/18/2020  Nervous, Anxious, on Edge 1  Control/stop worrying 0  Worry too much - different things 0  Trouble relaxing 1  Restless 1  Easily annoyed or irritable 0  Afraid - awful might happen 0  Total GAD 7 Score 3  Anxiety Difficulty Somewhat difficult    Outpatient Encounter Medications as of 05/23/2020  Medication Sig  . acetaminophen (TYLENOL) 500 MG tablet Take 500 mg by mouth every 6 (six) hours as needed for mild pain.   [name] aspirin 81 MG tablet Take 81 mg by mouth daily.  [name] atorvastatin (LIPITOR) 80 MG tablet TAKE 1 TABLET EVERY DAY  . divalproex (DEPAKOTE) 500 MG DR tablet TAKE 3 TABLETS (1,500 MG TOTAL) BY MOUTH AT BEDTIME.  [name] escitalopram (LEXAPRO) 20 MG tablet TAKE 1 TABLET EVERY DAY  . isosorbide mononitrate (IMDUR) 30 MG 24 hr tablet Take 0.5 tablets (15 mg total) by mouth daily.  [name] lamoTRIgine (LAMICTAL) 100 MG tablet Take 1 tablet (100 mg total) by mouth daily. Patient has written instructions.  . metoprolol tartrate (LOPRESSOR) 25 MG tablet TAKE 1 AND 1/2 TABLETS TWICE DAILY  . montelukast (SINGULAIR) 10 MG tablet TAKE 1 TABLET (10 MG TOTAL) BY MOUTH  DAILY.  . nitroGLYCERIN (NITROSTAT) 0.4 MG SL tablet Place 1 tablet (0.4 mg total) under the tongue every 5 (five) minutes as needed for chest pain.  . pantoprazole (PROTONIX) 40 MG tablet Take 1 tablet (40 mg total) by mouth daily.  [name] sulfamethoxazole-trimethoprim (BACTRIM DS) 800-160 MG tablet Take 1 tablet by mouth 2 (two) times daily. (Patient not taking: Reported on 02/15/2020)  . tamsulosin (FLOMAX) 0.4 MG CAPS capsule TAKE 1 CAPSULE (0.4 MG TOTAL) BY MOUTH DAILY. FOR FLOW (Patient not taking: Reported on 02/15/2020)   Facility-Administered Encounter Medications as of 05/23/2020  Medication  . 0.9 %  sodium chloride infusion    Goals Addressed            This Visit's Progress   . Client will talk with LCSW in next 30 days to discuss anxiety or stress issues of client (pt-stated)       CARE PLAN ENTRY   Current Barriers:  [name] Mental Health issues in client with chronic diagnoses of OSA, Insomnia, Bipolar  disorder, Anxiety disorder, Dyspnea, Memory loss . OSA  Clinical Social Work Clinical Goal(s):  [name] LCSW will call client in next 30 days to discuss anxiety or stress issues of client  Interventions: . Talked with client about CCM program support  . Talked with client about sleeping issues of client . Talked with client about medication procurement of client . Talked with client about relaxation techniques (watches TV, enjoys walking) . Talked  with client about mental health support at [facility] in [place], [place] . Talked with client about social support network (wife is supportive,granddaughter is supportive) . Talked with client about his medical appointments (said he sees cardiologist  every 6 months) . Talked with client about decreased energy (takes rest breaks as needed) . Talked with client about health needs in his family  . Talked with client about managing anxiety issues of client . Talked with client about alcohol use of client  Patient Self Care  Activities:  Does ADLs indenpendently Drives to appointments  Patient Self Care Deficits:   [name] Mental health issues  Initial goal documentation .,     Follow Up Plan:  LCSW to call client in next 4 weeks to talk with client about client management of anxiety and depression issues faced  Kelton Pillar.Machelle Raybon MSW, LCSW Licensed Clinical Social Worker [facility] Care Management [phone]

## 2020-05-23 NOTE — Patient Instructions (Addendum)
Licensed Clinical Social Worker Visit Information  Goals we discussed today:  Goals Addressed            This Visit's Progress   . Client will talk with LCSW in next 30 days to discuss anxiety or stress issues of client (pt-stated)       CARE PLAN ENTRY   Current Barriers:  [name] Mental Health issues in client with chronic diagnoses of OSA, Insomnia, Bipolar  disorder, Anxiety disorder, Dyspnea, Memory loss . OSA  Clinical Social Work Clinical Goal(s):  [name] LCSW will call client in next 30 days to discuss anxiety or stress issues of client  Interventions: . Talked with client about CCM program support  . Talked with client about sleeping issues of client . Talked with client about medication procurement of client . Talked with client about relaxation techniques (watches TV, enjoys walking) . Talked with client about mental health support at [facility] in [place], [place] . Talked with client about social support network (wife is supportive,granddaughter is supportive) . Talked with client about his medical appointments (said he sees cardiologist  every 6 months) . Talked with client about decreased energy (takes rest breaks as needed)   Talked with client about health needs in his family   Talked with client about managing anxiety issues of client . Talked with client about alcohol use of client  Patient Self Care Activities:  Does ADLs indenpendently Drives to appointments  Patient Self Care Deficits:   [name] Mental health issues  Initial goal documentation .      Materials Provided: No  Follow Up Plan: LCSW to call client in next 4 weeks to talk with client about client management of anxiety and depression issues faced  The patient verbalized understanding of instructions provided today and declined a print copy of patient instruction materials.   [name].Kerstin Crusoe MSW, LCSW Licensed Clinical Social Worker [facility] Care Management [phone]

## 2020-05-30 ENCOUNTER — Ambulatory Visit: Payer: Medicare HMO | Admitting: Nurse Practitioner

## 2020-05-31 ENCOUNTER — Telehealth: Payer: Medicare HMO

## 2020-06-08 ENCOUNTER — Ambulatory Visit (INDEPENDENT_AMBULATORY_CARE_PROVIDER_SITE_OTHER): Payer: Medicare HMO | Admitting: Psychiatry

## 2020-06-08 DIAGNOSIS — Z630 Problems in relationship with spouse or partner: Secondary | ICD-10-CM | POA: Diagnosis not present

## 2020-06-08 DIAGNOSIS — F1011 Alcohol abuse, in remission: Secondary | ICD-10-CM

## 2020-06-08 DIAGNOSIS — Z87898 Personal history of other specified conditions: Secondary | ICD-10-CM

## 2020-06-08 DIAGNOSIS — F1210 Cannabis abuse, uncomplicated: Secondary | ICD-10-CM | POA: Diagnosis not present

## 2020-06-08 DIAGNOSIS — F902 Attention-deficit hyperactivity disorder, combined type: Secondary | ICD-10-CM

## 2020-06-08 DIAGNOSIS — F319 Bipolar disorder, unspecified: Secondary | ICD-10-CM | POA: Diagnosis not present

## 2020-06-08 DIAGNOSIS — Z636 Dependent relative needing care at home: Secondary | ICD-10-CM | POA: Diagnosis not present

## 2020-06-08 DIAGNOSIS — F411 Generalized anxiety disorder: Secondary | ICD-10-CM

## 2020-06-08 DIAGNOSIS — F1991 Other psychoactive substance use, unspecified, in remission: Secondary | ICD-10-CM

## 2020-06-08 NOTE — Progress Notes (Signed)
Psychotherapy Progress Note [facility] [name], PhD LP  Patient ID: Derrick [name]     MRN: [id number] Therapy format: Individual psychotherapy Date: 06/08/2020      Start: 2:10p     Stop: 2:50p     Time Spent: 40 min Location: Telehealth visit -- I connected with this patient by an approved telecommunication method (audio only), with his informed consent, and verifying identity and patient privacy.  I was located at my office and patient at his home.  As needed, we discussed the limitations, risks, and security and privacy concerns associated with telehealth service, including the availability and conditions which currently govern in-person appointments and the possibility that 3rd-party payment may not be fully guaranteed and he may be responsible for charges.  After he indicated understanding, we proceeded with the session.  Also discussed treatment planning, as needed, including ongoing verbal agreement with the plan, the opportunity to ask and answer all questions, his demonstrated understanding of instructions, and his readiness to call the office should symptoms worsen or he feels he is in a crisis state and needs more immediate and tangible assistance.   Session narrative (presenting needs, interim history, self-report of stressors and symptoms, applications of prior therapy, status changes, and interventions made in session) See psychiatry note 6/3 -- Pt relapsed in [place] and EtOH, both using and purchasing, came clean with his wife before being questioned, and took criticism for it (inevitable, given remote history of chronic substance abuse problems and the sheer amount life disappointment [name] has been through between her own [place] concerns, PT's behavioral history, disability, and loss of their elder daughter to cancer.  PT sought more help with anxiety episodes, which certainly could be manic or rage urges, given PT's history and current stresses with his surviving  daughter relapsing in anorexia.  Discussion had a bout long-acting benzodiazepine, which wife has been against even medically supervised use for several years.  Decision made not to prescribe anything further at this point for anxiety and work with it behaviorally.  "Drastic change -- I started smokin' reefuh again."  Saw his friend/neighbor, who whipped out a joint, smoked it, felt the best he has in a long time.  Neighbor gave him $100 for helping him out, spent on weed supply of his own with [place].  He announced it, didn't try to hide it, represented to wife how much it helped, she threatened to leave him, told him he's "always been a drug addict", threatened to leave him, and she charged that he was still an addict for using the drugs Dr. Candis Schatz gave him.  She wrote him a letter and left it on the table, he wrote one back.  No comment for a couple days.  Admits he is smoking still, a few puffs, not a whole joint, and it helps him slow his mind.  Usually smoking only after she goes to bed, but today during the day.  His letter represented his need to see people rather than sit around idle while she withdraws into the tablet.  Says W mellowed after seeing her own psychiatrist, and he feels his speaking up for something to relax him was effective, though he basically threatened her it would be mild MJ use or 4 bottles of liquor and alluded to suicide.    Confronted his strongarm tactics and encouraged him to take a kinder line -- stop emphasizing what he needs and what he's going to do regardless and make sure [name] can  hear him understanding that it's not just about what he wants/needs, it's also that he gets it she's afraid he'll go back to the old days (she said it, for one thing), and he has his own plan/intentions to make sure that doesn't come true (whatever that may be -- for now, it's just use a couple puffs and control it).  Reminded him that his brain did not forget how to be addicted, and he  will be at higher risk of crossing back over in a hurry to his old ways, so he will need to be clear himself what his warning signs are ready to stop cold.  Also notes [name] is worse off still.  Saw her for Father's Day, and she is starting to look emaciated, even though she claims (delusionally) to have gained weight.  Son-in-law Herbie Baltimore still doesn't seem to be able to be assertive enough with it.  Encouraged go ahead and try to get an alliance with him so he can draw on the strength of the family group when the time comes to intervene.  Says the family have all come under the control of her obsessions with food, and that she told [name] not to come over at 5pm "because it's when I eat" -- practically a confession that she is in the grip of anorexia and possible that that is when she purges also.  Support/empathy provided.   Therapeutic modalities: Cognitive Behavioral Therapy, Solution-Oriented/Positive Psychology and Motivational Interviewing  Mental Status/Observations:  Appearance:   Not assessed     Behavior:  Appropriate  Motor:  Not assessed  Speech/Language:   Clear and Coherent  Affect:  Not assessed  Mood:  normal and more at ease (admits has had MJ today) -- not labile or over-tense  Thought process:  normal and except noted tendency to black-and-white thinking  Thought content:    WNL and worries  Sensory/Perceptual disturbances:    WNL  Orientation:  Fully oriented  Attention:  Good    Concentration:  Good  Memory:  grossly intact  Insight:    Fair  Judgment:   Fair  Impulse Control:  Fair   Risk Assessment: Danger to Self: No Self-injurious Behavior: No Danger to Others: No Physical Aggression / Violence: No Duty to Warn: No Access to Firearms a concern: No  Assessment of progress:  stabilized  Diagnosis:   ICD-10-CM   1. Bipolar I disorder ([place])  F31.9   2. Generalized anxiety disorder  F41.1   3. Cannabis use disorder, mild, abuse  F12.10   4. Marital stress   Z63.0   5. Attention deficit hyperactivity disorder (ADHD), combined type  F90.2   6. Caregiver stress  Z63.6    Plan:  . Much prefer marijuana abstinence, but Pt will use at his own risk.  Pledge to maintain in small amounts, not enough to get high, and charged to identify what his own warning signs would be that the drug is taking over or trying to get out of hand . Make a decided effort to demonstrate empathy about wife's concern that he will relapse and go back to unwanted behavior -- specifically let her know he thought about it, realizes that even though she sounded angry before, the important part is that she's afraid and it's OK if she feels that.  If possible, offer notions about how he will protect her from having to worry about that.  Try to be done talking about his own need to see people and the complaint  that things are dull and isolated at home.  Complain less, recognize her feelings more without having to agree or thinking he has to "knuckle under". . Consider asking son-in-law to compare notes on [name] and see what they would be willing to confront together.  If need referrals for EDO interventionist, ask. . Other recommendations/advice as may be noted above . Continue to utilize previously learned skills ad lib . Maintain medication as prescribed and work faithfully with relevant prescriber(s) if any changes are desired or seem indicated . Call the clinic on-call service, present to ER, or call 911 if any life-threatening psychiatric crisis Return in about 1 month (around 07/08/2020) for available earlier @ PT's need. . Already scheduled visit in this office 08/15/2020.  [name], PhD [name], PhD LP Clinical Psychologist, [facility] [address] [place], [place] [address] [phone]

## 2020-06-27 ENCOUNTER — Telehealth: Payer: Self-pay

## 2020-07-03 NOTE — Progress Notes (Signed)
CARDIOLOGY OFFICE NOTE  Date:  07/11/2020    Derrick [name] Date of Birth: 04-12-1956 Medical Record #[id number]  PCP:  [name], [name]  Cardiologist:  Servando Snare    Chief Complaint  Patient presents with  . Follow-up    History of Present Illness: Derrick [name] is a 64 y.o. male who presents today for a follow up visit. Seen for Dr. [name].Primarily follows with me.  He has a history of CAD s/p inferior STEMI with DES x 2 to RCA in 2004 and mild to moderate MR with mitral valve prolapse. His other issues include tobacco abuse, bipolar disorder, OSA and HLD.He smokes 2 cigs per day and has done so for many years.  Echo from 11/2015with EF of 45 to 50%, moderateLVH, MVP with mild to moderate MR.  Seen for first time by Dr. [name] back in 2014. Cardiac status appeared stable but was short of breath - ordered a Myoview, PFTs and BNP. BNP was 18. Low risk Myoview noted. PFTs without marked abnormality.   I have seen him back over the past few years - I saw him back in January of 2018 - had had some eye surgery and was told he had a "skip" in his heart. He was asymptomatic. Had been out of his beta blocker for at least 6 months - I restarted it. Got his echo updated - EF down more - reviewed with Dr. [name] - set up for Myoview - larger scar noted. Cardiac cath recommended - ended up having PCI.Plan was to update his echo 3 months post PCI.   I then saw him back in Juneof 2018- some "indigestion" reported - but had used NTG with relief.On follow up back in September he had continued to endorse some chest pain. Had been using NTG. Myoview updated- see below.Ended up getting cardiac MRI as well and EF is 47%.Admitsthat he could do better with his CV risk factor modification.When seenback in October of 2018- doing quite well - no chest pain. Had stopped his Lamictal and Lexapro. He was to follow up with psycheand ended upback on his psyche meds. Still smoking  about 2 cigs per day.Last echo with just mild MR noted.   When seen last summer, he was doing ok but the pandemic had been hard on him and his psyche. Smoking a little more. Spending time outside. Cardiac status felt to be ok. I last saw him in December.   Comes in today. Here alone. He is doing well. He likes to stay outside. Just returned from the beach. He is quite suntanned. No chest pain. Not short of breath unless he really over exerts himself. Not dizzy or lightheaded. He is mowing his and his sister's yard - no issue. Paces himself with the weed eating - and does fine as long as he paces himself. He has no real concerns. Does need NTG refilled - not using any.   Past Medical History:  Diagnosis Date  . Anxiety   . Bipolar 1 disorder ([place])   . Bipolar disorder ([place]) 10/05/2009   Qualifier: Diagnosis of  By: [name]    . BPH (benign prostatic hypertrophy)   . CAD (coronary artery disease), native coronary artery    a. DES x 2 to RCA in 2004 b. 02/11/17 PCI w/DES x1 to mRCA  . Coronary atherosclerosis 10/05/2009   Qualifier: Diagnosis of  By: [name]   ANGIOGRAPHIC DATA:  1. Left main coronary artery was free of  critical disease.  2. The LAD coursed to the apex. There is mild luminal irregularity  including mild irregularity of the diagonal. No high grade areas of  stenosis were noted. There was faint collateralization of the distal  right coronary circulation.  3. The circumflex provided two major m  . Depression   . Dyspnea 09/20/2012  . Essential and other specified forms of tremor 02/05/2013  . HYPERCHOLESTEROLEMIA 10/05/2009   Qualifier: Diagnosis of  By: [name]    . Hyperlipidemia   . Memory loss 02/05/2013  . Mitral regurgitation and aortic stenosis   . MITRAL STENOSIS/ INSUFFICIENCY, NON-RHEUMATIC 01/06/2011   Qualifier: Diagnosis of  By: [name], MD, [name] Study Conclusions  - Left ventricle: The cavity size was normal. Wall thickness was  increased in a pattern of mild LVH. Systolic function was normal. The estimated ejection fraction was in the range of 55% to 60%. Wall motion was normal; there were no regional wall motion abnormalities. Left ventricular diastolic function parameters were  . MVP (mitral valve prolapse)   . Myocardial infarction ([place])   . Obstructive sleep apnea 02/05/2013  . OSA (obstructive sleep apnea) 03/22/2013  . Parasomnia 02/05/2013  . Sleep apnea   . Sleep disorder with cognitive complaints 10/26/2018  . Unspecified hereditary and idiopathic peripheral neuropathy 02/05/2013    Past Surgical History:  Procedure Laterality Date  . CORONARY STENT INTERVENTION N/A 02/12/2017   Procedure: Coronary Stent Intervention;  Surgeon: [name], MD;  Location: [facility];  Service: Cardiovascular;  Laterality: N/A;  . LEFT HEART CATH AND CORONARY ANGIOGRAPHY N/A 02/12/2017   Procedure: Left Heart Cath and Coronary Angiography;  Surgeon: [name], MD;  Location: [facility];  Service: Cardiovascular;  Laterality: N/A;     Medications: Current Meds  Medication Sig  . acetaminophen (TYLENOL) 500 MG tablet Take 500 mg by mouth every 6 (six) hours as needed for mild pain.   [name] aspirin 81 MG tablet Take 81 mg by mouth daily.  [name] atorvastatin (LIPITOR) 80 MG tablet TAKE 1 TABLET EVERY DAY  . divalproex (DEPAKOTE) 500 MG DR tablet TAKE 3 TABLETS (1,500 MG TOTAL) BY MOUTH AT BEDTIME.  [name] escitalopram (LEXAPRO) 20 MG tablet TAKE 1 TABLET EVERY DAY  . isosorbide mononitrate (IMDUR) 30 MG 24 hr tablet Take 0.5 tablets (15 mg total) by mouth daily.  [name] lamoTRIgine (LAMICTAL) 100 MG tablet Take 1 tablet (100 mg total) by mouth daily. Patient has written instructions.  . metoprolol tartrate (LOPRESSOR) 25 MG tablet TAKE 1 AND 1/2 TABLETS TWICE DAILY  . montelukast (SINGULAIR) 10 MG tablet TAKE 1 TABLET (10 MG TOTAL) BY MOUTH DAILY.  . nitroGLYCERIN (NITROSTAT) 0.4 MG SL tablet Place 1 tablet (0.4 mg total)  under the tongue every 5 (five) minutes as needed for chest pain.  . pantoprazole (PROTONIX) 40 MG tablet Take 1 tablet (40 mg total) by mouth daily.  . [DISCONTINUED] nitroGLYCERIN (NITROSTAT) 0.4 MG SL tablet Place 1 tablet (0.4 mg total) under the tongue every 5 (five) minutes as needed for chest pain.   Current Facility-Administered Medications for the 07/11/20 encounter (Office Visit) with Burtis Junes, NP  Medication  . 0.9 %  sodium chloride infusion     Allergies: No Known Allergies  Social History: The patient  reports that he quit smoking about 17 years ago. His smoking use included cigarettes. He smoked 0.10 packs per day. He has never used smokeless tobacco. He reports current alcohol  use. He reports that he does not use drugs.   Family History: The patient's family history includes COPD in his father; Colon cancer in his sister; Heart disease in his brother and mother.   Review of Systems: Please see the history of present illness.   All other systems are reviewed and negative.   Physical Exam: VS:  BP 100/72   Pulse 74   Ht 5\' 11"  (1.803 m)   Wt 181 lb 6.4 oz (82.3 kg)   SpO2 97%   BMI 25.30 kg/m  .  BMI Body mass index is 25.3 kg/m.  Wt Readings from Last 3 Encounters:  07/11/20 181 lb 6.4 oz (82.3 kg)  11/29/19 182 lb (82.6 kg)  09/06/19 177 lb 6.4 oz (80.5 kg)    General: Pleasant. Alert and in no acute distress.   Cardiac: Regular rate and rhythm. No real murmur noted on exam. No edema.  Respiratory:  Lungs are clear to auscultation bilaterally with normal work of breathing.  GI: Soft and nontender.  MS: No deformity or atrophy. Gait and ROM intact.  Skin: Warm and dry. Color is normal. He is quite suntanned.  Neuro:  Strength and sensation are intact and no gross focal deficits noted.  Psych: Alert, appropriate and with normal affect.   LABORATORY DATA:  EKG:  EKG is not ordered today.    Lab Results  Component Value Date   WBC 6.4  11/29/2019   HGB 15.1 11/29/2019   HCT 43.7 11/29/2019   PLT 178 11/29/2019   GLUCOSE 99 11/29/2019   CHOL 132 11/29/2019   TRIG 116 11/29/2019   HDL 37 (L) 11/29/2019   LDLCALC 74 11/29/2019   ALT 16 11/29/2019   AST 17 11/29/2019   NA 142 11/29/2019   K 4.6 11/29/2019   CL 101 11/29/2019   CREATININE 0.87 11/29/2019   BUN 10 11/29/2019   CO2 25 11/29/2019   TSH 3.360 07/19/2014   INR 1.0 02/09/2017     BNP (last 3 results) No results for input(s): BNP in the last 8760 hours.  ProBNP (last 3 results) No results for input(s): PROBNP in the last 8760 hours.   Other Studies Reviewed Today:  ECHO Study Conclusions 09/2018  - Left ventricle: The cavity size was normal. Systolic function was  normal. The estimated ejection fraction was in the range of 50%  to 55%. Wall motion was normal; there were no regional wall  motion abnormalities. Doppler parameters are consistent with  abnormal left ventricular relaxation (grade 1 diastolic  dysfunction). Doppler parameters are consistent with  indeterminate ventricular filling pressure.  - Aortic valve: Transvalvular velocity was within the normal range.  There was no stenosis. There was no regurgitation.  - Mitral valve: Mild prolapse, involving the anterior leaflet and  the posterior leaflet. Transvalvular velocity was within the  normal range. There was no evidence for stenosis. There was mild  regurgitation.  - Left atrium: The atrium was moderately dilated.  - Right ventricle: The cavity size was normal. Wall thickness was  normal. Systolic function was normal.  - Atrial septum: No defect or patent foramen ovale was identified.  - Tricuspid valve: There was trivial regurgitation.  - Pulmonary arteries: Systolic pressure was within the normal  range. PA peak pressure: 19 mm Hg (S).   ------------------------------------------------------------------- CARDIAC MRIIMPRESSION11/2018: 1. Normal LV  size with wall motion abnormalities noted above. EF 47%.Delayed enhancement images suggestive of prior infarction in the RCA territory.  2. Normal RV size and  systolic function.  3. Mitral valve prolapse with mild to moderate mitral regurgitation.  Electronically Signed By: [name] M.D. On: 10/23/2017 16:43  MyoviewStudy Highlights9/2018   Nuclear stress EF: 37%.  Blood pressure demonstrated a normal response to exercise.  There was no ST segment deviation noted during stress.  No T wave inversion was noted during stress.  Defect 1: There is a small defect of moderate severity present in the basal inferior and mid inferior location.  Findings consistent with prior myocardial infarction. No ischemia.  The left ventricular ejection fraction is moderately decreased (30-44%).    Notes recorded by Burtis Junes, NP on 09/01/2017 at 7:51 AM EDT Ok to report. Dr. [name] has reviewed his Myoview - EF while low - same as prior study - would like to see back in about a month to recheck him. May consider cardiac MRI to get a better sense of his pumping function. For now, stay on current regimen as outlined at his visit.    Coronary Stent Intervention 02/2017  Conclusion     Mid RCA lesion, 90 %stenosed.  A STENT RESOLUTE ONYX 4.0X12 drug eluting stent was successfully placed.  Prox RCA to Mid RCA lesion, 90 %stenosed.  Post intervention, there is a 0% residual stenosis.  Successful stenting of the mid RCA with DES for in stent restenosis.  Plan: DAPT for one year with ASA and Plavix. May be suitable for same day discharge.     Assessment/Plan:  1. CAD - s/p PCI to RCA from March of 2018 - has completed DAPT - no ischemia on Myoview from 2018. No actual chest pain prior to his PCI. Continues to be managed medically. No worrisome issues/concerns.  He is on long acting nitrate therapy. Sl NTG refilled for him today.   2. HTN - BP is great  on his current regimen. Reminded to stay hydrated. We will continue with his current regimen of Metoprolol.   3. Tobacco abuse - total cessation encouraged.   4. Bipolar disorder - per Ps[facility] at [facility].   5. HLD - on high intensity statin - needs labs today - would continue Lipitor.   6. Prior LV dysfunction - EF of 47% by prior cardiac MRI - this has normalized by echo from 2019.   7. MVP - mild MR - would follow. No significant murmur on exam.   Current medicines are reviewed with the patient today.  The patient does not have concerns regarding medicines other than what has been noted above.  The following changes have been made:  See above.  Labs/ tests ordered today include:    Orders Placed This Encounter  Procedures  . Basic metabolic panel  . CBC  . Hepatic function panel  . Lipid panel     Disposition:   FU with me in 6 months.   Patient is agreeable to this plan and will call if any problems develop in the interim.   SignedTruitt Merle, NP  07/11/2020 3:25 PM  [facility] [address] [place], [place]  [address] Phone: [phone] Fax: [phone]

## 2020-07-11 ENCOUNTER — Encounter: Payer: Self-pay | Admitting: Nurse Practitioner

## 2020-07-11 ENCOUNTER — Ambulatory Visit: Payer: Medicare HMO | Admitting: Nurse Practitioner

## 2020-07-11 ENCOUNTER — Other Ambulatory Visit: Payer: Self-pay

## 2020-07-11 VITALS — BP 100/72 | HR 74 | Ht 71.0 in | Wt 181.4 lb

## 2020-07-11 DIAGNOSIS — I259 Chronic ischemic heart disease, unspecified: Secondary | ICD-10-CM

## 2020-07-11 DIAGNOSIS — E7800 Pure hypercholesterolemia, unspecified: Secondary | ICD-10-CM | POA: Diagnosis not present

## 2020-07-11 DIAGNOSIS — I5022 Chronic systolic (congestive) heart failure: Secondary | ICD-10-CM

## 2020-07-11 MED ORDER — NITROGLYCERIN 0.4 MG SL SUBL: 0.4000 mg | SUBLINGUAL_TABLET | SUBLINGUAL | 3 refills | Status: DC | PRN

## 2020-07-11 NOTE — Patient Instructions (Addendum)
After Visit Summary:  We will be checking the following labs today - BMET, CBC, HPF, and Lipids   Medication Instructions:    Continue with your current medicines.   I refilled your NTG today   If you need a refill on your cardiac medications before your next appointment, please call your pharmacy.     Testing/Procedures To Be Arranged:  N/A  Follow-Up:   See me in 6 months    At [facility], you and your health needs are our priority.  As part of our continuing mission to provide you with exceptional heart care, we have created designated Provider Care Teams.  These Care Teams include your primary Cardiologist (physician) and Advanced Practice Providers (APPs -  Physician Assistants and Nurse Practitioners) who all work together to provide you with the care you need, when you need it.  Special Instructions:  . Stay safe, wash your hands for at least 20 seconds and wear a mask when needed.  . It was good to talk with you today.    Call the [facility] office at [phone] if you have any questions, problems or concerns.

## 2020-07-12 ENCOUNTER — Telehealth: Payer: Self-pay | Admitting: Nurse Practitioner

## 2020-07-12 ENCOUNTER — Other Ambulatory Visit: Payer: Self-pay | Admitting: *Deleted

## 2020-07-12 DIAGNOSIS — E7800 Pure hypercholesterolemia, unspecified: Secondary | ICD-10-CM

## 2020-07-12 LAB — CBC
Hematocrit: 40.2 % (ref 37.5–51.0)
Hemoglobin: 13.9 g/dL (ref 13.0–17.7)
MCH: 32.6 pg (ref 26.6–33.0)
MCHC: 34.6 g/dL (ref 31.5–35.7)
MCV: 94 fL (ref 79–97)
Platelets: 213 10*3/uL (ref 150–450)
RBC: 4.27 x10E6/uL (ref 4.14–5.80)
RDW: 12.7 % (ref 11.6–15.4)
WBC: 7.9 10*3/uL (ref 3.4–10.8)

## 2020-07-12 LAB — LIPID PANEL
Chol/HDL Ratio: 4.3 ratio (ref 0.0–5.0)
Cholesterol, Total: 150 mg/dL (ref 100–199)
HDL: 35 mg/dL — ABNORMAL LOW (ref >39)
LDL Chol Calc (NIH): 96 mg/dL (ref 0–99)
Triglycerides: 99 mg/dL (ref 0–149)
VLDL Cholesterol Cal: 19 mg/dL (ref 5–40)

## 2020-07-12 LAB — HEPATIC FUNCTION PANEL
ALT: 14 IU/L (ref 0–44)
AST: 12 IU/L (ref 0–40)
Albumin: 4.6 g/dL (ref 3.8–4.8)
Alkaline Phosphatase: 59 IU/L (ref 48–121)
Bilirubin Total: 0.4 mg/dL (ref 0.0–1.2)
Bilirubin, Direct: 0.1 mg/dL (ref 0.00–0.40)
Total Protein: 6.4 g/dL (ref 6.0–8.5)

## 2020-07-12 LAB — BASIC METABOLIC PANEL
BUN/Creatinine Ratio: 14 (ref 10–24)
BUN: 13 mg/dL (ref 8–27)
CO2: 24 mmol/L (ref 20–29)
Calcium: 9.5 mg/dL (ref 8.6–10.2)
Chloride: 104 mmol/L (ref 96–106)
Creatinine, Ser: 0.94 mg/dL (ref 0.76–1.27)
GFR calc Af Amer: 100 mL/min/{1.73_m2} (ref >59)
GFR calc non Af Amer: 87 mL/min/{1.73_m2} (ref >59)
Glucose: 98 mg/dL (ref 65–99)
Potassium: 4.8 mmol/L (ref 3.5–5.2)
Sodium: 141 mmol/L (ref 134–144)

## 2020-07-12 MED ORDER — EZETIMIBE 10 MG PO TABS
10.0000 mg | ORAL_TABLET | Freq: Every day | ORAL | 3 refills | Status: DC
Start: 2020-07-12 — End: 2021-02-14

## 2020-07-12 NOTE — Telephone Encounter (Signed)
Called pt back no vm.  Will try later.

## 2020-07-12 NOTE — Telephone Encounter (Signed)
Patient is returning call to discuss results from lab work completed on 07/11/20.

## 2020-07-16 NOTE — Telephone Encounter (Signed)
An answer or vm. Will wait for pt to call back.

## 2020-07-31 ENCOUNTER — Ambulatory Visit (INDEPENDENT_AMBULATORY_CARE_PROVIDER_SITE_OTHER): Payer: Medicare HMO | Admitting: Licensed Clinical Social Worker

## 2020-07-31 DIAGNOSIS — F5105 Insomnia due to other mental disorder: Secondary | ICD-10-CM

## 2020-07-31 DIAGNOSIS — G4733 Obstructive sleep apnea (adult) (pediatric): Secondary | ICD-10-CM | POA: Diagnosis not present

## 2020-07-31 DIAGNOSIS — R413 Other amnesia: Secondary | ICD-10-CM

## 2020-07-31 DIAGNOSIS — F3170 Bipolar disorder, currently in remission, most recent episode unspecified: Secondary | ICD-10-CM

## 2020-07-31 DIAGNOSIS — E7800 Pure hypercholesterolemia, unspecified: Secondary | ICD-10-CM | POA: Diagnosis not present

## 2020-07-31 DIAGNOSIS — F99 Mental disorder, not otherwise specified: Secondary | ICD-10-CM

## 2020-07-31 NOTE — Chronic Care Management (AMB) (Signed)
Chronic Care Management    Clinical Social Work Follow Up Note  07/31/2020 Name: KAAMIL REINA MRN: [id number] DOB: 1956/07/20  FINNICK PERRILLOUX is a 64 y.o. year old male who is a primary care patient of Dettinger, Elige Radon MD. The CCM team was consulted for assistance with [employer] .   Review of patient status, including review of consultants reports, other relevant assessments, and collaboration with appropriate care team members and the patient's provider was performed as part of comprehensive patient evaluation and provision of chronic care management services.    SDOH (Social Determinants of Health) assessments performed: No;risk for tobacco use; risk for depression; risk for stress    Chronic Care Management from 04/18/2020 in [facility]  PHQ-9 Total Score 6       GAD 7 : Generalized Anxiety Score 04/18/2020  Nervous, Anxious, on Edge 1  Control/stop worrying 0  Worry too much - different things 0  Trouble relaxing 1  Restless 1  Easily annoyed or irritable 0  Afraid - awful might happen 0  Total GAD 7 Score 3  Anxiety Difficulty Somewhat difficult    Outpatient Encounter Medications as of 07/31/2020  Medication Sig  . acetaminophen (TYLENOL) 500 MG tablet Take 500 mg by mouth every 6 (six) hours as needed for mild pain.   [name] aspirin 81 MG tablet Take 81 mg by mouth daily.  [name] atorvastatin (LIPITOR) 80 MG tablet TAKE 1 TABLET EVERY DAY  . divalproex (DEPAKOTE) 500 MG DR tablet TAKE 3 TABLETS (1,500 MG TOTAL) BY MOUTH AT BEDTIME.  [name] escitalopram (LEXAPRO) 20 MG tablet TAKE 1 TABLET EVERY DAY  . ezetimibe (ZETIA) 10 MG tablet Take 1 tablet (10 mg total) by mouth daily.  . isosorbide mononitrate (IMDUR) 30 MG 24 hr tablet Take 0.5 tablets (15 mg total) by mouth daily.  [name] lamoTRIgine (LAMICTAL) 100 MG tablet Take 1 tablet (100 mg total) by mouth daily. Patient has written instructions.  . metoprolol tartrate (LOPRESSOR) 25 MG tablet TAKE 1 AND 1/2  TABLETS TWICE DAILY  . montelukast (SINGULAIR) 10 MG tablet TAKE 1 TABLET (10 MG TOTAL) BY MOUTH DAILY.  . nitroGLYCERIN (NITROSTAT) 0.4 MG SL tablet Place 1 tablet (0.4 mg total) under the tongue every 5 (five) minutes as needed for chest pain.  . pantoprazole (PROTONIX) 40 MG tablet Take 1 tablet (40 mg total) by mouth daily.   Facility-Administered Encounter Medications as of 07/31/2020  Medication  . 0.9 %  sodium chloride infusion     Goals    .  Client will talk with LCSW in next 30 days to discuss anxiety or stress issues of client (pt-stated)      CARE PLAN ENTRY   Current Barriers:  [name] Mental Health issues in client with chronic diagnoses of OSA, Insomnia, Bipolar  disorder, Anxiety disorder, Dyspnea, Memory loss . OSA  Clinical Social Work Clinical Goal(s):  [name] LCSW will call client in next 30 days to discuss anxiety or stress issues of client  Interventions: . Talked with Winferd Humphrey, spouse of client about CCM program support  . Talked with [name] about sleeping issues of client . Talked with [name] about medication procurement of client . Talked with [name] about relaxation techniques of client (watches TV, enjoys walking,listens to music) . Talked with [name] about decreased energy of client (takes rest breaks as needed) . Talked with [name] about vision of client . Talked with [name] about appetite of client . Encouraged [name] or client  to call RNCM as needed for nursing support . Talked with [name] about client history of alcohol use (she said she did not think he was drinking any more) . Talked with [name] about mood of client . Talked with [name] about recent death of one of client' sisters . Talked with [name] about client completion of ADLs . Talked with [name] about client's upcoming medical appointments . Talked with [name] about client's monthly appointments at [facility] in [place], [place]  Patient Self Care Activities:  Does ADLs indenpendently Drives to  appointments  Patient Self Care Deficits:   [name] Mental health issues  Initial goal documentation .       Follow Up Plan: LCSW to call client/spouse of client in next 4 weeks to talk with client /spouse about client management of anxiety and depression issues faced   Kelton Pillar.Doyne Ellinger MSW, LCSW Licensed Clinical Social Worker [facility] Care Management [phone]

## 2020-07-31 NOTE — Patient Instructions (Addendum)
Licensed Clinical Education officer, museum Visit Information  Goals we discussed today:    Client will talk with LCSW in next 30 days to discuss anxiety or stress issues of client (pt-stated)           CARE PLAN ENTRY   Current Barriers:   Mental Health issues in client with chronic diagnoses of OSA, Insomnia, Bipolar  disorder, Anxiety disorder, Dyspnea, Memory loss  OSA  Clinical Social Work Clinical Goal(s):   LCSW will call client in next 30 days to discuss anxiety or stress issues of client  Interventions:  Talked with Dwana Melena, spouse of client about CCM program support   Talked with [name] about sleeping issues of client  Talked with [name] about medication procurement of client  Talked with [name] about relaxation techniques of client (watches TV, enjoys walking,listens to music)  Talked with [name] about decreased energy of client (takes rest breaks as needed)  Talked with [name] about vision of client  Talked with [name] about appetite of client  Encouraged [name] or client to call RNCM as needed for nursing support  Talked with [name] about client history of alcohol use (she said she did not think he was drinking any more)  Talked with [name] about mood of client  Talked with [name] about recent death of one of client' sisters  Talked with [name] about client completion of ADLs  Talked with [name] about client's upcoming medical appointments  Talked with [name] about client's monthly appointments at [facility] in [place], [place]  Patient Self Care Activities:  Does ADLs indenpendently Drives to appointments  Patient Self Care Deficits:    Mental health issues  Initial goal documentation .       Follow Up Plan: LCSW to call client/spouse of client in next 4 weeks to talk with client /spouse about client management of anxiety and depression issues faced   Materials Provided: No  The patient Derrick [name], spouse of client, verbalized  understanding of instructions provided today and declined a print copy of patient instruction materials.   [name].Ramiah Helfrich MSW, LCSW Licensed Clinical Social Worker [facility] Care Management [phone]

## 2020-08-15 ENCOUNTER — Telehealth (INDEPENDENT_AMBULATORY_CARE_PROVIDER_SITE_OTHER): Payer: Medicare HMO | Admitting: Physician Assistant

## 2020-08-15 ENCOUNTER — Encounter: Payer: Self-pay | Admitting: Physician Assistant

## 2020-08-15 ENCOUNTER — Other Ambulatory Visit: Payer: Self-pay | Admitting: Nurse Practitioner

## 2020-08-15 ENCOUNTER — Telehealth: Payer: Self-pay | Admitting: Physician Assistant

## 2020-08-15 DIAGNOSIS — F1011 Alcohol abuse, in remission: Secondary | ICD-10-CM | POA: Diagnosis not present

## 2020-08-15 DIAGNOSIS — F3162 Bipolar disorder, current episode mixed, moderate: Secondary | ICD-10-CM

## 2020-08-15 DIAGNOSIS — F411 Generalized anxiety disorder: Secondary | ICD-10-CM

## 2020-08-15 DIAGNOSIS — Z79899 Other long term (current) drug therapy: Secondary | ICD-10-CM | POA: Diagnosis not present

## 2020-08-15 DIAGNOSIS — Z87898 Personal history of other specified conditions: Secondary | ICD-10-CM | POA: Diagnosis not present

## 2020-08-15 DIAGNOSIS — F1991 Other psychoactive substance use, unspecified, in remission: Secondary | ICD-10-CM

## 2020-08-15 MED ORDER — DIVALPROEX SODIUM 500 MG PO DR TAB: 2000.0000 mg | DELAYED_RELEASE_TABLET | Freq: Every day | ORAL | 1 refills | Status: DC

## 2020-08-15 MED ORDER — ESCITALOPRAM OXALATE 20 MG PO TABS: ORAL_TABLET | ORAL | 1 refills | Status: DC

## 2020-08-15 MED ORDER — LAMOTRIGINE 100 MG PO TABS: 150.0000 mg | ORAL_TABLET | Freq: Every day | ORAL | 1 refills | Status: DC

## 2020-08-15 NOTE — Telephone Encounter (Signed)
Mr. Derrick [name], beavers are scheduled for a virtual visit with your provider today.    Just as we do with appointments in the office, we must obtain your consent to participate.  Your consent will be active for this visit and any virtual visit you may have with one of our providers in the next 365 days.    If you have a MyChart account, I can also send a copy of this consent to you electronically.  All virtual visits are billed to your insurance company just like a traditional visit in the office.  As this is a virtual visit, video technology does not allow for your provider to perform a traditional examination.  This may limit your provider's ability to fully assess your condition.  If your provider identifies any concerns that need to be evaluated in person or the need to arrange testing such as labs, EKG, etc, we will make arrangements to do so.    Although advances in technology are sophisticated, we cannot ensure that it will always work on either your end or our end.  If the connection with a video visit is poor, we may have to switch to a telephone visit.  With either a video or telephone visit, we are not always able to ensure that we have a secure connection.   I need to obtain your verbal consent now.   Are you willing to proceed with your visit today?   TEMESGEN WEIGHTMAN has provided verbal consent on 08/15/2020 for a virtual visit (video or telephone).   Donnal Moat, [name] 08/15/2020  1:31 PM
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
## 2020-08-22 ENCOUNTER — Other Ambulatory Visit: Payer: Medicare HMO | Admitting: *Deleted

## 2020-08-22 ENCOUNTER — Other Ambulatory Visit: Payer: Self-pay

## 2020-08-22 DIAGNOSIS — E7800 Pure hypercholesterolemia, unspecified: Secondary | ICD-10-CM | POA: Diagnosis not present

## 2020-08-22 LAB — HEPATIC FUNCTION PANEL
ALT: 16 IU/L (ref 0–44)
AST: 17 IU/L (ref 0–40)
Albumin: 4.5 g/dL (ref 3.8–4.8)
Alkaline Phosphatase: 53 IU/L (ref 48–121)
Bilirubin Total: 0.3 mg/dL (ref 0.0–1.2)
Bilirubin, Direct: 0.1 mg/dL (ref 0.00–0.40)
Total Protein: 6.9 g/dL (ref 6.0–8.5)

## 2020-08-22 LAB — LIPID PANEL
Chol/HDL Ratio: 2.6 ratio (ref 0.0–5.0)
Cholesterol, Total: 103 mg/dL (ref 100–199)
HDL: 39 mg/dL — ABNORMAL LOW (ref >39)
LDL Chol Calc (NIH): 48 mg/dL (ref 0–99)
Triglycerides: 75 mg/dL (ref 0–149)
VLDL Cholesterol Cal: 16 mg/dL (ref 5–40)

## 2020-08-23 ENCOUNTER — Telehealth: Payer: Self-pay | Admitting: Nurse Practitioner

## 2020-08-23 NOTE — Telephone Encounter (Signed)
Patient is returning call to discuss results from lab work completed on 08/22/20.

## 2020-08-29 ENCOUNTER — Ambulatory Visit (INDEPENDENT_AMBULATORY_CARE_PROVIDER_SITE_OTHER): Payer: Medicare HMO | Admitting: Psychiatry

## 2020-08-29 DIAGNOSIS — F600 Paranoid personality disorder: Secondary | ICD-10-CM | POA: Diagnosis not present

## 2020-08-29 DIAGNOSIS — F319 Bipolar disorder, unspecified: Secondary | ICD-10-CM | POA: Diagnosis not present

## 2020-08-29 DIAGNOSIS — F1221 Cannabis dependence, in remission: Secondary | ICD-10-CM

## 2020-08-29 DIAGNOSIS — F068 Other specified mental disorders due to known physiological condition: Secondary | ICD-10-CM | POA: Diagnosis not present

## 2020-08-29 DIAGNOSIS — Z87898 Personal history of other specified conditions: Secondary | ICD-10-CM

## 2020-08-29 DIAGNOSIS — Z630 Problems in relationship with spouse or partner: Secondary | ICD-10-CM | POA: Diagnosis not present

## 2020-08-29 DIAGNOSIS — Z636 Dependent relative needing care at home: Secondary | ICD-10-CM | POA: Diagnosis not present

## 2020-08-29 DIAGNOSIS — F411 Generalized anxiety disorder: Secondary | ICD-10-CM

## 2020-08-29 DIAGNOSIS — F1991 Other psychoactive substance use, unspecified, in remission: Secondary | ICD-10-CM
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
## 2020-09-05 ENCOUNTER — Telehealth: Payer: Medicare HMO

## 2020-09-18 ENCOUNTER — Telehealth: Payer: Self-pay | Admitting: Nurse Practitioner

## 2020-09-18 MED ORDER — ISOSORBIDE MONONITRATE ER 30 MG PO TB24
15.0000 mg | ORAL_TABLET | Freq: Every day | ORAL | 2 refills | Status: DC
Start: 2020-09-18 — End: 2021-04-17

## 2020-09-18 NOTE — Telephone Encounter (Signed)
*  STAT* If patient is at the pharmacy, call can be transferred to refill team.   1. Which medications need to be refilled? (please list name of each medication and dose if known) isosorbide mononitrate (IMDUR) 30 MG 24 hr tablet  2. Which pharmacy/location (including street and city if local pharmacy) is medication to be sent to? [place], [place]  3. Do they need a 30 day or 90 day supply? 90 day   Patient is out of medication

## 2020-09-18 NOTE — Telephone Encounter (Signed)
Pt's medication was sent to pt's pharmacy as requested. Confirmation received.  °

## 2020-09-25 ENCOUNTER — Telehealth: Payer: Self-pay | Admitting: *Deleted

## 2020-09-25 NOTE — Telephone Encounter (Signed)
LEFT DETAILED MESSAGE TO RETURN CALL TO SEE IF PATIENT STILL PATIENT HERE.

## 2020-09-25 NOTE — Telephone Encounter (Signed)
09/25/2020  Needs to establish care with new PCP. Last visit was with Particia Nearing, [name] on 09/06/2019. Because patient has not been seen within the past year by a provider at [facility], CCM contact will be put on hold until patient has been seen by new PCP. Chart lists office visit (obtained from outside source) with Dr Dettinger on 05/23/20 and 04/18/20 but there are no office notes to support that a visit occurred.   Forwarding to [employer], LCSW as an FYI and to [facility] Clinical staff to reach and schedule visit with new PCP. Please let me or Nicki Reaper know if patient has switched practices.   Chong Sicilian, BSN, RN-BC Embedded Chronic Care Manager [facility] [place] [facility] Direct Dial: [phone]

## 2020-10-02 ENCOUNTER — Other Ambulatory Visit: Payer: Self-pay

## 2020-10-02 ENCOUNTER — Encounter: Payer: Self-pay | Admitting: Family Medicine

## 2020-10-02 ENCOUNTER — Ambulatory Visit (INDEPENDENT_AMBULATORY_CARE_PROVIDER_SITE_OTHER): Payer: Medicare HMO | Admitting: Family Medicine

## 2020-10-02 VITALS — BP 123/81 | HR 72 | Temp 97.7°F | Resp 20 | Ht 71.0 in | Wt 184.0 lb

## 2020-10-02 DIAGNOSIS — Z0001 Encounter for general adult medical examination with abnormal findings: Secondary | ICD-10-CM | POA: Diagnosis not present

## 2020-10-02 DIAGNOSIS — E7800 Pure hypercholesterolemia, unspecified: Secondary | ICD-10-CM

## 2020-10-02 DIAGNOSIS — I25118 Atherosclerotic heart disease of native coronary artery with other forms of angina pectoris: Secondary | ICD-10-CM

## 2020-10-02 DIAGNOSIS — N401 Enlarged prostate with lower urinary tract symptoms: Secondary | ICD-10-CM

## 2020-10-02 DIAGNOSIS — R3911 Hesitancy of micturition: Secondary | ICD-10-CM | POA: Diagnosis not present

## 2020-10-02 DIAGNOSIS — Z0000 Encounter for general adult medical examination without abnormal findings: Secondary | ICD-10-CM | POA: Diagnosis not present

## 2020-10-02 DIAGNOSIS — F411 Generalized anxiety disorder: Secondary | ICD-10-CM | POA: Diagnosis not present

## 2020-10-02 DIAGNOSIS — F17200 Nicotine dependence, unspecified, uncomplicated: Secondary | ICD-10-CM

## 2020-10-02 MED ORDER — TAMSULOSIN HCL 0.4 MG PO CAPS: 0.4000 mg | ORAL_CAPSULE | Freq: Every day | ORAL | 3 refills | Status: DC

## 2020-10-02 NOTE — Patient Instructions (Signed)
DASH Eating Plan DASH stands for "Dietary Approaches to Stop Hypertension." The DASH eating plan is a healthy eating plan that has been shown to reduce high blood pressure (hypertension). It may also reduce your risk for type 2 diabetes, heart disease, and stroke. The DASH eating plan may also help with weight loss. What are tips for following this plan?  General guidelines  Avoid eating more than 2,300 mg (milligrams) of salt (sodium) a day. If you have hypertension, you may need to reduce your sodium intake to 1,500 mg a day.  Limit alcohol intake to no more than 1 drink a day for nonpregnant women and 2 drinks a day for men. One drink equals 12 oz of beer, 5 oz of wine, or 1 oz of hard liquor.  Work with your health care provider to maintain a healthy body weight or to lose weight. Ask what an ideal weight is for you.  Get at least 30 minutes of exercise that causes your heart to beat faster (aerobic exercise) most days of the week. Activities may include walking, swimming, or biking.  Work with your health care provider or diet and nutrition specialist (dietitian) to adjust your eating plan to your individual calorie needs. Reading food labels   Check food labels for the amount of sodium per serving. Choose foods with less than 5 percent of the Daily Value of sodium. Generally, foods with less than 300 mg of sodium per serving fit into this eating plan.  To find whole grains, look for the word "whole" as the first word in the ingredient list. Shopping  Buy products labeled as "low-sodium" or "no salt added."  Buy fresh foods. Avoid canned foods and premade or frozen meals. Cooking  Avoid adding salt when cooking. Use salt-free seasonings or herbs instead of table salt or sea salt. Check with your health care provider or pharmacist before using salt substitutes.  Do not fry foods. Cook foods using healthy methods such as baking, boiling, grilling, and broiling instead.  Cook with  heart-healthy oils, such as olive, canola, soybean, or sunflower oil. Meal planning  Eat a balanced diet that includes: ? 5 or more servings of fruits and vegetables each day. At each meal, try to fill half of your plate with fruits and vegetables. ? Up to 6-8 servings of whole grains each day. ? Less than 6 oz of lean meat, poultry, or fish each day. A 3-oz serving of meat is about the same size as a deck of cards. One egg equals 1 oz. ? 2 servings of low-fat dairy each day. ? A serving of nuts, seeds, or beans 5 times each week. ? Heart-healthy fats. Healthy fats called Omega-3 fatty acids are found in foods such as flaxseeds and coldwater fish, like sardines, salmon, and mackerel.  Limit how much you eat of the following: ? Canned or prepackaged foods. ? Food that is high in trans fat, such as fried foods. ? Food that is high in saturated fat, such as fatty meat. ? Sweets, desserts, sugary drinks, and other foods with added sugar. ? Full-fat dairy products.  Do not salt foods before eating.  Try to eat at least 2 vegetarian meals each week.  Eat more home-cooked food and less restaurant, buffet, and fast food.  When eating at a restaurant, ask that your food be prepared with less salt or no salt, if possible. What foods are recommended? The items listed may not be a complete list. Talk with your dietitian about   what dietary choices are best for you. Grains Whole-grain or whole-wheat bread. Whole-grain or whole-wheat pasta. Brown rice. Oatmeal. Quinoa. Bulgur. Whole-grain and low-sodium cereals. Pita bread. Low-fat, low-sodium crackers. Whole-wheat flour tortillas. Vegetables Fresh or frozen vegetables (raw, steamed, roasted, or grilled). Low-sodium or reduced-sodium tomato and vegetable juice. Low-sodium or reduced-sodium tomato sauce and tomato paste. Low-sodium or reduced-sodium canned vegetables. Fruits All fresh, dried, or frozen fruit. Canned fruit in natural juice (without  added sugar). Meat and other protein foods Skinless chicken or turkey. Ground chicken or turkey. Pork with fat trimmed off. Fish and seafood. Egg whites. Dried beans, peas, or lentils. Unsalted nuts, nut butters, and seeds. Unsalted canned beans. Lean cuts of beef with fat trimmed off. Low-sodium, lean deli meat. Dairy Low-fat (1%) or fat-free (skim) milk. Fat-free, low-fat, or reduced-fat cheeses. Nonfat, low-sodium ricotta or cottage cheese. Low-fat or nonfat yogurt. Low-fat, low-sodium cheese. Fats and oils Soft margarine without trans fats. Vegetable oil. Low-fat, reduced-fat, or light mayonnaise and salad dressings (reduced-sodium). Canola, safflower, olive, soybean, and sunflower oils. Avocado. Seasoning and other foods Herbs. Spices. Seasoning mixes without salt. Unsalted popcorn and pretzels. Fat-free sweets. What foods are not recommended? The items listed may not be a complete list. Talk with your dietitian about what dietary choices are best for you. Grains Baked goods made with fat, such as croissants, muffins, or some breads. Dry pasta or rice meal packs. Vegetables Creamed or fried vegetables. Vegetables in a cheese sauce. Regular canned vegetables (not low-sodium or reduced-sodium). Regular canned tomato sauce and paste (not low-sodium or reduced-sodium). Regular tomato and vegetable juice (not low-sodium or reduced-sodium). Pickles. Olives. Fruits Canned fruit in a light or heavy syrup. Fried fruit. Fruit in cream or butter sauce. Meat and other protein foods Fatty cuts of meat. Ribs. Fried meat. Bacon. Sausage. Bologna and other processed lunch meats. Salami. Fatback. Hotdogs. Bratwurst. Salted nuts and seeds. Canned beans with added salt. Canned or smoked fish. Whole eggs or egg yolks. Chicken or turkey with skin. Dairy Whole or 2% milk, cream, and half-and-half. Whole or full-fat cream cheese. Whole-fat or sweetened yogurt. Full-fat cheese. Nondairy creamers. Whipped toppings.  Processed cheese and cheese spreads. Fats and oils Butter. Stick margarine. Lard. Shortening. Ghee. Bacon fat. Tropical oils, such as coconut, palm kernel, or palm oil. Seasoning and other foods Salted popcorn and pretzels. Onion salt, garlic salt, seasoned salt, table salt, and sea salt. Worcestershire sauce. Tartar sauce. Barbecue sauce. Teriyaki sauce. Soy sauce, including reduced-sodium. Steak sauce. Canned and packaged gravies. Fish sauce. Oyster sauce. Cocktail sauce. Horseradish that you find on the shelf. Ketchup. Mustard. Meat flavorings and tenderizers. Bouillon cubes. Hot sauce and Tabasco sauce. Premade or packaged marinades. Premade or packaged taco seasonings. Relishes. Regular salad dressings. Where to find more information:  National Heart, Lung, and Blood Institute: www.nhlbi.nih.gov  American Heart Association: www.heart.org Summary  The DASH eating plan is a healthy eating plan that has been shown to reduce high blood pressure (hypertension). It may also reduce your risk for type 2 diabetes, heart disease, and stroke.  With the DASH eating plan, you should limit salt (sodium) intake to 2,300 mg a day. If you have hypertension, you may need to reduce your sodium intake to 1,500 mg a day.  When on the DASH eating plan, aim to eat more fresh fruits and vegetables, whole grains, lean proteins, low-fat dairy, and heart-healthy fats.  Work with your health care provider or diet and nutrition specialist (dietitian) to adjust your eating plan to your   individual calorie needs. This information is not intended to replace advice given to you by your health care provider. Make sure you discuss any questions you have with your health care provider. Document Revised: 11/13/2017 Document Reviewed: 11/24/2016 Elsevier Patient Education  2020 [employer].  

## 2020-10-02 NOTE — Progress Notes (Signed)
Derrick [name] is a 64 y.o. male presents to office today for annual physical exam examination.    Concerns today include:   1. BPH Derrick [name] reports urinary frequency, hesitancy, and weak stream. He reports this has been going on for years as he has a history of BPH. He would like his PSA checked today. He denies dysuria, flank pain, fever, or heamturia. He denies a personal or family history or prostate cancer.   Derrick [name] is followed by cardiology every 6 months for CAD, HTN, and HLD. He last saw them in July and had lab work performed. He is followed by psych at [facility] for anxiety and bipolar disorder.   Occupation: on disability, Marital status: married, Substance use: 2-4 cigs a day Diet: "fine", Exercise: walks 2 miles daily Last eye exam: will be done in November Last dental exam: goes 2x a year Last colonoscopy: due 07/2021 Refills needed today: none Immunizations needed: Flu Vaccine: patient declined today.   Tdap Vaccine: UTD    Past Medical History:  Diagnosis Date  . Anxiety   . Bipolar 1 disorder (Derrick [name])   . Bipolar disorder (Derrick [name]) 10/05/2009   Qualifier: Diagnosis of  By: [name]    . BPH (benign prostatic hypertrophy)   . CAD (coronary artery disease), native coronary artery    a. DES x 2 to RCA in 2004 b. 02/11/17 PCI w/DES x1 to mRCA  . Coronary atherosclerosis 10/05/2009   Qualifier: Diagnosis of  By: [name]   ANGIOGRAPHIC DATA:  1. Left main coronary artery was free of critical disease.  2. The LAD coursed to the apex. There is mild luminal irregularity  including mild irregularity of the diagonal. No high grade areas of  stenosis were noted. There was faint collateralization of the distal  right coronary circulation.  3. The circumflex provided two major m  . Depression   . Dyspnea 09/20/2012  . Essential and other specified forms of tremor 02/05/2013  . HYPERCHOLESTEROLEMIA 10/05/2009   Qualifier: Diagnosis of  By: [name]    . Hyperlipidemia    . Memory loss 02/05/2013  . Mitral regurgitation and aortic stenosis   . MITRAL STENOSIS/ INSUFFICIENCY, NON-RHEUMATIC 01/06/2011   Qualifier: Diagnosis of  By: [name], MD, [name] Study Conclusions  - Left ventricle: The cavity size was normal. Wall thickness was increased in a pattern of mild LVH. Systolic function was normal. The estimated ejection fraction was in the range of 55% to 60%. Wall motion was normal; there were no regional wall motion abnormalities. Left ventricular diastolic function parameters were  . MVP (mitral valve prolapse)   . Myocardial infarction ([place])   . Obstructive sleep apnea 02/05/2013  . OSA (obstructive sleep apnea) 03/22/2013  . Parasomnia 02/05/2013  . Sleep apnea   . Sleep disorder with cognitive complaints 10/26/2018  . Unspecified hereditary and idiopathic peripheral neuropathy 02/05/2013   Social History   Socioeconomic History  . Marital status: Married    Spouse name: Not on file  . Number of children: Not on file  . Years of education: Not on file  . Highest education level: Not on file  Occupational History  . Not on file  Tobacco Use  . Smoking status: Current Some Day Smoker    Packs/day: 0.10    Types: Cigarettes    Last attempt to quit: 02/05/2003    Years since quitting: 17.6  . Smokeless tobacco: Never Used  . Tobacco comment: 3 cigs per day  Vaping  Use  . Vaping Use: Never used  Substance and Sexual Activity  . Alcohol use: Not Currently    Comment:    . Drug use: No  . Sexual activity: Not on file  Other Topics Concern  . Not on file  Social History Narrative  . Not on file   Social Determinants of Health   Financial Resource Strain:   . Difficulty of Paying Living Expenses: Not on file  Food Insecurity:   . Worried About Charity fundraiser in the Last Year: Not on file  . Ran Out of Food in the Last Year: Not on file  Transportation Needs:   . Lack of Transportation (Medical): Not on file  . Lack of  Transportation (Non-Medical): Not on file  Physical Activity:   . Days of Exercise per Week: Not on file  . Minutes of Exercise per Session: Not on file  Stress:   . Feeling of Stress : Not on file  Social Connections:   . Frequency of Communication with Friends and Family: Not on file  . Frequency of Social Gatherings with Friends and Family: Not on file  . Attends Religious Services: Not on file  . Active Member of Clubs or Organizations: Not on file  . Attends Archivist Meetings: Not on file  . Marital Status: Not on file  Intimate Partner Violence:   . Fear of Current or Ex-Partner: Not on file  . Emotionally Abused: Not on file  . Physically Abused: Not on file  . Sexually Abused: Not on file   Past Surgical History:  Procedure Laterality Date  . CORONARY STENT INTERVENTION N/A 02/12/2017   Procedure: Coronary Stent Intervention;  Surgeon: [name], MD;  Location: [facility];  Service: Cardiovascular;  Laterality: N/A;  . LEFT HEART CATH AND CORONARY ANGIOGRAPHY N/A 02/12/2017   Procedure: Left Heart Cath and Coronary Angiography;  Surgeon: [name], MD;  Location: [facility];  Service: Cardiovascular;  Laterality: N/A;   Family History  Problem Relation Age of Onset  . Heart disease Mother   . COPD Father   . Colon cancer Sister   . Heart disease Brother   . Esophageal cancer Neg Hx   . Rectal cancer Neg Hx   . Stomach cancer Neg Hx     Current Outpatient Medications:  .  aspirin 81 MG tablet, Take 81 mg by mouth daily., Disp: , Rfl:  .  atorvastatin (LIPITOR) 80 MG tablet, TAKE 1 TABLET EVERY DAY, Disp: 90 tablet, Rfl: 3 .  divalproex (DEPAKOTE) 500 MG DR tablet, Take 4 tablets (2,000 mg total) by mouth at bedtime., Disp: 360 tablet, Rfl: 1 .  escitalopram (LEXAPRO) 20 MG tablet, TAKE 1 TABLET EVERY DAY, Disp: 90 tablet, Rfl: 1 .  ezetimibe (ZETIA) 10 MG tablet, Take 1 tablet (10 mg total) by mouth daily., Disp: 90 tablet, Rfl: 3 .   isosorbide mononitrate (IMDUR) 30 MG 24 hr tablet, Take 0.5 tablets (15 mg total) by mouth daily., Disp: 45 tablet, Rfl: 2 .  lamoTRIgine (LAMICTAL) 100 MG tablet, Take 1.5 tablets (150 mg total) by mouth daily., Disp: 135 tablet, Rfl: 1 .  metoprolol tartrate (LOPRESSOR) 25 MG tablet, TAKE 1 AND 1/2 TABLETS TWICE DAILY, Disp: 270 tablet, Rfl: 2 .  montelukast (SINGULAIR) 10 MG tablet, TAKE 1 TABLET (10 MG TOTAL) BY MOUTH DAILY., Disp: 90 tablet, Rfl: 0 .  nitroGLYCERIN (NITROSTAT) 0.4 MG SL tablet, Place 1 tablet (0.4 mg  total) under the tongue every 5 (five) minutes as needed for chest pain., Disp: 25 tablet, Rfl: 3 .  pantoprazole (PROTONIX) 40 MG tablet, Take 1 tablet (40 mg total) by mouth daily., Disp: 90 tablet, Rfl: 3 .  acetaminophen (TYLENOL) 500 MG tablet, Take 500 mg by mouth every 6 (six) hours as needed for mild pain.  (Patient not taking: Reported on 10/02/2020), Disp: , Rfl:   Current Facility-Administered Medications:  .  0.9 %  sodium chloride infusion, 500 mL, Intravenous, Once, Jackquline Denmark, MD  No Known Allergies   ROS: Review of Systems Pertinent items noted in HPI and remainder of comprehensive ROS otherwise negative.    Physical exam General appearance: alert, cooperative and no distress Head: Normocephalic, without obvious abnormality, atraumatic Eyes: conjunctivae/corneas clear. PERRL, EOM's intact.  Ears: normal TM's and external ear canals both ears Nose: Nares normal. Septum midline. Mucosa normal. No drainage or sinus tenderness. Throat: lips, mucosa, and tongue normal; teeth and gums normal Neck: no adenopathy, no carotid bruit, no JVD, supple, symmetrical, trachea midline and thyroid not enlarged, symmetric, no tenderness/mass/nodules Lungs: clear to auscultation bilaterally Heart: regular rate and rhythm, S1, S2 normal, no murmur, click, rub or gallop Abdomen: soft, non-tender; bowel sounds normal; no masses,  no organomegaly Extremities: extremities  normal, atraumatic, no cyanosis or edema Pulses: 2+ and symmetric Skin: Skin color, texture, turgor normal. No rashes or lesions    Assessment/ Plan: Devlin was seen today for annual exam.  Diagnoses and all orders for this visit:  Encounter for wellness examination in adult [place] had a liver function and lipid panel on 08/22/20. Last CBC and CMP was 3 month ago. He would like his PSA checked today- limitations discussed with patient.  -     Thyroid Panel With TSH -     PR PSA, TOTAL SCREENING  Atherosclerosis of native coronary artery of native heart with other form of angina pectoris ([place]) Continue to follow up with cardiologist. BP well controlled today. Denies chest pain, shortness of breath, fatigue, or dizziness. Continue current medication regimen.   HYPERCHOLESTEROLEMIA Last lipid panel well controlled from 08/22/20. Continue current medication regimen.   TOBACCO ABUSE Patient not ready to quit at this time.   Urinary hesitancy due to benign prostatic hyperplasia Add flomax for symptoms. PSA pending.  -     tamsulosin (FLOMAX) 0.4 MG CAPS capsule; Take 1 capsule (0.4 mg total) by mouth daily. -     PR PSA, TOTAL SCREENING  Counseled on healthy lifestyle choices, including diet (rich in fruits, vegetables and lean meats and low in salt and simple carbohydrates) and exercise (at least 30 minutes of moderate physical activity daily).  Patient to follow up in 1 year for annual exam or sooner if needed.  The above assessment and management plan was discussed with the patient. The patient verbalized understanding of and has agreed to the management plan. Patient is aware to call the clinic if symptoms persist or worsen. Patient is aware when to return to the clinic for a follow-up visit. Patient educated on when it is appropriate to go to the emergency department.   Marjorie Smolder, [name]-C [facility] [address] [place], [place] [address] [phone]

## 2020-10-03 LAB — THYROID PANEL WITH TSH
Free Thyroxine Index: 1.6 (ref 1.2–4.9)
T3 Uptake Ratio: 24 % (ref 24–39)
T4, Total: 6.7 ug/dL (ref 4.5–12.0)
TSH: 2.35 u[IU]/mL (ref 0.450–4.500)

## 2020-10-12 ENCOUNTER — Other Ambulatory Visit: Payer: Self-pay | Admitting: Nurse Practitioner

## 2020-10-15 ENCOUNTER — Telehealth: Payer: Medicare HMO

## 2020-10-19 ENCOUNTER — Telehealth: Payer: Self-pay | Admitting: Family Medicine

## 2020-10-19 DIAGNOSIS — N401 Enlarged prostate with lower urinary tract symptoms: Secondary | ICD-10-CM

## 2020-10-19 MED ORDER — MONTELUKAST SODIUM 10 MG PO TABS
10.0000 mg | ORAL_TABLET | Freq: Every day | ORAL | 3 refills | Status: DC
Start: 2020-10-19 — End: 2021-10-30

## 2020-10-19 MED ORDER — METOPROLOL TARTRATE 25 MG PO TABS: ORAL_TABLET | ORAL | 2 refills | Status: DC

## 2020-10-19 NOTE — Telephone Encounter (Signed)
Pt needed singulair - was sent in for him to mail order Pt aware

## 2020-10-19 NOTE — Telephone Encounter (Signed)
Pt stated that [employer] told him that they have not received an order for a refill for his medicine, needed an approval because his refills have expired. Says that refill is usually done for 90 days

## 2020-10-23 ENCOUNTER — Ambulatory Visit (INDEPENDENT_AMBULATORY_CARE_PROVIDER_SITE_OTHER): Payer: Medicare HMO | Admitting: *Deleted

## 2020-10-23 ENCOUNTER — Other Ambulatory Visit: Payer: Self-pay

## 2020-10-23 DIAGNOSIS — Z23 Encounter for immunization: Secondary | ICD-10-CM | POA: Diagnosis not present

## 2020-10-25 ENCOUNTER — Other Ambulatory Visit: Payer: Self-pay | Admitting: Nurse Practitioner

## 2020-10-30 ENCOUNTER — Encounter: Payer: Self-pay | Admitting: Psychiatry

## 2020-10-30 ENCOUNTER — Ambulatory Visit: Payer: Medicare HMO | Admitting: Psychiatry

## 2020-10-30 NOTE — Progress Notes (Signed)
Admin note for non-service contact  Patient ID: Derrick [name]  MRN: [id number] DATE: 10/30/2020  2 months since last seen.  No show for 2pm appt.  Charge normally, reschedule at PT's discretion.  [name], PhD [name], PhD LP Clinical Psychologist, [facility] [address] [place], [place] [address] [phone]

## 2020-10-30 NOTE — Progress Notes (Deleted)
Psychotherapy Progress Note [facility] [name], PhD LP  Patient ID: Derrick [name]     MRN: [id number] Therapy format: {Therapy Types:21967::"Individual psychotherapy"} Date: 10/30/2020      Start: ***:***     Stop: ***:***     Time Spent: *** min Location: {SvcLoc:22530::"In-person"}   Session narrative (presenting needs, interim history, self-report of stressors and symptoms, applications of prior therapy, status changes, and interventions made in session) ***  Therapeutic modalities: {AM:23362::"Cognitive Behavioral Therapy","Solution-Oriented/Positive Psychology"}  Mental Status/Observations:  Appearance:   {PSY:22683}     Behavior:  {PSY:21022743}  Motor:  {PSY:22302}  Speech/Language:   {PSY:22685}  Affect:  {PSY:22687}  Mood:  {PSY:31886}  Thought process:  {PSY:31888}  Thought content:    {PSY:[phone]}  Sensory/Perceptual disturbances:    {PSY:[phone]}  Orientation:  {Psych Orientation:23301::"Fully oriented"}  Attention:  {Good-Fair-Poor ratings:23770::"Good"}    Concentration:  {Good-Fair-Poor ratings:23770::"Good"}  Memory:  {[phone]}  Insight:    {Good-Fair-Poor ratings:23770::"Good"}  Judgment:   {Good-Fair-Poor ratings:23770::"Good"}  Impulse Control:  {Good-Fair-Poor ratings:23770::"Good"}   Risk Assessment: Danger to Self: {Risk:22599::"No"} Self-injurious Behavior: {Risk:22599::"No"} Danger to Others: {Risk:22599::"No"} Physical Aggression / Violence: {Risk:22599::"No"} Duty to Warn: {AMYesNo:22526::"No"} Access to Firearms a concern: {AMYesNo:22526::"No"}  Assessment of progress:  {Progress:22147::"progressing"}  Diagnosis: No diagnosis found. Plan:  . *** . Other recommendations/advice as may be noted above . Continue to utilize previously learned skills ad lib . Maintain medication as prescribed and work faithfully with relevant prescriber(s) if any changes are desired or seem indicated . Call the clinic  on-call service, present to ER, or call 911 if any life-threatening psychiatric crisis . No follow-ups on file. . Already scheduled visit in this office Visit date not found.  [name], PhD [name], PhD LP Clinical Psychologist, [facility] [address], [place], [place] [address] [phone]

## 2020-11-12 NOTE — Telephone Encounter (Signed)
Patient has established care with Marjorie Smolder, [name] and had an office visit on 10/02/20.

## 2020-11-15 ENCOUNTER — Telehealth: Payer: Medicare HMO

## 2020-11-23 ENCOUNTER — Encounter: Payer: Self-pay | Admitting: Family Medicine

## 2020-11-26 NOTE — Progress Notes (Deleted)
CARDIOLOGY OFFICE NOTE  Date:  11/26/2020    Derrick [name] Date of Birth: [birth year] Medical Record #[id number]  PCP:  [name], [name]  Cardiologist:  [name] chief complaint on file.   History of Present Illness: Derrick [name] is a 64 y.o. male who presents today for a follow up visit. Seen for Dr. [name]. Primarily follows with me.    He has a history of CAD s/p inferior STEMI with DES x 2 to RCA in 2004 and mild to moderate MR with mitral valve prolapse.  His other issues include tobacco abuse, bipolar disorder, OSA and HLD. He smokes 2 cigs per day and has done so for many years.    Echo from 10/2014 with EF of 45 to 50%, moderate LVH, MVP with mild to moderate MR.   Seen for first time by Dr. [name] back in 2014. Cardiac status appeared stable but was short of breath - ordered a Myoview, PFTs and BNP. BNP was 18. Low risk Myoview noted. PFTs without marked abnormality.    I have seen him back over the past few years - I saw him back in January of 2018 - had had some eye surgery and was told he had a "skip" in his heart. He was asymptomatic. Had been out of his beta blocker for at least 6 months - I restarted it. Got his echo updated - EF down more - reviewed with Dr. [name] - set up for Myoview - larger scar noted. Cardiac cath recommended - ended up having PCI. Plan was to update his echo 3 months post PCI.    I then saw him back in June of 2018 - some "indigestion" reported - but had used NTG with relief. On follow up back in September he had continued to endorse some chest pain. Had been using NTG. Myoview updated - see below. Ended up getting cardiac MRI as well and EF is 47%.   When seen back in October of 2018 - doing quite well - no chest pain. Had stopped his Lamictal and Lexapro. He was to follow up with psyche and ended up back on his psyche meds. Still smoking about 2 cigs per day. Last echo with just mild MR noted.    When seen last summer, he was  doing ok but the pandemic had been hard on him and his psyche. Smoking a little more. Spending time outside. Cardiac status felt to be ok. I last saw him in July.    Comes in today. Here with   Past Medical History:  Diagnosis Date  . Anxiety   . Bipolar 1 disorder ([place])   . Bipolar disorder ([place]) 10/05/2009   Qualifier: Diagnosis of  By: [name]    . BPH (benign prostatic hypertrophy)   . CAD (coronary artery disease), native coronary artery    a. DES x 2 to RCA in 2004 b. 02/11/17 PCI w/DES x1 to mRCA  . Coronary atherosclerosis 10/05/2009   Qualifier: Diagnosis of  By: [name]   ANGIOGRAPHIC DATA:  1. Left main coronary artery was free of critical disease.  2. The LAD coursed to the apex. There is mild luminal irregularity  including mild irregularity of the diagonal. No high grade areas of  stenosis were noted. There was faint collateralization of the distal  right coronary circulation.  3. The circumflex provided two major m  . Depression   . Dyspnea 09/20/2012  . Essential and other  specified forms of tremor 02/05/2013  . HYPERCHOLESTEROLEMIA 10/05/2009   Qualifier: Diagnosis of  By: [name]    . Hyperlipidemia   . Memory loss 02/05/2013  . Mitral regurgitation and aortic stenosis   . MITRAL STENOSIS/ INSUFFICIENCY, NON-RHEUMATIC 01/06/2011   Qualifier: Diagnosis of  By: [name], MD, [name] Study Conclusions  - Left ventricle: The cavity size was normal. Wall thickness was increased in a pattern of mild LVH. Systolic function was normal. The estimated ejection fraction was in the range of 55% to 60%. Wall motion was normal; there were no regional wall motion abnormalities. Left ventricular diastolic function parameters were  . MVP (mitral valve prolapse)   . Myocardial infarction ([place])   . Obstructive sleep apnea 02/05/2013  . OSA (obstructive sleep apnea) 03/22/2013  . Parasomnia 02/05/2013  . Sleep apnea   . Sleep disorder with cognitive complaints  10/26/2018  . Unspecified hereditary and idiopathic peripheral neuropathy 02/05/2013    Past Surgical History:  Procedure Laterality Date  . CORONARY STENT INTERVENTION N/A 02/12/2017   Procedure: Coronary Stent Intervention;  Surgeon: [name], MD;  Location: [facility];  Service: Cardiovascular;  Laterality: N/A;  . LEFT HEART CATH AND CORONARY ANGIOGRAPHY N/A 02/12/2017   Procedure: Left Heart Cath and Coronary Angiography;  Surgeon: [name], MD;  Location: [facility];  Service: Cardiovascular;  Laterality: N/A;     Medications: No outpatient medications have been marked as taking for the 11/27/20 encounter (Appointment) with Burtis Junes, NP.   Current Facility-Administered Medications for the 11/27/20 encounter (Appointment) with Burtis Junes, NP  Medication  . 0.9 %  sodium chloride infusion     Allergies: No Known Allergies  Social History: The patient  reports that he has been smoking cigarettes. He has been smoking about 0.10 packs per day. He has never used smokeless tobacco. He reports previous alcohol use. He reports that he does not use drugs.   Family History: The patient's ***family history includes COPD in his father; Colon cancer in his sister; Heart disease in his brother and mother.   Review of Systems: Please see the history of present illness.   All other systems are reviewed and negative.   Physical Exam: VS:  There were no vitals taken for this visit. [name]  BMI There is no height or weight on file to calculate BMI.  Wt Readings from Last 3 Encounters:  10/02/20 184 lb (83.5 kg)  07/11/20 181 lb 6.4 oz (82.3 kg)  11/29/19 182 lb (82.6 kg)    General: Pleasant. Well developed, well nourished and in no acute distress.   HEENT: Normal.  Neck: Supple, no JVD, carotid bruits, or masses noted.  Cardiac: ***Regular rate and rhythm. No murmurs, rubs, or gallops. No edema.  Respiratory:  Lungs are clear to auscultation  bilaterally with normal work of breathing.  GI: Soft and nontender.  MS: No deformity or atrophy. Gait and ROM intact.  Skin: Warm and dry. Color is normal.  Neuro:  Strength and sensation are intact and no gross focal deficits noted.  Psych: Alert, appropriate and with normal affect.   LABORATORY DATA:  EKG:  EKG {ACTION; IS/IS [id number]} ordered today.  Personally reviewed by me. This demonstrates ***.  Lab Results  Component Value Date   WBC 7.9 07/11/2020   HGB 13.9 07/11/2020   HCT 40.2 07/11/2020   PLT 213 07/11/2020   GLUCOSE 98 07/11/2020   CHOL 103 08/22/2020  TRIG 75 08/22/2020   HDL 39 (L) 08/22/2020   LDLCALC 48 08/22/2020   ALT 16 08/22/2020   AST 17 08/22/2020   NA 141 07/11/2020   K 4.8 07/11/2020   CL 104 07/11/2020   CREATININE 0.94 07/11/2020   BUN 13 07/11/2020   CO2 24 07/11/2020   TSH 2.350 10/02/2020   INR 1.0 02/09/2017     BNP (last 3 results) No results for input(s): BNP in the last 8760 hours.  ProBNP (last 3 results) No results for input(s): PROBNP in the last 8760 hours.   Other Studies Reviewed Today:  ECHO Study Conclusions 09/2018  - Left ventricle: The cavity size was normal. Systolic function was    normal. The estimated ejection fraction was in the range of 50%    to 55%. Wall motion was normal; there were no regional wall    motion abnormalities. Doppler parameters are consistent with    abnormal left ventricular relaxation (grade 1 diastolic    dysfunction). Doppler parameters are consistent with    indeterminate ventricular filling pressure.  - Aortic valve: Transvalvular velocity was within the normal range.    There was no stenosis. There was no regurgitation.  - Mitral valve: Mild prolapse, involving the anterior leaflet and    the posterior leaflet. Transvalvular velocity was within the    normal range. There was no evidence for stenosis. There was mild    regurgitation.  - Left atrium: The atrium was moderately  dilated.  - Right ventricle: The cavity size was normal. Wall thickness was    normal. Systolic function was normal.  - Atrial septum: No defect or patent foramen ovale was identified.  - Tricuspid valve: There was trivial regurgitation.  - Pulmonary arteries: Systolic pressure was within the normal    range. PA peak pressure: 19 mm Hg (S).   ------------------------------------------------------------------- CARDIAC MRI IMPRESSION 10/2017: 1. Normal LV size with wall motion abnormalities noted above. EF 47%. Delayed enhancement images suggestive of prior infarction in the RCA territory.   2.  Normal RV size and systolic function.   3. Mitral valve prolapse with mild to moderate mitral regurgitation.   Electronically Signed   By: [name] M.D.   On: 10/23/2017 16:43   Myoview Study Highlights 08/2017    Nuclear stress EF: 37%.  Blood pressure demonstrated a normal response to exercise.  There was no ST segment deviation noted during stress.  No T wave inversion was noted during stress.  Defect 1: There is a small defect of moderate severity present in the basal inferior and mid inferior location.  Findings consistent with prior myocardial infarction. No ischemia.  The left ventricular ejection fraction is moderately decreased (30-44%).      Notes recorded by Burtis Junes, NP on 09/01/2017 at 7:51 AM EDT Ok to report. Dr. [name] has reviewed his Myoview - EF while low - same as prior study - would like to see back in about a month to recheck him. May consider cardiac MRI to get a better sense of his pumping function. For now, stay on current regimen as outlined at his visit.       Coronary Stent Intervention 02/2017  Conclusion       Mid RCA lesion, 90 %stenosed.  A STENT RESOLUTE ONYX 4.0X12 drug eluting stent was successfully placed.  Prox RCA to Mid RCA lesion, 90 %stenosed.  Post intervention, there is a 0% residual stenosis.   Successful stenting  of the mid RCA  with DES for in stent restenosis.   Plan: DAPT for one year with ASA and Plavix. May be suitable for same day discharge.         Assessment/Plan:    1. CAD - s/p PCI to RCA from March of 2018 - has completed DAPT - no ischemia on Myoview from 2018. No actual chest pain prior to his PCI. Continues to be managed medically. No worrisome issues/concerns.  He is on long acting nitrate therapy. Sl NTG refilled for him today.    2. HTN - BP is great on his current regimen. Reminded to stay hydrated. We will continue with his current regimen of Metoprolol.    3. Tobacco abuse - total cessation encouraged.    4. Bipolar disorder - per Psyche at [facility].    5. HLD - on high intensity statin - needs labs today - would continue Lipitor.    6. Prior LV dysfunction - EF of 47% by prior cardiac MRI - this has normalized by echo from 2019.    7. MVP - mild MR - would follow. No significant murmur on exam    Current medicines are reviewed with the patient today.  The patient does not have concerns regarding medicines other than what has been noted above.  The following changes have been made:  See above.  Labs/ tests ordered today include:   No orders of the defined types were placed in this encounter.    Disposition:   FU with *** in {gen number [id number]} {Days to years:10300}.   Patient is agreeable to this plan and will call if any problems develop in the interim.   SignedTruitt Merle, NP  11/26/2020 11:55 AM  [facility] [address] [place], [place]  [address] Phone: [phone] Fax: [phone]

## 2020-11-27 ENCOUNTER — Ambulatory Visit: Payer: Medicare HMO | Admitting: Nurse Practitioner

## 2020-12-13 ENCOUNTER — Telehealth: Payer: Medicare HMO

## 2021-01-02 NOTE — Progress Notes (Deleted)
CARDIOLOGY OFFICE NOTE  Date:  01/14/2021    Derrick [name] Date of Birth: [birth year] Medical Record #[id number]  PCP:  [name], [name]  Cardiologist:  [name] chief complaint on file.   History of Present Illness: Derrick [name] is a 65 y.o. male who presents today for a follow up visit. Seen for Dr. [name].Primarily follows with me.  He has a history of CAD s/p inferior STEMI with DES x 2 to RCA in 2004 and mild to moderate MR with mitral valve prolapse. His other issues include tobacco abuse, bipolar disorder, OSA and HLD.He smokes 2 cigs per day and has done so for many years.  Echo from 11/2015with EF of 45 to 50%, moderateLVH, MVP with mild to moderate MR.  Seen for first time by Dr. [name] back in 2014. Cardiac status appeared stable but was short of breath - ordered a Myoview, PFTs and BNP. BNP was 18. Low risk Myoview noted. PFTs without marked abnormality.   I have seen him back over the past few years - I saw him back in January of 2018 - had had some eye surgery and was told he had a "skip" in his heart. He was asymptomatic. Had been out of his beta blocker for at least 6 months - I restarted it. Got his echo updated - EF down more - reviewed with Dr. [name] - set up for Myoview - larger scar noted. Cardiac cath recommended - ended up having PCI.Plan was to update his echo 3 months post PCI.   I then saw him back in Juneof 2018- some "indigestion" reported - but had used NTG with relief.On follow up back in September he had continued to endorse some chest pain. Had been using NTG. Myoview updated- see below.Ended up getting cardiac MRI as well and EF is 47%.Admitsthat he could do better with his CV risk factor modification.When seenback in October of 2018- doing quite well - no chest pain. Had stopped his Lamictal and Lexapro. He was to follow up with psycheand ended upback on his psyche meds. Still smoking about 2 cigs per day.Last  echo with just mild MR noted.   When seen last summer, he was doing ok but the pandemic had been hard on him and his psyche. Smoking a little more. Spending time outside. Cardiac status felt to be ok.I last saw him in July of 2021 - he was doing well - likes to stay outside. Cardiac status ok.   Comes in today. Here with   Past Medical History:  Diagnosis Date  . Anxiety   . Bipolar 1 disorder ([place])   . Bipolar disorder ([place]) 10/05/2009   Qualifier: Diagnosis of  By: [name]    . BPH (benign prostatic hypertrophy)   . CAD (coronary artery disease), native coronary artery    a. DES x 2 to RCA in 2004 b. 02/11/17 PCI w/DES x1 to mRCA  . Coronary atherosclerosis 10/05/2009   Qualifier: Diagnosis of  By: [name]   ANGIOGRAPHIC DATA:  1. Left main coronary artery was free of critical disease.  2. The LAD coursed to the apex. There is mild luminal irregularity  including mild irregularity of the diagonal. No high grade areas of  stenosis were noted. There was faint collateralization of the distal  right coronary circulation.  3. The circumflex provided two major m  . Depression   . Dyspnea 09/20/2012  . Essential and other specified forms of  tremor 02/05/2013  . HYPERCHOLESTEROLEMIA 10/05/2009   Qualifier: Diagnosis of  By: [name]    . Hyperlipidemia   . Memory loss 02/05/2013  . Mitral regurgitation and aortic stenosis   . MITRAL STENOSIS/ INSUFFICIENCY, NON-RHEUMATIC 01/06/2011   Qualifier: Diagnosis of  By: [name], MD, [name] Study Conclusions  - Left ventricle: The cavity size was normal. Wall thickness was increased in a pattern of mild LVH. Systolic function was normal. The estimated ejection fraction was in the range of 55% to 60%. Wall motion was normal; there were no regional wall motion abnormalities. Left ventricular diastolic function parameters were  . MVP (mitral valve prolapse)   . Myocardial infarction ([place])   . Obstructive sleep apnea 02/05/2013   . OSA (obstructive sleep apnea) 03/22/2013  . Parasomnia 02/05/2013  . Sleep apnea   . Sleep disorder with cognitive complaints 10/26/2018  . Unspecified hereditary and idiopathic peripheral neuropathy 02/05/2013    Past Surgical History:  Procedure Laterality Date  . CORONARY STENT INTERVENTION N/A 02/12/2017   Procedure: Coronary Stent Intervention;  Surgeon: [name], MD;  Location: [facility];  Service: Cardiovascular;  Laterality: N/A;  . LEFT HEART CATH AND CORONARY ANGIOGRAPHY N/A 02/12/2017   Procedure: Left Heart Cath and Coronary Angiography;  Surgeon: [name], MD;  Location: [facility];  Service: Cardiovascular;  Laterality: N/A;     Medications: No outpatient medications have been marked as taking for the 01/15/21 encounter (Appointment) with Burtis Junes, NP.   Current Facility-Administered Medications for the 01/15/21 encounter (Appointment) with Burtis Junes, NP  Medication  . 0.9 %  sodium chloride infusion     Allergies: No Known Allergies  Social History: The patient  reports that he has been smoking cigarettes. He has been smoking about 0.10 packs per day. He has never used smokeless tobacco. He reports previous alcohol use. He reports that he does not use drugs.   Family History: The patient's ***family history includes COPD in his father; Colon cancer in his sister; Heart disease in his brother and mother.   Review of Systems: Please see the history of present illness.   All other systems are reviewed and negative.   Physical Exam: VS:  There were no vitals taken for this visit. [name]  BMI There is no height or weight on file to calculate BMI.  Wt Readings from Last 3 Encounters:  10/02/20 184 lb (83.5 kg)  07/11/20 181 lb 6.4 oz (82.3 kg)  11/29/19 182 lb (82.6 kg)    General: Pleasant. Well developed, well nourished and in no acute distress.   HEENT: Normal.  Neck: Supple, no JVD, carotid bruits, or masses noted.   Cardiac: ***Regular rate and rhythm. No murmurs, rubs, or gallops. No edema.  Respiratory:  Lungs are clear to auscultation bilaterally with normal work of breathing.  GI: Soft and nontender.  MS: No deformity or atrophy. Gait and ROM intact.  Skin: Warm and dry. Color is normal.  Neuro:  Strength and sensation are intact and no gross focal deficits noted.  Psych: Alert, appropriate and with normal affect.   LABORATORY DATA:  EKG:  EKG {ACTION; IS/IS [id number]} ordered today.  Personally reviewed by me. This demonstrates ***.  Lab Results  Component Value Date   WBC 7.9 07/11/2020   HGB 13.9 07/11/2020   HCT 40.2 07/11/2020   PLT 213 07/11/2020   GLUCOSE 98 07/11/2020   CHOL 103 08/22/2020   TRIG 75 08/22/2020  HDL 39 (L) 08/22/2020   LDLCALC 48 08/22/2020   ALT 16 08/22/2020   AST 17 08/22/2020   NA 141 07/11/2020   K 4.8 07/11/2020   CL 104 07/11/2020   CREATININE 0.94 07/11/2020   BUN 13 07/11/2020   CO2 24 07/11/2020   TSH 2.350 10/02/2020   INR 1.0 02/09/2017     BNP (last 3 results) No results for input(s): BNP in the last 8760 hours.  ProBNP (last 3 results) No results for input(s): PROBNP in the last 8760 hours.   Other Studies Reviewed Today:  ECHO Study Conclusions 09/2018  - Left ventricle: The cavity size was normal. Systolic function was  normal. The estimated ejection fraction was in the range of 50%  to 55%. Wall motion was normal; there were no regional wall  motion abnormalities. Doppler parameters are consistent with  abnormal left ventricular relaxation (grade 1 diastolic  dysfunction). Doppler parameters are consistent with  indeterminate ventricular filling pressure.  - Aortic valve: Transvalvular velocity was within the normal range.  There was no stenosis. There was no regurgitation.  - Mitral valve: Mild prolapse, involving the anterior leaflet and  the posterior leaflet. Transvalvular velocity was within the   normal range. There was no evidence for stenosis. There was mild  regurgitation.  - Left atrium: The atrium was moderately dilated.  - Right ventricle: The cavity size was normal. Wall thickness was  normal. Systolic function was normal.  - Atrial septum: No defect or patent foramen ovale was identified.  - Tricuspid valve: There was trivial regurgitation.  - Pulmonary arteries: Systolic pressure was within the normal  range. PA peak pressure: 19 mm Hg (S).   ------------------------------------------------------------------- CARDIAC MRIIMPRESSION11/2018: 1. Normal LV size with wall motion abnormalities noted above. EF 47%.Delayed enhancement images suggestive of prior infarction in the RCA territory.  2. Normal RV size and systolic function.  3. Mitral valve prolapse with mild to moderate mitral regurgitation.  Electronically Signed By: [name] M.D. On: 10/23/2017 16:43  MyoviewStudy Highlights9/2018   Nuclear stress EF: 37%.  Blood pressure demonstrated a normal response to exercise.  There was no ST segment deviation noted during stress.  No T wave inversion was noted during stress.  Defect 1: There is a small defect of moderate severity present in the basal inferior and mid inferior location.  Findings consistent with prior myocardial infarction. No ischemia.  The left ventricular ejection fraction is moderately decreased (30-44%).    Notes recorded by Burtis Junes, NP on 09/01/2017 at 7:51 AM EDT Ok to report. Dr. [name] has reviewed his Myoview - EF while low - same as prior study - would like to see back in about a month to recheck him. May consider cardiac MRI to get a better sense of his pumping function. For now, stay on current regimen as outlined at his visit.    Coronary Stent Intervention 02/2017  Conclusion     Mid RCA lesion, 90 %stenosed.  A STENT RESOLUTE ONYX 4.0X12 drug eluting stent was successfully  placed.  Prox RCA to Mid RCA lesion, 90 %stenosed.  Post intervention, there is a 0% residual stenosis.  Successful stenting of the mid RCA with DES for in stent restenosis.  Plan: DAPT for one year with ASA and Plavix. May be suitable for same day discharge.     Assessment/Plan:  1. CAD - s/p PCI to RCA from March of 2018 - has completed DAPT - no ischemia on Myoview from 2018. No actual  chest pain prior to his PCI. Continues to be managed medically. No worrisome issues/concerns.  He is on long acting nitrate therapy. Sl NTG refilled for him today.   2. HTN - BP is great on his current regimen. Reminded to stay hydrated. We will continue with his current regimen of Metoprolol.   3. Tobacco abuse - total cessation encouraged.   4. Bipolar disorder - per Psyche at [facility].   5. HLD - on high intensity statin - needs labs today - would continue Lipitor.   6. Prior LV dysfunction - EF of 47% by prior cardiac MRI - this has normalized by echo from 2019.   7. MVP - mild MR - would follow. No significant murmur on exam.     Current medicines are reviewed with the patient today.  The patient does not have concerns regarding medicines other than what has been noted above.  The following changes have been made:  See above.  Labs/ tests ordered today include:   No orders of the defined types were placed in this encounter.    Disposition:   FU with *** in {gen number [id number] {Days to years:10300}.   Patient is agreeable to this plan and will call if any problems develop in the interim.   SignedTruitt Merle, NP  01/14/2021 9:10 AM  [facility] [address] [place], [place]  [address] Phone: [phone] Fax: [phone]

## 2021-01-08 ENCOUNTER — Ambulatory Visit: Payer: Medicare HMO | Admitting: Psychiatry

## 2021-01-09 ENCOUNTER — Other Ambulatory Visit: Payer: Self-pay | Admitting: Internal Medicine

## 2021-01-09 ENCOUNTER — Other Ambulatory Visit: Payer: Medicare HMO

## 2021-01-09 DIAGNOSIS — Z20822 Contact with and (suspected) exposure to covid-19: Secondary | ICD-10-CM

## 2021-01-10 LAB — SPECIMEN STATUS REPORT

## 2021-01-10 LAB — SARS-COV-2, NAA 2 DAY TAT

## 2021-01-10 LAB — NOVEL CORONAVIRUS, NAA: SARS-CoV-2, NAA: NOT DETECTED

## 2021-01-11 ENCOUNTER — Other Ambulatory Visit: Payer: Self-pay | Admitting: Physician Assistant

## 2021-01-11 ENCOUNTER — Telehealth: Payer: Medicare HMO

## 2021-01-13 DIAGNOSIS — G44209 Tension-type headache, unspecified, not intractable: Secondary | ICD-10-CM | POA: Diagnosis not present

## 2021-01-13 DIAGNOSIS — R112 Nausea with vomiting, unspecified: Secondary | ICD-10-CM | POA: Diagnosis not present

## 2021-01-13 DIAGNOSIS — R197 Diarrhea, unspecified: Secondary | ICD-10-CM | POA: Diagnosis not present

## 2021-01-13 DIAGNOSIS — R519 Headache, unspecified: Secondary | ICD-10-CM | POA: Diagnosis not present

## 2021-01-13 DIAGNOSIS — J00 Acute nasopharyngitis [common cold]: Secondary | ICD-10-CM | POA: Diagnosis not present

## 2021-01-13 DIAGNOSIS — J3489 Other specified disorders of nose and nasal sinuses: Secondary | ICD-10-CM | POA: Diagnosis not present

## 2021-01-14 ENCOUNTER — Ambulatory Visit: Payer: Medicare HMO | Admitting: Nurse Practitioner

## 2021-01-15 ENCOUNTER — Ambulatory Visit: Payer: Medicare HMO | Admitting: Nurse Practitioner

## 2021-01-15 ENCOUNTER — Telehealth: Payer: Medicare HMO

## 2021-01-16 ENCOUNTER — Ambulatory Visit: Payer: Medicare HMO | Admitting: Psychiatry

## 2021-01-21 ENCOUNTER — Encounter: Payer: Self-pay | Admitting: Family Medicine

## 2021-01-21 ENCOUNTER — Ambulatory Visit (INDEPENDENT_AMBULATORY_CARE_PROVIDER_SITE_OTHER): Payer: Medicare HMO | Admitting: Family Medicine

## 2021-01-21 DIAGNOSIS — G43911 Migraine, unspecified, intractable, with status migrainosus: Secondary | ICD-10-CM | POA: Diagnosis not present

## 2021-01-21 MED ORDER — RIZATRIPTAN BENZOATE 10 MG PO TABS: 10.0000 mg | ORAL_TABLET | ORAL | 0 refills | Status: DC | PRN

## 2021-01-21 NOTE — Progress Notes (Signed)
   Virtual Visit via telephone Note Due to COVID-19 pandemic this visit was conducted virtually. This visit type was conducted due to national recommendations for restrictions regarding the COVID-19 Pandemic (e.g. social distancing, sheltering in place) in an effort to limit this patient's exposure and mitigate transmission in our community. All issues noted in this document were discussed and addressed.  A physical exam was not performed with this format.  I connected with Derrick [name] on 01/21/21 at [phone] by telephone and verified that I am speaking with the correct person using two identifiers. Derrick [name] is currently located at home and no one is currently with him during the visit. The provider, [name], [name] is located in their office at time of visit.  I discussed the limitations, risks, security and privacy concerns of performing an evaluation and management service by telephone and the availability of in person appointments. I also discussed with the patient that there may be a patient responsible charge related to this service. The patient expressed understanding and agreed to proceed.  CC: headache History and Present Illness:  HPI  Derrick [name] reports headaches for the last week. He was seen at [facility] on 01/14/21 and given a injection for his headache. This did help for a few days but now his headache is back. The headache is on the left side and feels like a pressure. It is moderate. He has tried Excedrin migraine with a little improvement. He denies fever, nausea, vomiting, light or sound sensitivity,  congestion, confusion, changes in balance or vision.    ROS As per HPI.   Observations/Objective: Alert and oriented x 3. Able to speak in full sentences without difficulty.    Assessment and Plan: Derrick [name] was seen today for headache.  Diagnoses and all orders for this visit:  Intractable migraine with status migrainosus, unspecified migraine type Will treat as a migraine  headache. May take aleve or tylenol with triptan. Return to office for new or worsening symptoms, or if symptoms persist.  -     rizatriptan (MAXALT) 10 MG tablet; Take 1 tablet (10 mg total) by mouth as needed for migraine. May repeat in 2 hours if needed  Follow Up Instructions: As needed.     I discussed the assessment and treatment plan with the patient. The patient was provided an opportunity to ask questions and all were answered. The patient agreed with the plan and demonstrated an understanding of the instructions.   The patient was advised to call back or seek an in-person evaluation if the symptoms worsen or if the condition fails to improve as anticipated.  The above assessment and management plan was discussed with the patient. The patient verbalized understanding of and has agreed to the management plan. Patient is aware to call the clinic if symptoms persist or worsen. Patient is aware when to return to the clinic for a follow-up visit. Patient educated on when it is appropriate to go to the emergency department.   Time call ended:  Protection, [name]

## 2021-01-28 ENCOUNTER — Ambulatory Visit (HOSPITAL_COMMUNITY)
Admission: RE | Admit: 2021-01-28 | Discharge: 2021-01-28 | Disposition: A | Discharge status: Home / Self Care | Payer: Medicare HMO | Source: Ambulatory Visit | Attending: Family Medicine | Admitting: Family Medicine

## 2021-01-28 ENCOUNTER — Encounter: Payer: Self-pay | Admitting: Family Medicine

## 2021-01-28 ENCOUNTER — Other Ambulatory Visit (HOSPITAL_COMMUNITY): Payer: Self-pay

## 2021-01-28 ENCOUNTER — Inpatient Hospital Stay (HOSPITAL_COMMUNITY)
Admission: EM | Admit: 2021-01-28 | Discharge: 2021-01-30 | DRG: 025 | Disposition: A | Discharge status: Home / Self Care | Payer: Medicare HMO | Attending: Neurological Surgery | Admitting: Neurological Surgery

## 2021-01-28 ENCOUNTER — Other Ambulatory Visit: Payer: Self-pay

## 2021-01-28 ENCOUNTER — Emergency Department (HOSPITAL_COMMUNITY): Payer: Medicare HMO | Admitting: Anesthesiology

## 2021-01-28 ENCOUNTER — Ambulatory Visit (INDEPENDENT_AMBULATORY_CARE_PROVIDER_SITE_OTHER): Payer: Medicare HMO | Admitting: Family Medicine

## 2021-01-28 ENCOUNTER — Encounter (HOSPITAL_COMMUNITY)
Admission: EM | Disposition: A | Discharge status: Home / Self Care | Payer: Self-pay | Source: Home / Self Care | Attending: Neurological Surgery

## 2021-01-28 ENCOUNTER — Other Ambulatory Visit: Payer: Self-pay | Admitting: Family Medicine

## 2021-01-28 ENCOUNTER — Encounter (HOSPITAL_COMMUNITY): Payer: Self-pay

## 2021-01-28 VITALS — BP 122/67 | HR 106 | Temp 96.7°F | Ht 71.0 in | Wt 171.4 lb

## 2021-01-28 DIAGNOSIS — E785 Hyperlipidemia, unspecified: Secondary | ICD-10-CM | POA: Diagnosis present

## 2021-01-28 DIAGNOSIS — R519 Headache, unspecified: Secondary | ICD-10-CM

## 2021-01-28 DIAGNOSIS — R410 Disorientation, unspecified: Secondary | ICD-10-CM

## 2021-01-28 DIAGNOSIS — I2510 Atherosclerotic heart disease of native coronary artery without angina pectoris: Secondary | ICD-10-CM | POA: Diagnosis not present

## 2021-01-28 DIAGNOSIS — R2689 Other abnormalities of gait and mobility: Secondary | ICD-10-CM

## 2021-01-28 DIAGNOSIS — G4489 Other headache syndrome: Secondary | ICD-10-CM | POA: Diagnosis not present

## 2021-01-28 DIAGNOSIS — Z8249 Family history of ischemic heart disease and other diseases of the circulatory system: Secondary | ICD-10-CM | POA: Diagnosis not present

## 2021-01-28 DIAGNOSIS — G935 Compression of brain: Secondary | ICD-10-CM | POA: Diagnosis present

## 2021-01-28 DIAGNOSIS — R296 Repeated falls: Secondary | ICD-10-CM | POA: Diagnosis present

## 2021-01-28 DIAGNOSIS — Z20822 Contact with and (suspected) exposure to covid-19: Secondary | ICD-10-CM | POA: Diagnosis present

## 2021-01-28 DIAGNOSIS — I252 Old myocardial infarction: Secondary | ICD-10-CM | POA: Diagnosis not present

## 2021-01-28 DIAGNOSIS — F418 Other specified anxiety disorders: Secondary | ICD-10-CM | POA: Diagnosis not present

## 2021-01-28 DIAGNOSIS — Z79899 Other long term (current) drug therapy: Secondary | ICD-10-CM

## 2021-01-28 DIAGNOSIS — H5500 Unspecified nystagmus: Secondary | ICD-10-CM

## 2021-01-28 DIAGNOSIS — Z800 Family history of malignant neoplasm of digestive organs: Secondary | ICD-10-CM | POA: Diagnosis not present

## 2021-01-28 DIAGNOSIS — Z955 Presence of coronary angioplasty implant and graft: Secondary | ICD-10-CM

## 2021-01-28 DIAGNOSIS — R251 Tremor, unspecified: Secondary | ICD-10-CM | POA: Diagnosis present

## 2021-01-28 DIAGNOSIS — Z825 Family history of asthma and other chronic lower respiratory diseases: Secondary | ICD-10-CM

## 2021-01-28 DIAGNOSIS — Z7982 Long term (current) use of aspirin: Secondary | ICD-10-CM | POA: Diagnosis not present

## 2021-01-28 DIAGNOSIS — F419 Anxiety disorder, unspecified: Secondary | ICD-10-CM | POA: Diagnosis present

## 2021-01-28 DIAGNOSIS — S065X9A Traumatic subdural hemorrhage with loss of consciousness of unspecified duration, initial encounter: Secondary | ICD-10-CM | POA: Diagnosis not present

## 2021-01-28 DIAGNOSIS — R58 Hemorrhage, not elsewhere classified: Secondary | ICD-10-CM | POA: Diagnosis not present

## 2021-01-28 DIAGNOSIS — S065X0A Traumatic subdural hemorrhage without loss of consciousness, initial encounter: Secondary | ICD-10-CM | POA: Diagnosis not present

## 2021-01-28 DIAGNOSIS — I6200 Nontraumatic subdural hemorrhage, unspecified: Secondary | ICD-10-CM | POA: Diagnosis not present

## 2021-01-28 DIAGNOSIS — G4700 Insomnia, unspecified: Secondary | ICD-10-CM | POA: Diagnosis not present

## 2021-01-28 DIAGNOSIS — G4733 Obstructive sleep apnea (adult) (pediatric): Secondary | ICD-10-CM | POA: Diagnosis not present

## 2021-01-28 DIAGNOSIS — E7800 Pure hypercholesterolemia, unspecified: Secondary | ICD-10-CM | POA: Diagnosis not present

## 2021-01-28 DIAGNOSIS — Z9889 Other specified postprocedural states: Secondary | ICD-10-CM

## 2021-01-28 DIAGNOSIS — R220 Localized swelling, mass and lump, head: Secondary | ICD-10-CM | POA: Diagnosis not present

## 2021-01-28 DIAGNOSIS — F319 Bipolar disorder, unspecified: Secondary | ICD-10-CM | POA: Diagnosis present

## 2021-01-28 DIAGNOSIS — G9389 Other specified disorders of brain: Secondary | ICD-10-CM | POA: Diagnosis not present

## 2021-01-28 DIAGNOSIS — I10 Essential (primary) hypertension: Secondary | ICD-10-CM | POA: Diagnosis present

## 2021-01-28 DIAGNOSIS — N400 Enlarged prostate without lower urinary tract symptoms: Secondary | ICD-10-CM | POA: Diagnosis present

## 2021-01-28 DIAGNOSIS — I619 Nontraumatic intracerebral hemorrhage, unspecified: Secondary | ICD-10-CM | POA: Diagnosis not present

## 2021-01-28 DIAGNOSIS — F17210 Nicotine dependence, cigarettes, uncomplicated: Secondary | ICD-10-CM | POA: Diagnosis present

## 2021-01-28 DIAGNOSIS — S065XAA Traumatic subdural hemorrhage with loss of consciousness status unknown, initial encounter: Secondary | ICD-10-CM

## 2021-01-28 DIAGNOSIS — R42 Dizziness and giddiness: Secondary | ICD-10-CM | POA: Diagnosis not present

## 2021-01-28 HISTORY — PX: CRANIOTOMY: SHX93

## 2021-01-28 LAB — CBC WITH DIFFERENTIAL/PLATELET
Abs Immature Granulocytes: 0.04 10*3/uL (ref 0.00–0.07)
Basophils Absolute: 0 10*3/uL (ref 0.0–0.1)
Basophils Relative: 0 %
Eosinophils Absolute: 0 10*3/uL (ref 0.0–0.5)
Eosinophils Relative: 0 %
HCT: 42.5 % (ref 39.0–52.0)
Hemoglobin: 14.4 g/dL (ref 13.0–17.0)
Immature Granulocytes: 0 %
Lymphocytes Relative: 16 %
Lymphs Abs: 1.6 10*3/uL (ref 0.7–4.0)
MCH: 33 pg (ref 26.0–34.0)
MCHC: 33.9 g/dL (ref 30.0–36.0)
MCV: 97.5 fL (ref 80.0–100.0)
Monocytes Absolute: 0.8 10*3/uL (ref 0.1–1.0)
Monocytes Relative: 8 %
Neutro Abs: 7.5 10*3/uL (ref 1.7–7.7)
Neutrophils Relative %: 76 %
Platelets: 359 10*3/uL (ref 150–400)
RBC: 4.36 MIL/uL (ref 4.22–5.81)
RDW: 12.7 % (ref 11.5–15.5)
WBC: 10.1 10*3/uL (ref 4.0–10.5)
nRBC: 0 % (ref 0.0–0.2)

## 2021-01-28 LAB — COMPREHENSIVE METABOLIC PANEL
ALT: 41 U/L (ref 0–44)
AST: 23 U/L (ref 15–41)
Albumin: 4.2 g/dL (ref 3.5–5.0)
Alkaline Phosphatase: 88 U/L (ref 38–126)
Anion gap: 9 (ref 5–15)
BUN: 17 mg/dL (ref 8–23)
CO2: 26 mmol/L (ref 22–32)
Calcium: 9.6 mg/dL (ref 8.9–10.3)
Chloride: 99 mmol/L (ref 98–111)
Creatinine, Ser: 0.88 mg/dL (ref 0.61–1.24)
GFR, Estimated: >60 mL/min (ref >60)
Glucose, Bld: 153 mg/dL — ABNORMAL HIGH (ref 70–99)
Potassium: 3.6 mmol/L (ref 3.5–5.1)
Sodium: 134 mmol/L — ABNORMAL LOW (ref 135–145)
Total Bilirubin: 0.6 mg/dL (ref 0.3–1.2)
Total Protein: 7.8 g/dL (ref 6.5–8.1)

## 2021-01-28 LAB — TYPE AND SCREEN
ABO/RH(D): O POS
Antibody Screen: NEGATIVE

## 2021-01-28 LAB — GLUCOSE, CAPILLARY
Glucose-Capillary: 101 mg/dL — ABNORMAL HIGH (ref 70–99)
Glucose-Capillary: 155 mg/dL — ABNORMAL HIGH (ref 70–99)

## 2021-01-28 LAB — ABO/RH: ABO/RH(D): O POS

## 2021-01-28 LAB — RESP PANEL BY RT-PCR (FLU A&B, COVID) ARPGX2
Influenza A by PCR: NEGATIVE
Influenza B by PCR: NEGATIVE
SARS Coronavirus 2 by RT PCR: NEGATIVE

## 2021-01-28 LAB — MRSA PCR SCREENING: MRSA by PCR: NEGATIVE

## 2021-01-28 LAB — PROTIME-INR
INR: 1.1 (ref 0.8–1.2)
Prothrombin Time: 13.4 seconds (ref 11.4–15.2)

## 2021-01-28 LAB — CBG MONITORING, ED: Glucose-Capillary: 178 mg/dL — ABNORMAL HIGH (ref 70–99)

## 2021-01-28 SURGERY — CRANIOTOMY HEMATOMA EVACUATION SUBDURAL
Anesthesia: General | Site: Head | Laterality: Left

## 2021-01-28 MED ORDER — ACETAMINOPHEN 325 MG PO TABS: 650.0000 mg | ORAL_TABLET | ORAL | Status: DC | PRN

## 2021-01-28 MED ORDER — FENTANYL CITRATE (PF) 100 MCG/2ML IJ SOLN
INTRAMUSCULAR | Status: DC | PRN
  Administered 2021-01-28: 50 ug via INTRAVENOUS
  Administered 2021-01-28: 100 ug via INTRAVENOUS
  Administered 2021-01-28: 50 ug via INTRAVENOUS

## 2021-01-28 MED ORDER — DEXAMETHASONE SODIUM PHOSPHATE 10 MG/ML IJ SOLN
INTRAMUSCULAR | Status: AC
  Filled 2021-01-28: qty 1

## 2021-01-28 MED ORDER — ONDANSETRON HCL 4 MG PO TABS: 4.0000 mg | ORAL_TABLET | ORAL | Status: DC | PRN

## 2021-01-28 MED ORDER — ROCURONIUM BROMIDE 10 MG/ML (PF) SYRINGE
PREFILLED_SYRINGE | INTRAVENOUS | Status: AC
  Filled 2021-01-28: qty 10

## 2021-01-28 MED ORDER — METOPROLOL TARTRATE 25 MG PO TABS
25.0000 mg | ORAL_TABLET | Freq: Two times a day (BID) | ORAL | Status: DC
  Administered 2021-01-28 – 2021-01-30 (×4): 25 mg via ORAL
  Filled 2021-01-28 (×4): qty 1

## 2021-01-28 MED ORDER — CEFAZOLIN SODIUM-DEXTROSE 2-3 GM-%(50ML) IV SOLR
INTRAVENOUS | Status: DC | PRN
  Administered 2021-01-28: 2 g via INTRAVENOUS

## 2021-01-28 MED ORDER — ESMOLOL HCL 100 MG/10ML IV SOLN
INTRAVENOUS | Status: DC | PRN
  Administered 2021-01-28: 30 mg via INTRAVENOUS

## 2021-01-28 MED ORDER — DEXAMETHASONE SODIUM PHOSPHATE 4 MG/ML IJ SOLN
4.0000 mg | Freq: Four times a day (QID) | INTRAMUSCULAR | Status: DC
  Administered 2021-01-29 – 2021-01-30 (×3): 4 mg via INTRAVENOUS
  Filled 2021-01-28 (×3): qty 1

## 2021-01-28 MED ORDER — PANTOPRAZOLE SODIUM 40 MG PO TBEC
40.0000 mg | DELAYED_RELEASE_TABLET | Freq: Every day | ORAL | Status: DC
  Administered 2021-01-29 – 2021-01-30 (×2): 40 mg via ORAL
  Filled 2021-01-28 (×2): qty 1

## 2021-01-28 MED ORDER — ONDANSETRON HCL 4 MG/2ML IJ SOLN
INTRAMUSCULAR | Status: AC
  Filled 2021-01-28: qty 2

## 2021-01-28 MED ORDER — THROMBIN 5000 UNITS EX SOLR
CUTANEOUS | Status: AC
  Filled 2021-01-28: qty 5000

## 2021-01-28 MED ORDER — POTASSIUM CHLORIDE IN NACL 20-0.9 MEQ/L-% IV SOLN
INTRAVENOUS | Status: DC
  Filled 2021-01-28: qty 1000

## 2021-01-28 MED ORDER — 0.9 % SODIUM CHLORIDE (POUR BTL) OPTIME
TOPICAL | Status: DC | PRN
  Administered 2021-01-28 (×3): 1000 mL

## 2021-01-28 MED ORDER — LABETALOL HCL 5 MG/ML IV SOLN: 10.0000 mg | INTRAVENOUS | Status: DC | PRN

## 2021-01-28 MED ORDER — THROMBIN 20000 UNITS EX SOLR: CUTANEOUS | Status: DC | PRN

## 2021-01-28 MED ORDER — FENTANYL CITRATE (PF) 250 MCG/5ML IJ SOLN
INTRAMUSCULAR | Status: AC
  Filled 2021-01-28: qty 5

## 2021-01-28 MED ORDER — CHLORHEXIDINE GLUCONATE CLOTH 2 % EX PADS
6.0000 | MEDICATED_PAD | Freq: Every day | CUTANEOUS | Status: DC
  Administered 2021-01-28: 6 via TOPICAL

## 2021-01-28 MED ORDER — CEFAZOLIN SODIUM-DEXTROSE 2-4 GM/100ML-% IV SOLN
INTRAVENOUS | Status: AC
  Filled 2021-01-28: qty 100

## 2021-01-28 MED ORDER — PROPOFOL 10 MG/ML IV BOLUS
INTRAVENOUS | Status: AC
  Filled 2021-01-28: qty 20

## 2021-01-28 MED ORDER — SODIUM CHLORIDE 0.9 % IV SOLN: INTRAVENOUS | Status: DC | PRN

## 2021-01-28 MED ORDER — ATORVASTATIN CALCIUM 80 MG PO TABS
80.0000 mg | ORAL_TABLET | Freq: Every day | ORAL | Status: DC
  Administered 2021-01-29 – 2021-01-30 (×2): 80 mg via ORAL
  Filled 2021-01-28 (×2): qty 1

## 2021-01-28 MED ORDER — SUGAMMADEX SODIUM 200 MG/2ML IV SOLN
INTRAVENOUS | Status: DC | PRN
  Administered 2021-01-28: 300 mg via INTRAVENOUS

## 2021-01-28 MED ORDER — TAMSULOSIN HCL 0.4 MG PO CAPS
0.4000 mg | ORAL_CAPSULE | Freq: Every day | ORAL | Status: DC
  Administered 2021-01-29 – 2021-01-30 (×2): 0.4 mg via ORAL
  Filled 2021-01-28 (×2): qty 1

## 2021-01-28 MED ORDER — THROMBIN 20000 UNITS EX SOLR
CUTANEOUS | Status: AC
  Filled 2021-01-28: qty 20000

## 2021-01-28 MED ORDER — LAMOTRIGINE 100 MG PO TABS
150.0000 mg | ORAL_TABLET | Freq: Every day | ORAL | Status: DC
  Administered 2021-01-29 – 2021-01-30 (×2): 150 mg via ORAL
  Filled 2021-01-28 (×2): qty 2

## 2021-01-28 MED ORDER — MONTELUKAST SODIUM 10 MG PO TABS
10.0000 mg | ORAL_TABLET | Freq: Every day | ORAL | Status: DC
  Administered 2021-01-29 – 2021-01-30 (×2): 10 mg via ORAL
  Filled 2021-01-28 (×2): qty 1

## 2021-01-28 MED ORDER — ESCITALOPRAM OXALATE 10 MG PO TABS
20.0000 mg | ORAL_TABLET | Freq: Every day | ORAL | Status: DC
  Administered 2021-01-29 – 2021-01-30 (×2): 20 mg via ORAL
  Filled 2021-01-28 (×2): qty 2

## 2021-01-28 MED ORDER — ONDANSETRON HCL 4 MG/2ML IJ SOLN
INTRAMUSCULAR | Status: DC | PRN
  Administered 2021-01-28: 4 mg via INTRAVENOUS

## 2021-01-28 MED ORDER — DEXAMETHASONE SODIUM PHOSPHATE 10 MG/ML IJ SOLN
INTRAMUSCULAR | Status: DC | PRN
  Administered 2021-01-28: 8 mg via INTRAVENOUS

## 2021-01-28 MED ORDER — HYDROCODONE-ACETAMINOPHEN 5-325 MG PO TABS
1.0000 | ORAL_TABLET | ORAL | Status: DC | PRN
  Administered 2021-01-28 – 2021-01-30 (×6): 1 via ORAL
  Filled 2021-01-28 (×6): qty 1

## 2021-01-28 MED ORDER — LIDOCAINE 2% (20 MG/ML) 5 ML SYRINGE
INTRAMUSCULAR | Status: DC | PRN
  Administered 2021-01-28: 40 mg via INTRAVENOUS

## 2021-01-28 MED ORDER — ONDANSETRON HCL 4 MG/2ML IJ SOLN: 4.0000 mg | INTRAMUSCULAR | Status: DC | PRN

## 2021-01-28 MED ORDER — BACITRACIN ZINC 500 UNIT/GM EX OINT
TOPICAL_OINTMENT | CUTANEOUS | Status: DC | PRN
  Administered 2021-01-28: 1 via TOPICAL

## 2021-01-28 MED ORDER — PHENYLEPHRINE HCL-NACL 10-0.9 MG/250ML-% IV SOLN
INTRAVENOUS | Status: DC | PRN
  Administered 2021-01-28: 20 ug/min via INTRAVENOUS

## 2021-01-28 MED ORDER — SENNA 8.6 MG PO TABS
1.0000 | ORAL_TABLET | Freq: Two times a day (BID) | ORAL | Status: DC
  Administered 2021-01-28 – 2021-01-30 (×4): 8.6 mg via ORAL
  Filled 2021-01-28 (×4): qty 1

## 2021-01-28 MED ORDER — LIDOCAINE-EPINEPHRINE 1 %-1:100000 IJ SOLN
INTRAMUSCULAR | Status: AC
  Filled 2021-01-28: qty 1

## 2021-01-28 MED ORDER — CEFAZOLIN SODIUM-DEXTROSE 1-4 GM/50ML-% IV SOLN
1.0000 g | Freq: Three times a day (TID) | INTRAVENOUS | Status: AC
Start: 2021-01-29 — End: 2021-01-29
  Administered 2021-01-29 (×2): 1 g via INTRAVENOUS
  Filled 2021-01-28 (×2): qty 50

## 2021-01-28 MED ORDER — MORPHINE SULFATE (PF) 2 MG/ML IV SOLN: 1.0000 mg | INTRAVENOUS | Status: DC | PRN

## 2021-01-28 MED ORDER — ISOSORBIDE MONONITRATE ER 30 MG PO TB24
15.0000 mg | ORAL_TABLET | Freq: Every day | ORAL | Status: DC
  Administered 2021-01-29 – 2021-01-30 (×2): 15 mg via ORAL
  Filled 2021-01-28 (×2): qty 1

## 2021-01-28 MED ORDER — LIDOCAINE 2% (20 MG/ML) 5 ML SYRINGE
INTRAMUSCULAR | Status: AC
  Filled 2021-01-28: qty 5

## 2021-01-28 MED ORDER — DEXAMETHASONE SODIUM PHOSPHATE 10 MG/ML IJ SOLN
6.0000 mg | Freq: Four times a day (QID) | INTRAMUSCULAR | Status: AC
  Administered 2021-01-29 (×4): 6 mg via INTRAVENOUS
  Filled 2021-01-28 (×3): qty 1
  Filled 2021-01-28: qty 0.6

## 2021-01-28 MED ORDER — PHENYLEPHRINE HCL (PRESSORS) 10 MG/ML IV SOLN
INTRAVENOUS | Status: DC | PRN
  Administered 2021-01-28: 120 ug via INTRAVENOUS

## 2021-01-28 MED ORDER — DEXAMETHASONE SODIUM PHOSPHATE 4 MG/ML IJ SOLN: 4.0000 mg | Freq: Three times a day (TID) | INTRAMUSCULAR | Status: DC

## 2021-01-28 MED ORDER — DIVALPROEX SODIUM 500 MG PO DR TAB
2000.0000 mg | DELAYED_RELEASE_TABLET | Freq: Every day | ORAL | Status: DC
  Administered 2021-01-28 – 2021-01-29 (×2): 2000 mg via ORAL
  Filled 2021-01-28 (×3): qty 4

## 2021-01-28 MED ORDER — LIDOCAINE-EPINEPHRINE 1 %-1:100000 IJ SOLN
INTRAMUSCULAR | Status: DC | PRN
  Administered 2021-01-28: 10 mL

## 2021-01-28 MED ORDER — BACITRACIN ZINC 500 UNIT/GM EX OINT
TOPICAL_OINTMENT | CUTANEOUS | Status: AC
  Filled 2021-01-28: qty 28.35

## 2021-01-28 MED ORDER — PHENYLEPHRINE 40 MCG/ML (10ML) SYRINGE FOR IV PUSH (FOR BLOOD PRESSURE SUPPORT)
PREFILLED_SYRINGE | INTRAVENOUS | Status: AC
  Filled 2021-01-28: qty 10

## 2021-01-28 MED ORDER — PROPOFOL 10 MG/ML IV BOLUS
INTRAVENOUS | Status: DC | PRN
  Administered 2021-01-28: 200 mg via INTRAVENOUS
  Administered 2021-01-28: 50 mg via INTRAVENOUS

## 2021-01-28 MED ORDER — EZETIMIBE 10 MG PO TABS
10.0000 mg | ORAL_TABLET | Freq: Every day | ORAL | Status: DC
  Administered 2021-01-29 – 2021-01-30 (×2): 10 mg via ORAL
  Filled 2021-01-28 (×2): qty 1

## 2021-01-28 MED ORDER — ROCURONIUM BROMIDE 10 MG/ML (PF) SYRINGE
PREFILLED_SYRINGE | INTRAVENOUS | Status: DC | PRN
  Administered 2021-01-28: 20 mg via INTRAVENOUS
  Administered 2021-01-28: 60 mg via INTRAVENOUS

## 2021-01-28 MED ORDER — ACETAMINOPHEN 650 MG RE SUPP: 650.0000 mg | RECTAL | Status: DC | PRN

## 2021-01-28 MED ORDER — THROMBIN 5000 UNITS EX SOLR: OROMUCOSAL | Status: DC | PRN

## 2021-01-28 SURGICAL SUPPLY — 47 items
BUR SPIRAL ROUTER 2.3 (BUR) ×2 IMPLANT
CANISTER SUCT 3000ML PPV (MISCELLANEOUS) ×2 IMPLANT
CATH VENTRIC 35X38 W/TROCAR LG (CATHETERS) ×2 IMPLANT
DRAPE NEUROLOGICAL W/INCISE (DRAPES) ×2 IMPLANT
DRAPE SURG 17X23 STRL (DRAPES) IMPLANT
DRAPE WARM FLUID 44X44 (DRAPES) ×2 IMPLANT
DURAPREP 6ML APPLICATOR 50/CS (WOUND CARE) ×2 IMPLANT
ELECT REM PT RETURN 9FT ADLT (ELECTROSURGICAL) ×4
ELECTRODE REM PT RTRN 9FT ADLT (ELECTROSURGICAL) ×2 IMPLANT
EVACUATOR 1/8 PVC DRAIN (DRAIN) ×2 IMPLANT
GAUZE SPONGE 4X4 12PLY STRL (GAUZE/BANDAGES/DRESSINGS) ×2 IMPLANT
GLOVE BIO SURGEON STRL SZ7 (GLOVE) ×4 IMPLANT
GLOVE BIO SURGEON STRL SZ8 (GLOVE) ×4 IMPLANT
GLOVE BIOGEL M 6.5 STRL (GLOVE) ×2 IMPLANT
GLOVE BIOGEL M 7.0 STRL (GLOVE) ×2 IMPLANT
GLOVE SURG UNDER POLY LF SZ7 (GLOVE) ×4 IMPLANT
GOWN STRL REUS W/ TWL LRG LVL3 (GOWN DISPOSABLE) ×3 IMPLANT
GOWN STRL REUS W/ TWL XL LVL3 (GOWN DISPOSABLE) ×2 IMPLANT
GOWN STRL REUS W/TWL 2XL LVL3 (GOWN DISPOSABLE) IMPLANT
GOWN STRL REUS W/TWL LRG LVL3 (GOWN DISPOSABLE) ×6
GOWN STRL REUS W/TWL XL LVL3 (GOWN DISPOSABLE) ×4
HEMOSTAT POWDER KIT SURGIFOAM (HEMOSTASIS) ×2 IMPLANT
KIT BASIN OR (CUSTOM PROCEDURE TRAY) ×2 IMPLANT
KIT DRAIN CSF ACCUDRAIN (MISCELLANEOUS) ×2 IMPLANT
KIT TURNOVER KIT B (KITS) ×2 IMPLANT
NEEDLE HYPO 22GX1.5 SAFETY (NEEDLE) ×2 IMPLANT
NS IRRIG 1000ML POUR BTL (IV SOLUTION) ×6 IMPLANT
PACK CRANIOTOMY CUSTOM (CUSTOM PROCEDURE TRAY) ×2 IMPLANT
PATTIES SURGICAL 1X1 (DISPOSABLE) IMPLANT
PERFORATOR LRG  14-11MM (BIT) ×2
PERFORATOR LRG 14-11MM (BIT) ×1 IMPLANT
PIN MAYFIELD SKULL DISP (PIN) ×2 IMPLANT
PLATE BONE 12 2H TARGET XL (Plate) ×4 IMPLANT
PLATE CRANIAL 20 W/TAB F/SHN (Plate) ×2 IMPLANT
SCREW UNIII AXS SD 1.5X4 (Screw) ×18 IMPLANT
SPONGE NEURO XRAY DETECT 1X3 (DISPOSABLE) IMPLANT
SPONGE SURGIFOAM ABS GEL 100 (HEMOSTASIS) ×4 IMPLANT
STAPLER VISISTAT 35W (STAPLE) ×2 IMPLANT
SUT ETHILON 3 0 FSL (SUTURE) IMPLANT
SUT NURALON 4 0 TR CR/8 (SUTURE) ×4 IMPLANT
SUT VIC AB 2-0 CP2 18 (SUTURE) ×4 IMPLANT
SYR CONTROL 10ML LL (SYRINGE) ×2 IMPLANT
TAPE CLOTH SURG 4X10 WHT LF (GAUZE/BANDAGES/DRESSINGS) ×2 IMPLANT
TOWEL GREEN STERILE (TOWEL DISPOSABLE) ×2 IMPLANT
TOWEL GREEN STERILE FF (TOWEL DISPOSABLE) ×2 IMPLANT
TRAY FOLEY MTR SLVR 16FR STAT (SET/KITS/TRAYS/PACK) IMPLANT
WATER STERILE IRR 1000ML POUR (IV SOLUTION) ×2 IMPLANT

## 2021-01-28 NOTE — ED Triage Notes (Signed)
Pt here from [facility] ED with subdural. Pt c/o headache 6/10. States headache for several weeks. Pt with no unilateral weakness, facial droop. VSS with EMS.

## 2021-01-28 NOTE — Op Note (Signed)
01/28/2021  7:18 PM  PATIENT:  Derrick [name]  65 y.o. male  PRE-OPERATIVE DIAGNOSIS: Left subdural hematoma  POST-OPERATIVE DIAGNOSIS:  same  PROCEDURE: Left craniotomy for evacuation of subdural hematoma  SURGEON:  Sherley Bounds, MD  ASSISTANTS: Glenford Peers, [name]  ANESTHESIA:   General  EBL: Less than 100 ml  Total I/O In: 1200 [I.V.:1200] Out: -   BLOOD ADMINISTERED: none  DRAINS: Medium Hemovac subgaleal drain, ventriculostomy drain in the subdural space  SPECIMEN:  none  INDICATION FOR PROCEDURE: This patient presented with headaches and confusion and disorientation with generalized weakness. Imaging showed large left-sided subdural hematoma with mass-effect and shift.  Recommended left craniotomy for evacuation of the subdural hematoma. Patient understood the risks, benefits, and alternatives and potential outcomes and wished to proceed.  PROCEDURE DETAILS: The patient was taken to the operating room and after induction of adequate generalized endotracheal anesthesia, the head was affixed in a 3 point Mayfield head rest, and turned to the right to expose the left frontotemporal parietal region. The head was shaved and then cleaned and then prepped with DuraPrep and draped in the usual sterile fashion. 10 cc of local anesthetic was injected, and a curvilinear incision was made on the left of the head. Raney clips were placed to establish hemostasis of the scalp, the muscle was reflected with the scalp flap, to expose the left frontoparietal region. A burr hole was placed, and a craniotomy flap was turned utilizing the high-speed, electric drill. The flap was then placed in bacitracin-containing saline solution, and the dura was opened to expose a large underlying hematoma that was partially liquefied. A hematoma was then removed with a combination of irrigation and suction. I continued to irrigate until the irrigant was clear, and dried any bleeding with bipolar cautery. I then  placed a subdural drain through separate stab incision and close the dura with a running 4-0 Nurolon suture. Dural tack up sutures were placed. The dura was lined with Gelfoam, and the craniotomy flap was replaced with doggie-bone plates. The wound was copiously irrigated. A subgaleal drain was placed, and the galea was then closed with interrupted 2-0 Vicryl suture. The skin was then closed with staples a sterile dressing was applied. The patient was then taken out of the 3-point Mayfield headrest and awakened from general anesthesia, and transported to the recovery room in stable condition. At the end of the procedure all sponge, needle, and instrument counts were correct.    PLAN OF CARE: Admit to inpatient   PATIENT DISPOSITION:  PACU - hemodynamically stable.   Delay start of Pharmacological VTE agent (>24hrs) due to surgical blood loss or risk of bleeding:  yes

## 2021-01-28 NOTE — ED Triage Notes (Signed)
Pt brought over from MRI. Pt had a stat add on MRI from [place] due to HA and dizziness for a week. Pt brought over from MRI due to large amount of bleeding in brain. EDP notifed

## 2021-01-28 NOTE — Anesthesia Postprocedure Evaluation (Signed)
Anesthesia Post Note  Patient: Derrick [name]  Procedure(s) Performed: CRANIOTOMY HEMATOMA EVACUATION SUBDURAL (Left Head)     Patient location during evaluation: PACU Anesthesia Type: General Level of consciousness: awake and alert Pain management: pain level controlled Vital Signs Assessment: post-procedure vital signs reviewed and stable Respiratory status: spontaneous breathing, nonlabored ventilation and respiratory function stable Cardiovascular status: blood pressure returned to baseline and stable Postop Assessment: no apparent nausea or vomiting Anesthetic complications: no   No complications documented.  Last Vitals:  Vitals:   01/28/21 1945 01/28/21 2000  BP: 129/76 124/83  Pulse: 72 72  Resp: 15 16  Temp:    SpO2: 100% 98%    Last Pain:  Vitals:   01/28/21 2000  TempSrc:   PainSc: 0-No pain                 FITZGERALD,W. EDMOND

## 2021-01-28 NOTE — Anesthesia Procedure Notes (Signed)
Arterial Line Insertion Start/End2/14/2022 5:10 PM, 01/28/2021 5:15 PM Performed by: Inda Coke, CRNA, CRNA  Preanesthetic checklist: patient identified, IV checked, site marked, risks and benefits discussed, surgical consent, monitors and equipment checked, pre-op evaluation, timeout performed and anesthesia consent Lidocaine 1% used for infiltration Right, radial was placed Catheter size: 20 G Hand hygiene performed  and maximum sterile barriers used  Allen's test indicative of satisfactory collateral circulation Attempts: 1 Procedure performed without using ultrasound guided technique. Following insertion, dressing applied and Biopatch. Post procedure assessment: unchanged and normal  Patient tolerated the procedure well with no immediate complications.

## 2021-01-28 NOTE — Patient Instructions (Signed)
General Headache Without Cause A headache is pain or discomfort felt around the head or neck area. The specific cause of a headache may not be found. There are many causes and types of headaches. A few common ones are:  Tension headaches.  Migraine headaches.  Cluster headaches.  Chronic daily headaches. Follow these instructions at home: Watch your condition for any changes. Let your health care provider know about them. Take these steps to help with your condition: Managing pain  Take over-the-counter and prescription medicines only as told by your health care provider.  Lie down in a dark, quiet room when you have a headache.  If directed, put ice on your head and neck area: ? Put ice in a plastic bag. ? Place a towel between your skin and the bag. ? Leave the ice on for 20 minutes, 2-3 times per day.  If directed, apply heat to the affected area. Use the heat source that your health care provider recommends, such as a moist heat pack or a heating pad. ? Place a towel between your skin and the heat source. ? Leave the heat on for 20-30 minutes. ? Remove the heat if your skin turns bright red. This is especially important if you are unable to feel pain, heat, or cold. You may have a greater risk of getting burned.  Keep lights dim if bright lights bother you or make your headaches worse.      Eating and drinking  Eat meals on a regular schedule.  If you drink alcohol: ? Limit how much you use to:  0-1 drink a day for women.  0-2 drinks a day for men. ? Be aware of how much alcohol is in your drink. In the [place] one drink equals one 12 oz bottle of beer (355 mL), one 5 oz glass of wine (148 mL), or one 1 oz glass of hard liquor (44 mL).  Stop drinking caffeine, or decrease the amount of caffeine you drink. General instructions  Keep a headache journal to help find out what may trigger your headaches. For example, write down: ? What you eat and drink. ? How much sleep  you get. ? Any change to your diet or medicines.  Try massage or other relaxation techniques.  Limit stress.  Sit up straight, and do not tense your muscles.  Do not use any products that contain nicotine or tobacco, such as cigarettes, e-cigarettes, and chewing tobacco. If you need help quitting, ask your health care provider.  Exercise regularly as told by your health care provider.  Sleep on a regular schedule. Get 7-9 hours of sleep each night, or the amount recommended by your health care provider.  Keep all follow-up visits as told by your health care provider. This is important.   Contact a health care provider if:  Your symptoms are not helped by medicine.  You have a headache that is different from the usual headache.  You have nausea or you vomit.  You have a fever. Get help right away if:  Your headache becomes severe quickly.  Your headache gets worse after moderate to intense physical activity.  You have repeated vomiting.  You have a stiff neck.  You have a loss of vision.  You have problems with speech.  You have pain in the eye or ear.  You have muscular weakness or loss of muscle control.  You lose your balance or have trouble walking.  You feel faint or pass out.  You have  confusion.  You have a seizure. Summary  A headache is pain or discomfort felt around the head or neck area.  There are many causes and types of headaches. In some cases, the cause may not be found.  Keep a headache journal to help find out what may trigger your headaches. Watch your condition for any changes. Let your health care provider know about them.  Contact a health care provider if you have a headache that is different from the usual headache, or if your symptoms are not helped by medicine.  Get help right away if your headache becomes severe, you vomit, you have a loss of vision, you lose your balance, or you have a seizure. This information is not intended to  replace advice given to you by your health care provider. Make sure you discuss any questions you have with your health care provider. Document Revised: 06/21/2018 Document Reviewed: 06/21/2018 Elsevier Patient Education  [place].

## 2021-01-28 NOTE — ED Provider Notes (Addendum)
[facility] EMERGENCY DEPARTMENT Provider Note   CSN: [id number] Arrival date & time: 01/28/21  1420     History Chief Complaint  Patient presents with  . Dizziness  . Headache    Derrick [name] is a 65 y.o. male with past medical history significant for CAD, bipolar, hypertension, hyperglycemia who presents for evaluation of headache.  Patient started approximally 2 weeks ago.  Was seen by urgent care.  Was also having some diarrhea, congestion, rhinorrhea, sore throat, emesis.  Thought was due to viral infection.  DC home from UC.  His Covid test was negative at that time.  Patient states the rest of his symptoms have resolved however his headache has been worsening.  Was seen by PCP today.  Wife noted patient has been confused, multiple falls, dizzy, lightheaded, gait instability over last week.  Wife states she does not feel patient hit his head during his falls. Patient denies hitting head. Has also been having some difficulty getting his words out and stuttering.  He has bilateral upper extremity tremors at baseline however wife states these have worsen.  Also with multiple episode of NBNB emesis.  Rates his head pain a 8/10.  Worse to left side of head. Denies any visual changes, neck pain, neck stiffness however feels all over week.  Denies additional aggravating or alleviating factors. Last emesis just PTA.  History obtained from patient, wife in room, past medical records.  No interpreter used.  Last PO intake 1 PM- banana, water  Baby ASA daily, no additional anticoagulants   HPI     Past Medical History:  Diagnosis Date  . Anxiety   . Bipolar 1 disorder ([place])   . Bipolar disorder ([place]) 10/05/2009   Qualifier: Diagnosis of  By: [name]    . BPH (benign prostatic hypertrophy)   . CAD (coronary artery disease), native coronary artery    a. DES x 2 to RCA in 2004 b. 02/11/17 PCI w/DES x1 to mRCA  . Coronary atherosclerosis 10/05/2009   Qualifier: Diagnosis of  By:  [name]   ANGIOGRAPHIC DATA:  1. Left main coronary artery was free of critical disease.  2. The LAD coursed to the apex. There is mild luminal irregularity  including mild irregularity of the diagonal. No high grade areas of  stenosis were noted. There was faint collateralization of the distal  right coronary circulation.  3. The circumflex provided two major m  . Depression   . Dyspnea 09/20/2012  . Essential and other specified forms of tremor 02/05/2013  . HYPERCHOLESTEROLEMIA 10/05/2009   Qualifier: Diagnosis of  By: [name]    . Hyperlipidemia   . Memory loss 02/05/2013  . Mitral regurgitation and aortic stenosis   . MITRAL STENOSIS/ INSUFFICIENCY, NON-RHEUMATIC 01/06/2011   Qualifier: Diagnosis of  By: [name], MD, [name] Study Conclusions  - Left ventricle: The cavity size was normal. Wall thickness was increased in a pattern of mild LVH. Systolic function was normal. The estimated ejection fraction was in the range of 55% to 60%. Wall motion was normal; there were no regional wall motion abnormalities. Left ventricular diastolic function parameters were  . MVP (mitral valve prolapse)   . Myocardial infarction ([place])   . Obstructive sleep apnea 02/05/2013  . OSA (obstructive sleep apnea) 03/22/2013  . Parasomnia 02/05/2013  . Sleep apnea   . Sleep disorder with cognitive complaints 10/26/2018  . Unspecified hereditary and idiopathic peripheral neuropathy 02/05/2013    Patient Active Problem List  Diagnosis Date Noted  . Insomnia 11/29/2018  . Sleep disorder with cognitive complaints 10/26/2018  . OSA (obstructive sleep apnea) 03/22/2013  . Memory loss 02/05/2013  . Low back pain 02/05/2013  . Unspecified hereditary and idiopathic peripheral neuropathy 02/05/2013  . Essential and other specified forms of tremor 02/05/2013  . Parasomnia 02/05/2013  . Obstructive sleep apnea 02/05/2013  . Dyspnea 09/20/2012  . MITRAL STENOSIS/ INSUFFICIENCY, NON-RHEUMATIC  01/06/2011  . HYPERCHOLESTEROLEMIA 10/05/2009  . Bipolar disorder ([place]) 10/05/2009  . ANXIETY DISORDER 10/05/2009  . TOBACCO ABUSE 10/05/2009  . Coronary atherosclerosis 10/05/2009    Past Surgical History:  Procedure Laterality Date  . CORONARY STENT INTERVENTION N/A 02/12/2017   Procedure: Coronary Stent Intervention;  Surgeon: [name], MD;  Location: [place] [facility];  Service: Cardiovascular;  Laterality: N/A;  . LEFT HEART CATH AND CORONARY ANGIOGRAPHY N/A 02/12/2017   Procedure: Left Heart Cath and Coronary Angiography;  Surgeon: [name], MD;  Location: [place] [facility];  Service: Cardiovascular;  Laterality: N/A;       Family History  Problem Relation Age of Onset  . Heart disease Mother   . COPD Father   . Colon cancer Sister   . Heart disease Brother   . Esophageal cancer Neg Hx   . Rectal cancer Neg Hx   . Stomach cancer Neg Hx     Social History   Tobacco Use  . Smoking status: Current Some Day Smoker    Packs/day: 0.10    Types: Cigarettes    Last attempt to quit: 02/05/2003    Years since quitting: 17.9  . Smokeless tobacco: Never Used  . Tobacco comment: 3 cigs per day  Vaping Use  . Vaping Use: Never used  Substance Use Topics  . Alcohol use: Not Currently    Comment:    . Drug use: No    Home Medications Prior to Admission medications   Medication Sig Start Date End Date Taking? Authorizing Provider  acetaminophen (TYLENOL) 500 MG tablet Take 500 mg by mouth every 6 (six) hours as needed for mild pain.    [provider]  aspirin 81 MG tablet Take 81 mg by mouth daily.    [provider]  atorvastatin (LIPITOR) 80 MG tablet TAKE 1 TABLET EVERY DAY 10/15/20   Burtis Junes, NP  divalproex (DEPAKOTE) 500 MG DR tablet Take 4 tablets (2,000 mg total) by mouth at bedtime. 08/15/20   Donnal Moat T, [name]  escitalopram (LEXAPRO) 20 MG tablet TAKE 1 TABLET EVERY DAY 08/15/20   Donnal Moat T, [name]  ezetimibe (ZETIA) 10 MG  tablet Take 1 tablet (10 mg total) by mouth daily. 07/12/20 10/10/20  Burtis Junes, NP  isosorbide mononitrate (IMDUR) 30 MG 24 hr tablet Take 0.5 tablets (15 mg total) by mouth daily. 09/18/20   Burtis Junes, NP  lamoTRIgine (LAMICTAL) 100 MG tablet TAKE 1 AND 1/2 TABLETS EVERY DAY 01/14/21   Donnal Moat T, [name]  metoprolol tartrate (LOPRESSOR) 25 MG tablet Take 1.5 tab PO BID 10/19/20   [name], [name]  montelukast (SINGULAIR) 10 MG tablet Take 1 tablet (10 mg total) by mouth daily. 10/19/20   [name], [name]  nitroGLYCERIN (NITROSTAT) 0.4 MG SL tablet Place 1 tablet (0.4 mg total) under the tongue every 5 (five) minutes as needed for chest pain. 07/11/20   Burtis Junes, NP  pantoprazole (PROTONIX) 40 MG tablet TAKE 1 TABLET EVERY DAY 10/25/20   Servando Snare,  Marlane Hatcher, NP  rizatriptan (MAXALT) 10 MG tablet Take 1 tablet (10 mg total) by mouth as needed for migraine. May repeat in 2 hours if needed 01/21/21   [name], [name]  tamsulosin (FLOMAX) 0.4 MG CAPS capsule Take 1 capsule (0.4 mg total) by mouth daily. 10/02/20   [name], [name]    Allergies    Patient has no known allergies.  Review of Systems   Review of Systems  Constitutional: Negative.   HENT: Negative.   Respiratory: Negative.   Cardiovascular: Negative.   Gastrointestinal: Positive for nausea and vomiting.  Genitourinary: Negative.   Musculoskeletal: Negative for neck pain.  Skin: Negative.   Neurological: Positive for dizziness, speech difficulty, weakness and headaches.  All other systems reviewed and are negative.   Physical Exam Updated Vital Signs BP 132/80   Pulse 84   Resp 17   Ht 5\' 11"  (1.803 m)   Wt 77 kg   SpO2 99%   BMI 23.68 kg/m   Physical Exam   Physical Exam  Constitutional: Pt is oriented to person, place, and time. Pt appears well-developed and well-nourished. No distress.  HENT:  Head: Normocephalic and atraumatic.  Mouth/Throat: Oropharynx is clear and moist.   Eyes: Conjunctivae and EOM are normal. Pupils are equal, round, and reactive to light. No scleral icterus.  Mild horizontal nystagmus, however difficulty 2/2 patient intermittently closing eyes during exam. Neck: Normal range of motion. Neck supple.  Full active and passive ROM without pain No midline or paraspinal tenderness No nuchal rigidity or meningeal signs  Cardiovascular: Normal rate, regular rhythm and intact distal pulses.   Pulmonary/Chest: Effort normal and breath sounds normal. No respiratory distress. Pt has no wheezes. No rales.  Abdominal: Soft. Bowel sounds are normal. There is no tenderness. There is no rebound and no guarding.  Musculoskeletal: Normal range of motion.  Lymphadenopathy:    No cervical adenopathy.  Neurological: Pt. is alert and oriented to person, place, and time. He has normal reflexes. No cranial nerve deficit.  Exhibits normal muscle tone. Coordination normal.  Mental Status:  Alert. States year is 2021. Alert to place, person, wife in room. Difficulty with word findings and intermittent stuttering.  Able to follow 2 step commands without difficulty.  Cranial Nerves:  II:  Peripheral visual fields grossly normal, pupils equal, round, reactive to light III,IV, VI: ptosis not present, extra-ocular motions intact bilaterally  V,VII: smile symmetric, facial light touch sensation equal VIII: hearing grossly normal bilaterally  IX,X: midline uvula rise  XI: bilateral shoulder shrug equal and strong XII: midline tongue extension  Motor:  4.5/5 in upper and lower extremities bilaterally including strong and equal grip strength and dorsiflexion/plantar flexion Sensory: Pinprick and light touch normal in all extremities.  Deep Tendon Reflexes: 2+ and symmetric  Cerebellar: Abnormal finger to nose, Moderate bilateral tremors Gait: Ataxic gait in room CV: distal pulses palpable throughout   Skin: Skin is warm and dry. No rash noted. Pt is not diaphoretic.   Psychiatric: Pt has a normal mood and affect. Behavior is normal. Judgment and thought content normal.  Nursing note and vitals reviewed.   .ED Results / Procedures / Treatments   Labs (all labs ordered are listed, but only abnormal results are displayed) Labs Reviewed  CBG MONITORING, ED - Abnormal; Notable for the following components:      Result Value   Glucose-Capillary 178 (*)    All other components within normal limits  RESP PANEL BY RT-PCR (FLU  A&B, COVID) ARPGX2  CBC WITH DIFFERENTIAL/PLATELET  PROTIME-INR  COMPREHENSIVE METABOLIC PANEL    EKG None  Radiology MR ANGIO HEAD WO CONTRAST  Result Date: 01/28/2021 CLINICAL DATA:  Balance problems. Dizziness. Mental status change, unknown cause. Worsening headache. Confusion. Nystagmus. EXAM: MRI HEAD WITHOUT CONTRAST MRA HEAD WITHOUT CONTRAST TECHNIQUE: Multiplanar, multiecho pulse sequences of the brain and surrounding structures were obtained without intravenous contrast. Angiographic images of the head were obtained using MRA technique without contrast. COMPARISON:  None. FINDINGS: MRI HEAD FINDINGS Brain: Large left cerebral convexity subdural hematoma extending into the interhemispheric fissure posteriorly, with increased signal on T1 and T2, consistent with subacute bleed. The hematoma measures up to 2.6 cm in thickness with prominent mass effect on the left cerebral hemisphere and midbrain with effacement of the cerebral sulci, near effacement of the left lateral and third ventricles common narrowing of the left ambient cistern and a 1.4 cm rightward midline shift with medialization of the left uncus. The right lateral ventricle is mildly dilated. No evidence of an acute infarct or mass lesion. Vascular: Normal flow voids. Skull and upper cervical spine: Normal marrow signal. Sinuses/Orbits: Negative. MRA HEAD FINDINGS The visualized portions of the distal cervical and intracranial internal carotid arteries are widely patent  with normal flow related enhancement. Medialization of the bilateral ACA and left MCA vascular tree due to mass effect from left convexity subdural hematoma. The bilateral anterior cerebral arteries and middle cerebral arteries are widely patent with antegrade flow without high-grade flow-limiting stenosis or proximal branch occlusion. No intracranial aneurysm within the anterior circulation. The vertebral arteries are widely patent with antegrade flow. The posterior inferior cerebral arteries are normal. Vertebrobasilar junction and basilar artery are widely patent with antegrade flow without evidence of basilar stenosis or aneurysm. Medialization of the left PCA P2 segment due to mass effect from left convexity subdural hematoma. Posterior cerebral arteries are patent. No intracranial aneurysm within the posterior circulation. IMPRESSION: 1. Large left cerebral convexity subdural hematoma extending into the interhemispheric fissure posteriorly, measuring up to 2.6 cm in thickness with prominent mass effect on the left cerebral hemisphere and midbrain, resulting in 1.4 cm rightward midline shift with medialization of the left uncus. 2. Mild dilation of the right lateral ventricle. 3. No large vessel occlusion or intracranial aneurysm identified. These results were called by telephone at the time of interpretation on 01/28/2021 at 2:56 pm to provider Dr. Melina Copa, who verbally acknowledged these results. Electronically Signed   By: Pedro Earls M.D.   On: 01/28/2021 14:59   MR Brain Wo Contrast  Result Date: 01/28/2021 CLINICAL DATA:  Balance problems. Dizziness. Mental status change, unknown cause. Worsening headache. Confusion. Nystagmus. EXAM: MRI HEAD WITHOUT CONTRAST MRA HEAD WITHOUT CONTRAST TECHNIQUE: Multiplanar, multiecho pulse sequences of the brain and surrounding structures were obtained without intravenous contrast. Angiographic images of the head were obtained using MRA technique  without contrast. COMPARISON:  None. FINDINGS: MRI HEAD FINDINGS Brain: Large left cerebral convexity subdural hematoma extending into the interhemispheric fissure posteriorly, with increased signal on T1 and T2, consistent with subacute bleed. The hematoma measures up to 2.6 cm in thickness with prominent mass effect on the left cerebral hemisphere and midbrain with effacement of the cerebral sulci, near effacement of the left lateral and third ventricles common narrowing of the left ambient cistern and a 1.4 cm rightward midline shift with medialization of the left uncus. The right lateral ventricle is mildly dilated. No evidence of an acute infarct or mass lesion.  Vascular: Normal flow voids. Skull and upper cervical spine: Normal marrow signal. Sinuses/Orbits: Negative. MRA HEAD FINDINGS The visualized portions of the distal cervical and intracranial internal carotid arteries are widely patent with normal flow related enhancement. Medialization of the bilateral ACA and left MCA vascular tree due to mass effect from left convexity subdural hematoma. The bilateral anterior cerebral arteries and middle cerebral arteries are widely patent with antegrade flow without high-grade flow-limiting stenosis or proximal branch occlusion. No intracranial aneurysm within the anterior circulation. The vertebral arteries are widely patent with antegrade flow. The posterior inferior cerebral arteries are normal. Vertebrobasilar junction and basilar artery are widely patent with antegrade flow without evidence of basilar stenosis or aneurysm. Medialization of the left PCA P2 segment due to mass effect from left convexity subdural hematoma. Posterior cerebral arteries are patent. No intracranial aneurysm within the posterior circulation. IMPRESSION: 1. Large left cerebral convexity subdural hematoma extending into the interhemispheric fissure posteriorly, measuring up to 2.6 cm in thickness with prominent mass effect on the left  cerebral hemisphere and midbrain, resulting in 1.4 cm rightward midline shift with medialization of the left uncus. 2. Mild dilation of the right lateral ventricle. 3. No large vessel occlusion or intracranial aneurysm identified. These results were called by telephone at the time of interpretation on 01/28/2021 at 2:56 pm to provider Dr. Melina Copa, who verbally acknowledged these results. Electronically Signed   By: Pedro Earls M.D.   On: 01/28/2021 14:59    Procedures .Critical Care Performed by: Nettie Elm, [name] Authorized by: Nettie Elm, [name]   Critical care provider statement:    Critical care time (minutes):  45   Critical care was necessary to treat or prevent imminent or life-threatening deterioration of the following conditions:  CNS failure or compromise   Critical care was time spent personally by me on the following activities:  Discussions with consultants, evaluation of patient's response to treatment, examination of patient, ordering and performing treatments and interventions, ordering and review of laboratory studies, ordering and review of radiographic studies, pulse oximetry, re-evaluation of patient's condition, obtaining history from patient or surrogate and review of old charts     Medications Ordered in ED Medications - No data to display  ED Course  I have reviewed the triage vital signs and the nursing notes.  Pertinent labs & imaging results that were available during my care of the patient were reviewed by me and considered in my medical decision making (see chart for details).  Patient here for evaluation of headache.  Had outpatient MRI and sent here for abnormal findings.  On arrival patient is appear to be slightly confused as he states the year is 2021.  Moderate bilateral hand tremors with abnormal finger-to-nose. Nystagmus present.  MR shows large left cerebral convexity subdural hematoma extending into the interhemispheric  fissure posteriorly, measuring up to 2.6 cm in thickness with prominent mass effect on the left cerebral hemisphere and midbrain, resulting in 1.4 cm rightward midline shift with medialization of the left uncus.  Has a patent airway. Heart and lungs clear. Abd soft.  Labs and imaging personally reviewed and interpreted  CONSULT with [place], NP with [facility], recommends ED to ED transfer.  Secretary has page EMS for expedited transfer to [facility].   Repage [facility] when patient in [facility] ED.  Attending, Dr. Melina Copa does not feel patient needs to be intubated at this time.  Dr. Eulis Foster, EDP at [facility] accepting in transfer  Discussed findings with patient,  family in room.  They are agreeable for transfer.  Patient critically ill, transferred with EMS for specialty services for likely surgical intervention.  Clinical Course as of 01/28/21 1537  Mon Jan 28, 6279  1375 65 year old male was sent for an outpatient MRI by his PCP due to headache and dizziness for a week or 2.  Abnormal MRI patient brought over to the emergency department.  He is awake and alert.  He has an abnormal neurologic exam.  CT showing what looks like a large subdural with shift.  Will review with neurosurgery. [MB]    Clinical Course User Index [MB] Hayden Rasmussen, MD   MDM Rules/Calculators/A&P                           Final Clinical Impression(s) / ED Diagnoses Final diagnoses:  Subdural hematoma [facility])    Rx / DC Orders ED Discharge Orders    None       Kristjan Derner A, [name] 01/28/21 1537    Daichi Moris A, [name] 01/28/21 1618    Hayden Rasmussen, MD 01/28/21 1807

## 2021-01-28 NOTE — ED Provider Notes (Signed)
Pt seen in transfer from [facility] ED - has had SDH after 2 weeks of headache and nausea, after going to outside hospital, giving medications, going to primary care physician and given medications, went back today and was referred for MRI.  MRI performed at outside hospital, found to have subdural hematoma.- seems off balance when he walks - denies head injury - on ASA but no other anticoagulants.  Has exam which is rather unremarkable in supine position - NS has been paged to come see pt.    Labs including coags normal.  Dr. Ronnald Ramp paged to be made aware of pt arrival.  I discussed the care with the advance practice provider on the team, they will take the patient to the operating room, Covid sample has been sent.   Noemi Chapel, MD 01/28/21 [phone]

## 2021-01-28 NOTE — Progress Notes (Signed)
Established Patient Office Visit  Subjective:  Patient ID: Derrick [name], male    DOB: May 17, 1956  Age: 65 y.o. MRN: [id number]  CC:  Chief Complaint  Patient presents with  . Migraine    X 10 days  . Dizziness  . Anorexia    HPI Derrick [name] presents for headaches. He has had a daily headache for at least 2 weeks. The headache comes and goes. It is worse behind his ears. It feels like a pressure, throbbing, sore pain. Sleep makes it better. Light seems to make it worse. He denies nausea or vomiting. He has been taking rizatriptan daily with some relief. He also reports some changes in his gait, and confusion for about a week. The confusion seems to come and go. He has difficulty remembering what he has done and if he has eaten or not.  He has fallen about 4 times in the last two weeks and he feels like he has had difficulty with his balance. He also reports dizziness and decreased appetite. He denies hallucinations. He denies changes in vision, focal weakness, chest pain, or shortness of breath. Denies congestion or fever. He denies LOC or head injury.   Past Medical History:  Diagnosis Date  . Anxiety   . Bipolar 1 disorder ([name])   . Bipolar disorder ([name]) 10/05/2009   Qualifier: Diagnosis of  By: Vikki Ports    . BPH (benign prostatic hypertrophy)   . CAD (coronary artery disease), native coronary artery    a. DES x 2 to RCA in 2004 b. 02/11/17 PCI w/DES x1 to mRCA  . Coronary atherosclerosis 10/05/2009   Qualifier: Diagnosis of  By: Vikki Ports   ANGIOGRAPHIC DATA:  1. Left main coronary artery was free of critical disease.  2. The LAD coursed to the apex. There is mild luminal irregularity  including mild irregularity of the diagonal. No high grade areas of  stenosis were noted. There was faint collateralization of the distal  right coronary circulation.  3. The circumflex provided two major m  . Depression   . Dyspnea 09/20/2012  . Essential and other specified forms of  tremor 02/05/2013  . HYPERCHOLESTEROLEMIA 10/05/2009   Qualifier: Diagnosis of  By: Vikki Ports    . Hyperlipidemia   . Memory loss 02/05/2013  . Mitral regurgitation and aortic stenosis   . MITRAL STENOSIS/ INSUFFICIENCY, NON-RHEUMATIC 01/06/2011   Qualifier: Diagnosis of  By: Riley Kill, MD, Johny Sax Study Conclusions  - Left ventricle: The cavity size was normal. Wall thickness was increased in a pattern of mild LVH. Systolic function was normal. The estimated ejection fraction was in the range of 55% to 60%. Wall motion was normal; there were no regional wall motion abnormalities. Left ventricular diastolic function parameters were  . MVP (mitral valve prolapse)   . Myocardial infarction ([name])   . Obstructive sleep apnea 02/05/2013  . OSA (obstructive sleep apnea) 03/22/2013  . Parasomnia 02/05/2013  . Sleep apnea   . Sleep disorder with cognitive complaints 10/26/2018  . Unspecified hereditary and idiopathic peripheral neuropathy 02/05/2013    Past Surgical History:  Procedure Laterality Date  . CORONARY STENT INTERVENTION N/A 02/12/2017   Procedure: Coronary Stent Intervention;  Surgeon: Peter M Swaziland, MD;  Location: [facility] INVASIVE CV LAB;  Service: Cardiovascular;  Laterality: N/A;  . LEFT HEART CATH AND CORONARY ANGIOGRAPHY N/A 02/12/2017   Procedure: Left Heart Cath and Coronary Angiography;  Surgeon: Laurey Morale, MD;  Location: [facility] INVASIVE CV LAB;  Service: Cardiovascular;  Laterality: N/A;    Family History  Problem Relation Age of Onset  . Heart disease Mother   . COPD Father   . Colon cancer Sister   . Heart disease Brother   . Esophageal cancer Neg Hx   . Rectal cancer Neg Hx   . Stomach cancer Neg Hx     Social History   Socioeconomic History  . Marital status: Married    Spouse name: Not on file  . Number of children: Not on file  . Years of education: Not on file  . Highest education level: Not on file  Occupational History  . Not on file  Tobacco Use   . Smoking status: Current Some Day Smoker    Packs/day: 0.10    Types: Cigarettes    Last attempt to quit: 02/05/2003    Years since quitting: 17.9  . Smokeless tobacco: Never Used  . Tobacco comment: 3 cigs per day  Vaping Use  . Vaping Use: Never used  Substance and Sexual Activity  . Alcohol use: Not Currently    Comment:    . Drug use: No  . Sexual activity: Not on file  Other Topics Concern  . Not on file  Social History Narrative  . Not on file   Social Determinants of Health   Financial Resource Strain: Not on file  Food Insecurity: Not on file  Transportation Needs: Not on file  Physical Activity: Not on file  Stress: Not on file  Social Connections: Not on file  Intimate Partner Violence: Not on file    Outpatient Medications Prior to Visit  Medication Sig Dispense Refill  . acetaminophen (TYLENOL) 500 MG tablet Take 500 mg by mouth every 6 (six) hours as needed for mild pain.    [name] aspirin 81 MG tablet Take 81 mg by mouth daily.    [name] atorvastatin (LIPITOR) 80 MG tablet TAKE 1 TABLET EVERY DAY 90 tablet 3  . divalproex (DEPAKOTE) 500 MG DR tablet Take 4 tablets (2,000 mg total) by mouth at bedtime. 360 tablet 1  . escitalopram (LEXAPRO) 20 MG tablet TAKE 1 TABLET EVERY DAY 90 tablet 1  . isosorbide mononitrate (IMDUR) 30 MG 24 hr tablet Take 0.5 tablets (15 mg total) by mouth daily. 45 tablet 2  . lamoTRIgine (LAMICTAL) 100 MG tablet TAKE 1 AND 1/2 TABLETS EVERY DAY 135 tablet 0  . metoprolol tartrate (LOPRESSOR) 25 MG tablet Take 1.5 tab PO BID 270 tablet 2  . montelukast (SINGULAIR) 10 MG tablet Take 1 tablet (10 mg total) by mouth daily. 90 tablet 3  . nitroGLYCERIN (NITROSTAT) 0.4 MG SL tablet Place 1 tablet (0.4 mg total) under the tongue every 5 (five) minutes as needed for chest pain. 25 tablet 3  . pantoprazole (PROTONIX) 40 MG tablet TAKE 1 TABLET EVERY DAY 90 tablet 2  . rizatriptan (MAXALT) 10 MG tablet Take 1 tablet (10 mg total) by mouth as needed for  migraine. May repeat in 2 hours if needed 10 tablet 0  . tamsulosin (FLOMAX) 0.4 MG CAPS capsule Take 1 capsule (0.4 mg total) by mouth daily. 90 capsule 3  . ezetimibe (ZETIA) 10 MG tablet Take 1 tablet (10 mg total) by mouth daily. 90 tablet 3   Facility-Administered Medications Prior to Visit  Medication Dose Route Frequency Provider Last Rate Last Admin  . 0.9 %  sodium chloride infusion  500 mL Intravenous Once Lynann Bologna, MD        No  Known Allergies  ROS Review of Systems As pet HPI.     Objective:    Physical Exam Vitals and nursing note reviewed.  Constitutional:      General: He is not in acute distress.    Appearance: Normal appearance. He is not ill-appearing, toxic-appearing or diaphoretic.  HENT:     Head: Normocephalic and atraumatic.     Right Ear: Tympanic membrane, ear canal and external ear normal.     Left Ear: Tympanic membrane, ear canal and external ear normal.  Eyes:     Extraocular Movements:     Right eye: Nystagmus present.     Left eye: Nystagmus present.     Conjunctiva/sclera: Conjunctivae normal.     Pupils: Pupils are equal, round, and reactive to light.  Neck:     Vascular: No carotid bruit.  Cardiovascular:     Rate and Rhythm: Normal rate and regular rhythm.     Heart sounds: Normal heart sounds. No murmur heard.   Pulmonary:     Effort: Pulmonary effort is normal. No respiratory distress.     Breath sounds: Normal breath sounds.  Musculoskeletal:     Cervical back: Neck supple. No rigidity or tenderness.     Right lower leg: No edema.     Left lower leg: No edema.  Lymphadenopathy:     Cervical: No cervical adenopathy.  Skin:    General: Skin is warm and dry.     Capillary Refill: Capillary refill takes less than 2 seconds.  Neurological:     General: No focal deficit present.     Mental Status: He is alert and oriented to person, place, and time.     Cranial Nerves: No cranial nerve deficit.     Sensory: No sensory  deficit.     Motor: Weakness (generalized) and tremor present.     Coordination: Coordination normal. Finger-Nose-Finger Test and Heel to [place] Test normal.     Gait: Gait is intact.  Psychiatric:        Mood and Affect: Mood normal.        Behavior: Behavior normal.     BP 122/67   Pulse (!) 106   Temp (!) 96.7 F (35.9 C) (Temporal)   Ht 5\' 11"  (1.803 m)   Wt 171 lb 6.4 oz (77.7 kg)   SpO2 95%   BMI 23.91 kg/m  Wt Readings from Last 3 Encounters:  01/28/21 171 lb 6.4 oz (77.7 kg)  10/02/20 184 lb (83.5 kg)  07/11/20 181 lb 6.4 oz (82.3 kg)     Health Maintenance Due  Topic Date Due  . COVID-19 Vaccine (2 - Booster for Janssen series) 06/07/2020    There are no preventive care reminders to display for this patient.  Lab Results  Component Value Date   TSH 2.350 10/02/2020   Lab Results  Component Value Date   WBC 7.9 07/11/2020   HGB 13.9 07/11/2020   HCT 40.2 07/11/2020   MCV 94 07/11/2020   PLT 213 07/11/2020   Lab Results  Component Value Date   NA 141 07/11/2020   K 4.8 07/11/2020   CO2 24 07/11/2020   GLUCOSE 98 07/11/2020   BUN 13 07/11/2020   CREATININE 0.94 07/11/2020   BILITOT 0.3 08/22/2020   ALKPHOS 53 08/22/2020   AST 17 08/22/2020   ALT 16 08/22/2020   PROT 6.9 08/22/2020   ALBUMIN 4.5 08/22/2020   CALCIUM 9.5 07/11/2020   GFR 90.20 10/18/2014   Lab Results  Component Value Date   CHOL 103 08/22/2020   Lab Results  Component Value Date   HDL 39 (L) 08/22/2020   Lab Results  Component Value Date   LDLCALC 48 08/22/2020   Lab Results  Component Value Date   TRIG 75 08/22/2020   Lab Results  Component Value Date   CHOLHDL 2.6 08/22/2020   No results found for: HGBA1C    Assessment & Plan:   Kealan was seen today for migraine, dizziness and anorexia.  Diagnoses and all orders for this visit:  Worsening headaches With dizziness, confusion, and changes in balance. Order placed for Stat MRI. Discussed referral to  neurology if imaging is normal. -     MR Brain Wo Contrast; Future  Balance problems MRI ordered. Labs pending as below.  -     MR Brain Wo Contrast; Future -     CBC with Differential/Platelet -     CMP14+EGFR -     Thyroid Panel With TSH  Confusion MRI ordered. Labs pending as below.  -     MR Brain Wo Contrast; Future -     CBC with Differential/Platelet -     CMP14+EGFR -     Thyroid Panel With TSH  Nystagmus MRI ordered.  -     MR Brain Wo Contrast; Future   Follow-up: Patient to go to [facility] after leaving the office for stat MRI.  Return if symptoms worsen or fail to improve.   The patient indicates understanding of these issues and agrees with the plan.  Gabriel Earing, [name]

## 2021-01-28 NOTE — Anesthesia Preprocedure Evaluation (Signed)
Anesthesia Evaluation  Patient identified by MRN, date of birth, ID band Patient awake    Reviewed: Allergy & Precautions, H&P , NPO status , Patient's Chart, lab work & pertinent test results  Airway Mallampati: II   Neck ROM: full    Dental   Pulmonary sleep apnea , Current Smoker,    breath sounds clear to auscultation       Cardiovascular + CAD, + Past MI and + Cardiac Stents  + Valvular Problems/Murmurs  Rhythm:regular Rate:Normal  - Mitral Stenosis   Neuro/Psych PSYCHIATRIC DISORDERS Anxiety Depression Bipolar Disorder Subdural hematoma  Neuromuscular disease    GI/Hepatic negative GI ROS, Neg liver ROS,   Endo/Other  negative endocrine ROS  Renal/GU negative Renal ROS     Musculoskeletal negative musculoskeletal ROS (+)   Abdominal   Peds  Hematology negative hematology ROS (+)   Anesthesia Other Findings - HLD  Reproductive/Obstetrics                            Anesthesia Physical Anesthesia Plan  ASA: III and emergent  Anesthesia Plan: General   Post-op Pain Management:    Induction: Intravenous  PONV Risk Score and Plan: 2 and Ondansetron and Dexamethasone  Airway Management Planned: Oral ETT  Additional Equipment: Arterial line  Intra-op Plan:   Post-operative Plan: Extubation in OR  Informed Consent: I have reviewed the patients History and Physical, chart, labs and discussed the procedure including the risks, benefits and alternatives for the proposed anesthesia with the patient or authorized representative who has indicated his/her understanding and acceptance.     Dental advisory given  Plan Discussed with: CRNA, Anesthesiologist and Surgeon  Anesthesia Plan Comments:        Anesthesia Quick Evaluation

## 2021-01-28 NOTE — ED Notes (Signed)
Pt reports falling a couple of times and difficulty getting words out

## 2021-01-28 NOTE — H&P (Signed)
Subjective: Patient is a 65 y.o. male admitted for L SDH. Onset of symptoms was 2 weeks ago, rapidly worsening since that time.  The pain is rated moderate, and is located at the L hemicranium. The pain is described as aching and occurs all day. The symptoms have been progressive. Symptoms are exacerbated by nothing in particular. MRI or CT showed large L SDH with shift.  He denies any trauma.  He takes a baby aspirin but no other anticoagulants.  has a history of coronary artery stenting.  Denies nausea vomiting.  Some disorientation.  Past Medical History:  Diagnosis Date  . Anxiety   . Bipolar 1 disorder ([place])   . Bipolar disorder ([place]) 10/05/2009   Qualifier: Diagnosis of  By: [name]    . BPH (benign prostatic hypertrophy)   . CAD (coronary artery disease), native coronary artery    a. DES x 2 to RCA in 2004 b. 02/11/17 PCI w/DES x1 to mRCA  . Coronary atherosclerosis 10/05/2009   Qualifier: Diagnosis of  By: [name]   ANGIOGRAPHIC DATA:  1. Left main coronary artery was free of critical disease.  2. The LAD coursed to the apex. There is mild luminal irregularity  including mild irregularity of the diagonal. No high grade areas of  stenosis were noted. There was faint collateralization of the distal  right coronary circulation.  3. The circumflex provided two major m  . Depression   . Dyspnea 09/20/2012  . Essential and other specified forms of tremor 02/05/2013  . HYPERCHOLESTEROLEMIA 10/05/2009   Qualifier: Diagnosis of  By: [name]    . Hyperlipidemia   . Memory loss 02/05/2013  . Mitral regurgitation and aortic stenosis   . MITRAL STENOSIS/ INSUFFICIENCY, NON-RHEUMATIC 01/06/2011   Qualifier: Diagnosis of  By: [name], MD, [name] Study Conclusions  - Left ventricle: The cavity size was normal. Wall thickness was increased in a pattern of mild LVH. Systolic function was normal. The estimated ejection fraction was in the range of 55% to 60%. Wall motion was  normal; there were no regional wall motion abnormalities. Left ventricular diastolic function parameters were  . MVP (mitral valve prolapse)   . Myocardial infarction ([place])   . Obstructive sleep apnea 02/05/2013  . OSA (obstructive sleep apnea) 03/22/2013  . Parasomnia 02/05/2013  . Sleep apnea   . Sleep disorder with cognitive complaints 10/26/2018  . Unspecified hereditary and idiopathic peripheral neuropathy 02/05/2013    Past Surgical History:  Procedure Laterality Date  . CORONARY STENT INTERVENTION N/A 02/12/2017   Procedure: Coronary Stent Intervention;  Surgeon: [name], MD;  Location: [place] CV LAB;  Service: Cardiovascular;  Laterality: N/A;  . LEFT HEART CATH AND CORONARY ANGIOGRAPHY N/A 02/12/2017   Procedure: Left Heart Cath and Coronary Angiography;  Surgeon: [name], MD;  Location: [place] CV LAB;  Service: Cardiovascular;  Laterality: N/A;    Prior to Admission medications   Medication Sig Start Date End Date Taking? Authorizing Provider  acetaminophen (TYLENOL) 500 MG tablet Take 500 mg by mouth every 6 (six) hours as needed for mild pain.    [provider]  aspirin 81 MG tablet Take 81 mg by mouth daily.    [provider]  atorvastatin (LIPITOR) 80 MG tablet TAKE 1 TABLET EVERY DAY 10/15/20   Burtis Junes, NP  divalproex (DEPAKOTE) 500 MG DR tablet Take 4 tablets (2,000 mg total) by mouth at bedtime. 08/15/20   Addison Lank, [name]  escitalopram (LEXAPRO) 20 MG tablet TAKE 1 TABLET EVERY DAY 08/15/20   Donnal Moat T, [name]  ezetimibe (ZETIA) 10 MG tablet Take 1 tablet (10 mg total) by mouth daily. 07/12/20 10/10/20  Burtis Junes, NP  isosorbide mononitrate (IMDUR) 30 MG 24 hr tablet Take 0.5 tablets (15 mg total) by mouth daily. 09/18/20   Burtis Junes, NP  lamoTRIgine (LAMICTAL) 100 MG tablet TAKE 1 AND 1/2 TABLETS EVERY DAY 01/14/21   Donnal Moat T, [name]  metoprolol tartrate (LOPRESSOR) 25 MG tablet Take 1.5 tab PO BID 10/19/20    [name], [name]  montelukast (SINGULAIR) 10 MG tablet Take 1 tablet (10 mg total) by mouth daily. 10/19/20   [name], [name]  nitroGLYCERIN (NITROSTAT) 0.4 MG SL tablet Place 1 tablet (0.4 mg total) under the tongue every 5 (five) minutes as needed for chest pain. 07/11/20   Burtis Junes, NP  pantoprazole (PROTONIX) 40 MG tablet TAKE 1 TABLET EVERY DAY 10/25/20   Burtis Junes, NP  rizatriptan (MAXALT) 10 MG tablet Take 1 tablet (10 mg total) by mouth as needed for migraine. May repeat in 2 hours if needed 01/21/21   [name], [name]  tamsulosin (FLOMAX) 0.4 MG CAPS capsule Take 1 capsule (0.4 mg total) by mouth daily. 10/02/20   [name], [name]   No Known Allergies  Social History   Tobacco Use  . Smoking status: Current Some Day Smoker    Packs/day: 0.10    Types: Cigarettes    Last attempt to quit: 02/05/2003    Years since quitting: 17.9  . Smokeless tobacco: Never Used  . Tobacco comment: 3 cigs per day  Substance Use Topics  . Alcohol use: Not Currently    Comment:      Family History  Problem Relation Age of Onset  . Heart disease Mother   . COPD Father   . Colon cancer Sister   . Heart disease Brother   . Esophageal cancer Neg Hx   . Rectal cancer Neg Hx   . Stomach cancer Neg Hx      Review of Systems  Positive ROS: As above  All other systems have been reviewed and were otherwise negative with the exception of those mentioned in the HPI and as above.  Objective: Vital signs in last 24 hours: Temp:  [96.7 F (35.9 C)-98.5 F (36.9 C)] 98.5 F (36.9 C) (02/14 1703) Pulse Rate:  [60-106] 62 (02/14 1700) Resp:  [14-21] 21 (02/14 1700) BP: (121-137)/(67-83) 134/77 (02/14 1700) SpO2:  [95 %-100 %] 98 % (02/14 1700) Weight:  [77 kg-77.7 kg] 77 kg (02/14 1431)  General Appearance: Alert, cooperative, no distress, appears stated age Head: Normocephalic, without obvious abnormality, atraumatic Eyes: PERRL, conjunctiva/corneas clear,  EOM's intact    Neck: Supple, symmetrical, trachea midline Back: Symmetric, no curvature, ROM normal, no CVA tenderness Lungs:  respirations unlabored Heart: Regular rate and rhythm Abdomen: Soft, non-tender Extremities: Extremities normal, atraumatic, no cyanosis or edema Pulses: 2+ and symmetric all extremities Skin: Skin color, texture, turgor normal, no rashes or lesions  NEUROLOGIC:   Mental status: Alert and oriented x4,  no aphasia, good attention span, fund of knowledge, and memory appear appropriate Motor Exam - grossly normal, no drift Sensory Exam - grossly normal Reflexes: 1+ Coordination - grossly normal Gait -not tested Balance -not tested Cranial Nerves: I: smell Not tested  II: visual acuity  OS: nl    OD: nl  II: visual fields  Full to confrontation  II: pupils Equal, round, reactive to light  III,VII: ptosis None  III,IV,VI: extraocular muscles  Full ROM  V: mastication Normal  V: facial light touch sensation  Normal  V,VII: corneal reflex  Present  VII: facial muscle function - upper  Normal  VII: facial muscle function - lower Normal  VIII: hearing Not tested  IX: soft palate elevation  Normal  IX,X: gag reflex Present  XI: trapezius strength  5/5  XI: sternocleidomastoid strength 5/5  XI: neck flexion strength  5/5  XII: tongue strength  Normal    Data Review Lab Results  Component Value Date   WBC 10.1 01/28/2021   HGB 14.4 01/28/2021   HCT 42.5 01/28/2021   MCV 97.5 01/28/2021   PLT 359 01/28/2021   Lab Results  Component Value Date   NA 134 (L) 01/28/2021   K 3.6 01/28/2021   CL 99 01/28/2021   CO2 26 01/28/2021   BUN 17 01/28/2021   CREATININE 0.88 01/28/2021   GLUCOSE 153 (H) 01/28/2021   Lab Results  Component Value Date   INR 1.1 01/28/2021    MRI reviewed and shows a large left-sided subdural hematoma with significant mass-effect and midline shift.  Assessment/Plan:  Estimated body mass index is 23.68 kg/m as calculated  from the following:   Height as of this encounter: 5\' 11"  (1.803 m).   Weight as of this encounter: 77 kg. Patient admitted for left craniotomy for subdural hematoma.  I have described the surgery to the patient, his wife, and his oldest granddaughter.  They understand the risks of the surgery include but are not limited to bleeding, infection, stroke, numbness, weakness, loss of vision, paralysis, rebleeding, need for further surgery, lack of relief of symptoms, worsening symptoms, seizure, and anesthesia risk including DVT pneumonia MI and death.  They agree to proceed.  I explained the condition and procedure to the patient and answered any questions.  Patient wishes to proceed with procedure as planned. Understands risks/ benefits and typical outcomes of procedure.   [name] 01/28/2021 5:14 PM

## 2021-01-28 NOTE — Anesthesia Procedure Notes (Signed)
Procedure Name: Intubation Date/Time: 01/28/2021 5:55 PM Performed by: Inda Coke, CRNA Pre-anesthesia Checklist: Patient identified, Emergency Drugs available, Suction available and Patient being monitored Patient Re-evaluated:Patient Re-evaluated prior to induction Oxygen Delivery Method: Circle System Utilized Preoxygenation: Pre-oxygenation with 100% oxygen Induction Type: IV induction Ventilation: Mask ventilation without difficulty Laryngoscope Size: Mac and 4 Grade View: Grade II Tube type: Oral Tube size: 7.5 mm Number of attempts: 1 Airway Equipment and Method: Stylet and Oral airway Placement Confirmation: ETT inserted through vocal cords under direct vision,  positive ETCO2 and breath sounds checked- equal and bilateral Secured at: 23 cm Tube secured with: Tape Dental Injury: Teeth and Oropharynx as per pre-operative assessment

## 2021-01-28 NOTE — Transfer of Care (Signed)
Immediate Anesthesia Transfer of Care Note  Patient: [name]  Procedure(s) Performed: CRANIOTOMY HEMATOMA EVACUATION SUBDURAL (Left Head)  Patient Location: PACU  Anesthesia Type:General  Level of Consciousness: awake, alert  and oriented  Airway & Oxygen Therapy: Patient Spontanous Breathing and Patient connected to nasal cannula oxygen  Post-op Assessment: Report given to RN, Post -op Vital signs reviewed and stable and Patient moving all extremities  Post vital signs: Reviewed and stable  Last Vitals:  Vitals Value Taken Time  BP 136/86 01/28/21 1927  Temp    Pulse 62 01/28/21 1932  Resp 18 01/28/21 1932  SpO2 100 % 01/28/21 1932  Vitals shown include unvalidated device data.  Last Pain:  Vitals:   01/28/21 1703  TempSrc: Temporal  PainSc:       Patients Stated Pain Goal: 0 (41/28/20 8138)  Complications: No complications documented.

## 2021-01-29 ENCOUNTER — Encounter (HOSPITAL_COMMUNITY): Payer: Self-pay | Admitting: Neurological Surgery

## 2021-01-29 ENCOUNTER — Inpatient Hospital Stay (HOSPITAL_COMMUNITY): Payer: Medicare HMO

## 2021-01-29 LAB — CBC WITH DIFFERENTIAL/PLATELET
Basophils Absolute: 0 10*3/uL (ref 0.0–0.2)
Basos: 0 %
EOS (ABSOLUTE): 0.1 10*3/uL (ref 0.0–0.4)
Eos: 1 %
Hematocrit: 41.3 % (ref 37.5–51.0)
Hemoglobin: 14.3 g/dL (ref 13.0–17.7)
Immature Grans (Abs): 0.1 10*3/uL (ref 0.0–0.1)
Immature Granulocytes: 1 %
Lymphocytes Absolute: 2 10*3/uL (ref 0.7–3.1)
Lymphs: 19 %
MCH: 32.6 pg (ref 26.6–33.0)
MCHC: 34.6 g/dL (ref 31.5–35.7)
MCV: 94 fL (ref 79–97)
Monocytes Absolute: 1 10*3/uL — ABNORMAL HIGH (ref 0.1–0.9)
Monocytes: 9 %
Neutrophils Absolute: 7.3 10*3/uL — ABNORMAL HIGH (ref 1.4–7.0)
Neutrophils: 70 %
Platelets: 350 10*3/uL (ref 150–450)
RBC: 4.38 x10E6/uL (ref 4.14–5.80)
RDW: 13.1 % (ref 11.6–15.4)
WBC: 10.4 10*3/uL (ref 3.4–10.8)

## 2021-01-29 LAB — CMP14+EGFR
ALT: 39 IU/L (ref 0–44)
AST: 17 IU/L (ref 0–40)
Albumin/Globulin Ratio: 1.5 (ref 1.2–2.2)
Albumin: 4.1 g/dL (ref 3.8–4.8)
Alkaline Phosphatase: 109 IU/L (ref 44–121)
BUN/Creatinine Ratio: 14 (ref 10–24)
BUN: 13 mg/dL (ref 8–27)
Bilirubin Total: 0.5 mg/dL (ref 0.0–1.2)
CO2: 22 mmol/L (ref 20–29)
Calcium: 9.8 mg/dL (ref 8.6–10.2)
Chloride: 102 mmol/L (ref 96–106)
Creatinine, Ser: 0.9 mg/dL (ref 0.76–1.27)
GFR calc Af Amer: 105 mL/min/{1.73_m2} (ref >59)
GFR calc non Af Amer: 91 mL/min/{1.73_m2} (ref >59)
Globulin, Total: 2.7 g/dL (ref 1.5–4.5)
Glucose: 132 mg/dL — ABNORMAL HIGH (ref 65–99)
Potassium: 4.4 mmol/L (ref 3.5–5.2)
Sodium: 140 mmol/L (ref 134–144)
Total Protein: 6.8 g/dL (ref 6.0–8.5)

## 2021-01-29 LAB — THYROID PANEL WITH TSH
Free Thyroxine Index: 1.6 (ref 1.2–4.9)
T3 Uptake Ratio: 25 % (ref 24–39)
T4, Total: 6.2 ug/dL (ref 4.5–12.0)
TSH: 1.29 u[IU]/mL (ref 0.450–4.500)

## 2021-01-29 NOTE — Progress Notes (Signed)
Subjective: Patient reports some head soreness but no numbness tingling weakness or visual changes.  Headache better.  Objective: Vital signs in last 24 hours: Temp:  [96.7 F (35.9 C)-98.5 F (36.9 C)] 97.8 F (36.6 C) (02/15 0400) Pulse Rate:  [53-106] 86 (02/15 0700) Resp:  [12-21] 17 (02/15 0700) BP: (106-137)/(42-113) 106/42 (02/15 0700) SpO2:  [95 %-100 %] 98 % (02/15 0700) Arterial Line BP: (132-160)/(62-73) 140/72 (02/15 0700) Weight:  [77 kg-77.7 kg] 77 kg (02/14 1431)  Intake/Output from previous day: 02/14 0701 - 02/15 0700 In: 1923.9 [I.V.:1723.9; IV Piggyback:150] Out: 2060 [Urine:1900; Drains:135; Blood:25] Intake/Output this shift: No intake/output data recorded.  Neurologic: Grossly normal  Lab Results: Lab Results  Component Value Date   WBC 10.1 01/28/2021   HGB 14.4 01/28/2021   HCT 42.5 01/28/2021   MCV 97.5 01/28/2021   PLT 359 01/28/2021   Lab Results  Component Value Date   INR 1.1 01/28/2021   BMET Lab Results  Component Value Date   NA 134 (L) 01/28/2021   K 3.6 01/28/2021   CL 99 01/28/2021   CO2 26 01/28/2021   GLUCOSE 153 (H) 01/28/2021   BUN 17 01/28/2021   CREATININE 0.88 01/28/2021   CALCIUM 9.6 01/28/2021    Studies/Results: MR ANGIO HEAD WO CONTRAST  Result Date: 01/28/2021 CLINICAL DATA:  Balance problems. Dizziness. Mental status change, unknown cause. Worsening headache. Confusion. Nystagmus. EXAM: MRI HEAD WITHOUT CONTRAST MRA HEAD WITHOUT CONTRAST TECHNIQUE: Multiplanar, multiecho pulse sequences of the brain and surrounding structures were obtained without intravenous contrast. Angiographic images of the head were obtained using MRA technique without contrast. COMPARISON:  None. FINDINGS: MRI HEAD FINDINGS Brain: Large left cerebral convexity subdural hematoma extending into the interhemispheric fissure posteriorly, with increased signal on T1 and T2, consistent with subacute bleed. The hematoma measures up to 2.6 cm in  thickness with prominent mass effect on the left cerebral hemisphere and midbrain with effacement of the cerebral sulci, near effacement of the left lateral and third ventricles common narrowing of the left ambient cistern and a 1.4 cm rightward midline shift with medialization of the left uncus. The right lateral ventricle is mildly dilated. No evidence of an acute infarct or mass lesion. Vascular: Normal flow voids. Skull and upper cervical spine: Normal marrow signal. Sinuses/Orbits: Negative. MRA HEAD FINDINGS The visualized portions of the distal cervical and intracranial internal carotid arteries are widely patent with normal flow related enhancement. Medialization of the bilateral ACA and left MCA vascular tree due to mass effect from left convexity subdural hematoma. The bilateral anterior cerebral arteries and middle cerebral arteries are widely patent with antegrade flow without high-grade flow-limiting stenosis or proximal branch occlusion. No intracranial aneurysm within the anterior circulation. The vertebral arteries are widely patent with antegrade flow. The posterior inferior cerebral arteries are normal. Vertebrobasilar junction and basilar artery are widely patent with antegrade flow without evidence of basilar stenosis or aneurysm. Medialization of the left PCA P2 segment due to mass effect from left convexity subdural hematoma. Posterior cerebral arteries are patent. No intracranial aneurysm within the posterior circulation. IMPRESSION: 1. Large left cerebral convexity subdural hematoma extending into the interhemispheric fissure posteriorly, measuring up to 2.6 cm in thickness with prominent mass effect on the left cerebral hemisphere and midbrain, resulting in 1.4 cm rightward midline shift with medialization of the left uncus. 2. Mild dilation of the right lateral ventricle. 3. No large vessel occlusion or intracranial aneurysm identified. These results were called by telephone at the time of  interpretation on 01/28/2021 at 2:56 pm to provider Dr. Melina Copa, who verbally acknowledged these results. Electronically Signed   By: Pedro Earls M.D.   On: 01/28/2021 14:59   MR Brain Wo Contrast  Result Date: 01/28/2021 CLINICAL DATA:  Balance problems. Dizziness. Mental status change, unknown cause. Worsening headache. Confusion. Nystagmus. EXAM: MRI HEAD WITHOUT CONTRAST MRA HEAD WITHOUT CONTRAST TECHNIQUE: Multiplanar, multiecho pulse sequences of the brain and surrounding structures were obtained without intravenous contrast. Angiographic images of the head were obtained using MRA technique without contrast. COMPARISON:  None. FINDINGS: MRI HEAD FINDINGS Brain: Large left cerebral convexity subdural hematoma extending into the interhemispheric fissure posteriorly, with increased signal on T1 and T2, consistent with subacute bleed. The hematoma measures up to 2.6 cm in thickness with prominent mass effect on the left cerebral hemisphere and midbrain with effacement of the cerebral sulci, near effacement of the left lateral and third ventricles common narrowing of the left ambient cistern and a 1.4 cm rightward midline shift with medialization of the left uncus. The right lateral ventricle is mildly dilated. No evidence of an acute infarct or mass lesion. Vascular: Normal flow voids. Skull and upper cervical spine: Normal marrow signal. Sinuses/Orbits: Negative. MRA HEAD FINDINGS The visualized portions of the distal cervical and intracranial internal carotid arteries are widely patent with normal flow related enhancement. Medialization of the bilateral ACA and left MCA vascular tree due to mass effect from left convexity subdural hematoma. The bilateral anterior cerebral arteries and middle cerebral arteries are widely patent with antegrade flow without high-grade flow-limiting stenosis or proximal branch occlusion. No intracranial aneurysm within the anterior circulation. The vertebral  arteries are widely patent with antegrade flow. The posterior inferior cerebral arteries are normal. Vertebrobasilar junction and basilar artery are widely patent with antegrade flow without evidence of basilar stenosis or aneurysm. Medialization of the left PCA P2 segment due to mass effect from left convexity subdural hematoma. Posterior cerebral arteries are patent. No intracranial aneurysm within the posterior circulation. IMPRESSION: 1. Large left cerebral convexity subdural hematoma extending into the interhemispheric fissure posteriorly, measuring up to 2.6 cm in thickness with prominent mass effect on the left cerebral hemisphere and midbrain, resulting in 1.4 cm rightward midline shift with medialization of the left uncus. 2. Mild dilation of the right lateral ventricle. 3. No large vessel occlusion or intracranial aneurysm identified. These results were called by telephone at the time of interpretation on 01/28/2021 at 2:56 pm to provider Dr. Melina Copa, who verbally acknowledged these results. Electronically Signed   By: Pedro Earls M.D.   On: 01/28/2021 14:59    Assessment/Plan: He looks really good.  CT scan looks much better.  Left shift.  Drains removed.  Will transfer to progressive care today.  PT OT.  Estimated body mass index is 23.68 kg/m as calculated from the following:   Height as of this encounter: 5\' 11"  (1.803 m).   Weight as of this encounter: 77 kg.    LOS: 1 day    [name] 01/29/2021, 8:19 AM

## 2021-01-29 NOTE — Evaluation (Signed)
Occupational Therapy Evaluation Patient Details Name: Derrick [name] MRN: [id number] DOB: 12/05/1956 Today's Date: 01/29/2021    History of Present Illness Patient is a 65 y.o. male admitted for L SDH. Onset of symptoms was 2 weeks ago, when he reports an unwitnessed fall, symptoms progressively worsened. Pt underwent L crani for evacuation of L SDH. PMH: anxiety, bipolar, depression, CAD PSH: heart cath.   Clinical Impression   Patient is s/p L craniotomy surgery resulting in functional limitations due to the deficits listed below (see OT problem list). Pt currently with mild balance deficits with light sensitivity. Encouraged wife to bring in sunglasses to help with sensitivity. OT to see one more session for adls with increased dynamic balance challenges.  Patient will benefit from skilled OT acutely to increase independence and safety with ADLS to allow discharge HOME.     Follow Up Recommendations  No OT follow up;Follow surgeon's recommendation for DC plan and follow-up therapies    Equipment Recommendations  None recommended by OT    Recommendations for Other Services       Precautions / Restrictions Precautions Precautions: Fall Precaution Comments: L frontal skull incision Restrictions Weight Bearing Restrictions: No      Mobility Bed Mobility               General bed mobility comments: pt in chair upon arrival    Transfers Overall transfer level: Needs assistance Equipment used: None Transfers: Sit to/from Stand Sit to Stand: Min guard         General transfer comment: increased time, resting tremor at baseline, used bilat armrests    Balance Overall balance assessment: Mild deficits observed, not formally tested                                         ADL either performed or assessed with clinical judgement   ADL Overall ADL's : Needs assistance/impaired Eating/Feeding: Modified independent;Sitting   Grooming: Oral care;Min  guard;Standing Grooming Details (indicate cue type and reason): utilized a cup for rinsing able to bend over sink surface to spit             Lower Body Dressing: Min guard;Sit to/from stand Lower Body Dressing Details (indicate cue type and reason): able to figure 4 cross . educated on sitting to avoid falls and encouraged to limit bending head downward to decr pain Toilet Transfer: Min guard           Functional mobility during ADLs: Min guard       Vision         Perception     Praxis      Pertinent Vitals/Pain Pain Assessment: 0-10 Pain Score: 5  Pain Location: incision Pain Descriptors / Indicators: Discomfort Pain Intervention(s): Monitored during session;Premedicated before session;Repositioned     Hand Dominance Right   Extremity/Trunk Assessment Upper Extremity Assessment Upper Extremity Assessment: Overall WFL for tasks assessed   Lower Extremity Assessment Lower Extremity Assessment: Overall WFL for tasks assessed   Cervical / Trunk Assessment Cervical / Trunk Assessment: Other exceptions Cervical / Trunk Exceptions: skull incision on L   Communication Communication Communication: No difficulties   Cognition Arousal/Alertness: Awake/alert Behavior During Therapy: WFL for tasks assessed/performed Overall Cognitive Status: Within Functional Limits for tasks assessed  General Comments: able to follow multistep commands, oriented x4   General Comments  pt with L hemi skull incision with staples, no active drainage HR 120-130 with activity    Exercises     Shoulder Instructions      Home Living Family/patient expects to be discharged to:: Private residence Living Arrangements: Spouse/significant other Available Help at Discharge: Family;Available PRN/intermittently (wife works but can stay home) Type of Home: House Home Access: Ramped entrance   Entrance Stairs-Rails: None Home Layout: One  level     Bathroom Shower/Tub: Occupational psychologist: [place]: None          Prior Functioning/Environment Level of Independence: Independent                 OT Problem List:        OT Treatment/Interventions:      OT Goals(Current goals can be found in the care plan section) Acute Rehab OT Goals Patient Stated Goal: home  OT Frequency: Min 2X/week   Barriers to D/C:            Co-evaluation PT/OT/SLP Co-Evaluation/Treatment: Yes Reason for Co-Treatment: To address functional/ADL transfers PT goals addressed during session: Mobility/safety with mobility OT goals addressed during session: ADL's and self-care;Proper use of Adaptive equipment and DME      AM-PAC OT "6 Clicks" Daily Activity     Outcome Measure Help from another person eating meals?: None Help from another person taking care of personal grooming?: None Help from another person toileting, which includes using toliet, bedpan, or urinal?: A Little Help from another person bathing (including washing, rinsing, drying)?: A Little Help from another person to put on and taking off regular upper body clothing?: None Help from another person to put on and taking off regular lower body clothing?: A Little 6 Click Score: 21   End of Session Nurse Communication: Mobility status;Precautions  Activity Tolerance: Patient tolerated treatment well Patient left: in chair;with call bell/phone within reach;with family/visitor present  OT Visit Diagnosis: Unsteadiness on feet (R26.81)                Time: 1000-1028 OT Time Calculation (min): 28 min Charges:  OT General Charges $OT Visit: 1 Visit OT Evaluation $OT Eval Moderate Complexity: 1 Mod   Derrick [name], OTR/L  Acute Rehabilitation Services Pager: [phone] Office: [phone] .   Jeri Modena 01/29/2021, 1:37 PM

## 2021-01-29 NOTE — Evaluation (Signed)
Physical Therapy Evaluation Patient Details Name: Derrick [name] MRN: [id number] DOB: Oct 01, 1956 Today's Date: 01/29/2021   History of Present Illness  Patient is a 65 y.o. male admitted for L SDH. Onset of symptoms was 2 weeks ago, when he reports an unwitnessed fall, symptoms progressively worsened. Pt underwent L crani for evacuation of L SDH. PMH: anxiety, bipolar, depression, CAD PSH: heart cath.    Clinical Impression  Pt admitted with above. Pt with report of 5/10 headache/incisional pain. Pt with resting tremor at baseline. Pt functioning at near baseline. Pt with light sensitivity, recommended sun glasses. Pts wife able to take FMLA for as long as needed. Recommended talking to MD at follow up if patient feels he is unable to do things at home that he was able to do before regarding outpt PT and OT. Pt currently functioning at min guard level. Acute PT to cont to follow.    Follow Up Recommendations No PT follow up;Supervision/Assistance - 24 hour (24/7 initially)    Equipment Recommendations  None recommended by PT    Recommendations for Other Services       Precautions / Restrictions Precautions Precautions: Fall Precaution Comments: L frontal skull incision Restrictions Weight Bearing Restrictions: No      Mobility  Bed Mobility               General bed mobility comments: pt in chair upon arrival    Transfers Overall transfer level: Needs assistance Equipment used: None Transfers: Sit to/from Stand Sit to Stand: Min guard         General transfer comment: increased time, resting tremor at baseline, used bilat armrests  Ambulation/Gait Ambulation/Gait assistance: Min guard Gait Distance (Feet): 150 Feet Assistive device: None Gait Pattern/deviations: Step-through pattern;Decreased stride length Gait velocity: dec   General Gait Details: pt mildly shaky but has resting tremor at baseline, no episode of LOB, pt denies dizziness or worsening of light  sensitivity however kept eyes looking down  Stairs            Wheelchair Mobility    Modified Rankin (Stroke Patients Only)       Balance Overall balance assessment: Mild deficits observed, not formally tested                                           Pertinent Vitals/Pain Pain Assessment: 0-10 Pain Score: 5  Pain Location: incision Pain Descriptors / Indicators: Discomfort Pain Intervention(s): Monitored during session    Home Living Family/patient expects to be discharged to:: Private residence Living Arrangements: Spouse/significant other Available Help at Discharge: Family;Available PRN/intermittently (wife works but can stay home) Type of Home: House Home Access: Ramped entrance Entrance Stairs-Rails: None   Home Layout: One [place]: None      Prior Function Level of Independence: Independent               Hand Dominance   Dominant Hand: Right    Extremity/Trunk Assessment   Upper Extremity Assessment Upper Extremity Assessment: Defer to OT evaluation    Lower Extremity Assessment Lower Extremity Assessment: Overall WFL for tasks assessed (resting tremor at baseline)    Cervical / Trunk Assessment Cervical / Trunk Assessment: Other exceptions Cervical / Trunk Exceptions: skull incision on L  Communication   Communication: No difficulties  Cognition Arousal/Alertness: Awake/alert Behavior During Therapy: WFL for tasks assessed/performed Overall Cognitive Status: Within  Functional Limits for tasks assessed                                 General Comments: able to follow multistep commands, oriented x4      General Comments General comments (skin integrity, edema, etc.): pt with L hemi skull incision with staples, no active drainage    Exercises     Assessment/Plan    PT Assessment Patient needs continued PT services  PT Problem List Decreased activity tolerance;Decreased  balance;Decreased mobility;Decreased coordination       PT Treatment Interventions DME instruction;Gait training;Stair training;Functional mobility training;Therapeutic exercise;Balance training;Therapeutic activities;Neuromuscular re-education    PT Goals (Current goals can be found in the Care Plan section)  Acute Rehab PT Goals Patient Stated Goal: home PT Goal Formulation: With patient Time For Goal Achievement: 02/12/21 Potential to Achieve Goals: Good Additional Goals Additional Goal #1: Pt to score >19 on DGI to indicant minimal falls risk.    Frequency Min 4X/week   Barriers to discharge        Co-evaluation PT/OT/SLP Co-Evaluation/Treatment: Yes Reason for Co-Treatment: To address functional/ADL transfers PT goals addressed during session: Mobility/safety with mobility         AM-PAC PT "6 Clicks" Mobility  Outcome Measure Help needed turning from your back to your side while in a flat bed without using bedrails?: None Help needed moving from lying on your back to sitting on the side of a flat bed without using bedrails?: None Help needed moving to and from a bed to a chair (including a wheelchair)?: None Help needed standing up from a chair using your arms (e.g., wheelchair or bedside chair)?: A Little Help needed to walk in hospital room?: A Little Help needed climbing 3-5 steps with a railing? : A Little 6 Click Score: 21    End of Session Equipment Utilized During Treatment: Gait belt Activity Tolerance: Patient tolerated treatment well Patient left: in bed;with call bell/phone within reach Nurse Communication: Mobility status PT Visit Diagnosis: Unsteadiness on feet (R26.81)    Time: 1000-1028 PT Time Calculation (min) (ACUTE ONLY): 28 min   Charges:   PT Evaluation $PT Eval Moderate Complexity: 1 Mod          Kittie Plater, PT, DPT Acute Rehabilitation Services Pager #: [phone] Office #: [phone]   Berline Lopes 01/29/2021, 10:45  AM

## 2021-01-29 NOTE — Progress Notes (Signed)
Pt walked over from ICU to 4NP12. VS stable. Pt's wife at bedside. Belongings at bedside included glasses and partial plates upper and lower. Bed in lowest position and call bell within reach.

## 2021-01-30 MED ORDER — HYDROCODONE-ACETAMINOPHEN 5-325 MG PO TABS: 1.0000 | ORAL_TABLET | Freq: Four times a day (QID) | ORAL | 0 refills | Status: DC | PRN

## 2021-01-30 NOTE — Progress Notes (Signed)
Occupational Therapy Treatment Patient Details Name: Derrick [name] MRN: [id number] DOB: 02-07-1956 Today's Date: 01/30/2021    History of present illness Patient is a 65 y.o. male admitted for L SDH. Onset of symptoms was 2 weeks ago, when he reports an unwitnessed fall, symptoms progressively worsened. Pt underwent L crani for evacuation of L SDH. PMH: anxiety, bipolar, depression, CAD PSH: heart cath.   OT comments  Pt making steady progress towards OT goals this session. Pt receive supine in bed eager to mobilize. Pt reports no pain and wanting to DC home today. Pt completed household distance functional mobility from EOB<>BR with min guard assist. Pt completed standing oral care at sink with supervision with no UE support or LOB, pt reports baseline tremors however pt demonstrates good carryover of compensatory methods to accomodate for tremors during ADLs. Pt HR increase to 140 bpm briefly during ADLs but then returns to [facility]. Pt reports light sensitivity is improving, offered compensatory methods if light sensitivity persists. DC plan currently remains appropriate, will continue to follow acutely per POC.    Follow Up Recommendations  No OT follow up;Follow surgeon's recommendation for DC plan and follow-up therapies    Equipment Recommendations  None recommended by OT    Recommendations for Other Services      Precautions / Restrictions Precautions Precautions: Fall Precaution Comments: L frontal skull incision Restrictions Weight Bearing Restrictions: No       Mobility Bed Mobility Overal bed mobility: Modified Independent             General bed mobility comments: no assist needed, use of bed rail on R and elevated HOB  Transfers Overall transfer level: Needs assistance Equipment used: None Transfers: Sit to/from Stand Sit to Stand: Supervision         General transfer comment: supervision for safety    Balance Overall balance assessment: Needs  assistance Sitting-balance support: No upper extremity supported;Feet supported Sitting balance-Leahy Scale: Good Sitting balance - Comments: reaching out of BOS to adjust socks with no LOB   Standing balance support: No upper extremity supported;During functional activity Standing balance-Leahy Scale: Good Standing balance comment: pt able to complete UB ADLS at sink with no UE support or LOB                           ADL either performed or assessed with clinical judgement   ADL Overall ADL's : Needs assistance/impaired     Grooming: Oral care;Standing;Supervision/safety Grooming Details (indicate cue type and reason): able to stand at sink with no UE support to brush teeth with no LOB, pt reports baseline tremors with pt noted to use compensatory methods to maintain grasp on tooth brush     Lower Body Bathing: Supervison/ safety;Sitting/lateral leans Lower Body Bathing Details (indicate cue type and reason): simulated via LB dressing task     Lower Body Dressing: Supervision/safety;Sitting/lateral leans Lower Body Dressing Details (indicate cue type and reason): pt able to reach down to adjust socks from recliner with no reports of dizziness or LOB, pt can also figure four Toilet Transfer: Min guard;Ambulation Toilet Transfer Details (indicate cue type and reason): simulated via functional mobility       Tub/Shower Transfer Details (indicate cue type and reason): pt reports walkin shower at home Functional mobility during ADLs: Min guard General ADL Comments: pt making excellent progress, pt reports improvements to light sensivitity and reports improved activity tolerance, eager to DC home  Vision Baseline Vision/History: Wears glasses Wears Glasses: At all times Additional Comments: pt reports light sensitivity has improved, discussed compensatory methods for home if sensitivity persists.   Perception     Praxis      Cognition Arousal/Alertness:  Awake/alert Behavior During Therapy: WFL for tasks assessed/performed Overall Cognitive Status: Within Functional Limits for tasks assessed                                          Exercises     Shoulder Instructions       General Comments HR increase to 140 bpm briefly with standing grooming tasks, pt reports "that happens when I've been laying down and get up"    Pertinent Vitals/ Pain       Pain Assessment: No/denies pain  Home Living                                          Prior Functioning/Environment              Frequency  Min 2X/week        Progress Toward Goals  OT Goals(current goals can now be found in the care plan section)  Progress towards OT goals: Progressing toward goals  Acute Rehab OT Goals Patient Stated Goal: ready to go home  Plan Discharge plan remains appropriate;Frequency remains appropriate    Co-evaluation                 AM-PAC OT "6 Clicks" Daily Activity     Outcome Measure   Help from another person eating meals?: None Help from another person taking care of personal grooming?: None Help from another person toileting, which includes using toliet, bedpan, or urinal?: A Little Help from another person bathing (including washing, rinsing, drying)?: A Little Help from another person to put on and taking off regular upper body clothing?: None Help from another person to put on and taking off regular lower body clothing?: None 6 Click Score: 22    End of Session    OT Visit Diagnosis: Unsteadiness on feet (R26.81)   Activity Tolerance Patient tolerated treatment well   Patient Left in chair;with call bell/phone within reach;with family/visitor present   Nurse Communication Mobility status        Time: 8270-7867 OT Time Calculation (min): 16 min  Charges: OT General Charges $OT Visit: 1 Visit OT Treatments $Self Care/Home Management : 8-22 mins  Harley Alto., COTA/L Acute  Rehabilitation Services [phone]   Precious Haws 01/30/2021, 8:41 AM

## 2021-01-30 NOTE — Discharge Summary (Signed)
Physician Discharge Summary  Patient ID: Derrick [name] MRN: [id number] DOB/AGE: 07/24/1956 65 y.o.  Admit date: 01/28/2021 Discharge date: 01/30/2021  Admission Diagnoses: Left subdural hematoma     Discharge Diagnoses: same   Discharged Condition: good  Hospital Course: The patient was admitted on 01/28/2021 and taken to the operating room where the patient underwent left craniotomy for evacuation of hematoma. The patient tolerated the procedure well and was taken to the recovery room and then to the ICU in stable condition. The hospital course was routine. There were no complications. The wound remained clean dry and intact. Pt had appropriate head soreness. No complaints of arm pain or new N/T/W. The patient remained afebrile with stable vital signs, and tolerated a regular diet. The patient continued to increase activities, and pain was well controlled with oral pain medications.   Consults: None  Significant Diagnostic Studies:  Results for orders placed or performed during the hospital encounter of 01/28/21  Resp Panel by RT-PCR (Flu A&B, Covid) Nasopharyngeal Swab   Specimen: Nasopharyngeal Swab; Nasopharyngeal(NP) swabs in vial transport medium  Result Value Ref Range   SARS Coronavirus 2 by RT PCR NEGATIVE NEGATIVE   Influenza A by PCR NEGATIVE NEGATIVE   Influenza B by PCR NEGATIVE NEGATIVE  MRSA PCR Screening   Specimen: Nasal Mucosa; Nasopharyngeal  Result Value Ref Range   MRSA by PCR NEGATIVE NEGATIVE  CBC with Differential  Result Value Ref Range   WBC 10.1 4.0 - 10.5 K/uL   RBC 4.36 4.22 - 5.81 MIL/uL   Hemoglobin 14.4 13.0 - 17.0 g/dL   HCT 42.5 39.0 - 52.0 %   MCV 97.5 80.0 - 100.0 fL   MCH 33.0 26.0 - 34.0 pg   MCHC 33.9 30.0 - 36.0 g/dL   RDW 12.7 11.5 - 15.5 %   Platelets 359 150 - 400 K/uL   nRBC 0.0 0.0 - 0.2 %   Neutrophils Relative % 76 %   Neutro Abs 7.5 1.7 - 7.7 K/uL   Lymphocytes Relative 16 %   Lymphs Abs 1.6 0.7 - 4.0 K/uL   Monocytes  Relative 8 %   Monocytes Absolute 0.8 0.1 - 1.0 K/uL   Eosinophils Relative 0 %   Eosinophils Absolute 0.0 0.0 - 0.5 K/uL   Basophils Relative 0 %   Basophils Absolute 0.0 0.0 - 0.1 K/uL   Immature Granulocytes 0 %   Abs Immature Granulocytes 0.04 0.00 - 0.07 K/uL  Comprehensive metabolic panel  Result Value Ref Range   Sodium 134 (L) 135 - 145 mmol/L   Potassium 3.6 3.5 - 5.1 mmol/L   Chloride 99 98 - 111 mmol/L   CO2 26 22 - 32 mmol/L   Glucose, Bld 153 (H) 70 - 99 mg/dL   BUN 17 8 - 23 mg/dL   Creatinine, Ser 0.88 0.61 - 1.24 mg/dL   Calcium 9.6 8.9 - 10.3 mg/dL   Total Protein 7.8 6.5 - 8.1 g/dL   Albumin 4.2 3.5 - 5.0 g/dL   AST 23 15 - 41 U/L   ALT 41 0 - 44 U/L   Alkaline Phosphatase 88 38 - 126 U/L   Total Bilirubin 0.6 0.3 - 1.2 mg/dL   GFR, Estimated >60 >60 mL/min   Anion gap 9 5 - 15  Protime-INR  Result Value Ref Range   Prothrombin Time 13.4 11.4 - 15.2 seconds   INR 1.1 0.8 - 1.2  Glucose, capillary  Result Value Ref Range   Glucose-Capillary 101 (H) 70 -  99 mg/dL  Glucose, capillary  Result Value Ref Range   Glucose-Capillary 155 (H) 70 - 99 mg/dL  CBG monitoring, ED  Result Value Ref Range   Glucose-Capillary 178 (H) 70 - 99 mg/dL  Type and screen [facility]  Result Value Ref Range   ABO/RH(D) O POS    Antibody Screen NEG    Sample Expiration      01/31/2021,2359 Performed at [facility] [address] [place], [place] [address]   ABO/Rh  Result Value Ref Range   ABO/RH(D)      O POS Performed at [facility] [address], [place],  [address]     CT HEAD WO CONTRAST  Result Date: 01/29/2021 CLINICAL DATA:  Subdural hemorrhage EXAM: CT HEAD WITHOUT CONTRAST TECHNIQUE: Contiguous axial images were obtained from the base of the skull through the vertex without intravenous contrast. COMPARISON:  Brain MRI from yesterday FINDINGS: Brain: Interval decompression of subdural collection on the left. Residual  collection primarily from gas and low-density fluid with small volume high-density hemorrhage closest to the bone flap. Mass effect has improved with midline shift now measuring 7 mm as compared to 14 mm previously. No complicating infarct or entrapment. A drain is in place. Vascular: Negative Skull: Unremarkable left-sided craniotomy. Sinuses/Orbits: Negative IMPRESSION: Left subdural decompression with remaining collection primarily of gas and low-density fluid. Midline shift has decreased to 7 mm. Electronically Signed   By: Monte Fantasia M.D.   On: 01/29/2021 08:28   MR ANGIO HEAD WO CONTRAST  Result Date: 01/28/2021 CLINICAL DATA:  Balance problems. Dizziness. Mental status change, unknown cause. Worsening headache. Confusion. Nystagmus. EXAM: MRI HEAD WITHOUT CONTRAST MRA HEAD WITHOUT CONTRAST TECHNIQUE: Multiplanar, multiecho pulse sequences of the brain and surrounding structures were obtained without intravenous contrast. Angiographic images of the head were obtained using MRA technique without contrast. COMPARISON:  None. FINDINGS: MRI HEAD FINDINGS Brain: Large left cerebral convexity subdural hematoma extending into the interhemispheric fissure posteriorly, with increased signal on T1 and T2, consistent with subacute bleed. The hematoma measures up to 2.6 cm in thickness with prominent mass effect on the left cerebral hemisphere and midbrain with effacement of the cerebral sulci, near effacement of the left lateral and third ventricles common narrowing of the left ambient cistern and a 1.4 cm rightward midline shift with medialization of the left uncus. The right lateral ventricle is mildly dilated. No evidence of an acute infarct or mass lesion. Vascular: Normal flow voids. Skull and upper cervical spine: Normal marrow signal. Sinuses/Orbits: Negative. MRA HEAD FINDINGS The visualized portions of the distal cervical and intracranial internal carotid arteries are widely patent with normal flow  related enhancement. Medialization of the bilateral ACA and left MCA vascular tree due to mass effect from left convexity subdural hematoma. The bilateral anterior cerebral arteries and middle cerebral arteries are widely patent with antegrade flow without high-grade flow-limiting stenosis or proximal branch occlusion. No intracranial aneurysm within the anterior circulation. The vertebral arteries are widely patent with antegrade flow. The posterior inferior cerebral arteries are normal. Vertebrobasilar junction and basilar artery are widely patent with antegrade flow without evidence of basilar stenosis or aneurysm. Medialization of the left PCA P2 segment due to mass effect from left convexity subdural hematoma. Posterior cerebral arteries are patent. No intracranial aneurysm within the posterior circulation. IMPRESSION: 1. Large left cerebral convexity subdural hematoma extending into the interhemispheric fissure posteriorly, measuring up to 2.6 cm in thickness with prominent mass effect on  the left cerebral hemisphere and midbrain, resulting in 1.4 cm rightward midline shift with medialization of the left uncus. 2. Mild dilation of the right lateral ventricle. 3. No large vessel occlusion or intracranial aneurysm identified. These results were called by telephone at the time of interpretation on 01/28/2021 at 2:56 pm to provider Dr. Melina Copa, who verbally acknowledged these results. Electronically Signed   By: Pedro Earls M.D.   On: 01/28/2021 14:59   MR Brain Wo Contrast  Result Date: 01/28/2021 CLINICAL DATA:  Balance problems. Dizziness. Mental status change, unknown cause. Worsening headache. Confusion. Nystagmus. EXAM: MRI HEAD WITHOUT CONTRAST MRA HEAD WITHOUT CONTRAST TECHNIQUE: Multiplanar, multiecho pulse sequences of the brain and surrounding structures were obtained without intravenous contrast. Angiographic images of the head were obtained using MRA technique without contrast.  COMPARISON:  None. FINDINGS: MRI HEAD FINDINGS Brain: Large left cerebral convexity subdural hematoma extending into the interhemispheric fissure posteriorly, with increased signal on T1 and T2, consistent with subacute bleed. The hematoma measures up to 2.6 cm in thickness with prominent mass effect on the left cerebral hemisphere and midbrain with effacement of the cerebral sulci, near effacement of the left lateral and third ventricles common narrowing of the left ambient cistern and a 1.4 cm rightward midline shift with medialization of the left uncus. The right lateral ventricle is mildly dilated. No evidence of an acute infarct or mass lesion. Vascular: Normal flow voids. Skull and upper cervical spine: Normal marrow signal. Sinuses/Orbits: Negative. MRA HEAD FINDINGS The visualized portions of the distal cervical and intracranial internal carotid arteries are widely patent with normal flow related enhancement. Medialization of the bilateral ACA and left MCA vascular tree due to mass effect from left convexity subdural hematoma. The bilateral anterior cerebral arteries and middle cerebral arteries are widely patent with antegrade flow without high-grade flow-limiting stenosis or proximal branch occlusion. No intracranial aneurysm within the anterior circulation. The vertebral arteries are widely patent with antegrade flow. The posterior inferior cerebral arteries are normal. Vertebrobasilar junction and basilar artery are widely patent with antegrade flow without evidence of basilar stenosis or aneurysm. Medialization of the left PCA P2 segment due to mass effect from left convexity subdural hematoma. Posterior cerebral arteries are patent. No intracranial aneurysm within the posterior circulation. IMPRESSION: 1. Large left cerebral convexity subdural hematoma extending into the interhemispheric fissure posteriorly, measuring up to 2.6 cm in thickness with prominent mass effect on the left cerebral hemisphere  and midbrain, resulting in 1.4 cm rightward midline shift with medialization of the left uncus. 2. Mild dilation of the right lateral ventricle. 3. No large vessel occlusion or intracranial aneurysm identified. These results were called by telephone at the time of interpretation on 01/28/2021 at 2:56 pm to provider Dr. Melina Copa, who verbally acknowledged these results. Electronically Signed   By: Pedro Earls M.D.   On: 01/28/2021 14:59    Antibiotics:  Anti-infectives (From admission, onward)   Start     Dose/Rate Route Frequency Ordered Stop   01/29/21 0200  ceFAZolin (ANCEF) IVPB 1 g/50 mL premix        1 g 100 mL/hr over 30 Minutes Intravenous Every 8 hours 01/28/21 2103 01/29/21 1011   01/28/21 1722  ceFAZolin (ANCEF) 2-4 GM/100ML-% IVPB       Note to Pharmacy: Henrine Screws   : cabinet override      01/28/21 1722 01/29/21 0529      Discharge Exam: Blood pressure 121/61, pulse 76, temperature 98 F (36.7  C), temperature source Oral, resp. rate 14, height 5\' 11"  (1.803 m), weight 77 kg, SpO2 98 %. Neurologic: Grossly normal Ambulating and voiding well, incision cdi  Discharge Medications:   Allergies as of 01/30/2021   No Known Allergies     Medication List    STOP taking these medications   aspirin 81 MG tablet     TAKE these medications   acetaminophen 500 MG tablet Commonly known as: TYLENOL Take 500 mg by mouth every 6 (six) hours as needed for mild pain.   atorvastatin 80 MG tablet Commonly known as: LIPITOR TAKE 1 TABLET EVERY DAY   divalproex 500 MG DR tablet Commonly known as: DEPAKOTE Take 4 tablets (2,000 mg total) by mouth at bedtime.   escitalopram 20 MG tablet Commonly known as: LEXAPRO TAKE 1 TABLET EVERY DAY What changed:   how much to take  how to take this  when to take this  additional instructions   ezetimibe 10 MG tablet Commonly known as: ZETIA Take 1 tablet (10 mg total) by mouth daily.   HYDROcodone-acetaminophen  5-325 MG tablet Commonly known as: NORCO/VICODIN Take 1 tablet by mouth every 6 (six) hours as needed for moderate pain.   ipratropium 0.06 % nasal spray Commonly known as: ATROVENT Place 2 sprays into both nostrils 3 (three) times daily as needed for congestion.   isosorbide mononitrate 30 MG 24 hr tablet Commonly known as: IMDUR Take 0.5 tablets (15 mg total) by mouth daily.   lamoTRIgine 100 MG tablet Commonly known as: LAMICTAL TAKE 1 AND 1/2 TABLETS EVERY DAY What changed: See the new instructions.   metoprolol tartrate 25 MG tablet Commonly known as: LOPRESSOR Take 1.5 tab PO BID What changed:   how much to take  how to take this  when to take this  additional instructions   montelukast 10 MG tablet Commonly known as: SINGULAIR Take 1 tablet (10 mg total) by mouth daily.   nitroGLYCERIN 0.4 MG SL tablet Commonly known as: NITROSTAT Place 1 tablet (0.4 mg total) under the tongue every 5 (five) minutes as needed for chest pain.   ondansetron 8 MG disintegrating tablet Commonly known as: ZOFRAN-ODT Take 8 mg by mouth every 8 (eight) hours as needed for nausea/vomiting.   pantoprazole 40 MG tablet Commonly known as: PROTONIX TAKE 1 TABLET EVERY DAY   rizatriptan 10 MG tablet Commonly known as: Maxalt Take 1 tablet (10 mg total) by mouth as needed for migraine. May repeat in 2 hours if needed       Disposition: home   Final Dx: Left craniotomy for evacuation of subdural hematoma  Discharge Instructions    Call MD for:  difficulty breathing, headache or visual disturbances   Complete by: As directed    Call MD for:  hives   Complete by: As directed    Call MD for:  persistant nausea and vomiting   Complete by: As directed    Call MD for:  redness, tenderness, or signs of infection (pain, swelling, redness, odor or green/yellow discharge around incision site)   Complete by: As directed    Call MD for:  severe uncontrolled pain   Complete by: As  directed    Call MD for:  temperature >100.4   Complete by: As directed    Diet - low sodium heart healthy   Complete by: As directed    Driving Restrictions   Complete by: As directed    No driving for 2 weeks, no riding in  the car for 1 week   Increase activity slowly   Complete by: As directed    No wound care   Complete by: As directed          Signed: Ocie Cornfield Meyran 01/30/2021, 1:47 PM

## 2021-01-30 NOTE — Progress Notes (Signed)
Physical Therapy Treatment Patient Details Name: Derrick [name] MRN: [id number] DOB: 1956/08/16 Today's Date: 01/30/2021    History of Present Illness Patient is a 65 y.o. male admitted for L SDH. Onset of symptoms was 2 weeks ago, when he reports an unwitnessed fall, symptoms progressively worsened. Pt underwent L crani for evacuation of L SDH. PMH: anxiety, bipolar, depression, CAD PSH: heart cath.    PT Comments    Pt progressing well towards all goals and reports he feels "a 100x better" than yesterday. Pt scored a 23/24 on DGI indicating minimal falls risk. Spouse reports "he looks great, better than I've seen him in 2 weeks". Pt continues to be safe to d/c home with spouse once medically stable. Acute PT to monitor pt while in hospital.    Follow Up Recommendations  No PT follow up;Supervision/Assistance - 24 hour     Equipment Recommendations  None recommended by PT    Recommendations for Other Services       Precautions / Restrictions Precautions Precautions: Fall Precaution Comments: L skull incision Restrictions Weight Bearing Restrictions: No    Mobility  Bed Mobility Overal bed mobility: Modified Independent             General bed mobility comments: pt up in chair upon PT arrival    Transfers Overall transfer level: Modified independent Equipment used: None Transfers: Sit to/from Stand Sit to Stand: Modified independent (Device/Increase time)         General transfer comment: no assist needed  Ambulation/Gait Ambulation/Gait assistance: Supervision Gait Distance (Feet): 600 Feet Assistive device: None Gait Pattern/deviations: WFL(Within Functional Limits) Gait velocity: wfl Gait velocity interpretation: >2.62 ft/sec, indicative of community ambulatory General Gait Details: pt continues with mild shakiness however this is his baseline per pt and spouse, no episode of LOB   Stairs Stairs: Yes Stairs assistance: Min guard Stair Management:  One rail Right;Alternating pattern Number of Stairs: 12 General stair comments: no difficulty or instability   Wheelchair Mobility    Modified Rankin (Stroke Patients Only)       Balance Overall balance assessment: No apparent balance deficits (not formally assessed) Sitting-balance support: No upper extremity supported;Feet supported Sitting balance-Leahy Scale: Good Sitting balance - Comments: reaching out of BOS to adjust socks with no LOB   Standing balance support: No upper extremity supported;During functional activity Standing balance-Leahy Scale: Good Standing balance comment: pt able to complete UB ADLS at sink with no UE support or LOB                 Standardized Balance Assessment Standardized Balance Assessment : Dynamic Gait Index   Dynamic Gait Index Level Surface: Normal Change in Gait Speed: Normal Gait with Horizontal Head Turns: Normal Gait with Vertical Head Turns: Normal Gait and Pivot Turn: Normal Step Over Obstacle: Normal Step Around Obstacles: Normal Steps: Mild Impairment Total Score: 23      Cognition Arousal/Alertness: Awake/alert Behavior During Therapy: WFL for tasks assessed/performed Overall Cognitive Status: Within Functional Limits for tasks assessed                                        Exercises      General Comments General comments (skin integrity, edema, etc.): HR increased to 148bpm at end of walking, reports SOB, SPO2 at 76 however no pleth, pt denies needing a rest, RN Notified      Pertinent Vitals/Pain  Pain Assessment: No/denies pain    Home Living                      Prior Function            PT Goals (current goals can now be found in the care plan section) Acute Rehab PT Goals Patient Stated Goal: ready to go home Progress towards PT goals: Progressing toward goals    Frequency    Min 3X/week      PT Plan Frequency needs to be updated    Co-evaluation               AM-PAC PT "6 Clicks" Mobility   Outcome Measure  Help needed turning from your back to your side while in a flat bed without using bedrails?: None Help needed moving from lying on your back to sitting on the side of a flat bed without using bedrails?: None Help needed moving to and from a bed to a chair (including a wheelchair)?: None Help needed standing up from a chair using your arms (e.g., wheelchair or bedside chair)?: None Help needed to walk in hospital room?: None Help needed climbing 3-5 steps with a railing? : A Little 6 Click Score: 23    End of Session   Activity Tolerance: Patient tolerated treatment well Patient left: in chair;with call bell/phone within reach;with family/visitor present Nurse Communication: Mobility status (tachy HR and drop in SpO2) PT Visit Diagnosis: Unsteadiness on feet (R26.81)     Time: 8466-5993 PT Time Calculation (min) (ACUTE ONLY): 14 min  Charges:  $Gait Training: 8-22 mins                     Kittie Plater, PT, DPT Acute Rehabilitation Services Pager #: [phone] Office #: [phone]'    [place] 01/30/2021, 9:04 AM

## 2021-01-31 ENCOUNTER — Telehealth: Payer: Self-pay | Admitting: *Deleted

## 2021-01-31 ENCOUNTER — Other Ambulatory Visit: Payer: Self-pay | Admitting: Family Medicine

## 2021-01-31 NOTE — Telephone Encounter (Signed)
Transition Care Management Unsuccessful Follow-up Telephone Call  Date of discharge and from where:  01/30/21 from [facility]  Attempts:  1st  Reason for unsuccessful TCM follow-up call:  Left voice message

## 2021-02-01 ENCOUNTER — Ambulatory Visit
Admission: RE | Admit: 2021-02-01 | Discharge: 2021-02-01 | Disposition: A | Discharge status: Home / Self Care | Payer: Medicare HMO | Source: Ambulatory Visit | Attending: Student | Admitting: Student

## 2021-02-01 ENCOUNTER — Other Ambulatory Visit: Payer: Self-pay

## 2021-02-01 ENCOUNTER — Other Ambulatory Visit: Payer: Self-pay | Admitting: Student

## 2021-02-01 DIAGNOSIS — S065XAA Traumatic subdural hemorrhage with loss of consciousness status unknown, initial encounter: Secondary | ICD-10-CM

## 2021-02-01 DIAGNOSIS — S065X9A Traumatic subdural hemorrhage with loss of consciousness of unspecified duration, initial encounter: Secondary | ICD-10-CM

## 2021-02-01 DIAGNOSIS — R4781 Slurred speech: Secondary | ICD-10-CM | POA: Diagnosis not present

## 2021-02-01 NOTE — Telephone Encounter (Signed)
Transition Care Management Unsuccessful Follow-up Telephone Call  Date of discharge and from where:  01/30/2021 from [facility]  Attempts:  2nd Attempt  Reason for unsuccessful TCM follow-up call:  Left voice message

## 2021-02-04 NOTE — Telephone Encounter (Signed)
Two attempts have been made and no call back - this encounter will be closed.

## 2021-02-06 ENCOUNTER — Other Ambulatory Visit: Payer: Self-pay | Admitting: Physician Assistant

## 2021-02-06 ENCOUNTER — Other Ambulatory Visit: Payer: Self-pay | Admitting: Student

## 2021-02-06 DIAGNOSIS — S065XAA Traumatic subdural hemorrhage with loss of consciousness status unknown, initial encounter: Secondary | ICD-10-CM

## 2021-02-06 DIAGNOSIS — S065X9A Traumatic subdural hemorrhage with loss of consciousness of unspecified duration, initial encounter: Secondary | ICD-10-CM

## 2021-02-07 ENCOUNTER — Ambulatory Visit: Payer: Medicare HMO | Admitting: Psychiatry

## 2021-02-07 ENCOUNTER — Other Ambulatory Visit: Payer: Self-pay

## 2021-02-08 NOTE — Telephone Encounter (Signed)
You can fill under my name Richardson Dopp, [name]    02/08/2021 1:18 PM

## 2021-02-11 ENCOUNTER — Ambulatory Visit
Admission: RE | Admit: 2021-02-11 | Discharge: 2021-02-11 | Disposition: A | Discharge status: Home / Self Care | Payer: Medicare HMO | Source: Ambulatory Visit | Attending: Student | Admitting: Student

## 2021-02-11 DIAGNOSIS — S065X0A Traumatic subdural hemorrhage without loss of consciousness, initial encounter: Secondary | ICD-10-CM | POA: Diagnosis not present

## 2021-02-11 DIAGNOSIS — G9389 Other specified disorders of brain: Secondary | ICD-10-CM | POA: Diagnosis not present

## 2021-02-11 DIAGNOSIS — S065XAA Traumatic subdural hemorrhage with loss of consciousness status unknown, initial encounter: Secondary | ICD-10-CM

## 2021-02-11 DIAGNOSIS — S065X9A Traumatic subdural hemorrhage with loss of consciousness of unspecified duration, initial encounter: Secondary | ICD-10-CM

## 2021-02-13 ENCOUNTER — Other Ambulatory Visit: Payer: Self-pay | Admitting: Family Medicine

## 2021-02-13 DIAGNOSIS — G43911 Migraine, unspecified, intractable, with status migrainosus: Secondary | ICD-10-CM

## 2021-02-14 ENCOUNTER — Telehealth: Payer: Self-pay | Admitting: Nurse Practitioner

## 2021-02-14 MED ORDER — EZETIMIBE 10 MG PO TABS: 10.0000 mg | ORAL_TABLET | Freq: Every day | ORAL | 1 refills | Status: DC

## 2021-02-14 NOTE — Telephone Encounter (Signed)
*  STAT* If patient is at the pharmacy, call can be transferred to refill team.   1. Which medications need to be refilled? (please list name of each medication and dose if known)   ezetimibe (ZETIA) 10 MG tablet     2. Which pharmacy/location (including street and city if local pharmacy) is medication to be sent to? [place], [place] [address]  3. Do they need a 30 day or 90 day supply? 90 day supply

## 2021-02-14 NOTE — Telephone Encounter (Signed)
Pt's medication was sent to pt's pharmacy as requested. Confirmation received.  °

## 2021-02-15 ENCOUNTER — Ambulatory Visit (INDEPENDENT_AMBULATORY_CARE_PROVIDER_SITE_OTHER): Payer: Medicare HMO | Admitting: Licensed Clinical Social Worker

## 2021-02-15 ENCOUNTER — Other Ambulatory Visit: Payer: Self-pay | Admitting: Neurological Surgery

## 2021-02-15 DIAGNOSIS — R413 Other amnesia: Secondary | ICD-10-CM

## 2021-02-15 DIAGNOSIS — E7800 Pure hypercholesterolemia, unspecified: Secondary | ICD-10-CM

## 2021-02-15 DIAGNOSIS — F3170 Bipolar disorder, currently in remission, most recent episode unspecified: Secondary | ICD-10-CM | POA: Diagnosis not present

## 2021-02-15 DIAGNOSIS — G4733 Obstructive sleep apnea (adult) (pediatric): Secondary | ICD-10-CM

## 2021-02-15 DIAGNOSIS — F5105 Insomnia due to other mental disorder: Secondary | ICD-10-CM | POA: Diagnosis not present

## 2021-02-15 DIAGNOSIS — S065XAA Traumatic subdural hemorrhage with loss of consciousness status unknown, initial encounter: Secondary | ICD-10-CM

## 2021-02-15 DIAGNOSIS — S065X9A Traumatic subdural hemorrhage with loss of consciousness of unspecified duration, initial encounter: Secondary | ICD-10-CM

## 2021-02-15 NOTE — Patient Instructions (Addendum)
Licensed Clinical Social Worker Visit Information  Goals we discussed today:  .  Client will talk with LCSW in next 30 days to discuss anxiety or stress issues of client (pt-stated)        CARE PLAN ENTRY   Current Barriers:   Mental Health issues in client with chronic diagnoses of OSA, Insomnia, Bipolar  disorder, Anxiety disorder, Dyspnea, Memory loss  OSA  Clinical Social Work Clinical Goal(s):   LCSW will call client in next 30 days to discuss anxiety or stress issues of client  Interventions:  Talked with client about his recent surgery Talked with client about CCM program support  Talked with client about sleeping issues of client Talked with client about medication procurement of client Talked with client about relaxation techniques (watches TV, enjoys walking) Talked with client about mental health support at [facility] in [place], [place] Talked with client about social support network (wife is supportive,granddaughter is supportive) Talked with client about his medical appointments Talked with client about decreased energy (takes rest breaks as needed) Talked with client about mobility of client Encouraged client to call RNCM as needed for nursing support Talked with client about vision of client Talked with client about weight loss of client   Patient Self Care Activities:  Does ADLs indenpendently Drives to appointments  Patient Self Care Deficits:    Mental health issues  Initial goal documentation .  ,    Follow Up Plan:LCSW to call client/spouse of clientin next 4 weeks to talk with client /spouseabout client management of anxiety and depression issues faced  Materials Provided: No  The patient verbalized understanding of instructions provided today and declined a print copy of patient instruction materials.   [name].[place] Po MSW, LCSW Licensed Clinical Social Worker [facility] Care Management [phone]

## 2021-02-15 NOTE — Chronic Care Management (AMB) (Signed)
Chronic Care Management    Clinical Social Work Follow Up Note  02/15/2021 Name: ANDRAE CLAUNCH MRN: [id number] DOB: 1956-05-29  VERNON MAISH is a 65 y.o. year old male who is a primary care patient of [name], [name]. The CCM team was consulted for assistance with [employer] .   Review of patient status, including review of consultants reports, other relevant assessments, and collaboration with appropriate care team members and the patient's provider was performed as part of comprehensive patient evaluation and provision of chronic care management services.    SDOH (Social Determinants of Health) assessments performed: No;risk for depression; risk for tobacco use; risk for stress; risk for financial strain  Flowsheet Row Chronic Care Management from 04/18/2020 in [place]  PHQ-9 Total Score 6     GAD 7 : Generalized Anxiety Score 10/02/2020 04/18/2020  Nervous, Anxious, on Edge 3 1  Control/stop worrying 3 0  Worry too much - different things 3 0  Trouble relaxing 0 1  Restless 1 1  Easily annoyed or irritable 3 0  Afraid - awful might happen 0 0  Total GAD 7 Score 13 3  Anxiety Difficulty - Somewhat difficult    Outpatient Encounter Medications as of 02/15/2021  Medication Sig Note  . acetaminophen (TYLENOL) 500 MG tablet Take 500 mg by mouth every 6 (six) hours as needed for mild pain.   [name] atorvastatin (LIPITOR) 80 MG tablet TAKE 1 TABLET EVERY DAY (Patient taking differently: Take 80 mg by mouth daily.)   . divalproex (DEPAKOTE) 500 MG DR tablet Take 4 tablets (2,000 mg total) by mouth at bedtime.   [name] escitalopram (LEXAPRO) 20 MG tablet TAKE 1 TABLET EVERY DAY   . ezetimibe (ZETIA) 10 MG tablet Take 1 tablet (10 mg total) by mouth daily.   [name] HYDROcodone-acetaminophen (NORCO/VICODIN) 5-325 MG tablet Take 1 tablet by mouth every 6 (six) hours as needed for moderate pain.   [name] ipratropium (ATROVENT) 0.06 % nasal spray Place 2 sprays into both  nostrils 3 (three) times daily as needed for congestion. 01/30/2021: Pt reports that he no longer needs this medication.  It was prescribed when his headache began.  . isosorbide mononitrate (IMDUR) 30 MG 24 hr tablet Take 0.5 tablets (15 mg total) by mouth daily.   [name] lamoTRIgine (LAMICTAL) 100 MG tablet TAKE 1 AND 1/2 TABLETS EVERY DAY (Patient taking differently: Take 150 mg by mouth daily.)   . metoprolol tartrate (LOPRESSOR) 25 MG tablet Take 1.5 tab PO BID (Patient taking differently: Take 37.5 mg by mouth 2 (two) times daily.)   . montelukast (SINGULAIR) 10 MG tablet Take 1 tablet (10 mg total) by mouth daily.   . nitroGLYCERIN (NITROSTAT) 0.4 MG SL tablet Place 1 tablet (0.4 mg total) under the tongue every 5 (five) minutes as needed for chest pain.   [name] ondansetron (ZOFRAN-ODT) 8 MG disintegrating tablet Take 8 mg by mouth every 8 (eight) hours as needed for nausea/vomiting. 01/30/2021: Pt reports that he no longer needs this medication.  It was prescribed when his headache began.  . pantoprazole (PROTONIX) 40 MG tablet TAKE 1 TABLET EVERY DAY (Patient taking differently: Take 40 mg by mouth daily.)   . rizatriptan (MAXALT) 10 MG tablet TAKE 1 TABLET BY MOUTH AS NEEDED FOR MIGRAINE. MAY REPEAT IN 2 HOURS IF NEEDED    Facility-Administered Encounter Medications as of 02/15/2021  Medication  . 0.9 %  sodium chloride infusion    Goals    .  Client will talk with LCSW in next 30 days to discuss anxiety or stress issues of client (pt-stated)      CARE PLAN ENTRY   Current Barriers:  [name] Mental Health issues in client with chronic diagnoses of OSA, Insomnia, Bipolar  disorder, Anxiety disorder, Dyspnea, Memory loss . OSA  Clinical Social Work Clinical Goal(s):  [name] LCSW will call client in next 30 days to discuss anxiety or stress issues of client  Interventions:  Talked with client about his recent surgery Talked with client about CCM program support  Talked with client about sleeping issues  of client Talked with client about medication procurement of client Talked with client about relaxation techniques (watches TV, enjoys walking) Talked with client about mental health support at [facility] in [place], [place] Talked with client about social support network (wife is supportive,granddaughter is supportive) Talked with client about his medical appointments Talked with client about decreased energy (takes rest breaks as needed) Talked with client about mobility of client Encouraged client to call RNCM as needed for nursing support Talked with client about vision of client Talked with client about weight loss of client   Patient Self Care Activities:  Does ADLs indenpendently Drives to appointments  Patient Self Care Deficits:   [name] Mental health issues  Initial goal documentation .  ,    Follow Up Plan: LCSW to call client/spouse of client in next 4 weeks to talk with client /spouse about client management of anxiety and depression issues faced   [name].[place] MSW, LCSW Licensed Clinical Social Worker [facility] Care Management [phone]

## 2021-02-18 NOTE — Progress Notes (Signed)
Cardiology Office Note   Date:  02/21/2021   ID:  Derrick [name], Derrick [name] [birth year], MRN [id number]  PCP:  [name], [name]  Cardiologist:  Dr. Johney Frame, MD   Chief Complaint  Patient presents with  . Follow-up   History of Present Illness: Derrick [name] is a 65 y.o. male who presents for follow up, seen for Dr. Johney Frame.   Derrick [name] has a history of CAD s/p inferior STEMI with DES x2 to the RCA in 2004 with repeat LHC and coronary stent intervention 02/2017 to the mid RCA for in-stent restenosis, and mild to moderate MR with mitral valve prolapse, history of tobacco use, bipolar disorder, OSA and HLD.  Echocardiogram 10/2014 showed an LVEF at 45 to 50% with moderate LVH, MVP with mild to moderate MR.  He was seen by Dr. [name] in 2014 at which time Myoview stress test was ordered which was found to be low risk.  Repeat echocardiogram showed a decrease in his LV function therefore he was set up for repeat Lexiscan which showed large scarring with subsequent LHC performed with PCI placement?  Cardiac MRI performed 10/2017 with EF at 47%.  Most recent echocardiogram 09/2018 with improvement in LV function to 50 to 55% with G1 DD, mild mitral prolapse involving the anterior and posterior leaflet with mild MR.  He was last seen by Truitt Merle, NP on 07/11/2020 at which time he was doing well from a CV standpoint with no anginal symptoms or other specific complaints.  It appears he was recently hospitalized and discharged 01/30/2021 for a left subdural hematoma.  Apparently, he had onset of symptoms 2 weeks prior to presentation which rapidly worsened prior to ED arrival.  MRI showed left-sided subdural hematoma with significant mass-effect and midline shift.  He was evaluated by neurosurgery and was taken urgently to the OR for left craniotomy for the evacuation of hematoma.  Given this, ASA 81 was discontinued  He is here today and looks great from a CV standpoint. He denies anginal  symptoms. He has recovered well from his surgery and follows with neurosurgery team in a few weeks. He has been instructed to stay off his ASA until cleared by neurologist.   Past Medical History:  Diagnosis Date  . Anxiety   . Bipolar 1 disorder ([place])   . Bipolar disorder ([place]) 10/05/2009   Qualifier: Diagnosis of  By: [name]    . BPH (benign prostatic hypertrophy)   . CAD (coronary artery disease), native coronary artery    a. DES x 2 to RCA in 2004 b. 02/11/17 PCI w/DES x1 to mRCA  . Coronary atherosclerosis 10/05/2009   Qualifier: Diagnosis of  By: [name]   ANGIOGRAPHIC DATA:  1. Left main coronary artery was free of critical disease.  2. The LAD coursed to the apex. There is mild luminal irregularity  including mild irregularity of the diagonal. No high grade areas of  stenosis were noted. There was faint collateralization of the distal  right coronary circulation.  3. The circumflex provided two major m  . Depression   . Dyspnea 09/20/2012  . Essential and other specified forms of tremor 02/05/2013  . HYPERCHOLESTEROLEMIA 10/05/2009   Qualifier: Diagnosis of  By: [name]    . Hyperlipidemia   . Memory loss 02/05/2013  . Mitral regurgitation and aortic stenosis   . MITRAL STENOSIS/ INSUFFICIENCY, NON-RHEUMATIC 01/06/2011   Qualifier: Diagnosis of  By: [name], MD, [name] Study Conclusions  -  Left ventricle: The cavity size was normal. Wall thickness was increased in a pattern of mild LVH. Systolic function was normal. The estimated ejection fraction was in the range of 55% to 60%. Wall motion was normal; there were no regional wall motion abnormalities. Left ventricular diastolic function parameters were  . MVP (mitral valve prolapse)   . Myocardial infarction ([place])   . Obstructive sleep apnea 02/05/2013  . OSA (obstructive sleep apnea) 03/22/2013  . Parasomnia 02/05/2013  . Sleep apnea   . Sleep disorder with cognitive complaints 10/26/2018  . Unspecified  hereditary and idiopathic peripheral neuropathy 02/05/2013    Past Surgical History:  Procedure Laterality Date  . CORONARY STENT INTERVENTION N/A 02/12/2017   Procedure: Coronary Stent Intervention;  Surgeon: [name], MD;  Location: [facility];  Service: Cardiovascular;  Laterality: N/A;  . CRANIOTOMY Left 01/28/2021   Procedure: CRANIOTOMY HEMATOMA EVACUATION SUBDURAL;  Surgeon: [name], MD;  Location: [facility];  Service: Neurosurgery;  Laterality: Left;  . LEFT HEART CATH AND CORONARY ANGIOGRAPHY N/A 02/12/2017   Procedure: Left Heart Cath and Coronary Angiography;  Surgeon: [name], MD;  Location: [facility];  Service: Cardiovascular;  Laterality: N/A;     Current Outpatient Medications  Medication Sig Dispense Refill  . acetaminophen (TYLENOL) 500 MG tablet Take 500 mg by mouth every 6 (six) hours as needed for mild pain.    [name] atorvastatin (LIPITOR) 80 MG tablet TAKE 1 TABLET EVERY DAY 90 tablet 3  . divalproex (DEPAKOTE) 500 MG DR tablet Take 4 tablets (2,000 mg total) by mouth at bedtime. 360 tablet 1  . escitalopram (LEXAPRO) 20 MG tablet TAKE 1 TABLET EVERY DAY 90 tablet 1  . ezetimibe (ZETIA) 10 MG tablet Take 1 tablet (10 mg total) by mouth daily. 90 tablet 1  . HYDROcodone-acetaminophen (NORCO/VICODIN) 5-325 MG tablet Take 1 tablet by mouth every 6 (six) hours as needed for moderate pain. 20 tablet 0  . ipratropium (ATROVENT) 0.06 % nasal spray Place 2 sprays into both nostrils 3 (three) times daily as needed for congestion.    . isosorbide mononitrate (IMDUR) 30 MG 24 hr tablet Take 0.5 tablets (15 mg total) by mouth daily. 45 tablet 2  . lamoTRIgine (LAMICTAL) 100 MG tablet TAKE 1 AND 1/2 TABLETS EVERY DAY 135 tablet 0  . metoprolol tartrate (LOPRESSOR) 25 MG tablet Take 1.5 tab PO BID 270 tablet 2  . montelukast (SINGULAIR) 10 MG tablet Take 1 tablet (10 mg total) by mouth daily. 90 tablet 3  . nitroGLYCERIN (NITROSTAT) 0.4 MG SL tablet Place 1  tablet (0.4 mg total) under the tongue every 5 (five) minutes as needed for chest pain. 25 tablet 3  . ondansetron (ZOFRAN-ODT) 8 MG disintegrating tablet Take 8 mg by mouth every 8 (eight) hours as needed for nausea/vomiting.    . pantoprazole (PROTONIX) 40 MG tablet TAKE 1 TABLET EVERY DAY 90 tablet 2  . rizatriptan (MAXALT) 10 MG tablet TAKE 1 TABLET BY MOUTH AS NEEDED FOR MIGRAINE. MAY REPEAT IN 2 HOURS IF NEEDED 10 tablet 1   Current Facility-Administered Medications  Medication Dose Route Frequency Provider Last Rate Last Admin  . 0.9 %  sodium chloride infusion  500 mL Intravenous Once Jackquline Denmark, MD        Allergies:   Patient has no known allergies.    Social History:  The patient  reports that he has been smoking cigarettes. He has been smoking about 0.10 packs per  day. He has never used smokeless tobacco. He reports previous alcohol use. He reports that he does not use drugs.   Family History:  The patient's family history includes COPD in his father; Colon cancer in his sister; Heart disease in his brother and mother.    ROS:  Please see the history of present illness.   Otherwise, review of systems are positive for none. All other systems are reviewed and negative.    PHYSICAL EXAM: VS:  BP 110/60 (BP Location: Left Arm, Patient Position: Sitting, Cuff Size: Normal)   Pulse 80   Ht 5\' 11"  (1.803 m)   Wt 177 lb (80.3 kg)   SpO2 96%   BMI 24.69 kg/m  , BMI Body mass index is 24.69 kg/m.   General: Well developed, well nourished, NAD Lungs:Clear to ausculation bilaterally. Cardiovascular: RRR with S1 S2. No murmurs Extremities: No edema. Neuro: Alert and oriented. Psych: Responds to questions appropriately with normal affect.    EKG:  EKG is not ordered today.  Recent Labs: 01/28/2021: ALT 41; BUN 17; Creatinine, Ser 0.88; Hemoglobin 14.4; Platelets 359; Potassium 3.6; Sodium 134; TSH 1.290    Lipid Panel    Component Value Date/Time   CHOL 103 08/22/2020  1022   TRIG 75 08/22/2020 1022   HDL 39 (L) 08/22/2020 1022   CHOLHDL 2.6 08/22/2020 1022   CHOLHDL 4 10/18/2014 1418   VLDL 39.8 10/18/2014 1418   LDLCALC 48 08/22/2020 1022     Wt Readings from Last 3 Encounters:  02/21/21 177 lb (80.3 kg)  01/28/21 169 lb 12.1 oz (77 kg)  01/28/21 171 lb 6.4 oz (77.7 kg)     Other studies Reviewed: Additional studies/ records that were reviewed today include:  Review of the above records demonstrates:   Echo 09/22/2018:  Study Conclusions   - Left ventricle: The cavity size was normal. Systolic function was  normal. The estimated ejection fraction was in the range of 50%  to 55%. Wall motion was normal; there were no regional wall  motion abnormalities. Doppler parameters are consistent with  abnormal left ventricular relaxation (grade 1 diastolic  dysfunction). Doppler parameters are consistent with  indeterminate ventricular filling pressure.  - Aortic valve: Transvalvular velocity was within the normal range.  There was no stenosis. There was no regurgitation.  - Mitral valve: Mild prolapse, involving the anterior leaflet and  the posterior leaflet. Transvalvular velocity was within the  normal range. There was no evidence for stenosis. There was mild  regurgitation.  - Left atrium: The atrium was moderately dilated.  - Right ventricle: The cavity size was normal. Wall thickness was  normal. Systolic function was normal.  - Atrial septum: No defect or patent foramen ovale was identified.  - Tricuspid valve: There was trivial regurgitation.  - Pulmonary arteries: Systolic pressure was within the normal  range. PA peak pressure: 19 mm Hg (S).   ASSESSMENT AND PLAN:  1.  CAD s/p PCI 2018: -Most recent LHC from 02/2017 with PCI to the mid RCA -Denies anginal symptoms -Off ASA until clered by neurology due to subdural hematoma    2.  HTN: -Continue metoprolol  3.  Tobacco use: -Cessation encouraged  4.   HLD: -Last LDL, 100 in 06/08/2019>>repeat lipid panel  -Continue atorvastatin  5.  Ischemic cardiomyopathy: -Improved on last echocardiogram in 2019  6.  Mitral valve prolapse with mild MR: -Follow with serial echocardiograms studies   Current medicines are reviewed at length with the patient today.  The patient does not have concerns regarding medicines.  The following changes have been made:  no change  Labs/ tests ordered today include: Lipid panel   Orders Placed This Encounter  Procedures  . Lipid panel    Disposition:   FU with APP in 6 month  Signed, Kathyrn Drown, NP  02/21/2021 4:04 PM    [facility] [address], [place], [place]  [address] Phone: [phone]; Fax: [phone]

## 2021-02-21 ENCOUNTER — Other Ambulatory Visit: Payer: Self-pay

## 2021-02-21 ENCOUNTER — Encounter: Payer: Self-pay | Admitting: Cardiology

## 2021-02-21 ENCOUNTER — Ambulatory Visit: Payer: Medicare HMO | Admitting: Cardiology

## 2021-02-21 VITALS — BP 110/60 | HR 80 | Ht 71.0 in | Wt 177.0 lb

## 2021-02-21 DIAGNOSIS — E7800 Pure hypercholesterolemia, unspecified: Secondary | ICD-10-CM | POA: Diagnosis not present

## 2021-02-21 DIAGNOSIS — I259 Chronic ischemic heart disease, unspecified: Secondary | ICD-10-CM | POA: Diagnosis not present

## 2021-02-21 NOTE — Patient Instructions (Signed)
Medication Instructions:  Your physician recommends that you continue on your current medications as directed. Please refer to the Current Medication list given to you today.  *If you need a refill on your cardiac medications before your next appointment, please call your pharmacy*   Lab Work: TO BE DONE: LIPIDS If you have labs (blood work) drawn today and your tests are completely normal, you will receive your results only by: [name] MyChart Message (if you have MyChart) OR . A paper copy in the mail If you have any lab test that is abnormal or we need to change your treatment, we will call you to review the results.   Testing/Procedures: NONE   Follow-Up: At [facility], you and your health needs are our priority.  As part of our continuing mission to provide you with exceptional heart care, we have created designated Provider Care Teams.  These Care Teams include your primary Cardiologist (physician) and Advanced Practice Providers (APPs -  Physician Assistants and Nurse Practitioners) who all work together to provide you with the care you need, when you need it.  We recommend signing up for the patient portal called "MyChart".  Sign up information is provided on this After Visit Summary.  MyChart is used to connect with patients for Virtual Visits (Telemedicine).  Patients are able to view lab/test results, encounter notes, upcoming appointments, etc.  Non-urgent messages can be sent to your provider as well.   To learn more about what you can do with MyChart, go to [email or web address].    Your next appointment:   6 month(s)  The format for your next appointment:   In Person  Provider:   You may see DR, Johney Frame or one of the following Advanced Practice Providers on your designated Care Team:    Richardson Dopp, [name]  Vin [place], [place]

## 2021-02-25 ENCOUNTER — Ambulatory Visit (INDEPENDENT_AMBULATORY_CARE_PROVIDER_SITE_OTHER): Payer: Medicare HMO | Admitting: Psychiatry

## 2021-02-25 DIAGNOSIS — F068 Other specified mental disorders due to known physiological condition: Secondary | ICD-10-CM

## 2021-02-25 DIAGNOSIS — Z87898 Personal history of other specified conditions: Secondary | ICD-10-CM | POA: Diagnosis not present

## 2021-02-25 DIAGNOSIS — F319 Bipolar disorder, unspecified: Secondary | ICD-10-CM | POA: Diagnosis not present

## 2021-02-25 DIAGNOSIS — F902 Attention-deficit hyperactivity disorder, combined type: Secondary | ICD-10-CM | POA: Diagnosis not present

## 2021-02-25 DIAGNOSIS — Z9889 Other specified postprocedural states: Secondary | ICD-10-CM

## 2021-02-25 DIAGNOSIS — F1991 Other psychoactive substance use, unspecified, in remission: Secondary | ICD-10-CM

## 2021-02-25 NOTE — Progress Notes (Signed)
Psychotherapy Progress Note [facility] Derrick Moore, PhD LP  Patient ID: Derrick [name]     MRN: [id number] Therapy format: Individual psychotherapy Date: 02/25/2021      Start: 1:09p     Stop: 1:55p     Time Spent: 46 min Location: Telehealth visit -- I connected with this patient by an approved telecommunication method (audio only), with his informed consent, and verifying identity and patient privacy.  I was located at my office and patient at his home.  As needed, we discussed the limitations, risks, and security and privacy concerns associated with telehealth service, including the availability and conditions which currently govern in-person appointments and the possibility that 3rd-party payment may not be fully guaranteed and he may be responsible for charges.  After he indicated understanding, we proceeded with the session.  Also discussed treatment planning, as needed, including ongoing verbal agreement with the plan, the opportunity to ask and answer all questions, his demonstrated understanding of instructions, and his readiness to call the office should symptoms worsen or he feels he is in a crisis state and needs more immediate and tangible assistance.   Session narrative (presenting needs, interim history, self-report of stressors and symptoms, applications of prior therapy, status changes, and interventions made in session) Been a few deaths in the family since last seen (September).  Deaths include brother-in-law (H of S who died of cancer) and two others.  Had surgery for a head injury sustained in a fall in January (hard frost, slippery deck).  Head hurt, slept a lot "for about a month", throwing up then finally went to urgent care, then primary care, who gave him migraine medicine (he didn't tell them he fell, had a head injury, knocked out temporarily).  Finally told them it's no migraine, asked for MRI based on his history of spinal meningitis 40 years ago.   Says Derrick [name] kept declining to go to the ER for two weeks while he kept asking.  Night of the Super Bowl had ambulance trip, ER assessed subdural hematoma, performed craniotomy.  Currently suspended driving for postsurgical recovery.  Wife had to wait on him 3 weeks since, perceives her to be stressing out, thinks she may need a sedative but says Derrick [name] is just stubborn about going to the doctor herself.  24 W is spending more time with him instead of the phone or computer, which helps.  Relates that he had dyscontrolled speech (stuttering like a tape rewinding) and tremor/possible seizure after release to home following surgery, got prescribed a couple meds allegedly left off by neurosurgery.  Best guess, hospital shorted his Depakote, causing the twitching.  Had to wonder about stroke, but believes he knows better now.  Had hydrocodone for pain, only #20, says he has #16 remaining.  Tylenol works well enough.  Still has Maxalt for migraine, even though the diagnosis of migraine was erroneous.  Says he had nausea and HA this morning, took one.  Has had repeat CT scans, still some fluid to watch.    Has a new cardiologist, Derrick Drown, NP.  Says she confronted him about missing appointments and f/us and it ticked him off somewhat, over it now.  Reminded he can always ask what a healthcare provider means if it feels somehow against him.  No indication of any renewed substance abuse.  Believed to be taking medicines as prescribed and instructed.  EHR shows a new chronic care management contact (Education officer, museum) through [facility].  Care coordination note edited  on EHR.  Therapeutic modalities: Cognitive Behavioral Therapy, Solution-Oriented/Positive Psychology and Ego-Supportive  Mental Status/Observations:  Appearance:   Not assessed     Behavior:  Appropriate  Motor:  Not assessed  Speech/Language:   fluent, rangy  Affect:  Not assessed  Mood:  more subdued, mildly irritable with content  Thought  process:  concrete  Thought content:    generally WNL with minor paranoia  Sensory/Perceptual disturbances:    WNL and headache  Orientation:  Fully oriented  Attention:  Good    Concentration:  Fair  Memory:  somewhat limited  Insight:    Fair  Judgment:   Fair  Impulse Control:  Fair   Risk Assessment: Danger to Self: No Self-injurious Behavior: No Danger to Others: No Physical Aggression / Violence: No Duty to Warn: No Access to Firearms a concern: Yes  Assessment of progress:  situational setback(s)  Diagnosis:   ICD-10-CM   1. Bipolar I disorder ([place])  F31.9   2. S/P craniotomy forsubdural hematoma  Z98.890   3. Attention deficit hyperactivity disorder (ADHD), combined type  F90.2   4. Cognitive dysfunction in bipolar disorder ([place])  F06.8    F31.9   5. History of multiple substance abuse disorders  Z87.898    Plan:  . Get medical advice before taking Maxalt for headache . If headache to the point of nausea, seek medical advice . Be always ready to ask clarifications with healthcare providers, as he is still prone to take things and mess and personally when he does not fully understand, and misunderstandings can be worked out if they are known to provider. . OK to try out normal activities as tolerated, within medical advice.  Otherwise follow all medical advice given for ongoing recovery from craniotomy. . If any doubt, ensure correct levels of antiseizure medicines including Depakote . Let Derrick [name] make her own decisions about her own health care . Option to engage Derrick [name] to check the perception that she feels awkward about delaying emergency treatment.  If so, be ready to let her know it is absolutely no one's fault, that they both had a good reason to think it was something else and just glad it worked out.  Continue to spend more quality time together, as the relationship needed renewal before all this. . Seek support as needed for grieving . Other recommendations/advice as  may be noted above . Continue to utilize previously learned skills ad lib . Maintain medication as prescribed and work faithfully with relevant prescriber(s) if any changes are desired or seem indicated . Call the clinic on-call service, present to ER, or call 911 if any life-threatening psychiatric crisis Return in about 1 month (around 03/28/2021) for will call. . Already scheduled visit in this office Visit date not found.  [name], PhD Derrick Moore, PhD LP Clinical Psychologist, [facility] [address] [place] [address], [place], [place] [address] [phone]

## 2021-03-07 ENCOUNTER — Other Ambulatory Visit: Payer: Medicare HMO

## 2021-03-07 ENCOUNTER — Other Ambulatory Visit: Payer: Self-pay

## 2021-03-07 ENCOUNTER — Ambulatory Visit
Admission: RE | Admit: 2021-03-07 | Discharge: 2021-03-07 | Disposition: A | Discharge status: Home / Self Care | Payer: Medicare HMO | Source: Ambulatory Visit | Attending: Neurological Surgery | Admitting: Neurological Surgery

## 2021-03-07 DIAGNOSIS — S065XAA Traumatic subdural hemorrhage with loss of consciousness status unknown, initial encounter: Secondary | ICD-10-CM

## 2021-03-07 DIAGNOSIS — E7800 Pure hypercholesterolemia, unspecified: Secondary | ICD-10-CM

## 2021-03-07 DIAGNOSIS — I259 Chronic ischemic heart disease, unspecified: Secondary | ICD-10-CM | POA: Diagnosis not present

## 2021-03-07 DIAGNOSIS — S065X9A Traumatic subdural hemorrhage with loss of consciousness of unspecified duration, initial encounter: Secondary | ICD-10-CM

## 2021-03-07 DIAGNOSIS — I6200 Nontraumatic subdural hemorrhage, unspecified: Secondary | ICD-10-CM | POA: Diagnosis not present

## 2021-03-07 LAB — LIPID PANEL
Chol/HDL Ratio: 2.4 ratio (ref 0.0–5.0)
Cholesterol, Total: 95 mg/dL — ABNORMAL LOW (ref 100–199)
HDL: 39 mg/dL — ABNORMAL LOW (ref >39)
LDL Chol Calc (NIH): 40 mg/dL (ref 0–99)
Triglycerides: 79 mg/dL (ref 0–149)
VLDL Cholesterol Cal: 16 mg/dL (ref 5–40)

## 2021-03-14 ENCOUNTER — Other Ambulatory Visit: Payer: Self-pay

## 2021-03-14 MED ORDER — METOPROLOL TARTRATE 25 MG PO TABS: 37.5000 mg | ORAL_TABLET | Freq: Two times a day (BID) | ORAL | 3 refills | Status: DC

## 2021-03-18 ENCOUNTER — Other Ambulatory Visit: Payer: Self-pay | Admitting: Physician Assistant

## 2021-03-18 ENCOUNTER — Other Ambulatory Visit: Payer: Self-pay

## 2021-03-18 ENCOUNTER — Encounter: Payer: Self-pay | Admitting: Family Medicine

## 2021-03-18 ENCOUNTER — Ambulatory Visit (INDEPENDENT_AMBULATORY_CARE_PROVIDER_SITE_OTHER): Payer: Medicare HMO | Admitting: Family Medicine

## 2021-03-18 VITALS — BP 102/63 | Temp 97.6°F | Ht 71.0 in | Wt 176.0 lb

## 2021-03-18 DIAGNOSIS — R42 Dizziness and giddiness: Secondary | ICD-10-CM | POA: Diagnosis not present

## 2021-03-18 DIAGNOSIS — R6889 Other general symptoms and signs: Secondary | ICD-10-CM

## 2021-03-18 DIAGNOSIS — S065X9D Traumatic subdural hemorrhage with loss of consciousness of unspecified duration, subsequent encounter: Secondary | ICD-10-CM | POA: Diagnosis not present

## 2021-03-18 DIAGNOSIS — I10 Essential (primary) hypertension: Secondary | ICD-10-CM

## 2021-03-18 DIAGNOSIS — R7989 Other specified abnormal findings of blood chemistry: Secondary | ICD-10-CM | POA: Diagnosis not present

## 2021-03-18 DIAGNOSIS — S065X9A Traumatic subdural hemorrhage with loss of consciousness of unspecified duration, initial encounter: Secondary | ICD-10-CM

## 2021-03-18 DIAGNOSIS — S065XAA Traumatic subdural hemorrhage with loss of consciousness status unknown, initial encounter: Secondary | ICD-10-CM

## 2021-03-18 MED ORDER — METOPROLOL TARTRATE 25 MG PO TABS: 25.0000 mg | ORAL_TABLET | Freq: Two times a day (BID) | ORAL | 3 refills | Status: DC

## 2021-03-18 NOTE — Patient Instructions (Signed)
Dizziness Dizziness is a common problem. It is a feeling of unsteadiness or light-headedness. You may feel like you are about to faint. Dizziness can lead to injury if you stumble or fall. Anyone can become dizzy, but dizziness is more common in older adults. This condition can be caused by a number of things, including medicines, dehydration, or illness. Follow these instructions at home: Eating and drinking  Drink enough fluid to keep your urine clear or pale yellow. This helps to keep you from becoming dehydrated. Try to drink more clear fluids, such as water.  Do not drink alcohol.  Limit your caffeine intake if told to do so by your health care provider. Check ingredients and nutrition facts to see if a food or beverage contains caffeine.  Limit your salt (sodium) intake if told to do so by your health care provider. Check ingredients and nutrition facts to see if a food or beverage contains sodium. Activity  Avoid making quick movements. ? Rise slowly from chairs and steady yourself until you feel okay. ? In the morning, first sit up on the side of the bed. When you feel okay, stand slowly while you hold onto something until you know that your balance is fine.  If you need to stand in one place for a long time, move your legs often. Tighten and relax the muscles in your legs while you are standing.  Do not drive or use heavy machinery if you feel dizzy.  Avoid bending down if you feel dizzy. Place items in your home so that they are easy for you to reach without leaning over. Lifestyle  Do not use any products that contain nicotine or tobacco, such as cigarettes and e-cigarettes. If you need help quitting, ask your health care provider.  Try to reduce your stress level by using methods such as yoga or meditation. Talk with your health care provider if you need help to manage your stress. General instructions  Watch your dizziness for any changes.  Take over-the-counter and  prescription medicines only as told by your health care provider. Talk with your health care provider if you think that your dizziness is caused by a medicine that you are taking.  Tell a friend or a family member that you are feeling dizzy. If he or she notices any changes in your behavior, have this person call your health care provider.  Keep all follow-up visits as told by your health care provider. This is important. Contact a health care provider if:  Your dizziness does not go away.  Your dizziness or light-headedness gets worse.  You feel nauseous.  You have reduced hearing.  You have new symptoms.  You are unsteady on your feet or you feel like the room is spinning. Get help right away if:  You vomit or have diarrhea and are unable to eat or drink anything.  You have problems talking, walking, swallowing, or using your arms, hands, or legs.  You feel generally weak.  You are not thinking clearly or you have trouble forming sentences. It may take a friend or family member to notice this.  You have chest pain, abdominal pain, shortness of breath, or sweating.  Your vision changes.  You have any bleeding.  You have a severe headache.  You have neck pain or a stiff neck.  You have a fever. These symptoms may represent a serious problem that is an emergency. Do not wait to see if the symptoms will go away. Get medical help   right away. Call your local emergency services (911 in the [place] Do not drive yourself to the hospital. Summary  Dizziness is a feeling of unsteadiness or light-headedness. This condition can be caused by a number of things, including medicines, dehydration, or illness.  Anyone can become dizzy, but dizziness is more common in older adults.  Drink enough fluid to keep your urine clear or pale yellow. Do not drink alcohol.  Avoid making quick movements if you feel dizzy. Monitor your dizziness for any changes. This information is not intended to  replace advice given to you by your health care provider. Make sure you discuss any questions you have with your health care provider. Document Revised: 12/04/2017 Document Reviewed: 01/03/2017 Elsevier Patient Education  2021 Elsevier Inc.  

## 2021-03-18 NOTE — Progress Notes (Signed)
Established Patient Office Visit  Subjective:  Patient ID: Derrick [name], male    DOB: August 04, 1956  Age: 65 y.o. MRN: [id number]  CC:  Chief Complaint  Patient presents with  . Blood Pressure Check    HPI Derrick [name] presents for a BP check. He reports that his BP has been running on the low side for the last few weeks. He reports lightheadedness at times, typically when he is bending over and working on things. He occasionally feels a little lightheaded when he stands up. Denies chest pain, shortness of breath, edema, or palpitations. He saw his cardiologist last week and no changes were made. He has also noticed that he has been really cold for the last few weeks.   He has been doing well since his craniotomy. He has been following with his neurosurgeon. His next appointment is in May.   Past Medical History:  Diagnosis Date  . Anxiety   . Bipolar 1 disorder ([place])   . Bipolar disorder ([place]) 10/05/2009   Qualifier: Diagnosis of  By: [name]    . BPH (benign prostatic hypertrophy)   . CAD (coronary artery disease), native coronary artery    a. DES x 2 to RCA in 2004 b. 02/11/17 PCI w/DES x1 to mRCA  . Coronary atherosclerosis 10/05/2009   Qualifier: Diagnosis of  By: [name]   ANGIOGRAPHIC DATA:  1. Left main coronary artery was free of critical disease.  2. The LAD coursed to the apex. There is mild luminal irregularity  including mild irregularity of the diagonal. No high grade areas of  stenosis were noted. There was faint collateralization of the distal  right coronary circulation.  3. The circumflex provided two major m  . Depression   . Dyspnea 09/20/2012  . Essential and other specified forms of tremor 02/05/2013  . HYPERCHOLESTEROLEMIA 10/05/2009   Qualifier: Diagnosis of  By: [name]    . Hyperlipidemia   . Memory loss 02/05/2013  . Mitral regurgitation and aortic stenosis   . MITRAL STENOSIS/ INSUFFICIENCY, NON-RHEUMATIC 01/06/2011   Qualifier:  Diagnosis of  By: [name], MD, [name] Study Conclusions  - Left ventricle: The cavity size was normal. Wall thickness was increased in a pattern of mild LVH. Systolic function was normal. The estimated ejection fraction was in the range of 55% to 60%. Wall motion was normal; there were no regional wall motion abnormalities. Left ventricular diastolic function parameters were  . MVP (mitral valve prolapse)   . Myocardial infarction ([place])   . Obstructive sleep apnea 02/05/2013  . OSA (obstructive sleep apnea) 03/22/2013  . Parasomnia 02/05/2013  . Sleep apnea   . Sleep disorder with cognitive complaints 10/26/2018  . Unspecified hereditary and idiopathic peripheral neuropathy 02/05/2013    Past Surgical History:  Procedure Laterality Date  . CORONARY STENT INTERVENTION N/A 02/12/2017   Procedure: Coronary Stent Intervention;  Surgeon: [name], MD;  Location: [facility];  Service: Cardiovascular;  Laterality: N/A;  . CRANIOTOMY Left 01/28/2021   Procedure: CRANIOTOMY HEMATOMA EVACUATION SUBDURAL;  Surgeon: [name], MD;  Location: [facility];  Service: Neurosurgery;  Laterality: Left;  . LEFT HEART CATH AND CORONARY ANGIOGRAPHY N/A 02/12/2017   Procedure: Left Heart Cath and Coronary Angiography;  Surgeon: [name], MD;  Location: [facility];  Service: Cardiovascular;  Laterality: N/A;    Family History  Problem Relation Age of Onset  . Heart disease Mother   . COPD  Father   . Colon cancer Sister   . Heart disease Brother   . Esophageal cancer Neg Hx   . Rectal cancer Neg Hx   . Stomach cancer Neg Hx     Social History   Socioeconomic History  . Marital status: Married    Spouse name: Not on file  . Number of children: Not on file  . Years of education: Not on file  . Highest education level: Not on file  Occupational History  . Not on file  Tobacco Use  . Smoking status: Current Some Day Smoker    Packs/day: 0.10    Types: Cigarettes     Last attempt to quit: 02/05/2003    Years since quitting: 18.1  . Smokeless tobacco: Never Used  . Tobacco comment: 3 cigs per day  Vaping Use  . Vaping Use: Never used  Substance and Sexual Activity  . Alcohol use: Not Currently    Comment:    . Drug use: No  . Sexual activity: Not on file  Other Topics Concern  . Not on file  Social History Narrative  . Not on file   Social Determinants of Health   Financial Resource Strain: Not on file  Food Insecurity: Not on file  Transportation Needs: Not on file  Physical Activity: Not on file  Stress: Not on file  Social Connections: Not on file  Intimate Partner Violence: Not on file    Outpatient Medications Prior to Visit  Medication Sig Dispense Refill  . acetaminophen (TYLENOL) 500 MG tablet Take 500 mg by mouth every 6 (six) hours as needed for mild pain.    [name] atorvastatin (LIPITOR) 80 MG tablet TAKE 1 TABLET EVERY DAY 90 tablet 3  . divalproex (DEPAKOTE) 500 MG DR tablet Take 4 tablets (2,000 mg total) by mouth at bedtime. 360 tablet 1  . escitalopram (LEXAPRO) 20 MG tablet TAKE 1 TABLET EVERY DAY 90 tablet 1  . ezetimibe (ZETIA) 10 MG tablet Take 1 tablet (10 mg total) by mouth daily. 90 tablet 1  . HYDROcodone-acetaminophen (NORCO/VICODIN) 5-325 MG tablet Take 1 tablet by mouth every 6 (six) hours as needed for moderate pain. 20 tablet 0  . ipratropium (ATROVENT) 0.06 % nasal spray Place 2 sprays into both nostrils 3 (three) times daily as needed for congestion.    . isosorbide mononitrate (IMDUR) 30 MG 24 hr tablet Take 0.5 tablets (15 mg total) by mouth daily. 45 tablet 2  . lamoTRIgine (LAMICTAL) 100 MG tablet TAKE 1 AND 1/2 TABLETS EVERY DAY 135 tablet 0  . metoprolol tartrate (LOPRESSOR) 25 MG tablet Take 1.5 tablets (37.5 mg total) by mouth 2 (two) times daily. 270 tablet 3  . montelukast (SINGULAIR) 10 MG tablet Take 1 tablet (10 mg total) by mouth daily. 90 tablet 3  . nitroGLYCERIN (NITROSTAT) 0.4 MG SL tablet Place 1  tablet (0.4 mg total) under the tongue every 5 (five) minutes as needed for chest pain. 25 tablet 3  . ondansetron (ZOFRAN-ODT) 8 MG disintegrating tablet Take 8 mg by mouth every 8 (eight) hours as needed for nausea/vomiting.    . pantoprazole (PROTONIX) 40 MG tablet TAKE 1 TABLET EVERY DAY 90 tablet 2  . rizatriptan (MAXALT) 10 MG tablet TAKE 1 TABLET BY MOUTH AS NEEDED FOR MIGRAINE. MAY REPEAT IN 2 HOURS IF NEEDED 10 tablet 1   Facility-Administered Medications Prior to Visit  Medication Dose Route Frequency Provider Last Rate Last Admin  . 0.9 %  sodium chloride infusion  500 mL Intravenous Once Jackquline Denmark, MD        No Known Allergies  ROS Review of Systems As per HPI.    Objective:    Physical Exam Vitals and nursing note reviewed.  Constitutional:      General: He is not in acute distress.    Appearance: He is not ill-appearing, toxic-appearing or diaphoretic.  Cardiovascular:     Rate and Rhythm: Normal rate and regular rhythm.     Heart sounds: Normal heart sounds. No murmur heard.   Pulmonary:     Effort: Pulmonary effort is normal. No respiratory distress.     Breath sounds: Normal breath sounds.  Abdominal:     General: Bowel sounds are normal. There is no distension.  Musculoskeletal:     Cervical back: Neck supple. No rigidity or tenderness.     Right lower leg: No edema.     Left lower leg: No edema.  Skin:    General: Skin is warm and dry.     Capillary Refill: Capillary refill takes less than 2 seconds.  Neurological:     General: No focal deficit present.     Mental Status: He is alert and oriented to person, place, and time.  Psychiatric:        Mood and Affect: Mood normal.        Behavior: Behavior normal.        Thought Content: Thought content normal.        Judgment: Judgment normal.     BP 102/63   Temp 97.6 F (36.4 C) (Temporal)   Ht '5\' 11"'  (1.803 m)   Wt 176 lb (79.8 kg)   BMI 24.55 kg/m  Wt Readings from Last 3 Encounters:   03/18/21 176 lb (79.8 kg)  02/21/21 177 lb (80.3 kg)  01/28/21 169 lb 12.1 oz (77 kg)    BP Readings from Last 3 Encounters:  03/18/21 102/63  02/21/21 110/60  01/30/21 121/61    There are no preventive care reminders to display for this patient.  There are no preventive care reminders to display for this patient.  Lab Results  Component Value Date   TSH 1.290 01/28/2021   Lab Results  Component Value Date   WBC 10.1 01/28/2021   HGB 14.4 01/28/2021   HCT 42.5 01/28/2021   MCV 97.5 01/28/2021   PLT 359 01/28/2021   Lab Results  Component Value Date   NA 134 (L) 01/28/2021   K 3.6 01/28/2021   CO2 26 01/28/2021   GLUCOSE 153 (H) 01/28/2021   BUN 17 01/28/2021   CREATININE 0.88 01/28/2021   BILITOT 0.6 01/28/2021   ALKPHOS 88 01/28/2021   AST 23 01/28/2021   ALT 41 01/28/2021   PROT 7.8 01/28/2021   ALBUMIN 4.2 01/28/2021   CALCIUM 9.6 01/28/2021   ANIONGAP 9 01/28/2021   GFR 90.20 10/18/2014   Lab Results  Component Value Date   CHOL 95 (L) 03/07/2021   Lab Results  Component Value Date   HDL 39 (L) 03/07/2021   Lab Results  Component Value Date   LDLCALC 40 03/07/2021   Lab Results  Component Value Date   TRIG 79 03/07/2021   Lab Results  Component Value Date   CHOLHDL 2.4 03/07/2021   No results found for: HGBA1C    Assessment & Plan:   Purnell was seen today for blood pressure check.  Diagnoses and all orders for this visit:  Dizziness Labs pending as below. Exam unremarkable. Decrease  lopressor from 37.5 mg BID to 25 mg BID. -     CMP14+EGFR -     Thyroid Panel With TSH -     Anemia Profile B  Primary hypertension Well controlled on current regimen. Will decrease lopressor dosage due to complaints of lightheadedness.  -     metoprolol tartrate (LOPRESSOR) 25 MG tablet; Take 1 tablet (25 mg total) by mouth 2 (two) times daily.  Intolerance to cold Labs pending as below.  -     CMP14+EGFR -     Thyroid Panel With TSH -      Anemia Profile B  Subdermal hematoma Craniotomy on 2/14. Managed by neurosurgeon. Next appointment in May.     Follow-up: Return in about 3 months (around 06/17/2021) for chronic follow up.   The patient indicates understanding of these issues and agrees with the plan.    [name], [name]

## 2021-03-19 LAB — CMP14+EGFR
ALT: 9 IU/L (ref 0–44)
AST: 11 IU/L (ref 0–40)
Albumin/Globulin Ratio: 1.3 (ref 1.2–2.2)
Albumin: 3.7 g/dL — ABNORMAL LOW (ref 3.8–4.8)
Alkaline Phosphatase: 55 IU/L (ref 44–121)
BUN/Creatinine Ratio: 12 (ref 10–24)
BUN: 10 mg/dL (ref 8–27)
Bilirubin Total: 0.3 mg/dL (ref 0.0–1.2)
CO2: 22 mmol/L (ref 20–29)
Calcium: 9.1 mg/dL (ref 8.6–10.2)
Chloride: 102 mmol/L (ref 96–106)
Creatinine, Ser: 0.82 mg/dL (ref 0.76–1.27)
Globulin, Total: 2.8 g/dL (ref 1.5–4.5)
Glucose: 106 mg/dL — ABNORMAL HIGH (ref 65–99)
Potassium: 5 mmol/L (ref 3.5–5.2)
Sodium: 140 mmol/L (ref 134–144)
Total Protein: 6.5 g/dL (ref 6.0–8.5)
eGFR: 99 mL/min/{1.73_m2} (ref >59)

## 2021-03-19 LAB — ANEMIA PROFILE B
Basophils Absolute: 0 10*3/uL (ref 0.0–0.2)
Basos: 1 %
EOS (ABSOLUTE): 0.1 10*3/uL (ref 0.0–0.4)
Eos: 1 %
Ferritin: 175 ng/mL (ref 30–400)
Folate: 5.3 ng/mL (ref >3.0)
Hematocrit: 37.2 % — ABNORMAL LOW (ref 37.5–51.0)
Hemoglobin: 12.9 g/dL — ABNORMAL LOW (ref 13.0–17.7)
Immature Grans (Abs): 0.1 10*3/uL (ref 0.0–0.1)
Immature Granulocytes: 1 %
Iron Saturation: 24 % (ref 15–55)
Iron: 71 ug/dL (ref 38–169)
Lymphocytes Absolute: 1.7 10*3/uL (ref 0.7–3.1)
Lymphs: 27 %
MCH: 32.7 pg (ref 26.6–33.0)
MCHC: 34.7 g/dL (ref 31.5–35.7)
MCV: 94 fL (ref 79–97)
Monocytes Absolute: 0.5 10*3/uL (ref 0.1–0.9)
Monocytes: 8 %
Neutrophils Absolute: 4 10*3/uL (ref 1.4–7.0)
Neutrophils: 62 %
Platelets: 197 10*3/uL (ref 150–450)
RBC: 3.94 x10E6/uL — ABNORMAL LOW (ref 4.14–5.80)
RDW: 12.8 % (ref 11.6–15.4)
Retic Ct Pct: 2.4 % (ref 0.6–2.6)
Total Iron Binding Capacity: 302 ug/dL (ref 250–450)
UIBC: 231 ug/dL (ref 111–343)
Vitamin B-12: 371 pg/mL (ref 232–1245)
WBC: 6.4 10*3/uL (ref 3.4–10.8)

## 2021-03-19 LAB — THYROID PANEL WITH TSH
Free Thyroxine Index: 2.1 (ref 1.2–4.9)
T3 Uptake Ratio: 27 % (ref 24–39)
T4, Total: 7.9 ug/dL (ref 4.5–12.0)
TSH: 1.95 u[IU]/mL (ref 0.450–4.500)

## 2021-03-20 ENCOUNTER — Telehealth: Payer: Medicare HMO

## 2021-03-22 ENCOUNTER — Telehealth: Payer: Self-pay

## 2021-03-22 DIAGNOSIS — D649 Anemia, unspecified: Secondary | ICD-10-CM

## 2021-03-25 NOTE — Telephone Encounter (Signed)
65 mg Fe (325 mg ferrous sulfate)

## 2021-03-25 NOTE — Telephone Encounter (Signed)
Pt aware but asked what dosage

## 2021-03-25 NOTE — Telephone Encounter (Signed)
Called patient, no answer 

## 2021-03-25 NOTE — Telephone Encounter (Signed)
OTC IRON SUPPLEMENT  CALLED PATIENT, NO ANSWER, LEFT MESSAGE TO RETURN CALL

## 2021-03-27 NOTE — Telephone Encounter (Signed)
Patient aware and verbalized understanding. °

## 2021-04-03 DIAGNOSIS — M79641 Pain in right hand: Secondary | ICD-10-CM | POA: Diagnosis not present

## 2021-04-03 DIAGNOSIS — Z9181 History of falling: Secondary | ICD-10-CM | POA: Diagnosis not present

## 2021-04-03 DIAGNOSIS — S61411A Laceration without foreign body of right hand, initial encounter: Secondary | ICD-10-CM | POA: Diagnosis not present

## 2021-04-04 ENCOUNTER — Other Ambulatory Visit: Payer: Self-pay | Admitting: Neurological Surgery

## 2021-04-04 DIAGNOSIS — S065XAA Traumatic subdural hemorrhage with loss of consciousness status unknown, initial encounter: Secondary | ICD-10-CM

## 2021-04-04 DIAGNOSIS — S065X9A Traumatic subdural hemorrhage with loss of consciousness of unspecified duration, initial encounter: Secondary | ICD-10-CM

## 2021-04-09 ENCOUNTER — Encounter: Payer: Self-pay | Admitting: Psychiatry

## 2021-04-09 ENCOUNTER — Ambulatory Visit: Payer: Medicare HMO | Admitting: Psychiatry

## 2021-04-09 NOTE — Progress Notes (Signed)
Admin note for non-service contact  Patient ID: Derrick [name]  MRN: [id number] DATE: 04/09/2021  SNCA today due to W being admitted to hospital.  [place], RS as able.  [name], PhD [name], PhD LP Clinical Psychologist, [facility] [address] [place], [place] [address] [phone]

## 2021-04-11 ENCOUNTER — Other Ambulatory Visit: Payer: Self-pay

## 2021-04-11 ENCOUNTER — Encounter: Payer: Self-pay | Admitting: Family Medicine

## 2021-04-11 ENCOUNTER — Ambulatory Visit (INDEPENDENT_AMBULATORY_CARE_PROVIDER_SITE_OTHER): Payer: Medicare HMO | Admitting: Family Medicine

## 2021-04-11 VITALS — BP 103/66 | HR 79 | Temp 97.8°F | Ht 71.0 in | Wt 176.4 lb

## 2021-04-11 DIAGNOSIS — S61411A Laceration without foreign body of right hand, initial encounter: Secondary | ICD-10-CM

## 2021-04-11 DIAGNOSIS — S61411D Laceration without foreign body of right hand, subsequent encounter: Secondary | ICD-10-CM

## 2021-04-11 NOTE — Patient Instructions (Signed)
Wound Care, Adult Taking care of your wound properly can help to prevent pain, infection, and scarring. It can also help your wound heal more quickly. Follow instructions from your health care provider about how to care for your wound. Supplies needed:  Soap and water.  Wound cleanser.  Gauze.  If needed, a clean bandage (dressing) or other type of wound dressing material to cover or place in the wound. Follow your health care provider's instructions about what dressing supplies to use.  Cream or ointment to apply to the wound, if told by your health care provider. How to care for your wound Cleaning the wound Ask your health care provider how to clean the wound. This may include:  Using mild soap and water or a wound cleanser.  Using a clean gauze to pat the wound dry after cleaning it. Do not rub or scrub the wound. Dressing care  Wash your hands with soap and water for at least 20 seconds before and after you change the dressing. If soap and water are not available, use hand sanitizer.  Change your dressing as told by your health care provider. This may include: ? Cleaning or rinsing out (irrigating) the wound. ? Placing a dressing over the wound or in the wound (packing). ? Covering the wound with an outer dressing.  Leave any stitches (sutures), skin glue, or adhesive strips in place. These skin closures may need to stay in place for 2 weeks or longer. If adhesive strip edges start to loosen and curl up, you may trim the loose edges. Do not remove adhesive strips completely unless your health care provider tells you to do that.  Ask your health care provider when you can leave the wound uncovered. Checking for infection Check your wound area every day for signs of infection. Check for:  More redness, swelling, or pain.  Fluid or blood.  Warmth.  Pus or a bad smell.   Follow these instructions at home Medicines  If you were prescribed an antibiotic medicine, cream, or  ointment, take or apply it as told by your health care provider. Do not stop using the antibiotic even if your condition improves.  If you were prescribed pain medicine, take it 30 minutes before you do any wound care or as told by your health care provider.  Take over-the-counter and prescription medicines only as told by your health care provider. Eating and drinking  Eat a diet that includes protein, vitamin A, vitamin C, and other nutrient-rich foods to help the wound heal. ? Foods rich in protein include meat, fish, eggs, dairy, beans, and nuts. ? Foods rich in vitamin A include carrots and dark green, leafy vegetables. ? Foods rich in vitamin C include citrus fruits, tomatoes, broccoli, and peppers.  Drink enough fluid to keep your urine pale yellow. General instructions  Do not take baths, swim, use a hot tub, or do anything that would put the wound underwater until your health care provider approves. Ask your health care provider if you may take showers. You may only be allowed to take sponge baths.  Do not scratch or pick at the wound. Keep it covered as told by your health care provider.  Return to your normal activities as told by your health care provider. Ask your health care provider what activities are safe for you.  Protect your wound from the sun when you are outside for the first 6 months, or for as long as told by your health care provider. Cover   up the scar area or apply sunscreen that has an SPF of at least 30.  Do not use any products that contain nicotine or tobacco, such as cigarettes, e-cigarettes, and chewing tobacco. These may delay wound healing. If you need help quitting, ask your health care provider.  Keep all follow-up visits as told by your health care provider. This is important. Contact a health care provider if:  You received a tetanus shot and you have swelling, severe pain, redness, or bleeding at the injection site.  Your pain is not controlled  with medicine.  You have any of these signs of infection: ? More redness, swelling, or pain around the wound. ? Fluid or blood coming from the wound. ? Warmth coming from the wound. ? Pus or a bad smell coming from the wound. ? A fever or chills.  You are nauseous or you vomit.  You are dizzy. Get help right away if:  You have a red streak of skin near the area around your wound.  Your wound has been closed with staples, sutures, skin glue, or adhesive strips and it begins to open up and separate.  Your wound is bleeding, and the bleeding does not stop with gentle pressure.  You have a rash.  You faint.  You have trouble breathing. These symptoms may represent a serious problem that is an emergency. Do not wait to see if the symptoms will go away. Get medical help right away. Call your local emergency services (911 in the [place] Do not drive yourself to the hospital. Summary  Always wash your hands with soap and water for at least 20 seconds before and after changing your dressing.  Change your dressing as told by your health care provider.  To help with healing, eat foods that are rich in protein, vitamin A, vitamin C, and other nutrients.  Check your wound every day for signs of infection. Contact your health care provider if you suspect that your wound is infected. This information is not intended to replace advice given to you by your health care provider. Make sure you discuss any questions you have with your health care provider. Document Revised: 09/16/2019 Document Reviewed: 09/16/2019 Elsevier Patient Education  2021 Elsevier Inc.  

## 2021-04-11 NOTE — Progress Notes (Signed)
Acute Office Visit  Subjective:    Patient ID: Derrick [name], male    DOB: [birth year], 65 y.o.   MRN: [id number]  Chief Complaint  Patient presents with  . Suture / Staple Removal    HPI Patient is in today for suture removal. Derrick [name] was working at [employer] 8 days ago when he fell and caught his hand on a railroad tie. This resulted in a laceration to the webbing of his right hand. He was seen at an urgent care. They did an xray of his hand that was normal. They sutured the laceration and gave him a prescription for an antibiotic. He cannot remember the name of the antibiotics but reports that he has been taking it as prescribed and has 3 days left of this. He denies fever. Denies erythema, swelling, or decreased ROM of this hand. The area of the laceration is a little tender to the touch. Denies drainage.   Past Medical History:  Diagnosis Date  . Anxiety   . Bipolar 1 disorder ([place])   . Bipolar disorder ([place]) 10/05/2009   Qualifier: Diagnosis of  By: [name]    . BPH (benign prostatic hypertrophy)   . CAD (coronary artery disease), native coronary artery    a. DES x 2 to RCA in 2004 b. 02/11/17 PCI w/DES x1 to mRCA  . Coronary atherosclerosis 10/05/2009   Qualifier: Diagnosis of  By: [name]   ANGIOGRAPHIC DATA:  1. Left main coronary artery was free of critical disease.  2. The LAD coursed to the apex. There is mild luminal irregularity  including mild irregularity of the diagonal. No high grade areas of  stenosis were noted. There was faint collateralization of the distal  right coronary circulation.  3. The circumflex provided two major m  . Depression   . Dyspnea 09/20/2012  . Essential and other specified forms of tremor 02/05/2013  . HYPERCHOLESTEROLEMIA 10/05/2009   Qualifier: Diagnosis of  By: [name]    . Hyperlipidemia   . Memory loss 02/05/2013  . Mitral regurgitation and aortic stenosis   . MITRAL STENOSIS/ INSUFFICIENCY, NON-RHEUMATIC 01/06/2011    Qualifier: Diagnosis of  By: [name], MD, [name] Study Conclusions  - Left ventricle: The cavity size was normal. Wall thickness was increased in a pattern of mild LVH. Systolic function was normal. The estimated ejection fraction was in the range of 55% to 60%. Wall motion was normal; there were no regional wall motion abnormalities. Left ventricular diastolic function parameters were  . MVP (mitral valve prolapse)   . Myocardial infarction ([place])   . Obstructive sleep apnea 02/05/2013  . OSA (obstructive sleep apnea) 03/22/2013  . Parasomnia 02/05/2013  . Sleep apnea   . Sleep disorder with cognitive complaints 10/26/2018  . Unspecified hereditary and idiopathic peripheral neuropathy 02/05/2013    Past Surgical History:  Procedure Laterality Date  . CORONARY STENT INTERVENTION N/A 02/12/2017   Procedure: Coronary Stent Intervention;  Surgeon: [name], MD;  Location: [facility];  Service: Cardiovascular;  Laterality: N/A;  . CRANIOTOMY Left 01/28/2021   Procedure: CRANIOTOMY HEMATOMA EVACUATION SUBDURAL;  Surgeon: [name], MD;  Location: [facility];  Service: Neurosurgery;  Laterality: Left;  . LEFT HEART CATH AND CORONARY ANGIOGRAPHY N/A 02/12/2017   Procedure: Left Heart Cath and Coronary Angiography;  Surgeon: [name], MD;  Location: [facility];  Service: Cardiovascular;  Laterality: N/A;    Family History  Problem Relation Age  of Onset  . Heart disease Mother   . COPD Father   . Colon cancer Sister   . Heart disease Brother   . Esophageal cancer Neg Hx   . Rectal cancer Neg Hx   . Stomach cancer Neg Hx     Social History   Socioeconomic History  . Marital status: Married    Spouse name: Not on file  . Number of children: Not on file  . Years of education: Not on file  . Highest education level: Not on file  Occupational History  . Not on file  Tobacco Use  . Smoking status: Current Some Day Smoker    Packs/day: 0.10    Types:  Cigarettes    Last attempt to quit: 02/05/2003    Years since quitting: 18.1  . Smokeless tobacco: Never Used  . Tobacco comment: 3 cigs per day  Vaping Use  . Vaping Use: Never used  Substance and Sexual Activity  . Alcohol use: Not Currently    Comment:    . Drug use: No  . Sexual activity: Not on file  Other Topics Concern  . Not on file  Social History Narrative  . Not on file   Social Determinants of Health   Financial Resource Strain: Not on file  Food Insecurity: Not on file  Transportation Needs: Not on file  Physical Activity: Not on file  Stress: Not on file  Social Connections: Not on file  Intimate Partner Violence: Not on file    Outpatient Medications Prior to Visit  Medication Sig Dispense Refill  . acetaminophen (TYLENOL) 500 MG tablet Take 500 mg by mouth every 6 (six) hours as needed for mild pain.    [name] atorvastatin (LIPITOR) 80 MG tablet TAKE 1 TABLET EVERY DAY 90 tablet 3  . divalproex (DEPAKOTE) 500 MG DR tablet Take 4 tablets (2,000 mg total) by mouth at bedtime. 360 tablet 1  . escitalopram (LEXAPRO) 20 MG tablet TAKE 1 TABLET EVERY DAY 90 tablet 1  . ezetimibe (ZETIA) 10 MG tablet Take 1 tablet (10 mg total) by mouth daily. 90 tablet 1  . HYDROcodone-acetaminophen (NORCO/VICODIN) 5-325 MG tablet Take 1 tablet by mouth every 6 (six) hours as needed for moderate pain. 20 tablet 0  . ipratropium (ATROVENT) 0.06 % nasal spray Place 2 sprays into both nostrils 3 (three) times daily as needed for congestion.    . isosorbide mononitrate (IMDUR) 30 MG 24 hr tablet Take 0.5 tablets (15 mg total) by mouth daily. 45 tablet 2  . lamoTRIgine (LAMICTAL) 100 MG tablet TAKE 1 AND 1/2 TABLETS EVERY DAY 135 tablet 0  . metoprolol tartrate (LOPRESSOR) 25 MG tablet Take 1 tablet (25 mg total) by mouth 2 (two) times daily. 180 tablet 3  . montelukast (SINGULAIR) 10 MG tablet Take 1 tablet (10 mg total) by mouth daily. 90 tablet 3  . nitroGLYCERIN (NITROSTAT) 0.4 MG SL  tablet Place 1 tablet (0.4 mg total) under the tongue every 5 (five) minutes as needed for chest pain. 25 tablet 3  . ondansetron (ZOFRAN-ODT) 8 MG disintegrating tablet Take 8 mg by mouth every 8 (eight) hours as needed for nausea/vomiting.    . pantoprazole (PROTONIX) 40 MG tablet TAKE 1 TABLET EVERY DAY 90 tablet 2   Facility-Administered Medications Prior to Visit  Medication Dose Route Frequency Provider Last Rate Last Admin  . 0.9 %  sodium chloride infusion  500 mL Intravenous Once Jackquline Denmark, MD  No Known Allergies  Review of Systems As per HPI.     Objective:    Physical Exam Vitals and nursing note reviewed.  Constitutional:      General: He is not in acute distress.    Appearance: He is not ill-appearing, toxic-appearing or diaphoretic.  Skin:    General: Skin is warm and dry.     Findings: Laceration present. No erythema.     Comments: Healing laceration to webbing of right hand between thumb and first finger. Sutures removed today.  Edges approximated. No bleeding, swelling, drainage, or erythema present.  Full ROM. Sensation intact.   Neurological:     Mental Status: He is alert.   Suture Removal  Date/Time: 04/11/2021 9:16 AM Performed by: [name], [name] Authorized by: [name], [name]  Location: right hand. Wound Appearance: clean Sutures Removed: 2 Post-removal: antibiotic ointment applied Patient tolerance: patient tolerated the procedure well with no immediate complications    BP 336/12   Pulse 79   Temp 97.8 F (36.6 C) (Temporal)   Ht '5\' 11"'  (1.803 m)   Wt 176 lb 6 oz (80 kg)   BMI 24.60 kg/m  Wt Readings from Last 3 Encounters:  04/11/21 176 lb 6 oz (80 kg)  03/18/21 176 lb (79.8 kg)  02/21/21 177 lb (80.3 kg)    There are no preventive care reminders to display for this patient.  There are no preventive care reminders to display for this patient.   Lab Results  Component Value Date   TSH 1.950 03/18/2021    Lab Results  Component Value Date   WBC 6.4 03/18/2021   HGB 12.9 (L) 03/18/2021   HCT 37.2 (L) 03/18/2021   MCV 94 03/18/2021   PLT 197 03/18/2021   Lab Results  Component Value Date   NA 140 03/18/2021   K 5.0 03/18/2021   CO2 22 03/18/2021   GLUCOSE 106 (H) 03/18/2021   BUN 10 03/18/2021   CREATININE 0.82 03/18/2021   BILITOT 0.3 03/18/2021   ALKPHOS 55 03/18/2021   AST 11 03/18/2021   ALT 9 03/18/2021   PROT 6.5 03/18/2021   ALBUMIN 3.7 (L) 03/18/2021   CALCIUM 9.1 03/18/2021   ANIONGAP 9 01/28/2021   EGFR 99 03/18/2021   GFR 90.20 10/18/2014   Lab Results  Component Value Date   CHOL 95 (L) 03/07/2021   Lab Results  Component Value Date   HDL 39 (L) 03/07/2021   Lab Results  Component Value Date   LDLCALC 40 03/07/2021   Lab Results  Component Value Date   TRIG 79 03/07/2021   Lab Results  Component Value Date   CHOLHDL 2.4 03/07/2021   No results found for: HGBA1C     Assessment & Plan:   Klay was seen today for suture / staple removal.  Diagnoses and all orders for this visit:  Laceration of right hand without foreign body, subsequent encounter Sutures removed today. Healing well. Discussed wound care. Handout given. Return to office for new or worsening symptoms, or if symptoms persist.  -     Suture Removal  The patient indicates understanding of these issues and agrees with the plan.  [name], [name]

## 2021-04-12 ENCOUNTER — Ambulatory Visit: Payer: Medicare HMO | Admitting: Family Medicine

## 2021-04-17 ENCOUNTER — Other Ambulatory Visit: Payer: Self-pay

## 2021-04-17 ENCOUNTER — Other Ambulatory Visit: Payer: Self-pay | Admitting: *Deleted

## 2021-04-17 ENCOUNTER — Ambulatory Visit (INDEPENDENT_AMBULATORY_CARE_PROVIDER_SITE_OTHER): Payer: Medicare HMO | Admitting: Psychiatry

## 2021-04-17 DIAGNOSIS — F902 Attention-deficit hyperactivity disorder, combined type: Secondary | ICD-10-CM

## 2021-04-17 DIAGNOSIS — Z630 Problems in relationship with spouse or partner: Secondary | ICD-10-CM | POA: Diagnosis not present

## 2021-04-17 DIAGNOSIS — F068 Other specified mental disorders due to known physiological condition: Secondary | ICD-10-CM

## 2021-04-17 DIAGNOSIS — Z9889 Other specified postprocedural states: Secondary | ICD-10-CM | POA: Diagnosis not present

## 2021-04-17 DIAGNOSIS — Z87898 Personal history of other specified conditions: Secondary | ICD-10-CM

## 2021-04-17 DIAGNOSIS — F319 Bipolar disorder, unspecified: Secondary | ICD-10-CM | POA: Diagnosis not present

## 2021-04-17 DIAGNOSIS — F1991 Other psychoactive substance use, unspecified, in remission: Secondary | ICD-10-CM

## 2021-04-17 MED ORDER — ISOSORBIDE MONONITRATE ER 30 MG PO TB24: 15.0000 mg | ORAL_TABLET | Freq: Every day | ORAL | 3 refills | Status: DC

## 2021-04-17 NOTE — Progress Notes (Signed)
Psychotherapy Progress Note [facility] Derrick Moore, PhD LP  Patient ID: Derrick [name]     MRN: [id number] Therapy format: Individual psychotherapy Date: 04/17/2021      Start: 3:16p     Stop: 4:04p     Time Spent: 48 min Location: In-person   Session narrative (presenting needs, interim history, self-report of stressors and symptoms, applications of prior therapy, status changes, and interventions made in session) RS'd from last week when [facility] had intense back and chest pain, went to hospital.  First reaction was to resent how she didn't take him to the hospital when he had his subdural hematoma in February, but eventually helped her out.  She turned out to have a pancreatic cyst.  Himself, recovered fully from subdural hematoma.  Derrick, continues better attention from [name] since then.    In contrast, this morning "opened up the old Derrick [name]" -- got frustrated not being able to find supplies to clean the car, started cussing, another frustration with the dog puncturing the hose to the above-ground pool, irritated about Derrick [name] talking out of both sides ( one day questioning the expense of a soda, the next day blithely telling him to go buy something he couldn't find).  Today, felt on the verge of breaking things at home, glad he had this appt.    Acknowledged spring tends to bring out his irritability -- attributed to pool work.  Assessed seasonal affect -- denies decreased need for sleep or rash judgments, but found he had low iron, is on supplement.  Still falls asleep often.  Nights typically stays up to 1am, gets up about 6am, sometimes may lounge until 11:30a.  Did not assess caffeine or other dietary stimulants, but suspect this may be an issue.  Still dealing with Derrick [name] actively anorexic, just saw for granddaughter's 18th birthday.  Still hard to see her skinny, nervous, reactive, and now in knee pain from compulsive walking.  Currently smoking about 4 cigarettes a day,  often 1/2 at a time.  Still looks like she could get worse and die, like her sister.  Support/empathy provided.   Therapeutic modalities: Cognitive Behavioral Therapy, Solution-Oriented/Positive Psychology, and Ego-Supportive  Mental Status/Observations:  Appearance:   Casual     Behavior:  Appropriate and somewhat agitated  Motor:  Normal  Speech/Language:   Mild pressure  Affect:  Appropriate  Mood:  irritable  Thought process:  normal  Thought content:    Rumination  Sensory/Perceptual disturbances:    WNL  Orientation:  Fully oriented  Attention:  Fair    Concentration:  Good  Memory:  grossly intact  Insight:    Fair  Judgment:   Fair  Impulse Control:  Fair   Risk Assessment: Danger to Self: No Self-injurious Behavior: No Danger to Others: No Physical Aggression / Violence:  low, but possible Duty to Warn: No Access to Firearms a concern: Yes  Assessment of progress:  situational setback(s)  Diagnosis:   ICD-10-CM   1. Bipolar I disorder ([place])  F31.9   2. Cognitive dysfunction in bipolar disorder ([place])  F06.8    F31.9   3. Attention deficit hyperactivity disorder (ADHD), combined type  F90.2   4. Marital stress  Z63.0   5. History of multiple substance abuse disorders  Z87.898   6. S/P craniotomy for subdural hematoma  Z98.890    Plan:  Address low iron as recommended Should assess caffeine habit next time Ensure proper use of mood stabilizer, may need to assess  further with psychiatry and is currently unscheduled Address resentment against wife, subdue thoughts of unfairness from her, since she is also battling medical problems and depression, and her daughter, too, is very sick, and she lost a daughter, too before this Pledge no harm to anyone or will call for help before acting Other recommendations/advice as may be noted above Continue to utilize previously learned skills ad lib Maintain medication as prescribed and work faithfully with relevant  prescriber(s) if any changes are desired or seem indicated Call the clinic on-call service, present to ER, or call 911 if any life-threatening psychiatric crisis Return 4-6 wks, for needs reschedule with prescriber. Already scheduled visit in this office Visit date not found.  Derrick Serve, PhD Derrick Moore, PhD LP Clinical Psychologist, [facility] [address] [place], [place] [address] [address] [phone]

## 2021-04-25 ENCOUNTER — Ambulatory Visit (INDEPENDENT_AMBULATORY_CARE_PROVIDER_SITE_OTHER): Payer: Medicare HMO | Admitting: Licensed Clinical Social Worker

## 2021-04-25 DIAGNOSIS — F99 Mental disorder, not otherwise specified: Secondary | ICD-10-CM

## 2021-04-25 DIAGNOSIS — F411 Generalized anxiety disorder: Secondary | ICD-10-CM

## 2021-04-25 DIAGNOSIS — F3170 Bipolar disorder, currently in remission, most recent episode unspecified: Secondary | ICD-10-CM

## 2021-04-25 DIAGNOSIS — R413 Other amnesia: Secondary | ICD-10-CM

## 2021-04-25 DIAGNOSIS — E7800 Pure hypercholesterolemia, unspecified: Secondary | ICD-10-CM

## 2021-04-25 DIAGNOSIS — F5105 Insomnia due to other mental disorder: Secondary | ICD-10-CM

## 2021-04-25 DIAGNOSIS — G4733 Obstructive sleep apnea (adult) (pediatric): Secondary | ICD-10-CM

## 2021-04-25 NOTE — Chronic Care Management (AMB) (Signed)
Chronic Care Management    Clinical Social Work Note  04/25/2021 Name: Derrick [name] MRN: [id number] DOB: 06-26-1956  Derrick [name] is a 65 y.o. year old male who is a primary care patient of Gabriel Earing, [name]. The CCM team was consulted to assist the patient with chronic disease management and/or care coordination needs related to: [facility] .   Engaged with patient by telephone for follow up visit in response to provider referral for social work chronic care management and care coordination services.   Consent to Services:  The patient was given information about Chronic Care Management services, agreed to services, and gave verbal consent prior to initiation of services.  Please see initial visit note for detailed documentation.   Patient agreed to services and consent obtained.   Assessment: Review of patient past medical history, allergies, medications, and health status, including review of relevant consultants reports was performed today as part of a comprehensive evaluation and provision of chronic care management and care coordination services.     SDOH (Social Determinants of Health) assessments and interventions performed:  SDOH Interventions   Flowsheet Row Most Recent Value  SDOH Interventions   Depression Interventions/Treatment  Medication       Advanced Directives Status: See Vynca application for related entries.  CCM Care Plan  No Known Allergies  Outpatient Encounter Medications as of 04/25/2021  Medication Sig  . acetaminophen (TYLENOL) 500 MG tablet Take 500 mg by mouth every 6 (six) hours as needed for mild pain.  [name] atorvastatin (LIPITOR) 80 MG tablet TAKE 1 TABLET EVERY DAY  . divalproex (DEPAKOTE) 500 MG DR tablet Take 4 tablets (2,000 mg total) by mouth at bedtime.  [name] escitalopram (LEXAPRO) 20 MG tablet TAKE 1 TABLET EVERY DAY  . ezetimibe (ZETIA) 10 MG tablet Take 1 tablet (10 mg total) by mouth daily.  [name] HYDROcodone-acetaminophen  (NORCO/VICODIN) 5-325 MG tablet Take 1 tablet by mouth every 6 (six) hours as needed for moderate pain.  [name] ipratropium (ATROVENT) 0.06 % nasal spray Place 2 sprays into both nostrils 3 (three) times daily as needed for congestion.  . isosorbide mononitrate (IMDUR) 30 MG 24 hr tablet Take 0.5 tablets (15 mg total) by mouth daily.  [name] lamoTRIgine (LAMICTAL) 100 MG tablet TAKE 1 AND 1/2 TABLETS EVERY DAY  . metoprolol tartrate (LOPRESSOR) 25 MG tablet Take 1 tablet (25 mg total) by mouth 2 (two) times daily.  . montelukast (SINGULAIR) 10 MG tablet Take 1 tablet (10 mg total) by mouth daily.  . nitroGLYCERIN (NITROSTAT) 0.4 MG SL tablet Place 1 tablet (0.4 mg total) under the tongue every 5 (five) minutes as needed for chest pain.  [name] ondansetron (ZOFRAN-ODT) 8 MG disintegrating tablet Take 8 mg by mouth every 8 (eight) hours as needed for nausea/vomiting.  . pantoprazole (PROTONIX) 40 MG tablet TAKE 1 TABLET EVERY DAY   Facility-Administered Encounter Medications as of 04/25/2021  Medication  . 0.9 %  sodium chloride infusion    Patient Active Problem List   Diagnosis Date Noted  . Status post craniotomy 01/28/2021  . S/P craniotomy 01/28/2021  . Insomnia 11/29/2018  . Sleep disorder with cognitive complaints 10/26/2018  . OSA (obstructive sleep apnea) 03/22/2013  . Memory loss 02/05/2013  . Low back pain 02/05/2013  . Unspecified hereditary and idiopathic peripheral neuropathy 02/05/2013  . Essential and other specified forms of tremor 02/05/2013  . Parasomnia 02/05/2013  . Obstructive sleep apnea 02/05/2013  . Dyspnea 09/20/2012  . MITRAL STENOSIS/ INSUFFICIENCY,  NON-RHEUMATIC 01/06/2011  . HYPERCHOLESTEROLEMIA 10/05/2009  . Bipolar disorder ([name]) 10/05/2009  . ANXIETY DISORDER 10/05/2009  . TOBACCO ABUSE 10/05/2009  . Coronary atherosclerosis 10/05/2009    Conditions to be addressed/monitored:  Monitor client management of anxiety and depression issues  Care Plan : LCSW care plan   Updates made by Isaiah Blakes, LCSW since 04/25/2021 12:00 AM    Problem: Emotional Distress     Goal: Emotional Health Supported;Manage anxiety issues; manage depression issues   Start Date: 04/25/2021  Expected End Date: 07/26/2021  This Visit's Progress: On track  Priority: Medium  Note:   Current Barriers:  . Chronic Mental Health needs related to anxiety and depression management . Some mobility issues; some pain issues . Suicidal Ideation/Homicidal Ideation: No  Clinical Social Work Goal(s):  . patient will work with SW monthly by telephone or in person to reduce or manage symptoms related to  anxiety and depression issues faced by client . patient will work with SW monthly to address concerns related to mobility issues of client and related to pain issues of client  Interventions: . 1:1 collaboration with Gabriel Earing, [name] regarding development and update of comprehensive plan of care as evidenced by provider attestation and co-signature . Talked with client about his recent surgery in February of 2022. . Talked with client about pain issues of client . Talked with client about sleeping issues of client . Talked with client about memory issues of client . Talked with client about tremor of client (tremor in hands; has trouble writing or has trouble holding a glass) . Talked with client about ADLs completion (has some difficulty with buttons on clothes) . Talked with client about ambulation of client . Talked with client about his upcoming medical appointments . Talked with client about mood of client . Talked with client about transport needs of client . Talked with client about relaxation techniques (watches TV) . Talked with client about family support (spouse is supportive ) . Encouraged client to call RNCM as needed for CCM nursing support  Patient Self Care Activities:  . Self administers medications as prescribed . Attends all scheduled provider  appointments . Performs ADL's independently  Patient Coping Strengths:  . Family . Friends  Patient Self Care Deficits:  [name] Depression issues . Anxiety issues  Patient Goals:  - spend time or talk with others at least 2 to 3 times per week - practice relaxation or meditation daily - keep a calendar with appointment dates  Follow Up Plan: LCSW to call client on 06/05/21     Kelton Pillar.Wah Sabic MSW, LCSW Licensed Clinical Social Worker [facility] Care Management [phone]

## 2021-04-25 NOTE — Patient Instructions (Signed)
Visit Information  PATIENT GOALS: Goals Addressed            This Visit's Progress   . Protect My Health; Manage anxiety issues; Manage depression issues       Timeframe:  Short-Term Goal Priority:  Medium Progress: On Track Start Date:             04/25/21                Expected End Date:           07/26/21            Follow Up Date 06/05/21   Protect My Health (Patient) Manage anxiety issues; Manage depression issues   Why is this important?    Screening tests can find diseases early when they are easier to treat.   Your doctor or nurse will talk with you about which tests are important for you.   Getting shots for common diseases like the flu and shingles will help prevent them.     Patient Self Care Activities:  . Self administers medications as prescribed . Attends all scheduled provider appointments . Performs ADL's independently  Patient Coping Strengths:  . Family . Friends  Patient Self Care Deficits:  [name] Depression issues . Anxiety issues  Patient Goals:  - spend time or talk with others at least 2 to 3 times per week - practice relaxation or meditation daily - keep a calendar with appointment dates  Follow Up Plan: LCSW to call client on 06/05/21       [name].Ilda Laskin MSW, LCSW Licensed Clinical Social Worker [facility] Care Management [phone]

## 2021-04-29 DIAGNOSIS — E7800 Pure hypercholesterolemia, unspecified: Secondary | ICD-10-CM | POA: Diagnosis not present

## 2021-04-29 DIAGNOSIS — Z0100 Encounter for examination of eyes and vision without abnormal findings: Secondary | ICD-10-CM | POA: Diagnosis not present

## 2021-04-29 DIAGNOSIS — H5210 Myopia, unspecified eye: Secondary | ICD-10-CM | POA: Diagnosis not present

## 2021-05-04 ENCOUNTER — Ambulatory Visit
Admission: RE | Admit: 2021-05-04 | Discharge: 2021-05-04 | Disposition: A | Discharge status: Home / Self Care | Payer: Medicare HMO | Source: Ambulatory Visit | Attending: Neurological Surgery | Admitting: Neurological Surgery

## 2021-05-04 ENCOUNTER — Other Ambulatory Visit: Payer: Self-pay

## 2021-05-04 DIAGNOSIS — S065XAA Traumatic subdural hemorrhage with loss of consciousness status unknown, initial encounter: Secondary | ICD-10-CM

## 2021-05-04 DIAGNOSIS — S065X9A Traumatic subdural hemorrhage with loss of consciousness of unspecified duration, initial encounter: Secondary | ICD-10-CM

## 2021-05-04 DIAGNOSIS — S0990XA Unspecified injury of head, initial encounter: Secondary | ICD-10-CM | POA: Diagnosis not present

## 2021-05-09 ENCOUNTER — Other Ambulatory Visit: Payer: Medicare HMO

## 2021-05-09 DIAGNOSIS — S065X9A Traumatic subdural hemorrhage with loss of consciousness of unspecified duration, initial encounter: Secondary | ICD-10-CM | POA: Diagnosis not present

## 2021-05-17 ENCOUNTER — Ambulatory Visit: Payer: Medicare HMO | Admitting: Psychiatry

## 2021-06-05 ENCOUNTER — Ambulatory Visit (INDEPENDENT_AMBULATORY_CARE_PROVIDER_SITE_OTHER): Payer: Medicare HMO | Admitting: Licensed Clinical Social Worker

## 2021-06-05 DIAGNOSIS — E7800 Pure hypercholesterolemia, unspecified: Secondary | ICD-10-CM | POA: Diagnosis not present

## 2021-06-05 DIAGNOSIS — R413 Other amnesia: Secondary | ICD-10-CM | POA: Diagnosis not present

## 2021-06-05 DIAGNOSIS — F3170 Bipolar disorder, currently in remission, most recent episode unspecified: Secondary | ICD-10-CM

## 2021-06-05 DIAGNOSIS — F5105 Insomnia due to other mental disorder: Secondary | ICD-10-CM

## 2021-06-05 DIAGNOSIS — G4733 Obstructive sleep apnea (adult) (pediatric): Secondary | ICD-10-CM

## 2021-06-05 NOTE — Patient Instructions (Signed)
Visit Information  PATIENT GOALS:  Goals Addressed             This Visit's Progress    Protect My Health; Manage anxiety issues; Manage depression issues       Timeframe:  Short-Term Goal Priority:  Medium Progress: On Track Start Date:             06/05/21                Expected End Date:           08/30/21           Follow Up Date 07/19/21   Protect My Health (Patient) Manage anxiety issues; Manage depression issues   Why is this important?   Screening tests can find diseases early when they are easier to treat.  Your doctor or nurse will talk with you about which tests are important for you.  Getting shots for common diseases like the flu and shingles will help prevent them.     Patient Self Care Activities:  Self administers medications as prescribed Attends all scheduled provider appointments Performs ADL's independently  Patient Coping Strengths:  Family Friends  Patient Self Care Deficits:  Depression issues Anxiety issues  Patient Goals:  - spend time or talk with others at least 2 to 3 times per week - practice relaxation or meditation daily - keep a calendar with appointment dates  Follow Up Plan: LCSW to call client on 07/19/21     [name].Ayodeji Keimig MSW, LCSW Licensed Clinical Social Worker [facility] Care Management [phone]

## 2021-06-05 NOTE — Chronic Care Management (AMB) (Signed)
Chronic Care Management    Clinical Social Work Note  06/05/2021 Name: Derrick [name] MRN: [id number] DOB: [birth year]  Derrick [name] is a 65 y.o. year old male who is a primary care patient of Gabriel Earing, [name]. The CCM team was consulted to assist the patient with chronic disease management and/or care coordination needs related to: [facility] .   Engaged with patient /spouse of patient, bernhardt, [place] telephone for follow up visit in response to provider referral for social work chronic care management and care coordination services.   Consent to Services:  The patient was given information about Chronic Care Management services, agreed to services, and gave verbal consent prior to initiation of services.  Please see initial visit note for detailed documentation.   Patient agreed to services and consent obtained.   Assessment: Review of patient past medical history, allergies, medications, and health status, including review of relevant consultants reports was performed today as part of a comprehensive evaluation and provision of chronic care management and care coordination services.     SDOH (Social Determinants of Health) assessments and interventions performed:  SDOH Interventions    Flowsheet Row Most Recent Value  SDOH Interventions   Depression Interventions/Treatment  Medication, Currently on Treatment        Advanced Directives Status: See Vynca application for related entries.  CCM Care Plan  No Known Allergies  Outpatient Encounter Medications as of 06/05/2021  Medication Sig   acetaminophen (TYLENOL) 500 MG tablet Take 500 mg by mouth every 6 (six) hours as needed for mild pain.   atorvastatin (LIPITOR) 80 MG tablet TAKE 1 TABLET EVERY DAY   divalproex (DEPAKOTE) 500 MG DR tablet Take 4 tablets (2,000 mg total) by mouth at bedtime.   escitalopram (LEXAPRO) 20 MG tablet TAKE 1 TABLET EVERY DAY   ezetimibe (ZETIA) 10 MG tablet Take 1 tablet (10 mg  total) by mouth daily.   HYDROcodone-acetaminophen (NORCO/VICODIN) 5-325 MG tablet Take 1 tablet by mouth every 6 (six) hours as needed for moderate pain.   ipratropium (ATROVENT) 0.06 % nasal spray Place 2 sprays into both nostrils 3 (three) times daily as needed for congestion.   isosorbide mononitrate (IMDUR) 30 MG 24 hr tablet Take 0.5 tablets (15 mg total) by mouth daily.   lamoTRIgine (LAMICTAL) 100 MG tablet TAKE 1 AND 1/2 TABLETS EVERY DAY   metoprolol tartrate (LOPRESSOR) 25 MG tablet Take 1 tablet (25 mg total) by mouth 2 (two) times daily.   montelukast (SINGULAIR) 10 MG tablet Take 1 tablet (10 mg total) by mouth daily.   nitroGLYCERIN (NITROSTAT) 0.4 MG SL tablet Place 1 tablet (0.4 mg total) under the tongue every 5 (five) minutes as needed for chest pain.   ondansetron (ZOFRAN-ODT) 8 MG disintegrating tablet Take 8 mg by mouth every 8 (eight) hours as needed for nausea/vomiting.   pantoprazole (PROTONIX) 40 MG tablet TAKE 1 TABLET EVERY DAY   Facility-Administered Encounter Medications as of 06/05/2021  Medication   0.9 %  sodium chloride infusion    Patient Active Problem List   Diagnosis Date Noted   Status post craniotomy 01/28/2021   S/P craniotomy 01/28/2021   Insomnia 11/29/2018   Sleep disorder with cognitive complaints 10/26/2018   OSA (obstructive sleep apnea) 03/22/2013   Memory loss 02/05/2013   Low back pain 02/05/2013   Unspecified hereditary and idiopathic peripheral neuropathy 02/05/2013   Essential and other specified forms of tremor 02/05/2013   Parasomnia 02/05/2013   Obstructive sleep apnea 02/05/2013  Dyspnea 09/20/2012   MITRAL STENOSIS/ INSUFFICIENCY, NON-RHEUMATIC 01/06/2011   HYPERCHOLESTEROLEMIA 10/05/2009   Bipolar disorder ([name]) 10/05/2009   ANXIETY DISORDER 10/05/2009   TOBACCO ABUSE 10/05/2009   Coronary atherosclerosis 10/05/2009    Conditions to be addressed/monitored: Monitor client management of anxiety and depression issues    Care Plan : LCSW care plan  Updates made by Isaiah Blakes, LCSW since 06/05/2021 12:00 AM     Problem: Emotional Distress      Goal: Emotional Health Supported;Manage anxiety issues; manage depression issues   Start Date: 06/05/2021  Expected End Date: 08/30/2021  This Visit's Progress: On track  Recent Progress: On track  Priority: Medium  Note:   Current Barriers:  Chronic Mental Health needs related to anxiety and depression management Some mobility issues; some pain issues Suicidal Ideation/Homicidal Ideation: No  Clinical Social Work Goal(s):  patient will work with SW monthly by telephone or in person to reduce or manage symptoms related to  anxiety and depression issues faced by client patient will work with SW monthly to address concerns related to mobility issues of client and related to pain issues of client  Interventions: 1:1 collaboration with Gabriel Earing, [name] regarding development and update of comprehensive plan of care as evidenced by provider attestation and co-signature Chart reviewed  to assess client needs Talked with Winferd Humphrey about client needs. [name] asked if LCSW could call her at another time to further discuss client needs. LCSW agreed to call [name] later in the day. LCSW then called [name] at a later time in the afternoon and was not able to speak with her via phone. LCSW did leave phone message for [name], asking her to please call LCSW at [phone]   Patient Self Care Activities:  Self administers medications as prescribed Attends all scheduled provider appointments Performs ADL's independently  Patient Coping Strengths:  Family Friends  Patient Self Care Deficits:  Depression issues Anxiety issues  Patient Goals:  - spend time or talk with others at least 2 to 3 times per week - practice relaxation or meditation daily - keep a calendar with appointment dates  Follow Up Plan: LCSW to call client on 07/19/21      Kelton Pillar.Tiffinie Caillier MSW, LCSW Licensed Clinical Social Worker [facility] Care Management [phone]

## 2021-06-11 ENCOUNTER — Ambulatory Visit (INDEPENDENT_AMBULATORY_CARE_PROVIDER_SITE_OTHER): Payer: Medicare HMO | Admitting: Psychiatry

## 2021-06-11 DIAGNOSIS — F319 Bipolar disorder, unspecified: Secondary | ICD-10-CM

## 2021-06-11 DIAGNOSIS — F902 Attention-deficit hyperactivity disorder, combined type: Secondary | ICD-10-CM | POA: Diagnosis not present

## 2021-06-11 DIAGNOSIS — F068 Other specified mental disorders due to known physiological condition: Secondary | ICD-10-CM

## 2021-06-11 DIAGNOSIS — Z630 Problems in relationship with spouse or partner: Secondary | ICD-10-CM | POA: Diagnosis not present

## 2021-06-11 DIAGNOSIS — Z9889 Other specified postprocedural states: Secondary | ICD-10-CM

## 2021-06-11 DIAGNOSIS — Z87898 Personal history of other specified conditions: Secondary | ICD-10-CM

## 2021-06-11 DIAGNOSIS — F1991 Other psychoactive substance use, unspecified, in remission: Secondary | ICD-10-CM

## 2021-06-11 NOTE — Progress Notes (Signed)
Psychotherapy Progress Note [facility] [name], PhD LP  Patient ID: Derrick [name]     MRN: [id number] Therapy format: Individual psychotherapy Date: 06/11/2021      Start: 11:20a     Stop: 11:45p     Time Spent: 25 + 15 min record review  Location: Telehealth visit -- I connected with this patient by an approved telecommunication method (audio only), with his informed consent, and verifying identity and patient privacy.  I was located at my office and patient at his car.  As needed, we discussed the limitations, risks, and security and privacy concerns associated with telehealth service, including the availability and conditions which currently govern in-person appointments and the possibility that 3rd-party payment may not be fully guaranteed and he may be responsible for charges.  After he indicated understanding, we proceeded with the session.  Also discussed treatment planning, as needed, including ongoing verbal agreement with the plan, the opportunity to ask and answer all questions, his demonstrated understanding of instructions, and his readiness to call the office should symptoms worsen or he feels he is in a crisis state and needs more immediate and tangible assistance.   Session narrative (presenting needs, interim history, self-report of stressors and symptoms, applications of prior therapy, status changes, and interventions made in session) Reports he's very irritable today.  Let a guy borrow his trailer over the weekend, inconvenienced himself, started ruminating and feeling used.  Went to [employer] with [name] but wanted to not stay the whole week and says she got on his back about it.  No friends except two guys who smoke dope and drink and neighbors who can't hear well.  C/o bickering between him and wife lately.  Regrets getting the pool and the work and expense of it.    Cut session short d/t having a guy waiting for him at the house working on something.   Encouraged him to take care of practical business but actively work to not stew about resentments, address anything physical that may be irritating him, like sleep or food issues.  Therapeutic modalities: Cognitive Behavioral Therapy, Solution-Oriented/Positive Psychology, and Ego-Supportive  Mental Status/Observations:  Appearance:   Not assessed     Behavior:  Agitated  Motor:  Not assessed  Speech/Language:   Clear and Coherent  Affect:  Not assessed  Mood:  irritable  Thought process:  Some flight  Thought content:    Rumination  Sensory/Perceptual disturbances:    WNL  Orientation:  Fully oriented  Attention:  Fair    Concentration:  Fair  Memory:  grossly intact  Insight:    Fair  Judgment:   Fair  Impulse Control:  Fair   Risk Assessment: Danger to Self: No Self-injurious Behavior: No Danger to Others: No Physical Aggression / Violence:  unlikely, but history Duty to Warn: No Access to Firearms a concern: Yes  Assessment of progress:  situational setback(s)  Diagnosis:   ICD-10-CM   1. Bipolar I disorder ([place])  F31.9     2. Cognitive dysfunction in bipolar disorder ([place])  F06.8    F31.9     3. Attention deficit hyperactivity disorder (ADHD), combined type  F90.2     4. Marital stress  Z63.0     5. S/P craniotomy for subdural hematoma  Z98.890     6. History of multiple substance abuse disorders  Z87.898      Plan:  Address and medical or physical needs as soon as able, including pain, caffeine,  nutrition Commit to stop stewing as soon as can recognize it isn't helping him  Maintain abstinence from alcohol, pot, and other drugs of abuse Other recommendations/advice as may be noted above Continue to utilize previously learned skills ad lib Maintain medication as prescribed and work faithfully with relevant prescriber(s) if any changes are desired or seem indicated Call the clinic on-call service, present to ER, or call 911 if any life-threatening psychiatric  crisis Return for time as available.  Preferably within 1 month, subject to availability. Already scheduled visit in this office 06/12/2021.  Addendum: Review of chart on 09/08/21 shows failure to reschedule with [place], failure to f/u for 4-wk visit with psychiatry to address tremor (which may be vitamin deficiency or caffeine-related -- both are historical issues and he has episodically poor impulse control), referral for colonoscopy (on report of several months of black, tarry stools), and CCM contact by [facility] social worker as of 07/19/21, initiated by PCP.  He is currently scheduled for psychiatry 10/6, therapy 10/20.   [name], PhD [name], PhD LP Clinical Psychologist, [facility] [address] [place], [place] [address] [phone]

## 2021-06-12 ENCOUNTER — Telehealth (INDEPENDENT_AMBULATORY_CARE_PROVIDER_SITE_OTHER): Payer: Medicare HMO | Admitting: Physician Assistant

## 2021-06-12 ENCOUNTER — Encounter: Payer: Self-pay | Admitting: Physician Assistant

## 2021-06-12 DIAGNOSIS — R251 Tremor, unspecified: Secondary | ICD-10-CM | POA: Diagnosis not present

## 2021-06-12 DIAGNOSIS — F411 Generalized anxiety disorder: Secondary | ICD-10-CM | POA: Diagnosis not present

## 2021-06-12 DIAGNOSIS — F319 Bipolar disorder, unspecified: Secondary | ICD-10-CM | POA: Diagnosis not present

## 2021-06-12 DIAGNOSIS — Z9889 Other specified postprocedural states: Secondary | ICD-10-CM

## 2021-06-12 NOTE — Progress Notes (Signed)
[facility]  Patient ID: Derrick [name],  MRN: [id number]  PCP: [name], [name]  Date of Evaluation: 06/12/2021 Time spent:25 minutes  Chief Complaint:  Chief Complaint   Follow-up     Virtual Visit via Telehealth  I connected with patient by telephone, with their informed consent, and verified patient privacy and that I am speaking with the correct person using two identifiers.  I am private, in my office and the patient is in his car.  I discussed the limitations, risks, security and privacy concerns of performing an evaluation and management service by telephone and the availability of in person appointments. I also discussed with the patient that there may be a patient responsible charge related to this service. The patient expressed understanding and agreed to proceed.   I discussed the assessment and treatment plan with the patient. The patient was provided an opportunity to ask questions and all were answered. The patient agreed with the plan and demonstrated an understanding of the instructions.   The patient was advised to call back or seek an in-person evaluation if the symptoms worsen or if the condition fails to improve as anticipated.  I provided 25 minutes of non-face-to-face time during this encounter.  HISTORY/CURRENT STATUS: HPI for routine med check.  He fell and hit his head in the winter and had a subdural hematoma. Had surgery on 01/28/2021, is doing well and has been released from Neuro now. He doesn't remember exactly what happened about it all.  Just remembers having severe headache for a few weeks, treated for migraine until it became so severe he ended up going to the ER and was admitted.  Feels like he is doing well as far as that is concerned.  Does not enjoy much of anything but there is not much to do.  Energy and motivation are fine.  He sleeps pretty good.  Not isolating.  Appetite is normal and weight is stable.  No suicidal or  homicidal thoughts.  Does report continued irritability.  Not really much worse than it has been.  He gets aggravated easily especially with his wife.  He is not doing much of anything for the anxiety.  His wife does not like him to take a benzodiazepine and he no longer drinks or smokes pot so he just deals with it.  No increased energy with decreased need for sleep.  No impulsivity or risky behaviors.  No grandiosity.  No paranoia or hallucinations.  He has tremor hands bilat, can't write, has to use both hands to brush his teeth even.  Unable to hold a fork or definitely a spoon.  The food will fall off and that gets frustrating.  He does not report any other abnormal movements.  Review of Systems  Constitutional: Negative.   HENT: Negative.    Eyes: Negative.   Respiratory: Negative.    Cardiovascular: Negative.   Gastrointestinal: Negative.   Genitourinary: Negative.   Musculoskeletal: Negative.   Skin: Negative.   Neurological: Negative.   Endo/Heme/Allergies: Negative.   Psychiatric/Behavioral:  Negative for depression, hallucinations, memory loss, substance abuse and suicidal ideas. The patient is nervous/anxious. The patient does not have insomnia.        Irritability    Individual Medical History/ Review of Systems: Changes? :Yes   see above  Past medications for mental health diagnoses include: Zoloft, Prozac, Effexor XR, Lexapro, Depakote, Lamictal, Wellbutrin, Xanax, Ambien, Sonata, Klonopin  Allergies: Patient has no known allergies.  Current Medications:  Current  Outpatient Medications:    atorvastatin (LIPITOR) 80 MG tablet, TAKE 1 TABLET EVERY DAY, Disp: 90 tablet, Rfl: 3   divalproex (DEPAKOTE) 500 MG DR tablet, Take 4 tablets (2,000 mg total) by mouth at bedtime., Disp: 360 tablet, Rfl: 1   escitalopram (LEXAPRO) 20 MG tablet, TAKE 1 TABLET EVERY DAY, Disp: 90 tablet, Rfl: 1   ezetimibe (ZETIA) 10 MG tablet, Take 1 tablet (10 mg total) by mouth daily., Disp: 90  tablet, Rfl: 1   isosorbide mononitrate (IMDUR) 30 MG 24 hr tablet, Take 0.5 tablets (15 mg total) by mouth daily., Disp: 45 tablet, Rfl: 3   lamoTRIgine (LAMICTAL) 100 MG tablet, TAKE 1 AND 1/2 TABLETS EVERY DAY, Disp: 135 tablet, Rfl: 0   metoprolol tartrate (LOPRESSOR) 25 MG tablet, Take 1 tablet (25 mg total) by mouth 2 (two) times daily., Disp: 180 tablet, Rfl: 3   montelukast (SINGULAIR) 10 MG tablet, Take 1 tablet (10 mg total) by mouth daily., Disp: 90 tablet, Rfl: 3   nitroGLYCERIN (NITROSTAT) 0.4 MG SL tablet, Place 1 tablet (0.4 mg total) under the tongue every 5 (five) minutes as needed for chest pain., Disp: 25 tablet, Rfl: 3   pantoprazole (PROTONIX) 40 MG tablet, TAKE 1 TABLET EVERY DAY, Disp: 90 tablet, Rfl: 2   acetaminophen (TYLENOL) 500 MG tablet, Take 500 mg by mouth every 6 (six) hours as needed for mild pain., Disp: , Rfl:    HYDROcodone-acetaminophen (NORCO/VICODIN) 5-325 MG tablet, Take 1 tablet by mouth every 6 (six) hours as needed for moderate pain. (Patient not taking: Reported on 06/12/2021), Disp: 20 tablet, Rfl: 0   ipratropium (ATROVENT) 0.06 % nasal spray, Place 2 sprays into both nostrils 3 (three) times daily as needed for congestion. (Patient not taking: Reported on 06/12/2021), Disp: , Rfl:    ondansetron (ZOFRAN-ODT) 8 MG disintegrating tablet, Take 8 mg by mouth every 8 (eight) hours as needed for nausea/vomiting. (Patient not taking: Reported on 06/12/2021), Disp: , Rfl:   Current Facility-Administered Medications:    0.9 %  sodium chloride infusion, 500 mL, Intravenous, Once, Jackquline Denmark, MD Medication Side Effects: none  Family Medical/ Social History: Changes? no  MENTAL HEALTH EXAM:  There were no vitals taken for this visit.There is no height or weight on file to calculate BMI.  General Appearance:  unable to asses  Eye Contact:   unable to assess  Speech:  Clear and Coherent and Normal Rate  Volume:  Normal  Mood:  Irritable  Affect:   unable to  assess  Thought Process:  Goal Directed and Descriptions of Associations: Intact  Orientation:  Full (Time, Place, and Person)  Thought Content: Logical   Suicidal Thoughts:  No  Homicidal Thoughts:  No  Memory:  WNL  Judgement:  Good  Insight:  Good  Psychomotor Activity:   unable to assess  Concentration:  Concentration: Good  Recall:  Good  Fund of Knowledge: Good  Language: Good  Assets:  Desire for Improvement  ADL's:  Intact  Cognition: WNL  Prognosis:  Good   Labs  01/28/2021 Platelet count 359 03/18/2021 LFTs were normal No recent Depakote level.  DIAGNOSES:    ICD-10-CM   1. Tremor  R25.1     2. Bipolar I disorder ([place])  F31.9     3. Generalized anxiety disorder  F41.1     4. S/P craniotomy for subdural hematoma  Z98.890        Receiving Psychotherapy: Yes with Dr. [name]   RECOMMENDATIONS:  PDMP reviewed. I provided 25 minutes of non-face-to-face time during this encounter, including time spent before and after the visit in records review, medical decision making, and charting.  I am glad to hear that he has recovered well from the subdural hematoma. As far as the psychiatric medications, we agreed to make no changes at this point.  I would like to see him in person to evaluate the tremor and will treat that.  Increasing the Depakote might be helpful for the irritability but I do not want to make the tremor even more difficult to treat, even though that is not likely to happen.  He understands and would like to be seen in the office. In the meantime, I will pull his old paper chart if at all possible.  He thinks he may have taken something for tremor years ago but not sure.  He has not had a tremor in years. Cont Depakote DR 500 mg, 4 p.o. nightly. Cont. Lamictal 100 mg, 1-1/2 pills nightly. Continue Lexapro 20 mg p.o. daily. Order Depakote level at next visit. Continue therapy with Dr. [name]. Return in 4 weeks in office.  Donnal Moat,  [name]

## 2021-06-12 NOTE — Progress Notes (Signed)
Cardiology Office Note:    Date:  06/14/2021   ID:  Derrick [name] Jan 13, 1956, MRN [id number]  PCP:  [name], [name]   [facility] Providers Cardiologist:  None {  Referring MD: [name], [name]    History of Present Illness:    Derrick [name] is a 65 y.o. male with a hx of CAD s/p inferior STEMI with DES to RCA in 2004, MVP with mild-to-moderate MR, tobacco abuse, bipolar disorder, OSA and HLD who was previously followed by Truitt Merle, NP who now presents to clinic for follow-up.  Last seen by Cecille Rubin in 06/2020 where he was doing well from a CV standpoint.  Today, the patient states he is doing overall well. No chest pain, SOB, nausea or vomiting. Patient is active and walks with no exertional symptoms. No LE edema, orthopnea or PND. Tolerating medications without issues. Blood pressure well controlled. Continues to smoke 4 cigarettes per day but is trying to quit.  Past Medical History:  Diagnosis Date   Anxiety    Bipolar 1 disorder ([place])    Bipolar disorder ([place]) 10/05/2009   Qualifier: Diagnosis of  By: [name]     BPH (benign prostatic hypertrophy)    CAD (coronary artery disease), native coronary artery    a. DES x 2 to RCA in 2004 b. 02/11/17 PCI w/DES x1 to mRCA   Coronary atherosclerosis 10/05/2009   Qualifier: Diagnosis of  By: [name]   ANGIOGRAPHIC DATA:  1. Left main coronary artery was free of critical disease.  2. The LAD coursed to the apex. There is mild luminal irregularity  including mild irregularity of the diagonal. No high grade areas of  stenosis were noted. There was faint collateralization of the distal  right coronary circulation.  3. The circumflex provided two major m   Depression    Dyspnea 09/20/2012   Essential and other specified forms of tremor 02/05/2013   HYPERCHOLESTEROLEMIA 10/05/2009   Qualifier: Diagnosis of  By: [name]     Hyperlipidemia    Memory loss 02/05/2013   Mitral regurgitation and aortic  stenosis    MITRAL STENOSIS/ INSUFFICIENCY, NON-RHEUMATIC 01/06/2011   Qualifier: Diagnosis of  By: [name], MD, [name] Study Conclusions  - Left ventricle: The cavity size was normal. Wall thickness was increased in a pattern of mild LVH. Systolic function was normal. The estimated ejection fraction was in the range of 55% to 60%. Wall motion was normal; there were no regional wall motion abnormalities. Left ventricular diastolic function parameters were   MVP (mitral valve prolapse)    Myocardial infarction ([place])    Obstructive sleep apnea 02/05/2013   OSA (obstructive sleep apnea) 03/22/2013   Parasomnia 02/05/2013   Sleep apnea    Sleep disorder with cognitive complaints 10/26/2018   Unspecified hereditary and idiopathic peripheral neuropathy 02/05/2013    Past Surgical History:  Procedure Laterality Date   CORONARY STENT INTERVENTION N/A 02/12/2017   Procedure: Coronary Stent Intervention;  Surgeon: [name], MD;  Location: [place] [facility];  Service: Cardiovascular;  Laterality: N/A;   CRANIOTOMY Left 01/28/2021   Procedure: CRANIOTOMY HEMATOMA EVACUATION SUBDURAL;  Surgeon: [name], MD;  Location: [place];  Service: Neurosurgery;  Laterality: Left;   LEFT HEART CATH AND CORONARY ANGIOGRAPHY N/A 02/12/2017   Procedure: Left Heart Cath and Coronary Angiography;  Surgeon: [name], MD;  Location: [place] [facility];  Service: Cardiovascular;  Laterality: N/A;  Current Medications: Current Meds  Medication Sig   atorvastatin (LIPITOR) 80 MG tablet TAKE 1 TABLET EVERY DAY   divalproex (DEPAKOTE) 500 MG DR tablet Take 4 tablets (2,000 mg total) by mouth at bedtime.   escitalopram (LEXAPRO) 20 MG tablet TAKE 1 TABLET EVERY DAY   ezetimibe (ZETIA) 10 MG tablet Take 1 tablet (10 mg total) by mouth daily.   isosorbide mononitrate (IMDUR) 30 MG 24 hr tablet Take 0.5 tablets (15 mg total) by mouth daily.   lamoTRIgine (LAMICTAL) 100 MG tablet TAKE 1 AND 1/2 TABLETS  EVERY DAY   metoprolol tartrate (LOPRESSOR) 25 MG tablet Take 1 tablet (25 mg total) by mouth 2 (two) times daily.   montelukast (SINGULAIR) 10 MG tablet Take 1 tablet (10 mg total) by mouth daily.   nitroGLYCERIN (NITROSTAT) 0.4 MG SL tablet Place 1 tablet (0.4 mg total) under the tongue every 5 (five) minutes as needed for chest pain.   pantoprazole (PROTONIX) 40 MG tablet TAKE 1 TABLET EVERY DAY   Current Facility-Administered Medications for the 06/14/21 encounter (Office Visit) with [name], MD  Medication   0.9 %  sodium chloride infusion     Allergies:   Patient has no known allergies.   Social History   Socioeconomic History   Marital status: Married    Spouse name: Not on file   Number of children: Not on file   Years of education: Not on file   Highest education level: Not on file  Occupational History   Not on file  Tobacco Use   Smoking status: Some Days    Packs/day: 0.10    Pack years: 0.00    Types: Cigarettes    Last attempt to quit: 02/05/2003    Years since quitting: 18.3   Smokeless tobacco: Never   Tobacco comments:    3 cigs per day  Vaping Use   Vaping Use: Never used  Substance and Sexual Activity   Alcohol use: Not Currently    Comment:     Drug use: No   Sexual activity: Not on file  Other Topics Concern   Not on file  Social History Narrative   Not on file   Social Determinants of Health   Financial Resource Strain: Not on file  Food Insecurity: Not on file  Transportation Needs: Not on file  Physical Activity: Not on file  Stress: Not on file  Social Connections: Not on file     Family History: The patient's family history includes COPD in his father; Colon cancer in his sister; Heart disease in his brother and mother. There is no history of Esophageal cancer, Rectal cancer, or Stomach cancer.  ROS:   Please see the history of present illness.    Review of Systems  Constitutional:  Negative for chills and fever.  HENT:   Negative for sore throat.   Eyes:  Negative for blurred vision.  Respiratory:  Negative for sputum production.   Cardiovascular:  Negative for chest pain, palpitations, orthopnea, claudication, leg swelling and PND.  Gastrointestinal:  Negative for nausea and vomiting.  Genitourinary:  Negative for flank pain.  Musculoskeletal:  Negative for falls.  Neurological:  Negative for dizziness and loss of consciousness.  Psychiatric/Behavioral:  Negative for substance abuse.     EKGs/Labs/Other Studies Reviewed:    The following studies were reviewed today: ECHO Study Conclusions 09/2018 - Left ventricle: The cavity size was normal. Systolic function was    normal. The estimated ejection fraction was in  the range of 50%    to 55%. Wall motion was normal; there were no regional wall    motion abnormalities. Doppler parameters are consistent with    abnormal left ventricular relaxation (grade 1 diastolic    dysfunction). Doppler parameters are consistent with    indeterminate ventricular filling pressure.  - Aortic valve: Transvalvular velocity was within the normal range.    There was no stenosis. There was no regurgitation.  - Mitral valve: Mild prolapse, involving the anterior leaflet and    the posterior leaflet. Transvalvular velocity was within the    normal range. There was no evidence for stenosis. There was mild    regurgitation.  - Left atrium: The atrium was moderately dilated.  - Right ventricle: The cavity size was normal. Wall thickness was    normal. Systolic function was normal.  - Atrial septum: No defect or patent foramen ovale was identified.  - Tricuspid valve: There was trivial regurgitation.  - Pulmonary arteries: Systolic pressure was within the normal    range. PA peak pressure: 19 mm Hg (S).   ------------------------------------------------------------------- CARDIAC MRI IMPRESSION 10/2017: 1. Normal LV size with wall motion abnormalities noted above. EF 47%.  Delayed enhancement images suggestive of prior infarction in the RCA territory.   2.  Normal RV size and systolic function.   3. Mitral valve prolapse with mild to moderate mitral regurgitation.   Electronically Signed   By: [name] M.D.   On: 10/23/2017 16:43   Myoview Study Highlights 08/2017   Nuclear stress EF: 37%. Blood pressure demonstrated a normal response to exercise. There was no ST segment deviation noted during stress. No T wave inversion was noted during stress. Defect 1: There is a small defect of moderate severity present in the basal inferior and mid inferior location. Findings consistent with prior myocardial infarction. No ischemia. The left ventricular ejection fraction is moderately decreased (30-44%).   Coronary Stent Intervention 02/2017  Conclusion     Mid RCA lesion, 90 %stenosed. A STENT RESOLUTE ONYX 4.0X12 drug eluting stent was successfully placed. Prox RCA to Mid RCA lesion, 90 %stenosed. Post intervention, there is a 0% residual stenosis.   Successful stenting of the mid RCA with DES for in stent restenosis.   Plan: DAPT for one year with ASA and Plavix. May be suitable for same day discharge.     EKG:  EKG not performed today  Recent Labs: 03/18/2021: ALT 9; BUN 10; Creatinine, Ser 0.82; Hemoglobin 12.9; Platelets 197; Potassium 5.0; Sodium 140; TSH 1.950  Recent Lipid Panel    Component Value Date/Time   CHOL 95 (L) 03/07/2021 1145   TRIG 79 03/07/2021 1145   HDL 39 (L) 03/07/2021 1145   CHOLHDL 2.4 03/07/2021 1145   CHOLHDL 4 10/18/2014 1418   VLDL 39.8 10/18/2014 1418   LDLCALC 40 03/07/2021 1145          Physical Exam:    VS:  BP 120/80   Pulse 66   Ht 5\' 11"  (1.803 m)   Wt 174 lb 9.6 oz (79.2 kg)   SpO2 95%   BMI 24.35 kg/m     Wt Readings from Last 3 Encounters:  06/14/21 174 lb 9.6 oz (79.2 kg)  04/11/21 176 lb 6 oz (80 kg)  03/18/21 176 lb (79.8 kg)     GEN:  Well nourished, well developed in no acute  distress HEENT: Normal NECK: No JVD; No carotid bruits CARDIAC: RRR, no murmurs, rubs, gallops RESPIRATORY:  Clear  to auscultation without rales, wheezing or rhonchi  ABDOMEN: Soft, non-tender, non-distended MUSCULOSKELETAL:  No edema; No deformity  SKIN: Warm and dry NEUROLOGIC:  Alert and oriented x 3 PSYCHIATRIC:  Normal affect   ASSESSMENT:    1. Coronary artery disease involving native coronary artery of native heart without angina pectoris   2. Mitral valve prolapse   3. Pure hypercholesterolemia   4. Primary hypertension   5. TOBACCO ABUSE    PLAN:    In order of problems listed above:  #CAD with history of STEMI s/p DES to RCA: Occurred in 02/2017 s/p successful DES to RCI. No ischemia on myoview in 2018. EF improved to 50-55%.  -Continue imdur 30mg  daily -Continue metop tartrate 25mg  BID; declined switching to succinate today -Continue zetia 10mg  daily -Continue atorvastatin 80mg  daily  #HTN: Well controlled and at goal <120s/80s. -Continue metop tartrate 25mg  BID; does not want to change to succinate today   #MVP: Mild MR on TTE in 2019. -Repeat TTE for surveillance  #HLD: Controlled with LDL 40 in 02/2021. -Continue zetia 10mg  daily -Continue atorvastatin 80mg  daily  #Tobacco Abuse: Currently trying to cut back. Only smoking 4 cigarettes per day. -Encourage complete tobacco cessation  #Bipolar Disease: -Management per psych     Medication Adjustments/Labs and Tests Ordered: Current medicines are reviewed at length with the patient today.  Concerns regarding medicines are outlined above.  Orders Placed This Encounter  Procedures   ECHOCARDIOGRAM COMPLETE   No orders of the defined types were placed in this encounter.   Patient Instructions  Medication Instructions:  Your physician recommends that you continue on your current medications as directed. Please refer to the Current Medication list given to you today.  *If you need a refill on your  cardiac medications before your next appointment, please call your pharmacy*   Lab Work: If you have labs (blood work) drawn today and your tests are completely normal, you will receive your results only by: [place] (if you have MyChart) OR A paper copy in the mail If you have any lab test that is abnormal or we need to change your treatment, we will call you to review the results.  Testing/Procedures: Your physician has requested that you have an echocardiogram. Echocardiography is a painless test that uses sound waves to create images of your heart. It provides your doctor with information about the size and shape of your heart and how well your heart's chambers and valves are working. This procedure takes approximately one hour. There are no restrictions for this procedure.  Follow-Up: At [facility], you and your health needs are our priority.  As part of our continuing mission to provide you with exceptional heart care, we have created designated Provider Care Teams.  These Care Teams include your primary Cardiologist (physician) and Advanced Practice Providers (APPs -  Physician Assistants and Nurse Practitioners) who all work together to provide you with the care you need, when you need it.  We recommend signing up for the patient portal called "MyChart".  Sign up information is provided on this After Visit Summary.  MyChart is used to connect with patients for Virtual Visits (Telemedicine).  Patients are able to view lab/test results, encounter notes, upcoming appointments, etc.  Non-urgent messages can be sent to your provider as well.   To learn more about what you can do with MyChart, go to [email or web address].    Your next appointment:   6 month(s)  The format for your next appointment:  In Person  Provider:   You may see Dr. Johney Frame or one of the following Advanced Practice Providers on your designated Care Team:   Richardson Dopp, [name] Robbie Lis, [place]     Signed, [name], MD  06/14/2021 11:41 AM

## 2021-06-14 ENCOUNTER — Other Ambulatory Visit: Payer: Self-pay

## 2021-06-14 ENCOUNTER — Encounter (HOSPITAL_BASED_OUTPATIENT_CLINIC_OR_DEPARTMENT_OTHER): Payer: Self-pay | Admitting: Cardiology

## 2021-06-14 ENCOUNTER — Ambulatory Visit (HOSPITAL_BASED_OUTPATIENT_CLINIC_OR_DEPARTMENT_OTHER): Payer: Medicare HMO | Admitting: Cardiology

## 2021-06-14 VITALS — BP 120/80 | HR 66 | Ht 71.0 in | Wt 174.6 lb

## 2021-06-14 DIAGNOSIS — F17200 Nicotine dependence, unspecified, uncomplicated: Secondary | ICD-10-CM

## 2021-06-14 DIAGNOSIS — I2510 Atherosclerotic heart disease of native coronary artery without angina pectoris: Secondary | ICD-10-CM

## 2021-06-14 DIAGNOSIS — I10 Essential (primary) hypertension: Secondary | ICD-10-CM | POA: Diagnosis not present

## 2021-06-14 DIAGNOSIS — I341 Nonrheumatic mitral (valve) prolapse: Secondary | ICD-10-CM | POA: Diagnosis not present

## 2021-06-14 DIAGNOSIS — E7800 Pure hypercholesterolemia, unspecified: Secondary | ICD-10-CM | POA: Diagnosis not present

## 2021-06-14 NOTE — Patient Instructions (Signed)
Medication Instructions:  Your physician recommends that you continue on your current medications as directed. Please refer to the Current Medication list given to you today.  *If you need a refill on your cardiac medications before your next appointment, please call your pharmacy*   Lab Work: If you have labs (blood work) drawn today and your tests are completely normal, you will receive your results only by: [place] (if you have MyChart) OR A paper copy in the mail If you have any lab test that is abnormal or we need to change your treatment, we will call you to review the results.  Testing/Procedures: Your physician has requested that you have an echocardiogram. Echocardiography is a painless test that uses sound waves to create images of your heart. It provides your doctor with information about the size and shape of your heart and how well your heart's chambers and valves are working. This procedure takes approximately one hour. There are no restrictions for this procedure.  Follow-Up: At [facility], you and your health needs are our priority.  As part of our continuing mission to provide you with exceptional heart care, we have created designated Provider Care Teams.  These Care Teams include your primary Cardiologist (physician) and Advanced Practice Providers (APPs -  Physician Assistants and Nurse Practitioners) who all work together to provide you with the care you need, when you need it.  We recommend signing up for the patient portal called "MyChart".  Sign up information is provided on this After Visit Summary.  MyChart is used to connect with patients for Virtual Visits (Telemedicine).  Patients are able to view lab/test results, encounter notes, upcoming appointments, etc.  Non-urgent messages can be sent to your provider as well.   To learn more about what you can do with MyChart, go to [email or web address].    Your next appointment:   6 month(s)  The format for  your next appointment:   In Person  Provider:   You may see Dr. Johney Frame or one of the following Advanced Practice Providers on your designated Care Team:   Richardson Dopp, [name] [place], [place]

## 2021-06-19 ENCOUNTER — Ambulatory Visit: Payer: Medicare HMO | Admitting: Family Medicine

## 2021-06-24 ENCOUNTER — Telehealth: Payer: Self-pay | Admitting: *Deleted

## 2021-06-24 NOTE — Telephone Encounter (Signed)
Patient transferred to nurse triage line. Patient complains of upset stomach, light headed, dizziness, weakness.  I consulted clinical nurse supervisor Lynnea Ferrier, LPN. She asked if patient had the means to take a blood pressure reading. She advised call EMS and have them come out and take some vitals, EKG.  We advised patient that you don't get charged unless you are transported. Patient stated that he would call.

## 2021-06-26 ENCOUNTER — Encounter: Payer: Self-pay | Admitting: Family Medicine

## 2021-06-26 ENCOUNTER — Ambulatory Visit (INDEPENDENT_AMBULATORY_CARE_PROVIDER_SITE_OTHER): Payer: Medicare HMO | Admitting: Family Medicine

## 2021-06-26 ENCOUNTER — Other Ambulatory Visit: Payer: Self-pay

## 2021-06-26 VITALS — BP 122/78 | HR 78 | Temp 98.1°F | Resp 20 | Ht 71.0 in | Wt 172.0 lb

## 2021-06-26 DIAGNOSIS — I2510 Atherosclerotic heart disease of native coronary artery without angina pectoris: Secondary | ICD-10-CM

## 2021-06-26 DIAGNOSIS — Z8679 Personal history of other diseases of the circulatory system: Secondary | ICD-10-CM | POA: Insufficient documentation

## 2021-06-26 DIAGNOSIS — I10 Essential (primary) hypertension: Secondary | ICD-10-CM | POA: Diagnosis not present

## 2021-06-26 DIAGNOSIS — F3170 Bipolar disorder, currently in remission, most recent episode unspecified: Secondary | ICD-10-CM | POA: Diagnosis not present

## 2021-06-26 DIAGNOSIS — I5022 Chronic systolic (congestive) heart failure: Secondary | ICD-10-CM | POA: Diagnosis not present

## 2021-06-26 DIAGNOSIS — I341 Nonrheumatic mitral (valve) prolapse: Secondary | ICD-10-CM | POA: Diagnosis not present

## 2021-06-26 DIAGNOSIS — E7800 Pure hypercholesterolemia, unspecified: Secondary | ICD-10-CM

## 2021-06-26 NOTE — Progress Notes (Signed)
Established Patient Office Visit  Subjective:  Patient ID: Derrick [name], male    DOB: Oct 06, 1956  Age: 65 y.o. MRN: [id number]  CC:  Chief Complaint  Patient presents with   Medical Management of Chronic Issues    HPI Derrick [name] presents for chronic follow up.  HTN Complaint with meds - Yes Current Medications - lopressor Exercising Regularly - No Watching Salt intake - Yes Pertinent ROS:  Headache - No Fatigue - No Visual Disturbances - No Chest pain - No Dyspnea - No Palpitations - No LE edema - No They report good compliance with medications and can restate their regimen by memory. No medication side effects.  Family, social, and smoking history reviewed.   BP Readings from Last 3 Encounters:  06/26/21 122/78  06/14/21 120/80  04/11/21 103/66   CMP Latest Ref Rng & Units 03/18/2021 01/28/2021 01/28/2021  Glucose 65 - 99 mg/dL 106(H) 153(H) 132(H)  BUN 8 - 27 mg/dL '10 17 13  ' Creatinine 0.76 - 1.27 mg/dL 0.82 0.88 0.90  Sodium 134 - 144 mmol/L 140 134(L) 140  Potassium 3.5 - 5.2 mmol/L 5.0 3.6 4.4  Chloride 96 - 106 mmol/L 102 99 102  CO2 20 - 29 mmol/L '22 26 22  ' Calcium 8.6 - 10.2 mg/dL 9.1 9.6 9.8  Total Protein 6.0 - 8.5 g/dL 6.5 7.8 6.8  Total Bilirubin 0.0 - 1.2 mg/dL 0.3 0.6 0.5  Alkaline Phos 44 - 121 IU/L 55 88 109  AST 0 - 40 IU/L '11 23 17  ' ALT 0 - 44 IU/L 9 41 39      Past Medical History:  Diagnosis Date   Anxiety    Bipolar 1 disorder ([place])    Bipolar disorder ([place]) 10/05/2009   Qualifier: Diagnosis of  By: [name]     BPH (benign prostatic hypertrophy)    CAD (coronary artery disease), native coronary artery    a. DES x 2 to RCA in 2004 b. 02/11/17 PCI w/DES x1 to mRCA   Coronary atherosclerosis 10/05/2009   Qualifier: Diagnosis of  By: [name]   ANGIOGRAPHIC DATA:  1. Left main coronary artery was free of critical disease.  2. The LAD coursed to the apex. There is mild luminal irregularity  including mild irregularity of  the diagonal. No high grade areas of  stenosis were noted. There was faint collateralization of the distal  right coronary circulation.  3. The circumflex provided two major m   Depression    Dyspnea 09/20/2012   Essential and other specified forms of tremor 02/05/2013   HYPERCHOLESTEROLEMIA 10/05/2009   Qualifier: Diagnosis of  By: [name]     Hyperlipidemia    Memory loss 02/05/2013   Mitral regurgitation and aortic stenosis    MITRAL STENOSIS/ INSUFFICIENCY, NON-RHEUMATIC 01/06/2011   Qualifier: Diagnosis of  By: [name], MD, [name] Study Conclusions  - Left ventricle: The cavity size was normal. Wall thickness was increased in a pattern of mild LVH. Systolic function was normal. The estimated ejection fraction was in the range of 55% to 60%. Wall motion was normal; there were no regional wall motion abnormalities. Left ventricular diastolic function parameters were   MVP (mitral valve prolapse)    Myocardial infarction ([place])    Obstructive sleep apnea 02/05/2013   OSA (obstructive sleep apnea) 03/22/2013   Parasomnia 02/05/2013   Sleep apnea    Sleep disorder with cognitive complaints 10/26/2018   Unspecified hereditary and idiopathic peripheral neuropathy 02/05/2013  Past Surgical History:  Procedure Laterality Date   CORONARY STENT INTERVENTION N/A 02/12/2017   Procedure: Coronary Stent Intervention;  Surgeon: [name], MD;  Location: [facility];  Service: Cardiovascular;  Laterality: N/A;   CRANIOTOMY Left 01/28/2021   Procedure: CRANIOTOMY HEMATOMA EVACUATION SUBDURAL;  Surgeon: [name], MD;  Location: [facility];  Service: Neurosurgery;  Laterality: Left;   LEFT HEART CATH AND CORONARY ANGIOGRAPHY N/A 02/12/2017   Procedure: Left Heart Cath and Coronary Angiography;  Surgeon: [name], MD;  Location: [facility];  Service: Cardiovascular;  Laterality: N/A;    Family History  Problem Relation Age of Onset   Heart disease Mother    COPD  Father    Colon cancer Sister    Heart disease Brother    Esophageal cancer Neg Hx    Rectal cancer Neg Hx    Stomach cancer Neg Hx     Social History   Socioeconomic History   Marital status: Married    Spouse name: Not on file   Number of children: Not on file   Years of education: Not on file   Highest education level: Not on file  Occupational History   Not on file  Tobacco Use   Smoking status: Some Days    Packs/day: 0.10    Pack years: 0.00    Types: Cigarettes    Last attempt to quit: 02/05/2003    Years since quitting: 18.4   Smokeless tobacco: Never   Tobacco comments:    3 cigs per day  Vaping Use   Vaping Use: Never used  Substance and Sexual Activity   Alcohol use: Not Currently    Comment:     Drug use: No   Sexual activity: Not on file  Other Topics Concern   Not on file  Social History Narrative   Not on file   Social Determinants of Health   Financial Resource Strain: Not on file  Food Insecurity: Not on file  Transportation Needs: Not on file  Physical Activity: Not on file  Stress: Not on file  Social Connections: Not on file  Intimate Partner Violence: Not on file    Outpatient Medications Prior to Visit  Medication Sig Dispense Refill   atorvastatin (LIPITOR) 80 MG tablet TAKE 1 TABLET EVERY DAY 90 tablet 3   divalproex (DEPAKOTE) 500 MG DR tablet Take 4 tablets (2,000 mg total) by mouth at bedtime. 360 tablet 1   escitalopram (LEXAPRO) 20 MG tablet TAKE 1 TABLET EVERY DAY 90 tablet 1   ezetimibe (ZETIA) 10 MG tablet Take 1 tablet (10 mg total) by mouth daily. 90 tablet 1   isosorbide mononitrate (IMDUR) 30 MG 24 hr tablet Take 0.5 tablets (15 mg total) by mouth daily. 45 tablet 3   lamoTRIgine (LAMICTAL) 100 MG tablet TAKE 1 AND 1/2 TABLETS EVERY DAY 135 tablet 0   metoprolol tartrate (LOPRESSOR) 25 MG tablet Take 1 tablet (25 mg total) by mouth 2 (two) times daily. 180 tablet 3   montelukast (SINGULAIR) 10 MG tablet Take 1 tablet (10  mg total) by mouth daily. 90 tablet 3   nitroGLYCERIN (NITROSTAT) 0.4 MG SL tablet Place 1 tablet (0.4 mg total) under the tongue every 5 (five) minutes as needed for chest pain. 25 tablet 3   pantoprazole (PROTONIX) 40 MG tablet TAKE 1 TABLET EVERY DAY 90 tablet 2   Facility-Administered Medications Prior to Visit  Medication Dose Route Frequency Provider Last Rate Last  Admin   0.9 %  sodium chloride infusion  500 mL Intravenous Once Jackquline Denmark, MD        No Known Allergies  ROS Review of Systems As per HPI.    Objective:    Physical Exam Vitals and nursing note reviewed.  Constitutional:      General: He is not in acute distress.    Appearance: He is not ill-appearing, toxic-appearing or diaphoretic.  HENT:     Head: Normocephalic and atraumatic.  Neck:     Vascular: No carotid bruit.  Cardiovascular:     Rate and Rhythm: Normal rate and regular rhythm.     Heart sounds: Normal heart sounds. No murmur heard. Pulmonary:     Effort: Pulmonary effort is normal. No respiratory distress.     Breath sounds: Normal breath sounds.  Musculoskeletal:     Cervical back: Neck supple. No tenderness.     Right lower leg: No edema.     Left lower leg: No edema.  Skin:    General: Skin is warm and dry.  Neurological:     General: No focal deficit present.     Mental Status: He is alert and oriented to person, place, and time.  Psychiatric:        Mood and Affect: Mood normal.        Behavior: Behavior normal.    BP 122/78   Pulse 78   Temp 98.1 F (36.7 C) (Temporal)   Resp 20   Ht '5\' 11"'  (1.803 m)   Wt 172 lb (78 kg)   SpO2 94%   BMI 23.99 kg/m  Wt Readings from Last 3 Encounters:  06/26/21 172 lb (78 kg)  06/14/21 174 lb 9.6 oz (79.2 kg)  04/11/21 176 lb 6 oz (80 kg)     There are no preventive care reminders to display for this patient.  There are no preventive care reminders to display for this patient.  Lab Results  Component Value Date   TSH 1.950  03/18/2021   Lab Results  Component Value Date   WBC 6.4 03/18/2021   HGB 12.9 (L) 03/18/2021   HCT 37.2 (L) 03/18/2021   MCV 94 03/18/2021   PLT 197 03/18/2021   Lab Results  Component Value Date   NA 140 03/18/2021   K 5.0 03/18/2021   CO2 22 03/18/2021   GLUCOSE 106 (H) 03/18/2021   BUN 10 03/18/2021   CREATININE 0.82 03/18/2021   BILITOT 0.3 03/18/2021   ALKPHOS 55 03/18/2021   AST 11 03/18/2021   ALT 9 03/18/2021   PROT 6.5 03/18/2021   ALBUMIN 3.7 (L) 03/18/2021   CALCIUM 9.1 03/18/2021   ANIONGAP 9 01/28/2021   EGFR 99 03/18/2021   GFR 90.20 10/18/2014   Lab Results  Component Value Date   CHOL 95 (L) 03/07/2021   Lab Results  Component Value Date   HDL 39 (L) 03/07/2021   Lab Results  Component Value Date   LDLCALC 40 03/07/2021   Lab Results  Component Value Date   TRIG 79 03/07/2021   Lab Results  Component Value Date   CHOLHDL 2.4 03/07/2021   No results found for: HGBA1C    Assessment & Plan:   Curt was seen today for medical management of chronic issues.  Diagnoses and all orders for this visit:  Primary hypertension Well controlled on current regimen. Labs pending.  -     CBC with Differential/Platelet -     CMP14+EGFR -  Lipid panel  Hypercholesteremia Well controlled on current regimen. Labs pending.  -     Lipid panel  Coronary artery disease involving native coronary artery of native heart without angina pectoris Managed by cardiology. Well controlled.  -     CBC with Differential/Platelet -     CMP14+EGFR -     Lipid panel  Chronic systolic HF Mitral valve prolapse Managed by cardiology. Has echo scheduled next month.   Bipolar disorder in partial remission, most recent episode unspecified type [facility]) Managed by [facility]. Denies plan to harm himself.   Follow-up: Return in about 6 months (around 12/27/2021) for chronic follow up.   The patient indicates understanding of these issues and agrees with the plan.  [name], [name]

## 2021-06-27 ENCOUNTER — Other Ambulatory Visit (HOSPITAL_COMMUNITY): Payer: Medicare HMO

## 2021-06-27 LAB — CBC WITH DIFFERENTIAL/PLATELET
Basophils Absolute: 0 10*3/uL (ref 0.0–0.2)
Basos: 0 %
EOS (ABSOLUTE): 0.1 10*3/uL (ref 0.0–0.4)
Eos: 1 %
Hematocrit: 40.9 % (ref 37.5–51.0)
Hemoglobin: 14.2 g/dL (ref 13.0–17.7)
Immature Grans (Abs): 0.1 10*3/uL (ref 0.0–0.1)
Immature Granulocytes: 1 %
Lymphocytes Absolute: 1.6 10*3/uL (ref 0.7–3.1)
Lymphs: 21 %
MCH: 32.3 pg (ref 26.6–33.0)
MCHC: 34.7 g/dL (ref 31.5–35.7)
MCV: 93 fL (ref 79–97)
Monocytes Absolute: 1.4 10*3/uL — ABNORMAL HIGH (ref 0.1–0.9)
Monocytes: 18 %
Neutrophils Absolute: 4.6 10*3/uL (ref 1.4–7.0)
Neutrophils: 59 %
Platelets: 146 10*3/uL — ABNORMAL LOW (ref 150–450)
RBC: 4.39 x10E6/uL (ref 4.14–5.80)
RDW: 13.6 % (ref 11.6–15.4)
WBC: 7.7 10*3/uL (ref 3.4–10.8)

## 2021-06-27 LAB — CMP14+EGFR
ALT: 20 IU/L (ref 0–44)
AST: 20 IU/L (ref 0–40)
Albumin/Globulin Ratio: 2.2 (ref 1.2–2.2)
Albumin: 4.4 g/dL (ref 3.8–4.8)
Alkaline Phosphatase: 56 IU/L (ref 44–121)
BUN/Creatinine Ratio: 9 — ABNORMAL LOW (ref 10–24)
BUN: 8 mg/dL (ref 8–27)
Bilirubin Total: 0.4 mg/dL (ref 0.0–1.2)
CO2: 24 mmol/L (ref 20–29)
Calcium: 9.5 mg/dL (ref 8.6–10.2)
Chloride: 103 mmol/L (ref 96–106)
Creatinine, Ser: 0.87 mg/dL (ref 0.76–1.27)
Globulin, Total: 2 g/dL (ref 1.5–4.5)
Glucose: 94 mg/dL (ref 65–99)
Potassium: 5 mmol/L (ref 3.5–5.2)
Sodium: 141 mmol/L (ref 134–144)
Total Protein: 6.4 g/dL (ref 6.0–8.5)
eGFR: 97 mL/min/{1.73_m2} (ref >59)

## 2021-06-27 LAB — LIPID PANEL
Chol/HDL Ratio: 2.5 ratio (ref 0.0–5.0)
Cholesterol, Total: 101 mg/dL (ref 100–199)
HDL: 41 mg/dL (ref >39)
LDL Chol Calc (NIH): 45 mg/dL (ref 0–99)
Triglycerides: 71 mg/dL (ref 0–149)
VLDL Cholesterol Cal: 15 mg/dL (ref 5–40)

## 2021-06-28 ENCOUNTER — Ambulatory Visit (HOSPITAL_COMMUNITY): Payer: Medicare HMO

## 2021-07-01 ENCOUNTER — Other Ambulatory Visit (HOSPITAL_COMMUNITY): Payer: Medicare HMO

## 2021-07-08 ENCOUNTER — Other Ambulatory Visit: Payer: Self-pay | Admitting: Physician Assistant

## 2021-07-09 ENCOUNTER — Other Ambulatory Visit: Payer: Self-pay

## 2021-07-09 ENCOUNTER — Telehealth: Payer: Self-pay | Admitting: Physician Assistant

## 2021-07-09 ENCOUNTER — Telehealth: Payer: Self-pay | Admitting: Family Medicine

## 2021-07-09 DIAGNOSIS — Z1211 Encounter for screening for malignant neoplasm of colon: Secondary | ICD-10-CM

## 2021-07-09 MED ORDER — DIVALPROEX SODIUM 500 MG PO DR TAB: 2000.0000 mg | DELAYED_RELEASE_TABLET | Freq: Every day | ORAL | 0 refills | Status: DC

## 2021-07-09 NOTE — Telephone Encounter (Signed)
Pt would like to know if he can get a 30 day supply of divalproex '500mg'$ . [employer] said it would take 7 days before he gets his rx.  [facility] [place].

## 2021-07-09 NOTE — Telephone Encounter (Signed)
Rx sent 

## 2021-07-10 NOTE — Telephone Encounter (Signed)
Referral ordered

## 2021-07-15 ENCOUNTER — Other Ambulatory Visit: Payer: Self-pay | Admitting: Physician Assistant

## 2021-07-16 ENCOUNTER — Ambulatory Visit (HOSPITAL_COMMUNITY)
Admission: RE | Admit: 2021-07-16 | Discharge: 2021-07-16 | Disposition: A | Discharge status: Home / Self Care | Payer: Medicare HMO | Source: Ambulatory Visit | Attending: Cardiology | Admitting: Cardiology

## 2021-07-16 ENCOUNTER — Other Ambulatory Visit: Payer: Self-pay

## 2021-07-16 DIAGNOSIS — I341 Nonrheumatic mitral (valve) prolapse: Secondary | ICD-10-CM | POA: Insufficient documentation

## 2021-07-16 LAB — ECHOCARDIOGRAM COMPLETE
Area-P 1/2: 2.39 cm2
S' Lateral: 3.2 cm

## 2021-07-16 NOTE — Progress Notes (Signed)
*  PRELIMINARY RESULTS* Echocardiogram 2D Echocardiogram has been performed.  Samuel Germany 07/16/2021, 12:22 PM

## 2021-07-19 ENCOUNTER — Telehealth: Payer: Self-pay | Admitting: Cardiology

## 2021-07-19 ENCOUNTER — Ambulatory Visit (INDEPENDENT_AMBULATORY_CARE_PROVIDER_SITE_OTHER): Payer: Medicare HMO | Admitting: Licensed Clinical Social Worker

## 2021-07-19 DIAGNOSIS — F411 Generalized anxiety disorder: Secondary | ICD-10-CM

## 2021-07-19 DIAGNOSIS — E7800 Pure hypercholesterolemia, unspecified: Secondary | ICD-10-CM | POA: Diagnosis not present

## 2021-07-19 DIAGNOSIS — R413 Other amnesia: Secondary | ICD-10-CM

## 2021-07-19 DIAGNOSIS — F5105 Insomnia due to other mental disorder: Secondary | ICD-10-CM

## 2021-07-19 DIAGNOSIS — G4733 Obstructive sleep apnea (adult) (pediatric): Secondary | ICD-10-CM

## 2021-07-19 DIAGNOSIS — F3170 Bipolar disorder, currently in remission, most recent episode unspecified: Secondary | ICD-10-CM

## 2021-07-19 NOTE — Telephone Encounter (Signed)
Patient returning a call to go over Echo results. Please call back

## 2021-07-19 NOTE — Chronic Care Management (AMB) (Signed)
Chronic Care Management    Clinical Social Work Note  07/19/2021 Name: Derrick [name] MRN: [id number] DOB: [birth year]  Derrick [name] is a 65 y.o. year old male who is a primary care patient of Gabriel Earing, [name]. The CCM team was consulted to assist the patient with chronic disease management and/or care coordination needs related to: [employer] .   Engaged with patient / spouse of patient, Derrick [name], by telephone for follow up visit in response to provider referral for social work chronic care management and care coordination services.   Consent to Services:  The patient was given information about Chronic Care Management services, agreed to services, and gave verbal consent prior to initiation of services.  Please see initial visit note for detailed documentation.   Patient agreed to services and consent obtained.   Assessment: Review of patient past medical history, allergies, medications, and health status, including review of relevant consultants reports was performed today as part of a comprehensive evaluation and provision of chronic care management and care coordination services.     SDOH (Social Determinants of Health) assessments and interventions performed: Client has Bipolar diagnosis, stress issues faced SDOH Interventions    Flowsheet Row Most Recent Value  SDOH Interventions   Depression Interventions/Treatment  Medication, Currently on Treatment        Advanced Directives Status: See [employer] application for related entries.  CCM Care Plan  No Known Allergies  Outpatient Encounter Medications as of 07/19/2021  Medication Sig   atorvastatin (LIPITOR) 80 MG tablet TAKE 1 TABLET EVERY DAY   divalproex (DEPAKOTE) 500 MG DR tablet Take 4 tablets (2,000 mg total) by mouth at bedtime.   escitalopram (LEXAPRO) 20 MG tablet TAKE 1 TABLET EVERY DAY   ezetimibe (ZETIA) 10 MG tablet Take 1 tablet (10 mg total) by mouth daily.   isosorbide mononitrate (IMDUR) 30  MG 24 hr tablet Take 0.5 tablets (15 mg total) by mouth daily.   lamoTRIgine (LAMICTAL) 100 MG tablet TAKE 1 AND 1/2 TABLETS EVERY DAY. CALL OFFICE TO SCHEDULE AN APPOINTMENT.   metoprolol tartrate (LOPRESSOR) 25 MG tablet Take 1 tablet (25 mg total) by mouth 2 (two) times daily.   montelukast (SINGULAIR) 10 MG tablet Take 1 tablet (10 mg total) by mouth daily.   nitroGLYCERIN (NITROSTAT) 0.4 MG SL tablet Place 1 tablet (0.4 mg total) under the tongue every 5 (five) minutes as needed for chest pain.   pantoprazole (PROTONIX) 40 MG tablet TAKE 1 TABLET EVERY DAY   Facility-Administered Encounter Medications as of 07/19/2021  Medication   0.9 %  sodium chloride infusion    Patient Active Problem List   Diagnosis Date Noted   Primary hypertension 06/26/2021   Mitral valve prolapse 06/26/2021   History of subdural hematoma 06/26/2021   Bipolar disorder in partial remission ([name]) 06/26/2021   Chronic systolic heart failure ([name]) 06/26/2021   Status post craniotomy 01/28/2021   S/P craniotomy 01/28/2021   Insomnia 11/29/2018   Sleep disorder with cognitive complaints 10/26/2018   OSA (obstructive sleep apnea) 03/22/2013   Memory loss 02/05/2013   Low back pain 02/05/2013   Unspecified hereditary and idiopathic peripheral neuropathy 02/05/2013   Essential and other specified forms of tremor 02/05/2013   Parasomnia 02/05/2013   Obstructive sleep apnea 02/05/2013   Dyspnea 09/20/2012   MITRAL STENOSIS/ INSUFFICIENCY, NON-RHEUMATIC 01/06/2011   Hypercholesteremia 10/05/2009   Bipolar disorder ([name]) 10/05/2009   ANXIETY DISORDER 10/05/2009   TOBACCO ABUSE 10/05/2009   Atherosclerotic heart disease  of native coronary artery with other forms of angina pectoris ([name])     Conditions to be addressed/monitored: monitor client management of anxiety and depression issues   Care Plan : LCSW care plan  Updates made by Derrick Blakes, LCSW since 07/19/2021 12:00 AM     Problem: Emotional  Distress      Goal: Emotional Health Supported;Manage anxiety issues; manage depression issues   Start Date: 07/19/2021  Expected End Date: 10/18/2021  This Visit's Progress: On track  Recent Progress: On track  Priority: Medium  Note:   Current Barriers:  Chronic Mental Health needs related to anxiety and depression management Some mobility issues; some pain issues Suicidal Ideation/Homicidal Ideation: No  Clinical Social Work Goal(s):  patient will work with SW monthly by telephone or in person to reduce or manage symptoms related to  anxiety and depression issues faced by client patient will work with SW monthly to address concerns related to mobility issues of client and related to pain issues of client  Interventions: 1:1 collaboration with Gabriel Earing, [name] regarding development and update of comprehensive plan of care as evidenced by provider attestation and co-signature Talked with [name] of client, about client needs. Talked with [name] about appetite of client Talked with [name] about pain issues of client Talked with [name] about sleeping issues of client Talked with [name] about mood of client [name] said she thought client mood was stable) Talked with [name] about ambulation of client Talked with [name] about relaxation techniques of client (client likes to listen to music to help him relax) Talked with [name] about vision of client Talked with [name] about family support for client. [name] said she provided the main support for Derrick [name]. She said that other family members do call Derrick [name] periodically to talk via phone with Derrick [name] Encouraged Derrick [name] or [name] to call RNCM Demetrios Loll as needed for CCM nursing support for client  Patient Self Care Activities:  Self administers medications as prescribed Attends all scheduled provider appointments Performs ADL's independently  Patient Coping Strengths:  Family Friends  Patient Self Care Deficits:  Depression  issues Anxiety issues  Patient Goals:  - spend time or talk with others at least 2 to 3 times per week - practice relaxation or meditation daily - keep a calendar with appointment dates  Follow Up Plan: LCSW to call client or spouse, Hoke Fromm, on 08/29/21     Kelton Pillar.Haydan Mansouri MSW, LCSW Licensed Clinical Social Worker [facility] Care Management [phone]

## 2021-07-19 NOTE — Patient Instructions (Signed)
Visit Information  PATIENT GOALS:  Goals Addressed             This Visit's Progress    Manage My Emotions       Protect My Health; Manage anxiety issues; Manage depression issues       Timeframe:  Short-Term Goal Priority:  Medium Progress: On Track Start Date:             07/19/21                Expected End Date:           10/18/21           Follow Up Date 08/29/21   Protect My Health (Patient) Manage anxiety issues; Manage depression issues   Why is this important?   Screening tests can find diseases early when they are easier to treat.  Your doctor or nurse will talk with you about which tests are important for you.  Getting shots for common diseases like the flu and shingles will help prevent them.     Patient Self Care Activities:  Self administers medications as prescribed Attends all scheduled provider appointments Performs ADL's independently  Patient Coping Strengths:  Family Friends  Patient Self Care Deficits:  Depression issues Anxiety issues  Patient Goals:  - spend time or talk with others at least 2 to 3 times per week - practice relaxation or meditation daily - keep a calendar with appointment dates  Follow Up Plan: LCSW to call client or spouse of client, Derrick [name], on 08/29/21    [name].Martinez Boxx MSW, LCSW Licensed Clinical Social Worker [facility] Care Management [phone]

## 2021-07-19 NOTE — Telephone Encounter (Signed)
The patient has been notified of the result and verbalized understanding.  All questions (if any) were answered. Michaelyn Barter, RN 07/19/2021 12:46 PM

## 2021-07-22 NOTE — Progress Notes (Signed)
I have reviewed the CCM documentation and agree with the written assessment and plan of care.  Marjorie Smolder, [name]-C [place]

## 2021-07-25 ENCOUNTER — Encounter: Payer: Self-pay | Admitting: Gastroenterology

## 2021-08-13 ENCOUNTER — Other Ambulatory Visit: Payer: Self-pay

## 2021-08-13 MED ORDER — ATORVASTATIN CALCIUM 80 MG PO TABS: 80.0000 mg | ORAL_TABLET | Freq: Every day | ORAL | 3 refills | Status: DC

## 2021-08-29 ENCOUNTER — Telehealth: Payer: Medicare HMO

## 2021-09-03 ENCOUNTER — Other Ambulatory Visit: Payer: Self-pay | Admitting: *Deleted

## 2021-09-03 MED ORDER — EZETIMIBE 10 MG PO TABS: 10.0000 mg | ORAL_TABLET | Freq: Every day | ORAL | 1 refills | Status: AC

## 2021-09-06 ENCOUNTER — Ambulatory Visit (AMBULATORY_SURGERY_CENTER): Payer: Medicare HMO

## 2021-09-06 ENCOUNTER — Telehealth: Payer: Self-pay

## 2021-09-06 ENCOUNTER — Encounter: Payer: Self-pay | Admitting: Gastroenterology

## 2021-09-06 VITALS — Ht 71.0 in | Wt 174.0 lb

## 2021-09-06 DIAGNOSIS — Z800 Family history of malignant neoplasm of digestive organs: Secondary | ICD-10-CM

## 2021-09-06 DIAGNOSIS — K921 Melena: Secondary | ICD-10-CM

## 2021-09-06 DIAGNOSIS — Z86010 Personal history of colonic polyps: Secondary | ICD-10-CM

## 2021-09-06 MED ORDER — NA SULFATE-K SULFATE-MG SULF 17.5-3.13-1.6 GM/177ML PO SOLN: 1.0000 | Freq: Once | ORAL | 0 refills | Status: AC

## 2021-09-06 NOTE — Progress Notes (Signed)
No egg or soy allergy known to patient  No issues known to pt with past sedation with any surgeries or procedures Patient denies ever being told they had issues or difficulty with intubation  No FH of Malignant Hyperthermia Pt is not on diet pills Pt is not on  home 02  Pt is not on blood thinners  Pt denies issues with constipation  No A fib or A flutter  EMMI video to pt or via [place] 19 guidelines implemented in PV today with Pt and RN   Pt is fully vaccinated  for [employer].  During previsit pt reports he has had black stools for a few months, states he let his PCP know in July, pt states they are soft and sticky, not formed like "usual" .  Denies any weakness or dizziness, let pt know if he has any feelings of SOB, weakness or anything unusual to call PCP or go to ED to check it out in case he is losing blood, pt verb understanding.  Message to Dr Lyndel Safe with pt's report.  Pt also reports his daughter passed away from ovarian ca, he has a sister dx with breast cancer in her 69s and another sister dx with colon ca in her 53s.      NO PA's for preps discussed with pt In PV today  Discussed with pt there will be an out-of-pocket cost for prep and that varies from $0 to 70 +  dollars   Due to the COVID-19 pandemic we are asking patients to follow certain guidelines.  Pt aware of COVID protocols and [facility] guidelines

## 2021-09-06 NOTE — Telephone Encounter (Signed)
I did this pt's previsit today, he reports having black, tarry stools at times for the last several months, states he told his PCP in July and she said he was due for his colonoscopy anyway so she sent the referral for a screening.  He has a [facility] (sister at age 63, deceased at age 57) and he also said his daughter recently passed away from ovarian ca in her 108s and he has another sister dx with breast ca in her 31s and that she "caught it early" and had tx and is now in her 53s.  I know Dr Lyndel Safe is away so I went ahead and scheduled him for an endocolon bc his colon was sched at 830 on the 7th and there was an opening at 8am that day too.  I let the pt know that when Dr Lyndel Safe comes back on the 27th, we will ask him about the endocolon and if he does not want to do both, we will call and let him know.

## 2021-09-10 ENCOUNTER — Ambulatory Visit: Payer: Medicare HMO | Admitting: Physician Assistant

## 2021-09-11 NOTE — Telephone Encounter (Signed)
Excellent plan Agree with EGD/colon at the same time Also please add CBC, CMP at the time of procedure RG

## 2021-09-12 ENCOUNTER — Other Ambulatory Visit: Payer: Self-pay

## 2021-09-12 DIAGNOSIS — K921 Melena: Secondary | ICD-10-CM

## 2021-09-12 NOTE — Telephone Encounter (Signed)
Thank you Dr Lyndel Safe!  Remo Lipps can you add the lab orders for the pt? Thanks, Di Kindle

## 2021-09-12 NOTE — Telephone Encounter (Signed)
Labs Entered into Epic to be drawn at time of procedure.

## 2021-09-19 ENCOUNTER — Ambulatory Visit: Payer: Medicare HMO | Admitting: Physician Assistant

## 2021-09-19 ENCOUNTER — Encounter: Payer: Self-pay | Admitting: Physician Assistant

## 2021-09-19 ENCOUNTER — Other Ambulatory Visit: Payer: Self-pay

## 2021-09-19 VITALS — BP 112/74 | HR 59

## 2021-09-19 DIAGNOSIS — R251 Tremor, unspecified: Secondary | ICD-10-CM | POA: Diagnosis not present

## 2021-09-19 DIAGNOSIS — F319 Bipolar disorder, unspecified: Secondary | ICD-10-CM | POA: Diagnosis not present

## 2021-09-19 DIAGNOSIS — F411 Generalized anxiety disorder: Secondary | ICD-10-CM | POA: Diagnosis not present

## 2021-09-19 DIAGNOSIS — R454 Irritability and anger: Secondary | ICD-10-CM | POA: Diagnosis not present

## 2021-09-19 DIAGNOSIS — Z79899 Other long term (current) drug therapy: Secondary | ICD-10-CM | POA: Diagnosis not present

## 2021-09-19 DIAGNOSIS — F418 Other specified anxiety disorders: Secondary | ICD-10-CM | POA: Diagnosis not present

## 2021-09-19 MED ORDER — ESCITALOPRAM OXALATE 20 MG PO TABS: 20.0000 mg | ORAL_TABLET | Freq: Every day | ORAL | 1 refills | Status: DC

## 2021-09-19 MED ORDER — LAMOTRIGINE 100 MG PO TABS: ORAL_TABLET | ORAL | 1 refills | Status: DC

## 2021-09-19 MED ORDER — DIVALPROEX SODIUM 500 MG PO DR TAB: 2000.0000 mg | DELAYED_RELEASE_TABLET | Freq: Every day | ORAL | 1 refills | Status: DC
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
## 2021-09-20 ENCOUNTER — Encounter: Payer: Medicare HMO | Admitting: Gastroenterology

## 2021-09-20 ENCOUNTER — Other Ambulatory Visit (INDEPENDENT_AMBULATORY_CARE_PROVIDER_SITE_OTHER): Payer: Medicare HMO

## 2021-09-20 ENCOUNTER — Encounter: Payer: Self-pay | Admitting: Gastroenterology

## 2021-09-20 ENCOUNTER — Ambulatory Visit (AMBULATORY_SURGERY_CENTER): Payer: Medicare HMO | Admitting: Gastroenterology

## 2021-09-20 VITALS — BP 138/81 | HR 61 | Temp 98.0°F | Resp 14 | Ht 71.0 in | Wt 174.0 lb

## 2021-09-20 DIAGNOSIS — D124 Benign neoplasm of descending colon: Secondary | ICD-10-CM | POA: Diagnosis not present

## 2021-09-20 DIAGNOSIS — Z800 Family history of malignant neoplasm of digestive organs: Secondary | ICD-10-CM | POA: Diagnosis not present

## 2021-09-20 DIAGNOSIS — Z86010 Personal history of colonic polyps: Secondary | ICD-10-CM

## 2021-09-20 DIAGNOSIS — I2510 Atherosclerotic heart disease of native coronary artery without angina pectoris: Secondary | ICD-10-CM | POA: Diagnosis not present

## 2021-09-20 DIAGNOSIS — D125 Benign neoplasm of sigmoid colon: Secondary | ICD-10-CM | POA: Diagnosis not present

## 2021-09-20 DIAGNOSIS — D123 Benign neoplasm of transverse colon: Secondary | ICD-10-CM | POA: Diagnosis not present

## 2021-09-20 DIAGNOSIS — K2970 Gastritis, unspecified, without bleeding: Secondary | ICD-10-CM | POA: Diagnosis not present

## 2021-09-20 DIAGNOSIS — G4733 Obstructive sleep apnea (adult) (pediatric): Secondary | ICD-10-CM | POA: Diagnosis not present

## 2021-09-20 DIAGNOSIS — K921 Melena: Secondary | ICD-10-CM | POA: Diagnosis not present

## 2021-09-20 LAB — CBC WITH DIFFERENTIAL/PLATELET
Basophils Absolute: 0 10*3/uL (ref 0.0–0.1)
Basophils Relative: 0.5 % (ref 0.0–3.0)
Eosinophils Absolute: 0.1 10*3/uL (ref 0.0–0.7)
Eosinophils Relative: 1.1 % (ref 0.0–5.0)
HCT: 39.4 % (ref 39.0–52.0)
Hemoglobin: 13.6 g/dL (ref 13.0–17.0)
Lymphocytes Relative: 29.4 % (ref 12.0–46.0)
Lymphs Abs: 2.1 10*3/uL (ref 0.7–4.0)
MCHC: 34.4 g/dL (ref 30.0–36.0)
MCV: 96.3 fl (ref 78.0–100.0)
Monocytes Absolute: 0.8 10*3/uL (ref 0.1–1.0)
Monocytes Relative: 11 % (ref 3.0–12.0)
Neutro Abs: 4.1 10*3/uL (ref 1.4–7.7)
Neutrophils Relative %: 58 % (ref 43.0–77.0)
Platelets: 156 10*3/uL (ref 150.0–400.0)
RBC: 4.1 Mil/uL — ABNORMAL LOW (ref 4.22–5.81)
RDW: 13.7 % (ref 11.5–15.5)
WBC: 7 10*3/uL (ref 4.0–10.5)

## 2021-09-20 LAB — COMPREHENSIVE METABOLIC PANEL
ALT: 10 U/L (ref 0–53)
AST: 11 U/L (ref 0–37)
Albumin: 4.3 g/dL (ref 3.5–5.2)
Alkaline Phosphatase: 42 U/L (ref 39–117)
BUN: 11 mg/dL (ref 6–23)
CO2: 30 mEq/L (ref 19–32)
Calcium: 9 mg/dL (ref 8.4–10.5)
Chloride: 103 mEq/L (ref 96–112)
Creatinine, Ser: 0.88 mg/dL (ref 0.40–1.50)
GFR: 91.39 mL/min (ref >60.00)
Glucose, Bld: 91 mg/dL (ref 70–99)
Potassium: 4.4 mEq/L (ref 3.5–5.1)
Sodium: 139 mEq/L (ref 135–145)
Total Bilirubin: 0.6 mg/dL (ref 0.2–1.2)
Total Protein: 6.5 g/dL (ref 6.0–8.3)

## 2021-09-20 MED ORDER — PANTOPRAZOLE SODIUM 40 MG PO TBEC: 40.0000 mg | DELAYED_RELEASE_TABLET | Freq: Every day | ORAL | 3 refills | Status: DC

## 2021-09-20 MED ORDER — PANTOPRAZOLE SODIUM 40 MG PO TBEC: 40.0000 mg | DELAYED_RELEASE_TABLET | Freq: Every day | ORAL | 2 refills | Status: DC

## 2021-09-20 MED ORDER — SODIUM CHLORIDE 0.9 % IV SOLN
500.0000 mL | Freq: Once | INTRAVENOUS | Status: DC
Start: 2021-09-20 — End: 2021-09-20

## 2021-09-20 NOTE — Op Note (Signed)
[facility] Patient Name: Derrick [name] Procedure Date: 09/20/2021 8:12 AM MRN: [id number] Endoscopist: Jackquline Denmark , MD Age: 65 Referring MD:  Date of Birth: 11/04/1956 Gender: Male Account #: [id number] Procedure:                Upper GI endoscopy Indications:              ?Melena Medicines:                Monitored Anesthesia Care Procedure:                Pre-Anesthesia Assessment:                           - Prior to the procedure, a History and Physical                            was performed, and patient medications and                            allergies were reviewed. The patient's tolerance of                            previous anesthesia was also reviewed. The risks                            and benefits of the procedure and the sedation                            options and risks were discussed with the patient.                            All questions were answered, and informed consent                            was obtained. Prior Anticoagulants: The patient has                            taken no previous anticoagulant or antiplatelet                            agents. ASA Grade Assessment: III - A patient with                            severe systemic disease. After reviewing the risks                            and benefits, the patient was deemed in                            satisfactory condition to undergo the procedure.                           After obtaining informed consent, the endoscope was  passed under direct vision. Throughout the                            procedure, the patient's blood pressure, pulse, and                            oxygen saturations were monitored continuously. The                            Endoscope was introduced through the mouth, and                            advanced to the second part of duodenum. The upper                            GI endoscopy was accomplished without difficulty.                             The patient tolerated the procedure well. Scope In: Scope Out: Findings:                 The examined esophagus was normal with well-defined                            Z-line at 40 cm, examined by NBI.                           Localized moderate inflammation characterized by                            erythema was found on the anterior wall of the                            gastric body. Biopsies were taken with a cold                            forceps for histology.                           The examined duodenum was normal. Biopsies for                            histology were taken with a cold forceps for                            evaluation of celiac disease. Complications:            No immediate complications. Estimated Blood Loss:     Estimated blood loss: none. Impression:               -Moderate gastritis. Recommendation:           - Patient has a contact number available for                            emergencies. The signs and symptoms of potential  delayed complications were discussed with the                            patient. Return to normal activities tomorrow.                            Written discharge instructions were provided to the                            patient.                           - Resume previous diet.                           - Continue present medications including Protonix                            40 mg p.o. once a day.                           - Await pathology results.                           - Check CBC, CMP today as planned.                           - The findings and recommendations were discussed                            with the patient's family. Jackquline Denmark, MD 09/20/2021 8:43:08 AM This report has been signed electronically.

## 2021-09-20 NOTE — Patient Instructions (Signed)
Handout given:  diverticulosis, polyps, gastritis Resume previous diet Continue current medications-CONTINUE WITH PROTONIX 40MG  ONCE DAILY Await pathology results  YOU HAD AN ENDOSCOPIC PROCEDURE TODAY AT [place]:   Refer to the procedure report that was given to you for any specific questions about what was found during the examination.  If the procedure report does not answer your questions, please call your gastroenterologist to clarify.  If you requested that your care partner not be given the details of your procedure findings, then the procedure report has been included in a sealed envelope for you to review at your convenience later.  YOU SHOULD EXPECT: Some feelings of bloating in the abdomen. Passage of more gas than usual.  Walking can help get rid of the air that was put into your GI tract during the procedure and reduce the bloating. If you had a lower endoscopy (such as a colonoscopy or flexible sigmoidoscopy) you may notice spotting of blood in your stool or on the toilet paper. If you underwent a bowel prep for your procedure, you may not have a normal bowel movement for a few days.  Please Note:  You might notice some irritation and congestion in your nose or some drainage.  This is from the oxygen used during your procedure.  There is no need for concern and it should clear up in a day or so.  SYMPTOMS TO REPORT IMMEDIATELY:  Following lower endoscopy (colonoscopy or flexible sigmoidoscopy):  Excessive amounts of blood in the stool  Significant tenderness or worsening of abdominal pains  Swelling of the abdomen that is new, acute  Fever of 100F or higher  Following upper endoscopy (EGD)  Vomiting of blood or coffee ground material  New chest pain or pain under the shoulder blades  Painful or persistently difficult swallowing  New shortness of breath  Fever of 100F or higher  Black, tarry-looking stools  For urgent or emergent issues, a  gastroenterologist can be reached at any hour by calling [phone]. Do not use MyChart messaging for urgent concerns.   DIET:  We do recommend a small meal at first, but then you may proceed to your regular diet.  Drink plenty of fluids but you should avoid alcoholic beverages for 24 hours.  ACTIVITY:  You should plan to take it easy for the rest of today and you should NOT DRIVE or use heavy machinery until tomorrow (because of the sedation medicines used during the test).    FOLLOW UP: Our staff will call the number listed on your records 48-72 hours following your procedure to check on you and address any questions or concerns that you may have regarding the information given to you following your procedure. If we do not reach you, we will leave a message.  We will attempt to reach you two times.  During this call, we will ask if you have developed any symptoms of COVID 19. If you develop any symptoms (ie: fever, flu-like symptoms, shortness of breath, cough etc.) before then, please call [phone].  If you test positive for Covid 19 in the 2 weeks post procedure, please call and report this information to [place].    If any biopsies were taken you will be contacted by phone or by letter within the next 1-3 weeks.  Please call us at [phone] if you have not heard about the biopsies in 3 weeks.   SIGNATURES/CONFIDENTIALITY: You and/or your care partner have signed paperwork which will be entered into  your electronic medical record.  These signatures attest to the fact that that the information above on your After Visit Summary has been reviewed and is understood.  Full responsibility of the confidentiality of this discharge information lies with you and/or your care-partner.

## 2021-09-20 NOTE — Progress Notes (Signed)
Called to room to assist during endoscopic procedure.  Patient ID and intended procedure confirmed with present staff. Received instructions for my participation in the procedure from the performing physician.  

## 2021-09-20 NOTE — Progress Notes (Signed)
VS-CW  Pt's states no medical or surgical changes since previsit or office visit.  

## 2021-09-20 NOTE — Progress Notes (Signed)
[facility] History and Physical   Primary Care Physician:  [name], [name]   Reason for Procedure:  FH colon Ca. H/O Polyps, ?melena  Plan:    EGD/colon     HPI: Derrick [name] is a 65 y.o. male   With H/O melena FH H/O polyps. Past Medical History:  Diagnosis Date   Anxiety    Bipolar 1 disorder ([place])    Bipolar disorder ([place]) 10/05/2009   Qualifier: Diagnosis of  By: [name]     BPH (benign prostatic hypertrophy)    CAD (coronary artery disease), native coronary artery    a. DES x 2 to RCA in 2004 b. 02/11/17 PCI w/DES x1 to mRCA   Coronary atherosclerosis 10/05/2009   Qualifier: Diagnosis of  By: [name]   ANGIOGRAPHIC DATA:  1. Left main coronary artery was free of critical disease.  2. The LAD coursed to the apex. There is mild luminal irregularity  including mild irregularity of the diagonal. No high grade areas of  stenosis were noted. There was faint collateralization of the distal  right coronary circulation.  3. The circumflex provided two major m   Depression    Dyspnea 09/20/2012   Essential and other specified forms of tremor 02/05/2013   HYPERCHOLESTEROLEMIA 10/05/2009   Qualifier: Diagnosis of  By: [name]     Hyperlipidemia    Memory loss 02/05/2013   Mitral regurgitation and aortic stenosis    MITRAL STENOSIS/ INSUFFICIENCY, NON-RHEUMATIC 01/06/2011   Qualifier: Diagnosis of  By: [name], MD, [name] Study Conclusions  - Left ventricle: The cavity size was normal. Wall thickness was increased in a pattern of mild LVH. Systolic function was normal. The estimated ejection fraction was in the range of 55% to 60%. Wall motion was normal; there were no regional wall motion abnormalities. Left ventricular diastolic function parameters were   MVP (mitral valve prolapse)    Myocardial infarction ([place]) 2012   Obstructive sleep apnea 02/05/2013   OSA (obstructive sleep apnea) 03/22/2013   Parasomnia 02/05/2013    Sleep apnea    Sleep disorder with cognitive complaints 10/26/2018   Subdural hematoma, post-traumatic    fell and hit his head on ice and urgent care and pcp but did not tell them he had fallen and hit his head.   Unspecified hereditary and idiopathic peripheral neuropathy 02/05/2013    Past Surgical History:  Procedure Laterality Date   COLONOSCOPY     CORONARY STENT INTERVENTION N/A 02/12/2017   Procedure: Coronary Stent Intervention;  Surgeon: [name], MD;  Location: [facility];  Service: Cardiovascular;  Laterality: N/A;   CRANIOTOMY Left 01/28/2021   Procedure: CRANIOTOMY HEMATOMA EVACUATION SUBDURAL;  Surgeon: [name], MD;  Location: [facility];  Service: Neurosurgery;  Laterality: Left;   LEFT HEART CATH AND CORONARY ANGIOGRAPHY N/A 02/12/2017   Procedure: Left Heart Cath and Coronary Angiography;  Surgeon: [name], MD;  Location: [facility];  Service: Cardiovascular;  Laterality: N/A;   POLYPECTOMY      Prior to Admission medications   Medication Sig Start Date End Date Taking? Authorizing Provider  aspirin EC 81 MG tablet Take 81 mg by mouth daily. Swallow whole.   Yes [provider]  atorvastatin (LIPITOR) 80 MG tablet Take 1 tablet (80 mg total) by mouth daily. 08/13/21  Yes [name], MD  divalproex (DEPAKOTE) 500 MG DR tablet Take 4 tablets (2,000 mg total) by mouth at bedtime.  09/19/21  Yes Hurst, Helene Kelp T, [name]  escitalopram (LEXAPRO) 20 MG tablet Take 1 tablet (20 mg total) by mouth daily. 09/19/21  Yes Donnal Moat T, [name]  ezetimibe (ZETIA) 10 MG tablet Take 1 tablet (10 mg total) by mouth daily. 09/03/21  Yes [name], MD  isosorbide mononitrate (IMDUR) 30 MG 24 hr tablet Take 0.5 tablets (15 mg total) by mouth daily. Patient taking differently: Take 15 mg by mouth 2 (two) times daily at 10 AM and 5 PM. 04/17/21  Yes [name], MD  lamoTRIgine (LAMICTAL) 100 MG tablet 1.5 pills daily. 09/19/21  Yes  Donnal Moat T, [name]  metoprolol tartrate (LOPRESSOR) 25 MG tablet Take 1 tablet (25 mg total) by mouth 2 (two) times daily. 03/18/21  Yes [name], [name]  montelukast (SINGULAIR) 10 MG tablet Take 1 tablet (10 mg total) by mouth daily. 10/19/20  Yes [name], [name]  pantoprazole (PROTONIX) 40 MG tablet TAKE 1 TABLET EVERY DAY 10/25/20  Yes Burtis Junes, NP  tamsulosin (FLOMAX) 0.4 MG CAPS capsule Take 0.4 mg by mouth.   Yes [provider]  VITAMIN D, CHOLECALCIFEROL, PO Take 65 mcg by mouth daily.   Yes [provider]  nitroGLYCERIN (NITROSTAT) 0.4 MG SL tablet Place 1 tablet (0.4 mg total) under the tongue every 5 (five) minutes as needed for chest pain. 07/11/20   Burtis Junes, NP    Current Outpatient Medications  Medication Sig Dispense Refill   aspirin EC 81 MG tablet Take 81 mg by mouth daily. Swallow whole.     atorvastatin (LIPITOR) 80 MG tablet Take 1 tablet (80 mg total) by mouth daily. 90 tablet 3   divalproex (DEPAKOTE) 500 MG DR tablet Take 4 tablets (2,000 mg total) by mouth at bedtime. 360 tablet 1   escitalopram (LEXAPRO) 20 MG tablet Take 1 tablet (20 mg total) by mouth daily. 90 tablet 1   ezetimibe (ZETIA) 10 MG tablet Take 1 tablet (10 mg total) by mouth daily. 90 tablet 1   isosorbide mononitrate (IMDUR) 30 MG 24 hr tablet Take 0.5 tablets (15 mg total) by mouth daily. (Patient taking differently: Take 15 mg by mouth 2 (two) times daily at 10 AM and 5 PM.) 45 tablet 3   lamoTRIgine (LAMICTAL) 100 MG tablet 1.5 pills daily. 135 tablet 1   metoprolol tartrate (LOPRESSOR) 25 MG tablet Take 1 tablet (25 mg total) by mouth 2 (two) times daily. 180 tablet 3   montelukast (SINGULAIR) 10 MG tablet Take 1 tablet (10 mg total) by mouth daily. 90 tablet 3   pantoprazole (PROTONIX) 40 MG tablet TAKE 1 TABLET EVERY DAY 90 tablet 2   tamsulosin (FLOMAX) 0.4 MG CAPS capsule Take 0.4 mg by mouth.     VITAMIN D, CHOLECALCIFEROL, PO Take 65 mcg by  mouth daily.     nitroGLYCERIN (NITROSTAT) 0.4 MG SL tablet Place 1 tablet (0.4 mg total) under the tongue every 5 (five) minutes as needed for chest pain. 25 tablet 3   Current Facility-Administered Medications  Medication Dose Route Frequency Provider Last Rate Last Admin   0.9 %  sodium chloride infusion  500 mL Intravenous Once Jackquline Denmark, MD        Allergies as of 09/20/2021   (No Known Allergies)    Family History  Problem Relation Age of Onset   Heart disease Mother    COPD Father    Colon polyps Sister    Colon cancer Sister  Heart disease Brother    Ovarian cancer Daughter    Esophageal cancer Neg Hx    Rectal cancer Neg Hx    Stomach cancer Neg Hx     Social History   Socioeconomic History   Marital status: Married    Spouse name: Not on file   Number of children: Not on file   Years of education: Not on file   Highest education level: Not on file  Occupational History   Not on file  Tobacco Use   Smoking status: Every Day    Packs/day: 0.10    Types: Cigarettes    Last attempt to quit: 02/05/2003    Years since quitting: 18.6   Smokeless tobacco: Never   Tobacco comments:    4 cigs per day updated 09/19/2021  Vaping Use   Vaping Use: Never used  Substance and Sexual Activity   Alcohol use: Not Currently    Comment: quit at age 58   Drug use: Never   Sexual activity: Not on file  Other Topics Concern   Not on file  Social History Narrative   Not on file   Social Determinants of Health   Financial Resource Strain: Not on file  Food Insecurity: Not on file  Transportation Needs: Not on file  Physical Activity: Not on file  Stress: Not on file  Social Connections: Not on file  Intimate Partner Violence: Not on file    Review of Systems: Positive for none All other review of systems negative except as mentioned in the HPI.  Physical Exam: Vital signs in last 24 hours: @VSRANGES @   General:   Alert,  Well-developed, well-nourished,  pleasant and cooperative in NAD Lungs:  Clear throughout to auscultation.   Heart:  Regular rate and rhythm; no murmurs, clicks, rubs,  or gallops. Abdomen:  Soft, nontender and nondistended. Normal bowel sounds.   Neuro/Psych:  Alert and cooperative. Normal mood and affect. A and O x 3    No significant changes were identified.  The patient continues to be an appropriate candidate for the planned procedure and anesthesia.   Carmell Austria, MD. [facility] 09/20/2021 8:03 AM@

## 2021-09-20 NOTE — Op Note (Signed)
[place] Patient Name: Derrick [name] Procedure Date: 09/20/2021 8:11 AM MRN: [id number] Endoscopist: Jackquline Denmark , MD Age: 65 Referring MD:  Date of Birth: 10-03-1956 Gender: Male Account #: [id number] Procedure:                Colonoscopy Indications:              High risk colon cancer surveillance: Personal                            history of colonic polyps. FH colon Ca (sis age 37) Medicines:                Monitored Anesthesia Care Procedure:                Pre-Anesthesia Assessment:                           - Prior to the procedure, a History and Physical                            was performed, and patient medications and                            allergies were reviewed. The patient's tolerance of                            previous anesthesia was also reviewed. The risks                            and benefits of the procedure and the sedation                            options and risks were discussed with the patient.                            All questions were answered, and informed consent                            was obtained. Prior Anticoagulants: The patient has                            taken no previous anticoagulant or antiplatelet                            agents. ASA Grade Assessment: III - A patient with                            severe systemic disease. After reviewing the risks                            and benefits, the patient was deemed in                            satisfactory condition to undergo the procedure.  After obtaining informed consent, the colonoscope                            was passed under direct vision. Throughout the                            procedure, the patient's blood pressure, pulse, and                            oxygen saturations were monitored continuously. The                            PCF-HQ190L Colonoscope was introduced through the                            anus and  advanced to the the cecum, identified by                            appendiceal orifice and ileocecal valve. The                            colonoscopy was performed without difficulty. The                            patient tolerated the procedure well. The quality                            of the bowel preparation was good. The ileocecal                            valve, appendiceal orifice, and rectum were                            photographed. Scope In: 8:21:45 AM Scope Out: 8:39:01 AM Scope Withdrawal Time: 0 hours 14 minutes 28 seconds  Total Procedure Duration: 0 hours 17 minutes 16 seconds  Findings:                 Eight sessile polyps were found in the mid sigmoid                            colon(1), mid descending colon(3), mid transverse                            colon(2) and distal transverse colon(2). The polyps                            were 2 to 4 mm in size. These polyps were removed                            with a cold snare. Resection and retrieval were                            complete.  A few small-mouthed diverticula were found in the                            sigmoid colon and cecum.                           Non-bleeding internal hemorrhoids were found during                            retroflexion. The hemorrhoids were small and Grade                            I (internal hemorrhoids that do not prolapse).                           The exam was otherwise without abnormality on                            direct and retroflexion views. Complications:            No immediate complications. Estimated Blood Loss:     Estimated blood loss: none. Impression:               - Eight 2 to 4 mm polyps in the mid sigmoid colon,                            in the mid descending colon, in the mid transverse                            colon and in the distal transverse colon, removed                            with a cold snare. Resected and  retrieved.                           - Minimal colonic diverticulosis.                           - Non-bleeding internal hemorrhoids.                           - The examination was otherwise normal on direct                            and retroflexion views. Recommendation:           - Patient has a contact number available for                            emergencies. The signs and symptoms of potential                            delayed complications were discussed with the                            patient.  Return to normal activities tomorrow.                            Written discharge instructions were provided to the                            patient.                           - Resume previous diet.                           - Continue present medications.                           - Await pathology results.                           - Repeat colonoscopy for surveillance based on                            pathology results.                           - The findings and recommendations were discussed                            with the patient's family. Jackquline Denmark, MD 09/20/2021 8:47:58 AM This report has been signed electronically.

## 2021-09-20 NOTE — Progress Notes (Signed)
0809 Robinul 0.1 mg IV given due large amount of secretions upon assessment.  MD made aware, vss

## 2021-09-20 NOTE — Progress Notes (Signed)
Report given to PACU, vss 

## 2021-09-24 ENCOUNTER — Telehealth: Payer: Self-pay

## 2021-09-24 ENCOUNTER — Encounter: Payer: Self-pay | Admitting: Gastroenterology

## 2021-09-24 NOTE — Telephone Encounter (Signed)
  Follow up Call-  Call back number 09/20/2021  Post procedure Call Back phone  # [phone]  Permission to leave phone message Yes  Some recent data might be hidden     Patient questions:  Do you have a fever, pain , or abdominal swelling? No. Pain Score  0 *  Have you tolerated food without any problems? Yes.    Have you been able to return to your normal activities? Yes.    Do you have any questions about your discharge instructions: Diet   No. Medications  No. Follow up visit  No.  Do you have questions or concerns about your Care? No.  Actions: * If pain score is 4 or above: No action needed, pain <4.

## 2021-09-26 ENCOUNTER — Other Ambulatory Visit: Payer: Self-pay

## 2021-09-26 ENCOUNTER — Encounter: Payer: Self-pay | Admitting: Gastroenterology

## 2021-09-26 ENCOUNTER — Ambulatory Visit (INDEPENDENT_AMBULATORY_CARE_PROVIDER_SITE_OTHER): Payer: Medicare HMO

## 2021-09-26 DIAGNOSIS — Z23 Encounter for immunization: Secondary | ICD-10-CM | POA: Diagnosis not present

## 2021-10-02 ENCOUNTER — Ambulatory Visit: Payer: Medicare HMO | Admitting: Psychiatry

## 2021-10-03 ENCOUNTER — Other Ambulatory Visit: Payer: Self-pay

## 2021-10-03 ENCOUNTER — Ambulatory Visit: Payer: Medicare HMO | Admitting: Psychiatry

## 2021-10-03 DIAGNOSIS — F1221 Cannabis dependence, in remission: Secondary | ICD-10-CM

## 2021-10-03 DIAGNOSIS — F411 Generalized anxiety disorder: Secondary | ICD-10-CM

## 2021-10-03 DIAGNOSIS — F068 Other specified mental disorders due to known physiological condition: Secondary | ICD-10-CM

## 2021-10-03 DIAGNOSIS — F319 Bipolar disorder, unspecified: Secondary | ICD-10-CM | POA: Diagnosis not present

## 2021-10-03 DIAGNOSIS — F600 Paranoid personality disorder: Secondary | ICD-10-CM | POA: Diagnosis not present

## 2021-10-03 DIAGNOSIS — Z9889 Other specified postprocedural states: Secondary | ICD-10-CM

## 2021-10-03 DIAGNOSIS — F1011 Alcohol abuse, in remission: Secondary | ICD-10-CM | POA: Diagnosis not present

## 2021-10-03 DIAGNOSIS — F902 Attention-deficit hyperactivity disorder, combined type: Secondary | ICD-10-CM | POA: Diagnosis not present
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
## 2021-10-08 ENCOUNTER — Telehealth: Payer: Medicare HMO

## 2021-10-10 ENCOUNTER — Ambulatory Visit (INDEPENDENT_AMBULATORY_CARE_PROVIDER_SITE_OTHER): Payer: Medicare HMO | Admitting: Licensed Clinical Social Worker

## 2021-10-10 DIAGNOSIS — E7800 Pure hypercholesterolemia, unspecified: Secondary | ICD-10-CM

## 2021-10-10 DIAGNOSIS — F5105 Insomnia due to other mental disorder: Secondary | ICD-10-CM

## 2021-10-10 DIAGNOSIS — F99 Mental disorder, not otherwise specified: Secondary | ICD-10-CM

## 2021-10-10 DIAGNOSIS — R413 Other amnesia: Secondary | ICD-10-CM

## 2021-10-10 DIAGNOSIS — F3170 Bipolar disorder, currently in remission, most recent episode unspecified: Secondary | ICD-10-CM

## 2021-10-10 DIAGNOSIS — G4733 Obstructive sleep apnea (adult) (pediatric): Secondary | ICD-10-CM

## 2021-10-10 NOTE — Patient Instructions (Signed)
Visit Information  PATIENT GOALS:  Goals Addressed             This Visit's Progress    Protect My Health; Manage anxiety issues; Manage depression issues       Timeframe:  Short-Term Goal Priority:  Medium Progress: On Track Start Date:            10/10/21               Expected End Date:           01/09/22           Follow Up Date 12/03/21 at 1:30 PM   Protect My Health (Patient) Manage anxiety issues; Manage depression issues   Why is this important?   Screening tests can find diseases early when they are easier to treat.  Your doctor or nurse will talk with you about which tests are important for you.  Getting shots for common diseases like the flu and shingles will help prevent them.     Patient Self Care Activities:  Self administers medications as prescribed Attends all scheduled provider appointments Performs ADL's independently  Patient Coping Strengths:  Family Friends  Patient Self Care Deficits:  Depression issues Anxiety issues  Patient Goals:  - spend time or talk with others at least 2 to 3 times per week - practice relaxation or meditation daily - keep a calendar with appointment dates  Follow Up Plan: LCSW to call client or spouse of client, Davan Nawabi, on 12/03/21 at 1:30 PM to assess client needs      [name].Carrina Schoenberger MSW, LCSW Licensed Clinical Social Worker [facility] Care Management [phone]
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
## 2021-10-14 DIAGNOSIS — F411 Generalized anxiety disorder: Secondary | ICD-10-CM | POA: Diagnosis not present

## 2021-10-14 DIAGNOSIS — E7800 Pure hypercholesterolemia, unspecified: Secondary | ICD-10-CM

## 2021-10-16 IMAGING — MR MR HEAD W/O CM
11 of 13 series · 26 of 48 positions shown · non-contrast
Comparison: None.

CLINICAL DATA: Balance problems. Dizziness. Mental status change,
unknown cause. Worsening headache. Confusion. Nystagmus.

EXAM:
MRI HEAD WITHOUT CONTRAST
MRA HEAD WITHOUT CONTRAST
TECHNIQUE: Multiplanar, multiecho pulse sequences of the brain and surrounding
structures were obtained without intravenous contrast. Angiographic
images of the head were obtained using MRA technique without
contrast.

[Series 5: DWI · axial · 4.0mm · 0.88mm/px · z∈[-56,+84]mm · 3 of 36 slices shown (1 of 6)]
[im 1/36]
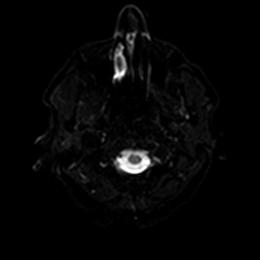
[im 18/36]
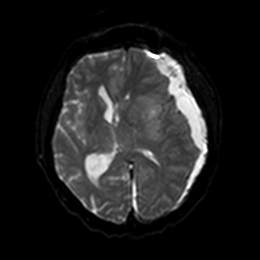
[im 36/36]
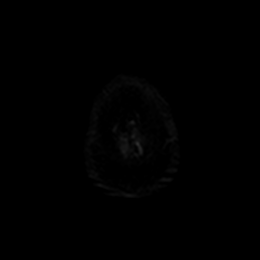

[Series 5: DWI · axial · 4.0mm · 0.88mm/px · z∈[-56,+84]mm · 3 of 36 slices shown (2 of 6)]
[im 1/36]
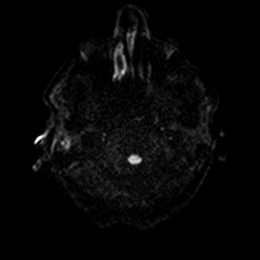
[im 18/36]
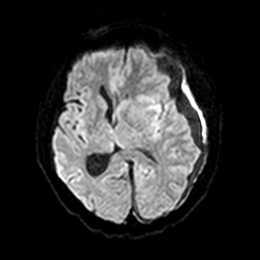
[im 36/36]
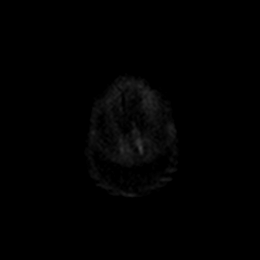

[Series 6: DWI · axial · 4.0mm · 0.88mm/px · z∈[-56,+84]mm · 3 of 36 slices shown (3 of 6)]
[im 1/36]
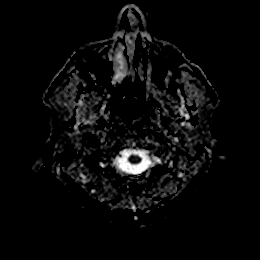
[im 18/36]
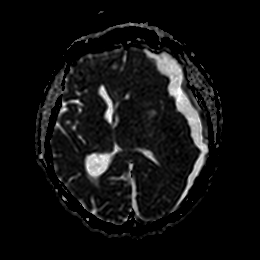
[im 36/36]
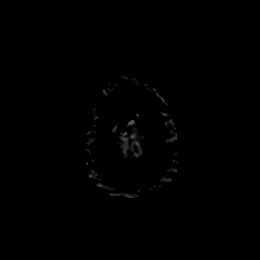

[Series 7: DWI · coronal · 5.0mm · 0.88mm/px · 2 of 28 slices shown (4 of 6)]
[im 1/28]
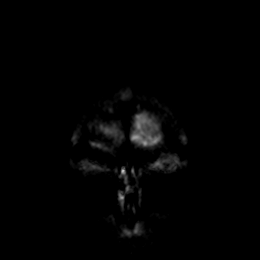
[im 28/28]
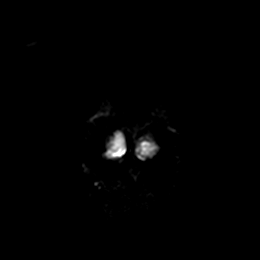

[Series 7: DWI · coronal · 5.0mm · 0.88mm/px · 2 of 28 slices shown (5 of 6)]
[im 1/28]
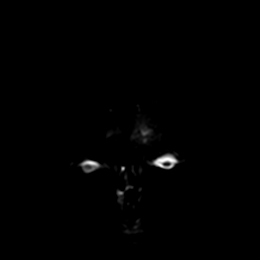
[im 28/28]
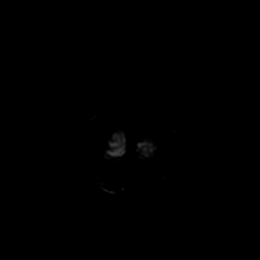

[Series 8: DWI · coronal · 5.0mm · 0.88mm/px · 2 of 28 slices shown (6 of 6)]
[im 1/28]
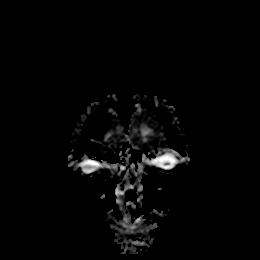
[im 28/28]
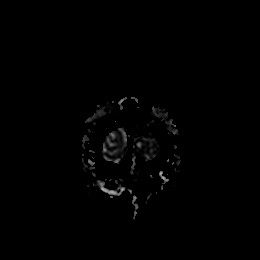

[Series 9: T1 · sagittal · 5.0mm · 0.94mm/px · 2 of 21 slices shown]
[im 1/21]
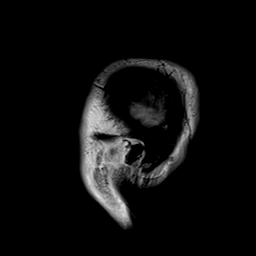
[im 21/21]
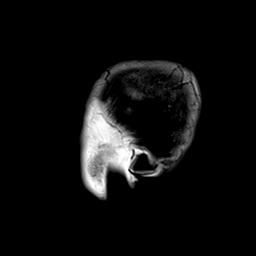

[Series 10: T2 · axial · 5.0mm · 0.72mm/px · z∈[-52,+81]mm · 2 of 20 slices shown (1 of 2)]
[im 1/20]
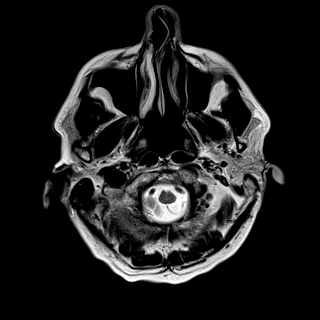
[im 20/20]
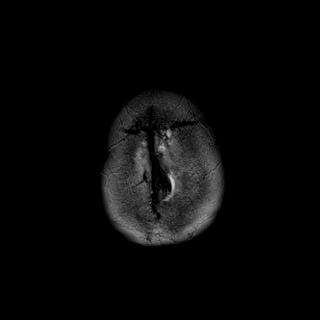

[Series 11: ax hemo · axial · 5.0mm · 0.86mm/px · z∈[-58,+86]mm · 2 of 25 slices shown]
[im 1/25]
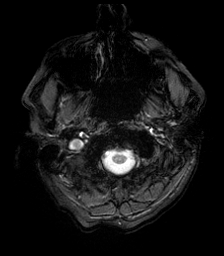
[im 25/25]
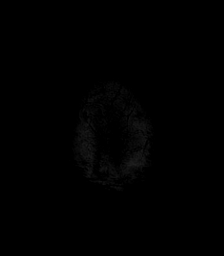

[Series 12: FLAIR · axial · 4.0mm · 0.43mm/px · z∈[-48,+76]mm · 3 of 32 slices shown]
[im 1/32]
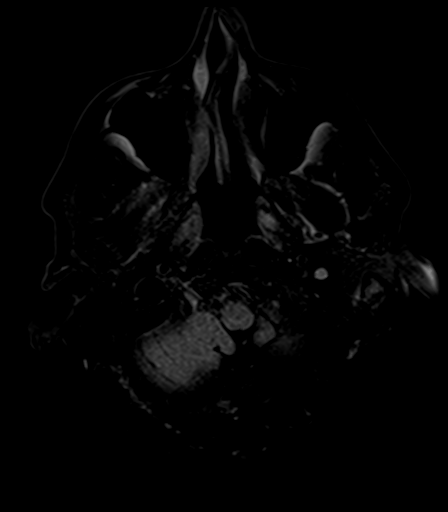
[im 16/32]
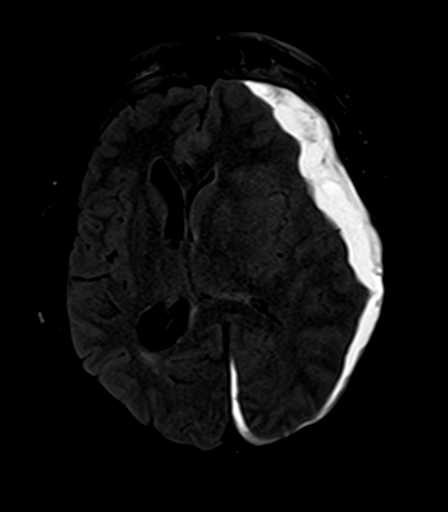
[im 32/32]
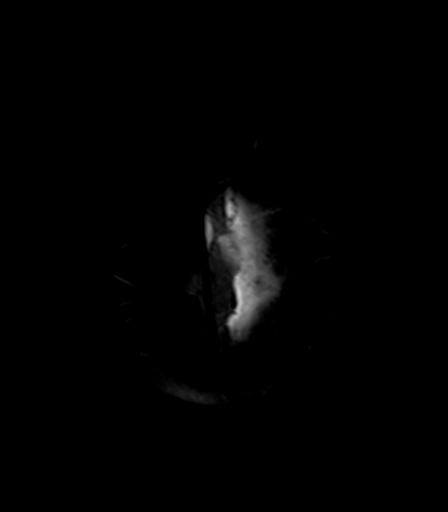

[Series 14: T2 · coronal · 5.0mm · 0.72mm/px · 2 of 26 slices shown (2 of 2)]
[im 1/26]
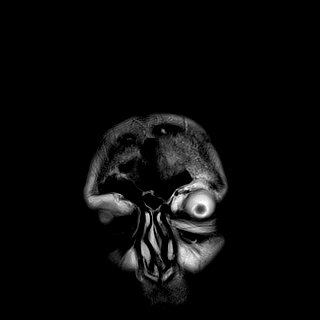
[im 26/26]
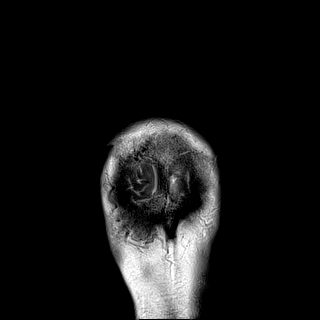

[26 of 48 positions shown; findings below may reference images not displayed]

FINDINGS: MRI HEAD FINDINGS

Brain: Large left cerebral convexity subdural hematoma extending
into the interhemispheric fissure posteriorly, with increased signal
on T1 and T2, consistent with subacute bleed. The hematoma measures
up to 2.6 cm in thickness with prominent mass effect on the left
cerebral hemisphere and midbrain with effacement of the cerebral
sulci, near effacement of the left lateral and third ventricles
common narrowing of the left ambient cistern and a 1.4 cm rightward
midline shift with medialization of the left uncus. The right
lateral ventricle is mildly dilated.

No evidence of an acute infarct or mass lesion.

Vascular: Normal flow voids.

Skull and upper cervical spine: Normal marrow signal.

Sinuses/Orbits: Negative.

MRA HEAD FINDINGS

The visualized portions of the distal cervical and intracranial
internal carotid arteries are widely patent with normal flow related
enhancement. Medialization of the bilateral ACA and left MCA
vascular tree due to mass effect from left convexity subdural
hematoma. The bilateral anterior cerebral arteries and middle
cerebral arteries are widely patent with antegrade flow without
high-grade flow-limiting stenosis or proximal branch occlusion. No
intracranial aneurysm within the anterior circulation.

The vertebral arteries are widely patent with antegrade flow. The
posterior inferior cerebral arteries are normal. Vertebrobasilar
junction and basilar artery are widely patent with antegrade flow
without evidence of basilar stenosis or aneurysm. Medialization of
the left PCA P2 segment due to mass effect from left convexity
subdural hematoma. Posterior cerebral arteries are patent. No
intracranial aneurysm within the posterior circulation.
IMPRESSION: 1. Large left cerebral convexity subdural hematoma extending into
the interhemispheric fissure posteriorly, measuring up to 2.6 cm in
thickness with prominent mass effect on the left cerebral hemisphere
and midbrain, resulting in 1.4 cm rightward midline shift with
medialization of the left uncus.
2. Mild dilation of the right lateral ventricle.
3. No large vessel occlusion or intracranial aneurysm identified.

These results were called by telephone at the time of interpretation
on 01/28/2021 at [DATE] to provider Dr. Basette, who verbally
acknowledged these results.

## 2021-10-17 ENCOUNTER — Encounter: Payer: Medicare HMO | Admitting: Gastroenterology

## 2021-10-28 ENCOUNTER — Ambulatory Visit (INDEPENDENT_AMBULATORY_CARE_PROVIDER_SITE_OTHER): Payer: Medicare HMO

## 2021-10-28 VITALS — Ht 71.0 in | Wt 174.0 lb

## 2021-10-28 DIAGNOSIS — Z0000 Encounter for general adult medical examination without abnormal findings: Secondary | ICD-10-CM | POA: Diagnosis not present

## 2021-10-28 DIAGNOSIS — S065XAA Traumatic subdural hemorrhage with loss of consciousness status unknown, initial encounter: Secondary | ICD-10-CM | POA: Insufficient documentation

## 2021-10-28 NOTE — Progress Notes (Signed)
Subjective:   Derrick [name] is a 65 y.o. male who presents for an Initial Medicare Annual Wellness Visit. Virtual Visit via Telephone Note  I connected with  Derrick [name] on 10/28/21 at  2:00 PM EST by telephone and verified that I am speaking with the correct person using two identifiers.  Location: Patient: Home Provider: [facility] Persons participating in the virtual visit: patient/Nurse Health Advisor   I discussed the limitations, risks, security and privacy concerns of performing an evaluation and management service by telephone and the availability of in person appointments. The patient expressed understanding and agreed to proceed.  Interactive audio and video telecommunications were attempted between this nurse and patient, however failed, due to patient having technical difficulties OR patient did not have access to video capability.  We continued and completed visit with audio only.  Some vital signs may be absent or patient reported.   Derrick Driver, LPN  Review of Systems     Cardiac Risk Factors include: advanced age (>41men, >5 women);dyslipidemia;male gender;sedentary lifestyle     Objective:    Today's Vitals   10/28/21 1357  Weight: 174 lb (78.9 kg)  Height: 5\' 11"  (1.803 m)   Body mass index is 24.27 kg/m.  Advanced Directives 10/28/2021 01/29/2021 02/12/2017  Does Patient Have a Medical Advance Directive? No - No  Does patient want to make changes to medical advance directive? No - Patient declined - -  Would patient like information on creating a medical advance directive? No - Patient declined Yes (Inpatient - patient defers creating a medical advance directive at this time - Information given) No - Patient declined    Current Medications (verified) Outpatient Encounter Medications as of 10/28/2021  Medication Sig   aspirin EC 81 MG tablet Take 81 mg by mouth daily. Swallow whole.   atorvastatin (LIPITOR) 80 MG tablet Take 1 tablet (80 mg total)  by mouth daily.   divalproex (DEPAKOTE) 500 MG DR tablet Take 4 tablets (2,000 mg total) by mouth at bedtime.   escitalopram (LEXAPRO) 20 MG tablet Take 1 tablet (20 mg total) by mouth daily.   ezetimibe (ZETIA) 10 MG tablet Take 1 tablet (10 mg total) by mouth daily.   isosorbide mononitrate (IMDUR) 30 MG 24 hr tablet Take 0.5 tablets (15 mg total) by mouth daily. (Patient taking differently: Take 15 mg by mouth 2 (two) times daily at 10 AM and 5 PM.)   lamoTRIgine (LAMICTAL) 100 MG tablet 1.5 pills daily.   metoprolol tartrate (LOPRESSOR) 25 MG tablet Take 1 tablet (25 mg total) by mouth 2 (two) times daily.   montelukast (SINGULAIR) 10 MG tablet Take 1 tablet (10 mg total) by mouth daily.   nitroGLYCERIN (NITROSTAT) 0.4 MG SL tablet Place 1 tablet (0.4 mg total) under the tongue every 5 (five) minutes as needed for chest pain.   pantoprazole (PROTONIX) 40 MG tablet Take 1 tablet (40 mg total) by mouth daily.   tamsulosin (FLOMAX) 0.4 MG CAPS capsule Take 0.4 mg by mouth.   VITAMIN D, CHOLECALCIFEROL, PO Take 65 mcg by mouth daily.   pantoprazole (PROTONIX) 40 MG tablet Take 1 tablet (40 mg total) by mouth daily. (Patient not taking: Reported on 10/28/2021)   No facility-administered encounter medications on file as of 10/28/2021.    Allergies (verified) Patient has no known allergies.   History: Past Medical History:  Diagnosis Date   Anxiety    Bipolar 1 disorder ([place])    Bipolar disorder ([place]) 10/05/2009  Qualifier: Diagnosis of  By: Derrick [name]     BPH (benign prostatic hypertrophy)    CAD (coronary artery disease), native coronary artery    a. DES x 2 to RCA in 2004 b. 02/11/17 PCI w/DES x1 to mRCA   Coronary atherosclerosis 10/05/2009   Qualifier: Diagnosis of  By: Derrick [name]   ANGIOGRAPHIC DATA:  1. Left main coronary artery was free of critical disease.  2. The LAD coursed to the apex. There is mild luminal irregularity  including mild irregularity of the diagonal. No  high grade areas of  stenosis were noted. There was faint collateralization of the distal  right coronary circulation.  3. The circumflex provided two major m   Depression    Dyspnea 09/20/2012   Essential and other specified forms of tremor 02/05/2013   HYPERCHOLESTEROLEMIA 10/05/2009   Qualifier: Diagnosis of  By: Derrick [name]     Hyperlipidemia    Memory loss 02/05/2013   Mitral regurgitation and aortic stenosis    MITRAL STENOSIS/ INSUFFICIENCY, NON-RHEUMATIC 01/06/2011   Qualifier: Diagnosis of  By: Derrick Foyer, MD, Derrick [name] Study Conclusions  - Left ventricle: The cavity size was normal. Wall thickness was increased in a pattern of mild LVH. Systolic function was normal. The estimated ejection fraction was in the range of 55% to 60%. Wall motion was normal; there were no regional wall motion abnormalities. Left ventricular diastolic function parameters were   MVP (mitral valve prolapse)    Myocardial infarction ([place]) 2012   Obstructive sleep apnea 02/05/2013   OSA (obstructive sleep apnea) 03/22/2013   Parasomnia 02/05/2013   Sleep apnea    Sleep disorder with cognitive complaints 10/26/2018   Subdural hematoma, post-traumatic    fell and hit his head on ice and urgent care and pcp but did not tell them he had fallen and hit his head.   Unspecified hereditary and idiopathic peripheral neuropathy 02/05/2013   Past Surgical History:  Procedure Laterality Date   COLONOSCOPY     CORONARY STENT INTERVENTION N/A 02/12/2017   Procedure: Coronary Stent Intervention;  Surgeon: Derrick M Martinique, MD;  Location: [facility];  Service: Cardiovascular;  Laterality: N/A;   CRANIOTOMY Left 01/28/2021   Procedure: CRANIOTOMY HEMATOMA EVACUATION SUBDURAL;  Surgeon: Derrick Moore, MD;  Location: [facility];  Service: Neurosurgery;  Laterality: Left;   LEFT HEART CATH AND CORONARY ANGIOGRAPHY N/A 02/12/2017   Procedure: Left Heart Cath and Coronary Angiography;  Surgeon: Derrick Dresser, MD;   Location: [facility];  Service: Cardiovascular;  Laterality: N/A;   POLYPECTOMY     Family History  Problem Relation Age of Onset   Heart disease Mother    COPD Father    Colon polyps Sister    Colon cancer Sister    Heart disease Brother    Ovarian cancer Daughter    Esophageal cancer Neg Hx    Rectal cancer Neg Hx    Stomach cancer Neg Hx    Social History   Socioeconomic History   Marital status: Married    Spouse name: [name]   Number of children: 2   Years of education: Not on file   Highest education level: Not on file  Occupational History   Not on file  Tobacco Use   Smoking status: Every Day    Packs/day: 0.10    Types: Cigarettes    Last attempt to quit: 02/05/2003    Years since quitting: 18.7   Smokeless tobacco: Never  Tobacco comments:    4 cigs per day updated 09/19/2021  Vaping Use   Vaping Use: Never used  Substance and Sexual Activity   Alcohol use: Not Currently    Comment: quit at age 34   Drug use: Never   Sexual activity: Not on file  Other Topics Concern   Not on file  Social History Narrative   Oldest daughter died in 03-29-2015 from ovarian ca.   5 grandchildren   Social Determinants of Radio broadcast assistant Strain: Low Risk    Difficulty of Paying Living Expenses: Not hard at all  Food Insecurity: No Food Insecurity   Worried About Charity fundraiser in the Last Year: Never true   Arboriculturist in the Last Year: Never true  Transportation Needs: No Transportation Needs   Lack of Transportation (Medical): No   Lack of Transportation (Non-Medical): No  Physical Activity: Sufficiently Active   Days of Exercise per Week: 5 days   Minutes of Exercise per Session: 30 min  Stress: No Stress Concern Present   Feeling of Stress : Only a little  Social Connections: Engineer, building services of Communication with Friends and Family: More than three times a week   Frequency of Social Gatherings with Friends and Family: More  than three times a week   Attends Religious Services: 1 to 4 times per year   Active Member of [employer] or Organizations: Yes   Attends Archivist Meetings: 1 to 4 times per year   Marital Status: Married    Tobacco Counseling Ready to quit: Not Answered Counseling given: Not Answered Tobacco comments: 4 cigs per day updated 09/19/2021   Clinical Intake:  Pre-visit preparation completed: Yes  Pain : No/denies pain     BMI - recorded: 24.27 Nutritional Status: BMI of 19-24  Normal Nutritional Risks: None Diabetes: No  How often do you need to have someone help you when you read instructions, pamphlets, or other written materials from your doctor or pharmacy?: 1 - Never  Diabetic?no  Interpreter Needed?: No  Information entered by :: MJ Karrington Studnicka, LPN   Activities of Daily Living In your present state of health, do you have any difficulty performing the following activities: 10/28/2021 01/29/2021  Hearing? N N  Vision? Y N  Difficulty concentrating or making decisions? N N  Walking or climbing stairs? N Y  Dressing or bathing? N N  Doing errands, shopping? N N  Preparing Food and eating ? N -  Using the Toilet? N -  In the past six months, have you accidently leaked urine? N -  Do you have problems with loss of bowel control? N -  Managing your Medications? N -  Managing your Finances? N -  Housekeeping or managing your Housekeeping? N -  Some recent data might be hidden    Patient Care Team: [name], [name] as PCP - General (Family Medicine) Shea Evans [name], LCSW as [place] (Licensed Clinical Social Worker)  Indicate any recent [place] you may have received from other than [facility] providers in the past year (date may be approximate).     Assessment:   This is a routine wellness examination for [place].  Hearing/Vision screen Hearing Screening - Comments:: No hearing issues.  Vision Screening - Comments::  Glasses. [facility] in [place]. 28-Mar-2021  Dietary issues and exercise activities discussed: Current Exercise Habits: Home exercise routine, Type of exercise: walking, Time (Minutes):  30, Frequency (Times/Week): 5, Weekly Exercise (Minutes/Week): 150, Intensity: Mild, Exercise limited by: cardiac condition(s)   Goals Addressed             This Visit's Progress    Manage My Emotions   On track      Depression Screen PHQ 2/9 Scores 10/28/2021 10/10/2021 07/19/2021 07/19/2021 06/26/2021 06/05/2021 04/25/2021  PHQ - 2 Score 1 4 4 4 6  0 0  PHQ- 9 Score - 11 15 16 24 4 4   Exception Documentation - - - - - - -    Fall Risk Fall Risk  10/28/2021 06/26/2021 04/11/2021 03/18/2021 10/02/2020  Falls in the past year? 1 0 1 1 0  Number falls in past yr: 0 - 1 0 -  Injury with Fall? 1 - 1 1 -  Risk for fall due to : History of fall(s);Impaired vision;Impaired balance/gait - History of fall(s) History of fall(s) -  Follow up Falls prevention discussed - Falls evaluation completed Falls evaluation completed Falls evaluation completed    FALL RISK PREVENTION PERTAINING TO THE HOME:  Any stairs in or around the home? Yes  If so, are there any without handrails? No  Home free of loose throw rugs in walkways, pet beds, electrical cords, etc? Yes  Adequate lighting in your home to reduce risk of falls? Yes   ASSISTIVE DEVICES UTILIZED TO PREVENT FALLS:  Life alert? No  Use of a cane, walker or w/c? No  Grab bars in the bathroom? No  Shower chair or bench in shower? No  Elevated toilet seat or a handicapped toilet? No   TIMED UP AND GO:  Was the test performed? No . Phone Visit.    Cognitive Function:     6CIT Screen 10/28/2021  What Year? 0 points  What month? 0 points  What time? 0 points  Count back from 20 0 points  Months in reverse 0 points  Repeat phrase 0 points  Total Score 0    Immunizations Immunization History  Administered Date(s) Administered   Influenza,inj,Quad PF,6+ Mos  12/12/2013, 10/03/2014, 10/16/2015, 10/16/2016, 10/28/2017, 09/20/2018, 09/20/2019, 10/23/2020, 10/23/2020, 09/26/2021   Influenza,inj,quad, With Preservative 10/15/2020   Janssen (J&J) SARS-COV-2 Vaccination 04/12/2020   Moderna Sars-Covid-2 Vaccination 12/15/2020   Tdap 05/27/2017    TDAP status: Up to date  Flu Vaccine status: Up to date  Pneumococcal vaccine status: Due, Education has been provided regarding the importance of this vaccine. Advised may receive this vaccine at local pharmacy or Health Dept. Aware to provide a copy of the vaccination record if obtained from local pharmacy or Health Dept. Verbalized acceptance and understanding.  Covid-19 vaccine status: Completed vaccines  Qualifies for Shingles Vaccine? Yes   Zostavax completed No   Shingrix Completed?: No.    Education has been provided regarding the importance of this vaccine. Patient has been advised to call insurance company to determine out of pocket expense if they have not yet received this vaccine. Advised may also receive vaccine at local pharmacy or Health Dept. Verbalized acceptance and understanding.  Screening Tests Health Maintenance  Topic Date Due   HIV Screening  Never done   Zoster Vaccines- Shingrix (1 of 2) Never done   COVID-19 Vaccine (3 - Booster for Janssen series) 04/03/2022 (Originally 02/09/2021)   Pneumococcal Vaccine 70-49 Years old (1 - PCV) 06/26/2022 (Originally 01/08/1962)   COLONOSCOPY (Pts 45-50yrs Insurance coverage will need to be confirmed)  09/20/2024   TETANUS/TDAP  05/28/2027   INFLUENZA VACCINE  Completed  Hepatitis C Screening  Completed   HPV VACCINES  Aged Out    Health Maintenance  Health Maintenance Due  Topic Date Due   HIV Screening  Never done   Zoster Vaccines- Shingrix (1 of 2) Never done    Colorectal cancer screening: Type of screening: Colonoscopy. Completed 09/20/2021. Repeat every 3 years  Lung Cancer Screening: (Low Dose CT Chest recommended if Age  81-80 years, 30 pack-year currently smoking OR have quit w/in 15years.) does qualify.   Lung Cancer Screening Referral: Declined  Additional Screening:  Hepatitis C Screening: does qualify; Completed 01/09/2016  Vision Screening: Recommended annual ophthalmology exams for early detection of glaucoma and other disorders of the eye. Is the patient up to date with their annual eye exam?  Yes  Who is the provider or what is the name of the office in which the patient attends annual eye exams? [facility] If pt is not established with a provider, would they like to be referred to a provider to establish care? No .   Dental Screening: Recommended annual dental exams for proper oral hygiene  Community Resource Referral / Chronic Care Management: CRR required this visit?  No   CCM required this visit?  No      Plan:     I have personally reviewed and noted the following in the patient's chart:   Medical and social history Use of alcohol, tobacco or illicit drugs  Current medications and supplements including opioid prescriptions. Patient is not currently taking opioid prescriptions. Functional ability and status Nutritional status Physical activity Advanced directives List of other physicians Hospitalizations, surgeries, and ER visits in previous 12 months Vitals Screenings to include cognitive, depression, and falls Referrals and appointments  In addition, I have reviewed and discussed with patient certain preventive protocols, quality metrics, and best practice recommendations. A written personalized care plan for preventive services as well as general preventive health recommendations were provided to patient.     Derrick Driver, LPN   [id number]   Nurse Notes: Up to date on health maintenance. Discussed Shingles and Pneumococcal vaccines.

## 2021-10-28 NOTE — Patient Instructions (Signed)
Derrick [name] , Thank you for taking time to come for your Medicare Wellness Visit. I appreciate your ongoing commitment to your health goals. Please review the following plan we discussed and let me know if I can assist you in the future.   Screening recommendations/referrals: Colonoscopy: Done 09/20/2021. Due every 3 years.  Recommended yearly ophthalmology/optometry visit for glaucoma screening and checkup Recommended yearly dental visit for hygiene and checkup  Vaccinations: Influenza vaccine: Done 09/26/2021 Pneumococcal vaccine: Due at age 4.  Tdap vaccine: Done 05/27/2017 Repeat in 10 years  Shingles vaccine: Shingrix discussed. Please contact your pharmacy for coverage information.     Covid-19: Done 04/12/2020 and 12/15/2020  Advanced directives: Advance directive discussed with you today. Even though you declined this today, please call our office should you change your mind, and we can give you the proper paperwork for you to fill out.   Conditions/risks identified: Aim for 30 minutes of exercise or brisk walking each day, drink 6-8 glasses of water and eat lots of fruits and vegetables. KEEP UP THE GOOD WORK!!  Next appointment: Follow up in one year for your annual wellness visit. 2023.  Preventive Care 75 Years and Older, Male  Preventive care refers to lifestyle choices and visits with your health care provider that can promote health and wellness. What does preventive care include? A yearly physical exam. This is also called an annual well check. Dental exams once or twice a year. Routine eye exams. Ask your health care provider how often you should have your eyes checked. Personal lifestyle choices, including: Daily care of your teeth and gums. Regular physical activity. Eating a healthy diet. Avoiding tobacco and drug use. Limiting alcohol use. Practicing safe sex. Taking low doses of aspirin every day. Taking vitamin and mineral supplements as recommended by your  health care provider. What happens during an annual well check? The services and screenings done by your health care provider during your annual well check will depend on your age, overall health, lifestyle risk factors, and family history of disease. Counseling  Your health care provider may ask you questions about your: Alcohol use. Tobacco use. Drug use. Emotional well-being. Home and relationship well-being. Sexual activity. Eating habits. History of falls. Memory and ability to understand (cognition). Work and work Statistician. Screening  You may have the following tests or measurements: Height, weight, and BMI. Blood pressure. Lipid and cholesterol levels. These may be checked every 5 years, or more frequently if you are over 39 years old. Skin check. Lung cancer screening. You may have this screening every year starting at age 58 if you have a 30-pack-year history of smoking and currently smoke or have quit within the past 15 years. Fecal occult blood test (FOBT) of the stool. You may have this test every year starting at age 51. Flexible sigmoidoscopy or colonoscopy. You may have a sigmoidoscopy every 5 years or a colonoscopy every 10 years starting at age 39. Prostate cancer screening. Recommendations will vary depending on your family history and other risks. Hepatitis C blood test. Hepatitis B blood test. Sexually transmitted disease (STD) testing. Diabetes screening. This is done by checking your blood sugar (glucose) after you have not eaten for a while (fasting). You may have this done every 1-3 years. Abdominal aortic aneurysm (AAA) screening. You may need this if you are a current or former smoker. Osteoporosis. You may be screened starting at age 70 if you are at high risk. Talk with your health care provider about your  test results, treatment options, and if necessary, the need for more tests. Vaccines  Your health care provider may recommend certain vaccines, such  as: Influenza vaccine. This is recommended every year. Tetanus, diphtheria, and acellular pertussis (Tdap, Td) vaccine. You may need a Td booster every 10 years. Zoster vaccine. You may need this after age 76. Pneumococcal 13-valent conjugate (PCV13) vaccine. One dose is recommended after age 24. Pneumococcal polysaccharide (PPSV23) vaccine. One dose is recommended after age 20. Talk to your health care provider about which screenings and vaccines you need and how often you need them. This information is not intended to replace advice given to you by your health care provider. Make sure you discuss any questions you have with your health care provider. Document Released: 12/28/2015 Document Revised: 08/20/2016 Document Reviewed: 10/02/2015 Elsevier Interactive Patient Education  2017 [employer] Prevention in the Home Falls can cause injuries. They can happen to people of all ages. There are many things you can do to make your home safe and to help prevent falls. What can I do on the outside of my home? Regularly fix the edges of walkways and driveways and fix any cracks. Remove anything that might make you trip as you walk through a door, such as a raised step or threshold. Trim any bushes or trees on the path to your home. Use bright outdoor lighting. Clear any walking paths of anything that might make someone trip, such as rocks or tools. Regularly check to see if handrails are loose or broken. Make sure that both sides of any steps have handrails. Any raised decks and porches should have guardrails on the edges. Have any leaves, snow, or ice cleared regularly. Use sand or salt on walking paths during winter. Clean up any spills in your garage right away. This includes oil or grease spills. What can I do in the bathroom? Use night lights. Install grab bars by the toilet and in the tub and shower. Do not use towel bars as grab bars. Use non-skid mats or decals in the tub or  shower. If you need to sit down in the shower, use a plastic, non-slip stool. Keep the floor dry. Clean up any water that spills on the floor as soon as it happens. Remove soap buildup in the tub or shower regularly. Attach bath mats securely with double-sided non-slip rug tape. Do not have throw rugs and other things on the floor that can make you trip. What can I do in the bedroom? Use night lights. Make sure that you have a light by your bed that is easy to reach. Do not use any sheets or blankets that are too big for your bed. They should not hang down onto the floor. Have a firm chair that has side arms. You can use this for support while you get dressed. Do not have throw rugs and other things on the floor that can make you trip. What can I do in the kitchen? Clean up any spills right away. Avoid walking on wet floors. Keep items that you use a lot in easy-to-reach places. If you need to reach something above you, use a strong step stool that has a grab bar. Keep electrical cords out of the way. Do not use floor polish or wax that makes floors slippery. If you must use wax, use non-skid floor wax. Do not have throw rugs and other things on the floor that can make you trip. What can I do with my  stairs? Do not leave any items on the stairs. Make sure that there are handrails on both sides of the stairs and use them. Fix handrails that are broken or loose. Make sure that handrails are as long as the stairways. Check any carpeting to make sure that it is firmly attached to the stairs. Fix any carpet that is loose or worn. Avoid having throw rugs at the top or bottom of the stairs. If you do have throw rugs, attach them to the floor with carpet tape. Make sure that you have a light switch at the top of the stairs and the bottom of the stairs. If you do not have them, ask someone to add them for you. What else can I do to help prevent falls? Wear shoes that: Do not have high heels. Have  rubber bottoms. Are comfortable and fit you well. Are closed at the toe. Do not wear sandals. If you use a stepladder: Make sure that it is fully opened. Do not climb a closed stepladder. Make sure that both sides of the stepladder are locked into place. Ask someone to hold it for you, if possible. Clearly mark and make sure that you can see: Any grab bars or handrails. First and last steps. Where the edge of each step is. Use tools that help you move around (mobility aids) if they are needed. These include: Canes. Walkers. Scooters. Crutches. Turn on the lights when you go into a dark area. Replace any light bulbs as soon as they burn out. Set up your furniture so you have a clear path. Avoid moving your furniture around. If any of your floors are uneven, fix them. If there are any pets around you, be aware of where they are. Review your medicines with your doctor. Some medicines can make you feel dizzy. This can increase your chance of falling. Ask your doctor what other things that you can do to help prevent falls. This information is not intended to replace advice given to you by your health care provider. Make sure you discuss any questions you have with your health care provider. Document Released: 09/27/2009 Document Revised: 05/08/2016 Document Reviewed: 01/05/2015 Elsevier Interactive Patient Education  2017 [employer].

## 2021-10-29 ENCOUNTER — Other Ambulatory Visit: Payer: Self-pay | Admitting: Family Medicine

## 2021-10-31 ENCOUNTER — Other Ambulatory Visit: Payer: Self-pay | Admitting: Family Medicine

## 2021-11-01 NOTE — Telephone Encounter (Signed)
Medication has not been prescribed by you. Please advise

## 2021-11-14 ENCOUNTER — Ambulatory Visit: Payer: Medicare HMO | Admitting: Psychiatry

## 2021-11-14 ENCOUNTER — Other Ambulatory Visit: Payer: Self-pay

## 2021-11-14 DIAGNOSIS — F1211 Cannabis abuse, in remission: Secondary | ICD-10-CM

## 2021-11-14 DIAGNOSIS — Z630 Problems in relationship with spouse or partner: Secondary | ICD-10-CM

## 2021-11-14 DIAGNOSIS — F902 Attention-deficit hyperactivity disorder, combined type: Secondary | ICD-10-CM | POA: Diagnosis not present

## 2021-11-14 DIAGNOSIS — F068 Other specified mental disorders due to known physiological condition: Secondary | ICD-10-CM | POA: Diagnosis not present

## 2021-11-14 DIAGNOSIS — F411 Generalized anxiety disorder: Secondary | ICD-10-CM | POA: Diagnosis not present

## 2021-11-14 DIAGNOSIS — F319 Bipolar disorder, unspecified: Secondary | ICD-10-CM | POA: Diagnosis not present
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
## 2021-12-03 ENCOUNTER — Ambulatory Visit (INDEPENDENT_AMBULATORY_CARE_PROVIDER_SITE_OTHER): Payer: Medicare HMO | Admitting: Licensed Clinical Social Worker

## 2021-12-03 DIAGNOSIS — F5105 Insomnia due to other mental disorder: Secondary | ICD-10-CM

## 2021-12-03 DIAGNOSIS — E7800 Pure hypercholesterolemia, unspecified: Secondary | ICD-10-CM

## 2021-12-03 DIAGNOSIS — R413 Other amnesia: Secondary | ICD-10-CM

## 2021-12-03 DIAGNOSIS — F3170 Bipolar disorder, currently in remission, most recent episode unspecified: Secondary | ICD-10-CM

## 2021-12-03 DIAGNOSIS — G4733 Obstructive sleep apnea (adult) (pediatric): Secondary | ICD-10-CM

## 2021-12-03 NOTE — Patient Instructions (Addendum)
Visit Information  Patient Goals:  Protect My Health (Patient). Manage anxiety issues. Manage depression issues  Timeframe:  Short-Term Goal Priority:  Medium Progress: On Track Start Date:         12/03/21               Expected End Date:       02/28/22           Follow Up Date  01/29/22 at 3:00 PM   Protect My Health (Patient) Manage anxiety issues; Manage depression issues   Why is this important?   Screening tests can find diseases early when they are easier to treat.  Your doctor or nurse will talk with you about which tests are important for you.  Getting shots for common diseases like the flu and shingles will help prevent them.     Patient Self Care Activities:  Self administers medications as prescribed Attends all scheduled provider appointments Performs ADL's independently  Patient Coping Strengths:  Family Friends  Patient Self Care Deficits:  Depression issues Anxiety issues  Patient Goals:  - spend time or talk with others at least 2 to 3 times per week - practice relaxation or meditation daily - keep a calendar with appointment dates  Follow Up Plan: LCSW to call client or spouse of client, Orazio Weller, on 01/29/22 at 3:00 PM to assess client needs    [name].Tnya Ades MSW, LCSW Licensed Clinical Social Worker [facility] Care Management [phone]
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
## 2021-12-14 DIAGNOSIS — E7800 Pure hypercholesterolemia, unspecified: Secondary | ICD-10-CM

## 2021-12-23 ENCOUNTER — Ambulatory Visit: Payer: Medicare HMO | Admitting: Physician Assistant

## 2022-01-06 ENCOUNTER — Ambulatory Visit (INDEPENDENT_AMBULATORY_CARE_PROVIDER_SITE_OTHER): Payer: Medicare HMO | Admitting: Nurse Practitioner

## 2022-01-06 ENCOUNTER — Encounter: Payer: Self-pay | Admitting: Nurse Practitioner

## 2022-01-06 ENCOUNTER — Ambulatory Visit: Payer: Medicare HMO | Admitting: Psychiatry

## 2022-01-06 DIAGNOSIS — M791 Myalgia, unspecified site: Secondary | ICD-10-CM | POA: Diagnosis not present

## 2022-01-06 DIAGNOSIS — R5383 Other fatigue: Secondary | ICD-10-CM | POA: Diagnosis not present

## 2022-01-06 NOTE — Patient Instructions (Signed)
1. Take meds as prescribed 2. Use a cool mist humidifier especially during the winter months and when heat has been humid. 3. Use saline nose sprays frequently 4. Saline irrigations of the nose can be very helpful if done frequently.  * 4X daily for 1 week*  * Use of a nettie pot can be helpful with this. Follow directions with this* 5. Drink plenty of fluids 6. Keep thermostat turn down low 7.For any cough or congestion- mucinex or delsym OTC 8. For fever or aces or pains- take tylenol or ibuprofen appropriate for age and weight.  * for fevers greater than 101 orally you may alternate ibuprofen and tylenol every  3 hours.

## 2022-01-06 NOTE — Progress Notes (Signed)
Virtual Visit  Note Due to COVID-19 pandemic this visit was conducted virtually. This visit type was conducted due to national recommendations for restrictions regarding the COVID-19 Pandemic (e.g. social distancing, sheltering in place) in an effort to limit this patient's exposure and mitigate transmission in our community. All issues noted in this document were discussed and addressed.  A physical exam was not performed with this format.  I connected with Derrick [name] on 01/06/22 at 8:38 by telephone and verified that I am speaking with the correct person using two identifiers. Derrick [name] is currently located at home and no on is currently with him during visit. The provider, Mary-Margaret Hassell Done, [name] is located in their office at time of visit.  I discussed the limitations, risks, security and privacy concerns of performing an evaluation and management service by telephone and the availability of in person appointments. I also discussed with the patient that there may be a patient responsible charge related to this service. The patient expressed understanding and agreed to proceed.   History and Present Illness:  Patient states that he is feeling achy chills the last 2 days. F\Feels really tired with headache that started today. Has had diarrhea. He has not taken anything OTC.     Review of Systems  Constitutional:  Positive for malaise/fatigue. Negative for chills and fever.  HENT:  Positive for congestion and sore throat (but has resolved today).   Respiratory:  Negative for cough, sputum production and shortness of breath.   Musculoskeletal:  Positive for myalgias.  Neurological:  Positive for headaches.    Observations/Objective: Alert and oriented- answers all questions appropriately No distress   Assessment and Plan: Derrick [name] in today with chief complaint of No chief complaint on file.   1. Other fatigue - Veritor Flu A/B Waived  2. Myalgia - Veritor Flu  A/B Waived  Waiting for patient to come in for flu swab. 1. Take meds as prescribed 2. Use a cool mist humidifier especially during the winter months and when heat has been humid. 3. Use saline nose sprays frequently 4. Saline irrigations of the nose can be very helpful if done frequently.  * 4X daily for 1 week*  * Use of a nettie pot can be helpful with this. Follow directions with this* 5. Drink plenty of fluids 6. Keep thermostat turn down low 7.For any cough or congestion- mucinex OTC 8. For fever or aces or pains- take tylenol or ibuprofen appropriate for age and weight.  * for fevers greater than 101 orally you may alternate ibuprofen and tylenol every  3 hours.     Follow Up Instructions: prn    I discussed the assessment and treatment plan with the patient. The patient was provided an opportunity to ask questions and all were answered. The patient agreed with the plan and demonstrated an understanding of the instructions.   The patient was advised to call back or seek an in-person evaluation if the symptoms worsen or if the condition fails to improve as anticipated.  The above assessment and management plan was discussed with the patient. The patient verbalized understanding of and has agreed to the management plan. Patient is aware to call the clinic if symptoms persist or worsen. Patient is aware when to return to the clinic for a follow-up visit. Patient educated on when it is appropriate to go to the emergency department.   Time call ended:  2:15 - was waiting for him to come in for  flu swab but has not arrived yet.  I provided 12 minutes of  non face-to-face time during this encounter.    Mary-Margaret Hassell Done, [name]

## 2022-01-07 DIAGNOSIS — M791 Myalgia, unspecified site: Secondary | ICD-10-CM | POA: Diagnosis not present

## 2022-01-07 DIAGNOSIS — R5383 Other fatigue: Secondary | ICD-10-CM | POA: Diagnosis not present

## 2022-01-07 LAB — VERITOR FLU A/B WAIVED
Influenza A: NEGATIVE
Influenza B: NEGATIVE

## 2022-01-09 ENCOUNTER — Telehealth: Payer: Self-pay | Admitting: Family Medicine

## 2022-01-09 NOTE — Telephone Encounter (Signed)
Patient reports diarrhea on and off for about three weeks, worse after he eats.  He has an appointment scheduled with the GI doctor in February but does not want to wait until then to see someone so he has scheduled an appointment here tomorrow.  I advised patient that if he feels he cannot stand it he can try immodium tonight but it is not necessary with his appointment tomorrow.  Advised him that even if he is unable to eat anything without having a bowel movement, he should keep himself hydrated and we will see him tomorrow.

## 2022-01-09 NOTE — Telephone Encounter (Signed)
Diarrhea for a few days. Every time he eats, he goes to the bathroom. Made appt for 1/27 but patient wants to know if there is anything else that he can try tonight.

## 2022-01-10 ENCOUNTER — Ambulatory Visit (INDEPENDENT_AMBULATORY_CARE_PROVIDER_SITE_OTHER): Payer: Medicare HMO | Admitting: Family

## 2022-01-10 ENCOUNTER — Encounter: Payer: Self-pay | Admitting: Family

## 2022-01-10 VITALS — BP 98/55 | HR 64 | Temp 97.9°F | Ht 71.0 in | Wt 175.0 lb

## 2022-01-10 DIAGNOSIS — R197 Diarrhea, unspecified: Secondary | ICD-10-CM | POA: Diagnosis not present

## 2022-01-10 LAB — CBC WITH DIFFERENTIAL/PLATELET
Basophils Absolute: 0 10*3/uL (ref 0.0–0.2)
Basos: 0 %
EOS (ABSOLUTE): 0.1 10*3/uL (ref 0.0–0.4)
Eos: 1 %
Hematocrit: 39.1 % (ref 37.5–51.0)
Hemoglobin: 14.1 g/dL (ref 13.0–17.7)
Immature Grans (Abs): 0 10*3/uL (ref 0.0–0.1)
Immature Granulocytes: 1 %
Lymphocytes Absolute: 2.5 10*3/uL (ref 0.7–3.1)
Lymphs: 42 %
MCH: 33.3 pg — ABNORMAL HIGH (ref 26.6–33.0)
MCHC: 36.1 g/dL — ABNORMAL HIGH (ref 31.5–35.7)
MCV: 92 fL (ref 79–97)
Monocytes Absolute: 0.6 10*3/uL (ref 0.1–0.9)
Monocytes: 11 %
Neutrophils Absolute: 2.6 10*3/uL (ref 1.4–7.0)
Neutrophils: 45 %
Platelets: 145 10*3/uL — ABNORMAL LOW (ref 150–450)
RBC: 4.23 x10E6/uL (ref 4.14–5.80)
RDW: 12.5 % (ref 11.6–15.4)
WBC: 5.8 10*3/uL (ref 3.4–10.8)

## 2022-01-10 NOTE — Progress Notes (Signed)
Subjective:    Patient ID: Derrick [name], male    DOB: 1956/12/08, 66 y.o.   MRN: [id number]  Chief Complaint  Patient presents with   Diarrhea    Been going on for a week denies N&V   PT presents to the office today with complaints of 5-10 episodes of diarrhea a day that started over a week. He has taken a home COVID test that was negative.  Diarrhea  This is a new problem. The current episode started 1 to 4 weeks ago. The problem occurs 5 to 10 times per day. The problem has been waxing and waning. The stool consistency is described as Watery. The patient states that diarrhea awakens him from sleep. Associated symptoms include chills, headaches and myalgias. Pertinent negatives include no bloating, fever, increased  flatus or vomiting. Nothing aggravates the symptoms. There are no known risk factors. He has tried nothing for the symptoms. The treatment provided mild relief.     Review of Systems  Constitutional:  Positive for chills. Negative for fever.  Gastrointestinal:  Positive for diarrhea. Negative for bloating, flatus and vomiting.  Musculoskeletal:  Positive for myalgias.  Neurological:  Positive for headaches.  All other systems reviewed and are negative.     Objective:   Physical Exam Vitals reviewed.  Constitutional:      General: He is not in acute distress.    Appearance: He is well-developed.  HENT:     Head: Normocephalic.     Right Ear: External ear normal.     Left Ear: External ear normal.  Eyes:     General:        Right eye: No discharge.        Left eye: No discharge.     Pupils: Pupils are equal, round, and reactive to light.  Neck:     Thyroid: No thyromegaly.  Cardiovascular:     Rate and Rhythm: Normal rate and regular rhythm.     Heart sounds: Normal heart sounds. No murmur heard. Pulmonary:     Effort: Pulmonary effort is normal. No respiratory distress.     Breath sounds: Normal breath sounds. No wheezing.  Abdominal:     General: Bowel  sounds are normal. There is no distension.     Palpations: Abdomen is soft.     Tenderness: There is no abdominal tenderness.  Musculoskeletal:        General: No tenderness. Normal range of motion.     Cervical back: Normal range of motion and neck supple.  Skin:    General: Skin is warm and dry.     Findings: No erythema or rash.  Neurological:     Mental Status: He is alert and oriented to person, place, and time.     Cranial Nerves: No cranial nerve deficit.     Deep Tendon Reflexes: Reflexes are normal and symmetric.  Psychiatric:        Behavior: Behavior normal.        Thought Content: Thought content normal.        Judgment: Judgment normal.    BP (!) 98/55    Pulse 64    Temp 97.9 F (36.6 C) (Temporal)    Ht 5\' 11"  (1.803 m)    Wt 175 lb (79.4 kg)    BMI 24.41 kg/m        Assessment & Plan:  KOY LAMP comes in today with chief complaint of Diarrhea (Been going on for a week denies N&V)  Diagnosis and orders addressed:  1. Diarrhea, unspecified type BRAT diet Force fluids Stool panel pending Imodium as needed  Follow up if symptoms worsen or do not improve  - Cdiff NAA+O+P+Stool Culture - CBC with Differential/Platelet   Evelina Dun, [name]

## 2022-01-10 NOTE — Patient Instructions (Addendum)
Diarrhea, Adult  Imodium  Diarrhea is frequent loose and watery bowel movements. Diarrhea can make you feel weak and cause you to become dehydrated. Dehydration can make you tired and thirsty, cause you to have a dry mouth, and decrease how often you urinate. Diarrhea typically lasts 2-3 days. However, it can last longer if it is a sign of something more serious. It is important to treat your diarrhea as told by your health care provider. Follow these instructions at home: Eating and drinking   Follow these recommendations as told by your health care provider: Take an oral rehydration solution (ORS). This is an over-the-counter medicine that helps return your body to its normal balance of nutrients and water. It is found at pharmacies and retail stores. Drink plenty of fluids, such as water, ice chips, diluted fruit juice, and low-calorie sports drinks. You can drink milk also, if desired. Avoid drinking fluids that contain a lot of sugar or caffeine, such as energy drinks, sports drinks, and soda. Eat bland, easy-to-digest foods in small amounts as you are able. These foods include bananas, applesauce, rice, lean meats, toast, and crackers. Avoid alcohol. Avoid spicy or fatty foods.  Medicines Take over-the-counter and prescription medicines only as told by your health care provider. If you were prescribed an antibiotic medicine, take it as told by your health care provider. Do not stop using the antibiotic even if you start to feel better. General instructions  Wash your hands often using soap and water. If soap and water are not available, use a hand sanitizer. Others in the household should wash their hands as well. Hands should be washed: After using the toilet or changing a diaper. Before preparing, cooking, or serving food. While caring for a sick person or while visiting someone in a hospital. Drink enough fluid to keep your urine pale yellow. Rest at home while you recover. Watch  your condition for any changes. Take a warm bath to relieve any burning or pain from frequent diarrhea episodes. Keep all follow-up visits as told by your health care provider. This is important. Contact a health care provider if: You have a fever. Your diarrhea gets worse. You have new symptoms. You cannot keep fluids down. You feel light-headed or dizzy. You have a headache. You have muscle cramps. Get help right away if: You have chest pain. You feel extremely weak or you faint. You have bloody or black stools or stools that look like tar. You have severe pain, cramping, or bloating in your abdomen. You have trouble breathing or you are breathing very quickly. Your heart is beating very quickly. Your skin feels cold and clammy. You feel confused. You have signs of dehydration, such as: Dark urine, very little urine, or no urine. Cracked lips. Dry mouth. Sunken eyes. Sleepiness. Weakness. Summary Diarrhea is frequent loose and sometimes watery bowel movements. Diarrhea can make you feel weak and cause you to become dehydrated. Drink enough fluids to keep your urine pale yellow. Make sure that you wash your hands after using the toilet. If soap and water are not available, use hand sanitizer. Contact a health care provider if your diarrhea gets worse or you have new symptoms. Get help right away if you have signs of dehydration. This information is not intended to replace advice given to you by your health care provider. Make sure you discuss any questions you have with your health care provider. Document Revised: 06/12/2021 Document Reviewed: 06/12/2021 Elsevier Patient Education  [place].

## 2022-01-13 ENCOUNTER — Other Ambulatory Visit: Payer: Medicare HMO

## 2022-01-13 DIAGNOSIS — R197 Diarrhea, unspecified: Secondary | ICD-10-CM | POA: Diagnosis not present

## 2022-01-14 ENCOUNTER — Ambulatory Visit: Payer: Medicare HMO | Admitting: Physician Assistant

## 2022-01-14 ENCOUNTER — Encounter: Payer: Self-pay | Admitting: Physician Assistant

## 2022-01-14 ENCOUNTER — Other Ambulatory Visit: Payer: Self-pay

## 2022-01-14 DIAGNOSIS — F319 Bipolar disorder, unspecified: Secondary | ICD-10-CM | POA: Diagnosis not present

## 2022-01-14 DIAGNOSIS — F411 Generalized anxiety disorder: Secondary | ICD-10-CM | POA: Diagnosis not present

## 2022-01-14 DIAGNOSIS — F068 Other specified mental disorders due to known physiological condition: Secondary | ICD-10-CM | POA: Diagnosis not present

## 2022-01-14 DIAGNOSIS — Z9889 Other specified postprocedural states: Secondary | ICD-10-CM | POA: Diagnosis not present

## 2022-01-14 MED ORDER — LAMOTRIGINE 100 MG PO TABS: ORAL_TABLET | ORAL | 3 refills | Status: DC

## 2022-01-14 MED ORDER — DIVALPROEX SODIUM 500 MG PO DR TAB: 2000.0000 mg | DELAYED_RELEASE_TABLET | Freq: Every day | ORAL | 3 refills | Status: DC

## 2022-01-14 MED ORDER — ESCITALOPRAM OXALATE 20 MG PO TABS: 20.0000 mg | ORAL_TABLET | Freq: Every day | ORAL | 3 refills | Status: DC

## 2022-01-14 NOTE — Progress Notes (Signed)
[facility] Med Check  Patient ID: Derrick [name],  MRN: [id number]  PCP: [name], [name]  Date of Evaluation: 01/14/2022 Time spent:30 minutes  Chief Complaint:  Chief Complaint   Follow-up     HISTORY/CURRENT STATUS: HPI for routine med check.  Still gets irritable 'pretty quick.' He and his wife don't sit in the same room or he gets too aggravated about everything. Doesn't go much of anywhere b/c he gets frustrated w/ people easily and just prefers not to go out.  Gets more anxious if he watches the news.  He tries to avoid that.  But overall he feels like his meds are keeping his mood good and he's not having any problems.  Has not been able to get his labs drawn yet.  He went to a place where he used to go and they closed.  That was very aggravating to him so he went home.  He is able to enjoy things, watches TV and pills around the house.  Energy and motivation are pretty good.  Sleeps okay.  Personal hygiene is normal.  No suicidal or homicidal thoughts.  Patient denies increased energy with decreased need for sleep, no increased talkativeness, no racing thoughts, no impulsivity or risky behaviors, no increased spending, no increased libido, no grandiosity, and no hallucinations.  Denies dizziness, syncope, seizures, numbness, tingling, tremor, tics, unsteady gait, slurred speech, confusion. Denies muscle or joint pain, stiffness, or dystonia.  Individual Medical History/ Review of Systems: Changes? :Yes      Had colonoscopy in Oct, had 8 benign polyps and will go back in 3 years.    Past medications for mental health diagnoses include: Zoloft, Prozac, Effexor XR, Lexapro, Depakote, Lamictal, Wellbutrin, Xanax, Ambien, Sonata, Klonopin  Allergies: Patient has no known allergies.  Current Medications:  Current Outpatient Medications:    aspirin EC 81 MG tablet, Take 81 mg by mouth daily. Swallow whole., Disp: , Rfl:    atorvastatin (LIPITOR) 80 MG tablet, Take 1  tablet (80 mg total) by mouth daily., Disp: 90 tablet, Rfl: 3   ezetimibe (ZETIA) 10 MG tablet, Take 1 tablet (10 mg total) by mouth daily., Disp: 90 tablet, Rfl: 1   isosorbide mononitrate (IMDUR) 30 MG 24 hr tablet, Take 0.5 tablets (15 mg total) by mouth daily., Disp: 45 tablet, Rfl: 3   metoprolol tartrate (LOPRESSOR) 25 MG tablet, Take 1 tablet (25 mg total) by mouth 2 (two) times daily., Disp: 180 tablet, Rfl: 3   montelukast (SINGULAIR) 10 MG tablet, TAKE 1 TABLET (10 MG TOTAL) BY MOUTH DAILY., Disp: 90 tablet, Rfl: 0   nitroGLYCERIN (NITROSTAT) 0.4 MG SL tablet, Place 1 tablet (0.4 mg total) under the tongue every 5 (five) minutes as needed for chest pain., Disp: 25 tablet, Rfl: 3   pantoprazole (PROTONIX) 40 MG tablet, Take 1 tablet (40 mg total) by mouth daily., Disp: 90 tablet, Rfl: 3   VITAMIN D, CHOLECALCIFEROL, PO, Take 65 mcg by mouth daily., Disp: , Rfl:    divalproex (DEPAKOTE) 500 MG DR tablet, Take 4 tablets (2,000 mg total) by mouth at bedtime., Disp: 360 tablet, Rfl: 3   escitalopram (LEXAPRO) 20 MG tablet, Take 1 tablet (20 mg total) by mouth daily., Disp: 90 tablet, Rfl: 3   lamoTRIgine (LAMICTAL) 100 MG tablet, 1.5 pills daily., Disp: 135 tablet, Rfl: 3   tamsulosin (FLOMAX) 0.4 MG CAPS capsule, TAKE 1 CAPSULE EVERY DAY (Patient not taking: Reported on 01/10/2022), Disp: 90 capsule, Rfl: 1 Medication Side Effects: none  Family Medical/ Social History: Changes? no  MENTAL HEALTH EXAM:  There were no vitals taken for this visit.There is no height or weight on file to calculate BMI.  General Appearance: Casual and Well Groomed  Eye Contact:  Good  Speech:  Clear and Coherent and Normal Rate  Volume:  Normal  Mood:  Anxious  Affect:  Labile and Anxious  Thought Process:  Goal Directed and Descriptions of Associations: Intact  Orientation:  Full (Time, Place, and Person)  Thought Content: Logical   Suicidal Thoughts:  No  Homicidal Thoughts:  No  Memory:  WNL   Judgement:  Good  Insight:  Good  Psychomotor Activity:  Restlessness and shakes his legs    Concentration:  Concentration: Good  Recall:  Good  Fund of Knowledge: Good  Language: Good  Assets:  Desire for Improvement  ADL's:  Intact  Cognition: WNL  Prognosis:  Good   Labs   01/10/2022 Plts 145   09/20/2021 CMP nl   No recent Depakote level  DIAGNOSES:    ICD-10-CM   1. Bipolar I disorder ([place])  F31.9     2. Generalized anxiety disorder  F41.1     3. hx craniotomy for subdural hematoma  Z98.890     4. Cognitive dysfunction in bipolar disorder ([name])  F06.8    F31.9        Receiving Psychotherapy: Yes with Dr. [name]   RECOMMENDATIONS:  PDMP reviewed. No controlled substances since 01/30/2021 I provided 30 minutes of face to face time during this encounter, including time spent before and after the visit in records review, medical decision making, counseling pertinent to today's visit, and charting.  He is doing well for the most part.  No changes in meds will be made. He will get his labs drawn sometime within the next few weeks.  Need Depakote level and Lamictal level.  He has the orders. Cont Depakote DR 500 mg, 4 p.o. nightly. Cont. Lamictal 100 mg, 1-1/2 pills nightly. Continue Lexapro 20 mg p.o. daily. Continue therapy with Dr. [name]. Return in 3 months.  Donnal Moat, [name]

## 2022-01-17 LAB — CDIFF NAA+O+P+STOOL CULTURE
E coli, Shiga toxin Assay: NEGATIVE
Toxigenic C. Difficile by PCR: NEGATIVE

## 2022-01-21 DIAGNOSIS — B070 Plantar wart: Secondary | ICD-10-CM | POA: Diagnosis not present

## 2022-01-21 DIAGNOSIS — M79671 Pain in right foot: Secondary | ICD-10-CM | POA: Diagnosis not present

## 2022-01-22 ENCOUNTER — Ambulatory Visit: Payer: Medicare HMO | Admitting: Gastroenterology

## 2022-01-29 ENCOUNTER — Telehealth: Payer: Medicare HMO

## 2022-01-29 ENCOUNTER — Telehealth: Payer: Self-pay | Admitting: Licensed Clinical Social Worker

## 2022-02-11 ENCOUNTER — Telehealth: Payer: Self-pay

## 2022-02-11 DIAGNOSIS — B070 Plantar wart: Secondary | ICD-10-CM | POA: Diagnosis not present

## 2022-02-11 DIAGNOSIS — M79671 Pain in right foot: Secondary | ICD-10-CM | POA: Diagnosis not present

## 2022-02-11 NOTE — Chronic Care Management (AMB) (Unsigned)
Care Management   Note  02/11/2022 Name: CYLAN FOUCAULT MRN: [id number] DOB: [birth year]  AMAD FORMOSO is a 66 y.o. year old male who is a primary care patient of Gabriel Earing, [name] and is actively engaged with the care management team. I reached out to Edrick Kins by phone today to assist with re-scheduling a follow up visit with the Licensed Clinical Social Worker  Follow up plan: Unsuccessful telephone outreach attempt made. A HIPAA compliant phone message was left for the patient providing contact information and requesting a return call.  The care management team will reach out to the patient again over the next 5 days.  If patient returns call to provider office, please advise to call Embedded Care Management Care Guide Penne Lash  at [phone]  Penne Lash, RMA Care Guide, Embedded Care Coordination [facility]  [place], [place] [address] Direct Dial: [phone] [email or web address]

## 2022-02-13 ENCOUNTER — Ambulatory Visit: Payer: Medicare HMO | Admitting: Psychiatry

## 2022-02-14 ENCOUNTER — Telehealth: Payer: Self-pay | Admitting: Physician Assistant

## 2022-02-14 NOTE — Telephone Encounter (Signed)
Pt lvm that the fax number is [phone] attention tiffany ?

## 2022-02-14 NOTE — Telephone Encounter (Signed)
Pt lvm and said that he lost his labs that teresa gave him and he would like her to fax them to his family doctor so that he can have them done there. He said that she handwrote out the labs. Please call him to get the fax number for where they need to be sent . His number is [phone] ?

## 2022-02-14 NOTE — Telephone Encounter (Signed)
Please print or re write order and we can fax

## 2022-02-17 NOTE — Telephone Encounter (Signed)
Would you please print out the labs from 09/19/2021 and fax them to the number above?  And then let him know that they have been faxed.  Thank you.

## 2022-02-17 NOTE — Telephone Encounter (Signed)
Yes ma'am. Will do ?

## 2022-02-24 ENCOUNTER — Other Ambulatory Visit: Payer: Medicare HMO

## 2022-02-24 ENCOUNTER — Other Ambulatory Visit: Payer: Self-pay

## 2022-02-24 ENCOUNTER — Encounter: Payer: Self-pay | Admitting: Gastroenterology

## 2022-02-24 ENCOUNTER — Ambulatory Visit (INDEPENDENT_AMBULATORY_CARE_PROVIDER_SITE_OTHER): Payer: Medicare HMO | Admitting: Gastroenterology

## 2022-02-24 VITALS — BP 120/78 | HR 75 | Ht 71.0 in | Wt 180.5 lb

## 2022-02-24 DIAGNOSIS — Z79899 Other long term (current) drug therapy: Secondary | ICD-10-CM | POA: Diagnosis not present

## 2022-02-24 DIAGNOSIS — R197 Diarrhea, unspecified: Secondary | ICD-10-CM

## 2022-02-24 DIAGNOSIS — Z800 Family history of malignant neoplasm of digestive organs: Secondary | ICD-10-CM | POA: Diagnosis not present

## 2022-02-24 DIAGNOSIS — Z86010 Personal history of colonic polyps: Secondary | ICD-10-CM

## 2022-02-24 DIAGNOSIS — F319 Bipolar disorder, unspecified: Secondary | ICD-10-CM | POA: Diagnosis not present

## 2022-02-24 NOTE — Progress Notes (Signed)
Chief Complaint: FU  Referring Provider:  [name], [name]      ASSESSMENT AND PLAN;   #1. Diarrhea. ?etiology.   #2. FH colon Ca (sis at age 66).   #3. H/O polyps (SSA, TA), rpt colon Oct 2025.  Plan: -Stool studies for GI Pathogen (includes C. Diff), fecal elastase and Calprotectin -Rpt colon 09/2024 -Stop zetia x 3 days, see if diarrhea improves -Imodium AD 1 tab with dinner -If still with problems, trial of cholestryamine followed by FS with Bx (if needed)    HPI:    Derrick [name] is a 66 y.o. male  Diarrhea x 2 months with some incontinence.  No nocturnal symptoms, mostly after eating, more prominently ever since Zetia has been changed.  Would have occasional solid stool at times.  No melena or hematochezia.  Denies having any upper GI symptoms including N/V, heartburn, regurgitation, odynophagia or dysphagia.  No recent weight loss.  No nonsteroidals.  No history of travel or being on antibiotics.  No fever chills or night sweats.  Wt Readings from Last 3 Encounters:  02/24/22 180 lb 8 oz (81.9 kg)  01/10/22 175 lb (79.4 kg)  10/28/21 174 lb (78.9 kg)      Past GI work-up:  Colonoscopy 09/2021 (d/t FH colon ca sis at age 72) - Eight 2 to 4 mm polyps in the mid sigmoid colon, in the mid descending colon, in the mid transverse colon and in the distal transverse colon, removed with a cold snare. Resected and retrieved. Bx-SSA, TA - Minimal colonic diverticulosis. - Non-bleeding internal hemorrhoids. - The examination was otherwise normal on direct and retroflexion views.  EGD 09/2021:  -Moderate gastritis. -Neg HP, Neg SB Bx for celiac. Past Medical History:  Diagnosis Date   Anxiety    Bipolar 1 disorder ([place])    Bipolar disorder ([place]) 10/05/2009   Qualifier: Diagnosis of  By: [name]     BPH (benign prostatic hypertrophy)    CAD (coronary artery disease), native coronary artery    a. DES x 2 to RCA in 2004 b. 02/11/17 PCI w/DES  x1 to mRCA   Coronary atherosclerosis 10/05/2009   Qualifier: Diagnosis of  By: [name]   ANGIOGRAPHIC DATA:  1. Left main coronary artery was free of critical disease.  2. The LAD coursed to the apex. There is mild luminal irregularity  including mild irregularity of the diagonal. No high grade areas of  stenosis were noted. There was faint collateralization of the distal  right coronary circulation.  3. The circumflex provided two major m   Depression    Dyspnea 09/20/2012   Essential and other specified forms of tremor 02/05/2013   HYPERCHOLESTEROLEMIA 10/05/2009   Qualifier: Diagnosis of  By: [name]     Hyperlipidemia    Memory loss 02/05/2013   Mitral regurgitation and aortic stenosis    MITRAL STENOSIS/ INSUFFICIENCY, NON-RHEUMATIC 01/06/2011   Qualifier: Diagnosis of  By: [name], MD, [name] Study Conclusions  - Left ventricle: The cavity size was normal. Wall thickness was increased in a pattern of mild LVH. Systolic function was normal. The estimated ejection fraction was in the range of 55% to 60%. Wall motion was normal; there were no regional wall motion abnormalities. Left ventricular diastolic function parameters were   MVP (mitral valve prolapse)    Myocardial infarction ([place]) 2012   Obstructive sleep apnea 02/05/2013   OSA (obstructive sleep apnea) 03/22/2013   Parasomnia 02/05/2013   Sleep  apnea    Sleep disorder with cognitive complaints 10/26/2018   Subdural hematoma, post-traumatic    fell and hit his head on ice and urgent care and pcp but did not tell them he had fallen and hit his head.   Unspecified hereditary and idiopathic peripheral neuropathy 02/05/2013    Past Surgical History:  Procedure Laterality Date   COLONOSCOPY     CORONARY STENT INTERVENTION N/A 02/12/2017   Procedure: Coronary Stent Intervention;  Surgeon: [name], MD;  Location: [facility];  Service: Cardiovascular;  Laterality: N/A;   CRANIOTOMY Left  01/28/2021   Procedure: CRANIOTOMY HEMATOMA EVACUATION SUBDURAL;  Surgeon: [name], MD;  Location: [facility];  Service: Neurosurgery;  Laterality: Left;   LEFT HEART CATH AND CORONARY ANGIOGRAPHY N/A 02/12/2017   Procedure: Left Heart Cath and Coronary Angiography;  Surgeon: [name], MD;  Location: [facility];  Service: Cardiovascular;  Laterality: N/A;   POLYPECTOMY      Family History  Problem Relation Age of Onset   Heart disease Mother    COPD Father    Colon polyps Sister    Colon cancer Sister    Heart disease Brother    Ovarian cancer Daughter    Esophageal cancer Neg Hx    Rectal cancer Neg Hx    Stomach cancer Neg Hx     Social History   Tobacco Use   Smoking status: Every Day    Packs/day: 0.10    Types: Cigarettes    Last attempt to quit: 02/05/2003    Years since quitting: 19.0   Smokeless tobacco: Never   Tobacco comments:    4 cigs per day updated 09/19/2021  Vaping Use   Vaping Use: Never used  Substance Use Topics   Alcohol use: Not Currently    Comment: quit at age 54   Drug use: Never    Current Outpatient Medications  Medication Sig Dispense Refill   aspirin EC 81 MG tablet Take 81 mg by mouth daily. Swallow whole.     atorvastatin (LIPITOR) 80 MG tablet Take 1 tablet (80 mg total) by mouth daily. 90 tablet 3   divalproex (DEPAKOTE) 500 MG DR tablet Take 4 tablets (2,000 mg total) by mouth at bedtime. 360 tablet 3   escitalopram (LEXAPRO) 20 MG tablet Take 1 tablet (20 mg total) by mouth daily. 90 tablet 3   ezetimibe (ZETIA) 10 MG tablet Take 1 tablet (10 mg total) by mouth daily. 90 tablet 1   isosorbide mononitrate (IMDUR) 30 MG 24 hr tablet Take 0.5 tablets (15 mg total) by mouth daily. 45 tablet 3   lamoTRIgine (LAMICTAL) 100 MG tablet 1.5 pills daily. 135 tablet 3   metoprolol tartrate (LOPRESSOR) 25 MG tablet Take 1 tablet (25 mg total) by mouth 2 (two) times daily. 180 tablet 3   montelukast (SINGULAIR) 10 MG tablet TAKE 1  TABLET (10 MG TOTAL) BY MOUTH DAILY. 90 tablet 0   nitroGLYCERIN (NITROSTAT) 0.4 MG SL tablet Place 1 tablet (0.4 mg total) under the tongue every 5 (five) minutes as needed for chest pain. 25 tablet 3   pantoprazole (PROTONIX) 40 MG tablet Take 1 tablet (40 mg total) by mouth daily. 90 tablet 3   tamsulosin (FLOMAX) 0.4 MG CAPS capsule TAKE 1 CAPSULE EVERY DAY 90 capsule 1   terbinafine (LAMISIL) 250 MG tablet Take 250 mg by mouth daily.     VITAMIN D, CHOLECALCIFEROL, PO Take 65 mcg by mouth daily.  No current facility-administered medications for this visit.    No Known Allergies  Review of Systems:  neg     Physical Exam:    BP 120/78    Pulse 75    Ht '5\' 11"'$  (1.803 m)    Wt 180 lb 8 oz (81.9 kg)    SpO2 96%    BMI 25.17 kg/m  Wt Readings from Last 3 Encounters:  02/24/22 180 lb 8 oz (81.9 kg)  01/10/22 175 lb (79.4 kg)  10/28/21 174 lb (78.9 kg)   Constitutional:  Well-developed, in no acute distress. Psychiatric: Normal mood and affect. Behavior is normal. HEENT: Pupils normal.  Conjunctivae are normal. No scleral icterus. Cardiovascular: Normal rate, regular rhythm. No edema Pulmonary/chest: Effort normal and breath sounds normal. No wheezing, rales or rhonchi. Abdominal: Soft, nondistended. Nontender. Bowel sounds active throughout. There are no masses palpable. No hepatomegaly. Rectal: He would like to hold off Neurological: Alert and oriented to person place and time. Skin: Skin is warm and dry. No rashes noted.  Data Reviewed: I have personally reviewed following labs and imaging studies  CBC: CBC Latest Ref Rng & Units 01/10/2022 09/20/2021 06/26/2021  WBC 3.4 - 10.8 x10E3/uL 5.8 7.0 7.7  Hemoglobin 13.0 - 17.7 g/dL 14.1 13.6 14.2  Hematocrit 37.5 - 51.0 % 39.1 39.4 40.9  Platelets 150 - 450 x10E3/uL 145(L) 156.0 146(L)    CMP: CMP Latest Ref Rng & Units 09/20/2021 06/26/2021 03/18/2021  Glucose 70 - 99 mg/dL 91 94 106(H)  BUN 6 - 23 mg/dL '11 8 10  '$ Creatinine  0.40 - 1.50 mg/dL 0.88 0.87 0.82  Sodium 135 - 145 mEq/L 139 141 140  Potassium 3.5 - 5.1 mEq/L 4.4 5.0 5.0  Chloride 96 - 112 mEq/L 103 103 102  CO2 19 - 32 mEq/L '30 24 22  '$ Calcium 8.4 - 10.5 mg/dL 9.0 9.5 9.1  Total Protein 6.0 - 8.3 g/dL 6.5 6.4 6.5  Total Bilirubin 0.2 - 1.2 mg/dL 0.6 0.4 0.3  Alkaline Phos 39 - 117 U/L 42 56 55  AST 0 - 37 U/L '11 20 11  '$ ALT 0 - 53 U/L '10 20 9      '$ Carmell Austria, MD 02/24/2022, 2:01 PM  Cc: [name], [name]

## 2022-02-24 NOTE — Patient Instructions (Signed)
If you are age 64 or older, your body mass index should be between 23-30. Your Body mass index is 25.17 kg/m?[name] If this is out of the aforementioned range listed, please consider follow up with your Primary Care Provider. ? ?If you are age 84 or younger, your body mass index should be between 19-25. Your Body mass index is 25.17 kg/m?[name] If this is out of the aformentioned range listed, please consider follow up with your Primary Care Provider.  ? ?________________________________________________________ ? ?The [facility] providers would like to encourage you to use [facility] to communicate with providers for non-urgent requests or questions.  Due to long hold times on the telephone, sending your provider a message by [facility] may be a faster and more efficient way to get a response.  Please allow 48 business hours for a response.  Please remember that this is for non-urgent requests.  ?_______________________________________________________ ? ?Please go to the lab on the 2nd floor [address] before you leave the office today. Specimen has to back to this location ? ?Stop Zetia for 3 days to see if symptoms improve. Call is in 1 week to let us know how you did with and without the medication. ? ?If diarrhea continues you can take Imodium AD 1 tablet at dinner as needed. ? ?Repeat colonoscopy 09-2024. We will send a letter out but you can call 2 months prior to schedule this. ? ?Please call with any questions or concerns. ? ?Thank you, ? ?Dr. Jackquline Denmark ? ?

## 2022-02-26 ENCOUNTER — Other Ambulatory Visit: Payer: Medicare HMO

## 2022-02-26 DIAGNOSIS — Z800 Family history of malignant neoplasm of digestive organs: Secondary | ICD-10-CM | POA: Diagnosis not present

## 2022-02-26 DIAGNOSIS — A09 Infectious gastroenteritis and colitis, unspecified: Secondary | ICD-10-CM | POA: Diagnosis not present

## 2022-02-26 DIAGNOSIS — R197 Diarrhea, unspecified: Secondary | ICD-10-CM | POA: Diagnosis not present

## 2022-02-26 DIAGNOSIS — Z86010 Personal history of colonic polyps: Secondary | ICD-10-CM

## 2022-02-26 LAB — VALPROIC ACID LEVEL: Valproic Acid Lvl: 72 ug/mL (ref 50–100)

## 2022-02-26 LAB — LAMOTRIGINE LEVEL: Lamotrigine Lvl: 7.8 ug/mL (ref 2.0–20.0)

## 2022-02-27 LAB — CLOSTRIDIUM DIFFICILE TOXIN B, QUALITATIVE, REAL-TIME PCR: Toxigenic C. Difficile by PCR: NOT DETECTED

## 2022-02-28 LAB — CALPROTECTIN, FECAL: Calprotectin, Fecal: 21 ug/g (ref 0–120)

## 2022-03-01 LAB — GI PROFILE, STOOL, PCR

## 2022-03-04 NOTE — Chronic Care Management (AMB) (Signed)
?  Care Management  ? ?Note ? ?03/04/2022 ?Name: Derrick [name] MRN: [id number] DOB: [birth year] ? ?Derrick [name] is a 66 y.o. year old male who is a primary care patient of Gabriel Earing, [name] and is actively engaged with the care management team. I reached out to Edrick Kins by phone today to assist with re-scheduling a follow up visit with the Licensed Clinical Social Worker ? ?Follow up plan: ?Telephone appointment with care management team member scheduled for:03/26/2022 ? ?Penne Lash, RMA ?Care Guide, Embedded Care Coordination ?[facility]  ?[place], [place] [address] ?Direct Dial: [phone] ?[email or web address] ?Website: [email or web address]  ? ?

## 2022-03-06 LAB — PANCREATIC ELASTASE, FECAL: Pancreatic Elastase-1, Stool: >500 mcg/g

## 2022-03-12 ENCOUNTER — Other Ambulatory Visit: Payer: Self-pay

## 2022-03-12 ENCOUNTER — Ambulatory Visit: Payer: Medicare HMO | Admitting: Psychiatry

## 2022-03-12 DIAGNOSIS — F1011 Alcohol abuse, in remission: Secondary | ICD-10-CM | POA: Diagnosis not present

## 2022-03-12 DIAGNOSIS — F319 Bipolar disorder, unspecified: Secondary | ICD-10-CM

## 2022-03-12 DIAGNOSIS — F068 Other specified mental disorders due to known physiological condition: Secondary | ICD-10-CM

## 2022-03-12 DIAGNOSIS — F1211 Cannabis abuse, in remission: Secondary | ICD-10-CM | POA: Diagnosis not present

## 2022-03-12 DIAGNOSIS — F411 Generalized anxiety disorder: Secondary | ICD-10-CM

## 2022-03-12 DIAGNOSIS — F902 Attention-deficit hyperactivity disorder, combined type: Secondary | ICD-10-CM
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
## 2022-03-19 ENCOUNTER — Encounter: Payer: Self-pay | Admitting: Physician Assistant

## 2022-03-19 ENCOUNTER — Ambulatory Visit: Payer: Medicare HMO | Admitting: Physician Assistant

## 2022-03-19 VITALS — BP 118/84 | HR 74 | Ht 71.0 in | Wt 180.2 lb

## 2022-03-19 DIAGNOSIS — Z86010 Personal history of colonic polyps: Secondary | ICD-10-CM

## 2022-03-19 DIAGNOSIS — R197 Diarrhea, unspecified: Secondary | ICD-10-CM

## 2022-03-19 NOTE — Progress Notes (Signed)
? ?Subjective:  ? ? Patient ID: Derrick [name], male    DOB: [birth year], 66 y.o.   MRN: [id number] ? ?HPI ?Derrick [name] is a 66 year old white male, established with Dr. Lyndel Safe who comes in today for follow-up of diarrhea. ?Patient has history of hypertension, mitral valve stenosis and prolapse, congestive heart failure, sleep apnea, prior history of subdural hematoma status postcraniotomy, bipolar disorder and anxiety. ?He has family history of colon cancer in his sister and personal history of sessile serrated and adenomatous colon polyps.  Last colonoscopy October 2022 and indicated for 3-year interval follow-up. ?He had been seen by Dr. Lyndel Safe on 02/24/2022 with complaints of diarrhea with occasional incontinence over the prior 2 months.  No associated abdominal pain or cramping, no nocturnal symptoms and symptoms generally occurring after meals.  Patient felt that symptoms were more prominent since being on Zetia.  He was having occasional formed stools.  No melena or hematochezia.  No weight loss. ?He underwent work-up with stool studies, including fecal calprotectin which was negative, fecal elastase normal, C. difficile negative and GI path panel negative. ?He was asked to use Imodium on a as needed basis and hold the Zetia. ? ?Today he says he has stayed off of Zetia but had not noticed any significant change in symptoms.  At this time he is not having diarrhea every day but having intermittent episodes of urgency diarrhea and sometimes incontinence which may occur about every third day.  In between these episodes he will have seminormal stools or formed to semiformed stools once per day.  When he has the episodes of urgency and diarrhea he may have a few bowel movements within a short period of time. ?He is not aware of any specific triggers though says he has had issues with ice cream in the past causing some loose stools and gas. ?He says he eats a lot of starburst candy on a daily basis has been doing that  long-term, ?Not aware of any other particular food intolerances, denies any artificial sweeteners. ?He is on Depakote and Lamictal. ? ?Review of Systems  Pertinent positive and negative review of systems were noted in the above HPI section.  All other review of systems was otherwise negative.  ? ?Outpatient Encounter Medications as of 03/19/2022  ?Medication Sig  ? aspirin EC 81 MG tablet Take 81 mg by mouth daily. Swallow whole.  ? atorvastatin (LIPITOR) 80 MG tablet Take 1 tablet (80 mg total) by mouth daily.  ? divalproex (DEPAKOTE) 500 MG DR tablet Take 4 tablets (2,000 mg total) by mouth at bedtime.  ? escitalopram (LEXAPRO) 20 MG tablet Take 1 tablet (20 mg total) by mouth daily.  ? ezetimibe (ZETIA) 10 MG tablet Take 1 tablet (10 mg total) by mouth daily.  ? ferrous sulfate 324 MG TBEC Take 324 mg by mouth daily with breakfast. Takes 1 tablet per day for anemia  ? isosorbide mononitrate (IMDUR) 30 MG 24 hr tablet Take 0.5 tablets (15 mg total) by mouth daily.  ? lamoTRIgine (LAMICTAL) 100 MG tablet 1.5 pills daily.  ? metoprolol tartrate (LOPRESSOR) 25 MG tablet Take 1 tablet (25 mg total) by mouth 2 (two) times daily.  ? montelukast (SINGULAIR) 10 MG tablet TAKE 1 TABLET (10 MG TOTAL) BY MOUTH DAILY.  ? nitroGLYCERIN (NITROSTAT) 0.4 MG SL tablet Place 1 tablet (0.4 mg total) under the tongue every 5 (five) minutes as needed for chest pain.  ? pantoprazole (PROTONIX) 40 MG tablet Take 1 tablet (40 mg total) by  mouth daily.  ? tamsulosin (FLOMAX) 0.4 MG CAPS capsule TAKE 1 CAPSULE EVERY DAY  ? terbinafine (LAMISIL) 250 MG tablet Take 250 mg by mouth daily.  ? [DISCONTINUED] VITAMIN D, CHOLECALCIFEROL, PO Take 65 mcg by mouth daily. (Patient not taking: Reported on 03/19/2022)  ? ?No facility-administered encounter medications on file as of 03/19/2022.  ? ?No Known Allergies ?Patient Active Problem List  ? Diagnosis Date Noted  ? Subdural hematoma ([place]) 10/28/2021  ? Primary hypertension 06/26/2021  ? Mitral valve  prolapse 06/26/2021  ? History of subdural hematoma 06/26/2021  ? Bipolar disorder in partial remission ([place]) 06/26/2021  ? Chronic systolic heart failure ([place]) 06/26/2021  ? Status post craniotomy 01/28/2021  ? S/P craniotomy 01/28/2021  ? Insomnia 11/29/2018  ? Sleep disorder with cognitive complaints 10/26/2018  ? OSA (obstructive sleep apnea) 03/22/2013  ? Memory loss 02/05/2013  ? Low back pain 02/05/2013  ? Unspecified hereditary and idiopathic peripheral neuropathy 02/05/2013  ? Essential and other specified forms of tremor 02/05/2013  ? Parasomnia 02/05/2013  ? Obstructive sleep apnea 02/05/2013  ? Dyspnea 09/20/2012  ? MITRAL STENOSIS/ INSUFFICIENCY, NON-RHEUMATIC 01/06/2011  ? Hypercholesteremia 10/05/2009  ? Bipolar disorder ([place]) 10/05/2009  ? ANXIETY DISORDER 10/05/2009  ? TOBACCO ABUSE 10/05/2009  ? Atherosclerotic heart disease of native coronary artery with other forms of angina pectoris ([place])   ? ?Social History  ? ?Socioeconomic History  ? Marital status: Married  ?  Spouse name: [name]  ? Number of children: 2  ? Years of education: Not on file  ? Highest education level: Not on file  ?Occupational History  ? Not on file  ?Tobacco Use  ? Smoking status: Every Day  ?  Packs/day: 0.10  ?  Types: Cigarettes  ?  Last attempt to quit: 02/05/2003  ?  Years since quitting: 19.1  ? Smokeless tobacco: Never  ? Tobacco comments:  ?  4 cigs per day updated 09/19/2021  ?Vaping Use  ? Vaping Use: Never used  ?Substance and Sexual Activity  ? Alcohol use: Not Currently  ?  Comment: quit at age 48  ? Drug use: Never  ? Sexual activity: Not on file  ?Other Topics Concern  ? Not on file  ?Social History Narrative  ? Oldest daughter died in 03/23/2015 from ovarian ca.  ? 5 grandchildren  ? ?Social Determinants of Health  ? ?Financial Resource Strain: Low Risk   ? Difficulty of Paying Living Expenses: Not hard at all  ?Food Insecurity: No Food Insecurity  ? Worried About Charity fundraiser in the Last Year: Never true  ?  Ran Out of Food in the Last Year: Never true  ?Transportation Needs: No Transportation Needs  ? Lack of Transportation (Medical): No  ? Lack of Transportation (Non-Medical): No  ?Physical Activity: Sufficiently Active  ? Days of Exercise per Week: 5 days  ? Minutes of Exercise per Session: 30 min  ?Stress: Stress Concern Present  ? Feeling of Stress : Rather much  ?Social Connections: Socially Integrated  ? Frequency of Communication with Friends and Family: More than three times a week  ? Frequency of Social Gatherings with Friends and Family: More than three times a week  ? Attends Religious Services: 1 to 4 times per year  ? Active Member of Clubs or Organizations: Yes  ? Attends Archivist Meetings: 1 to 4 times per year  ? Marital Status: Married  ?Intimate Partner Violence: Not At Risk  ? Fear of Current  or Ex-Partner: No  ? Emotionally Abused: No  ? Physically Abused: No  ? Sexually Abused: No  ? ? ?Mr. Rosenberger's family history includes COPD in his father; Colon cancer in his sister; Colon polyps in his sister; Heart disease in his brother and mother; Ovarian cancer in his daughter. ? ? ?   ?Objective:  ?  ?Vitals:  ? 03/19/22 1111  ?BP: 118/84  ?Pulse: 74  ?SpO2: 96%  ? ? ?Physical Exam Well-developed well-nourished white male in no acute distress.   Weight, 180 BMI 25 ? ?HEENT; nontraumatic normocephalic, EOMI, PE R LA, sclera anicteric. ?Oropharynx; not examined today ?Neck; supple, no JVD ?Cardiovascular; regular rate and rhythm with S1-S2, no murmur rub or gallop ?Pulmonary; Clear bilaterally ?Abdomen; soft, nontender, nondistended, no palpable mass or hepatosplenomegaly, bowel sounds are active ?Rectal; not done today ?Skin; benign exam, no jaundice rash or appreciable lesions ?Extremities; no clubbing cyanosis or edema skin warm and dry ?Neuro/Psych; alert and oriented x4, grossly nonfocal mood and affect appropriate  ? ? ? ?   ?Assessment & Plan:  ? ?#7 66 year old white male with 3 to  61-monthhistory of intermittent urgency and diarrhea.  Currently having an episode about every third day sometimes associated with incontinence, normal to soft bowel movements in between.  No associated abdominal pain

## 2022-03-19 NOTE — Progress Notes (Signed)
? ? ?Office Visit  ?  ?Patient Name: Derrick [name] ?Date of Encounter: 03/20/2022 ? ?Primary Care Provider:  [name], [name] ?Primary Cardiologist:  Derrick [name], Derrick Nestle, NP ?Primary Electrophysiologist: None ?Chief Complaint  ?  ?86-monthfollow-up for coronary artery disease ? ? Patient Profile: ?CAD s/p inferior STEMI with DES x 2 to RCA in 2004  ?Ischemic cardiomyopathy ?BP with mild to moderate MR ?Tobacco abuse ?Bipolar disorder ?OSA ?HLD ? ? Recent Studies: ?10/2014 TTE: EF of 45 to 50%, moderate LVH, MVP with mild to moderate MR ?08/2017 nuclear stress test: EF 37% small defect of moderate severity present in the basal inferior and mid inferior location. ?2018 cardiac MRI: Normal LV size with wall motion      abnormalities noted above. EF 47%. Delayed enhancement images suggestive of prior infarction in the RCA territory. ?07/2021 TTE: EF 55-60%, normal LV function, no RWMA, mild LVH, normal RV systolic function, mildly thickened ? ? ?History of Present Illness  ?  ?Derrick CROPPis a 66y.o. male with PMH of CAD s/p inferior STEMI with DES x2 to RCA in 2004, MR with mitral valve prolapse,ICM, OSA, HLD, tobacco abuse, bipolar disorder.  He was first seen by Dr. MAlgernon Huxleyin 2014, for stable shortness of breath.  Myoview was ordered and noted to have abnormalities, 2018 echo updated with EF decreased and repeat Myoview revealing larger defect in inferior and mid inferior location.  Cardiac cath was recommended and PCI completed ? ?He presents today for 622-monthollow-up.  He was last seen by Dr. PeJohney [name] 06/2021, at that time he was doing well overall, blood pressure was well controlled however was still smoking 4 cigarettes/day and trying to quit. ? ?Since last being seen in our clinic  Mr. TuPavoneeports doing well and states that his diarrhea has subsided since stopping the Zetia.  He advised that he would like to stay off for now and have his cholesterol rechecked to see where his numbers are.  He  is walking daily with his wife and dogs and is controlling his carbs and fried food intake.  He is however still smoking 4 cigarettes a day and was advised that if he would like to quit we would provide him with resources.  We discussed his mitral regurgitation and the impact of smoking on preventing progression of valve disease. He denies chest pain, palpitations, dyspnea, PND, orthopnea, nausea, vomiting, dizziness, syncope, edema, weight gain, or early satiety.  Patient's blood pressure is well controlled today at 116/71 and states that he does not check at home however he is abstaining from salt in his diet. ? ?Past Medical History  ?  ?Past Medical History:  ?Diagnosis Date  ? Anxiety   ? Bipolar 1 disorder ([facility]  ? B[place] disorder ([place]/22/2010  ? Qualifier: Diagnosis of  By: JaSidney Ace  ? BPH (benign prostatic hypertrophy)   ? CAD (coronary artery disease), native coronary artery   ? a. DES x 2 to RCA in 2004 b. 02/11/17 PCI w/DES x1 to mRCA  ? Coronary atherosclerosis 10/05/2009  ? Qualifier: Diagnosis of  By: JaSidney Ace ANGIOGRAPHIC DATA:  1. Left main coronary artery was free of critical disease.  2. The LAD coursed to the apex. There is mild luminal irregularity  including mild irregularity of the diagonal. No high grade areas of  stenosis were noted. There was faint collateralization of the distal  right coronary circulation.  3. The  circumflex provided two major m  ? Depression   ? Dyspnea 09/20/2012  ? Essential and other specified forms of tremor 02/05/2013  ? HYPERCHOLESTEROLEMIA 10/05/2009  ? Qualifier: Diagnosis of  By: [name]    ? Hyperlipidemia   ? Memory loss 02/05/2013  ? Mitral regurgitation and aortic stenosis   ? MITRAL STENOSIS/ INSUFFICIENCY, NON-RHEUMATIC 01/06/2011  ? Qualifier: Diagnosis of  By: [name], MD, [name] Study Conclusions  - Left ventricle: The cavity size was normal. Wall thickness was increased in a pattern of mild LVH. Systolic function  was normal. The estimated ejection fraction was in the range of 55% to 60%. Wall motion was normal; there were no regional wall motion abnormalities. Left ventricular diastolic function parameters were  ? MVP (mitral valve prolapse)   ? Myocardial infarction [facility]) 2012  ? Obstructive sleep apnea 02/05/2013  ? OSA (obstructive sleep apnea) 03/22/2013  ? Parasomnia 02/05/2013  ? Sleep apnea   ? Sleep disorder with cognitive complaints 10/26/2018  ? Subdural hematoma, post-traumatic ([name])   ? fell and hit his head on ice and urgent care and pcp but did not tell them he had fallen and hit his head.  ? Unspecified hereditary and idiopathic peripheral neuropathy 02/05/2013  ? ?Past Surgical History:  ?Procedure Laterality Date  ? COLONOSCOPY    ? CORONARY STENT INTERVENTION N/A 02/12/2017  ? Procedure: Coronary Stent Intervention;  Surgeon: [name], MD;  Location: [place];  Service: Cardiovascular;  Laterality: N/A;  ? CRANIOTOMY Left 01/28/2021  ? Procedure: CRANIOTOMY HEMATOMA EVACUATION SUBDURAL;  Surgeon: [name], MD;  Location: [facility];  Service: Neurosurgery;  Laterality: Left;  ? LEFT HEART CATH AND CORONARY ANGIOGRAPHY N/A 02/12/2017  ? Procedure: Left Heart Cath and Coronary Angiography;  Surgeon: [name], MD;  Location: [place] [facility];  Service: Cardiovascular;  Laterality: N/A;  ? POLYPECTOMY    ? ? ?Allergies ? ?No Known Allergies ? ?Home Medications  ?  ?Current Outpatient Medications  ?Medication Sig Dispense Refill  ? aspirin EC 81 MG tablet Take 81 mg by mouth daily. Swallow whole.    ? atorvastatin (LIPITOR) 80 MG tablet Take 1 tablet (80 mg total) by mouth daily. 90 tablet 3  ? divalproex (DEPAKOTE) 500 MG DR tablet Take 4 tablets (2,000 mg total) by mouth at bedtime. 360 tablet 3  ? escitalopram (LEXAPRO) 20 MG tablet Take 1 tablet (20 mg total) by mouth daily. 90 tablet 3  ? ezetimibe (ZETIA) 10 MG tablet Take 1 tablet (10 mg total) by mouth daily. 90 tablet 1  ?  ferrous sulfate 324 MG TBEC Take 324 mg by mouth daily with breakfast. Takes 1 tablet per day for anemia    ? isosorbide mononitrate (IMDUR) 30 MG 24 hr tablet Take 0.5 tablets (15 mg total) by mouth daily. 45 tablet 3  ? lamoTRIgine (LAMICTAL) 100 MG tablet 1.5 pills daily. 135 tablet 3  ? metoprolol succinate (TOPROL-XL) 25 MG 24 hr tablet Take 1 tablet (25 mg total) by mouth daily. Take with or immediately following a meal. 90 tablet 3  ? montelukast (SINGULAIR) 10 MG tablet TAKE 1 TABLET (10 MG TOTAL) BY MOUTH DAILY. 90 tablet 0  ? nitroGLYCERIN (NITROSTAT) 0.4 MG SL tablet Place 1 tablet (0.4 mg total) under the tongue every 5 (five) minutes as needed for chest pain. 25 tablet 3  ? pantoprazole (PROTONIX) 40 MG tablet Take 1 tablet (40 mg total) by mouth daily. [place]  tablet 3  ? tamsulosin (FLOMAX) 0.4 MG CAPS capsule TAKE 1 CAPSULE EVERY DAY 90 capsule 1  ? terbinafine (LAMISIL) 250 MG tablet Take 250 mg by mouth daily.    ? ?No current facility-administered medications for this visit.  ?  ? ?Review of Systems  ?Please see the history of present illness.    ? ?All other systems reviewed and are otherwise negative except as noted above. ? ?Physical Exam  ?  ?Wt Readings from Last 3 Encounters:  ?03/20/22 181 lb (82.1 kg)  ?03/19/22 180 lb 3.2 oz (81.7 kg)  ?02/24/22 180 lb 8 oz (81.9 kg)  ? ?VS: ?Vitals:  ? 03/20/22 1331  ?BP: 116/71  ?Pulse: 67  ?SpO2: 96%  ?,Body mass index is 25.24 kg/m?. ? ?Constitutional:   ?   Appearance: Healthy appearance. Not in distress.  ?Neck:  ?   Vascular: JVD normal.  ?Pulmonary:  ?   Effort: Pulmonary effort is normal.  ?   Breath sounds: No wheezing. No rales.  ?Cardiovascular:  ?   Normal rate. Regular rhythm. Normal S1. Normal S2.   ?   Murmurs: There is no murmur.  ?Edema: ?   Peripheral edema absent.  ?Abdominal:  ?   Palpations: Abdomen is soft. There is no hepatomegaly.  ?Skin: ?   General: Skin is warm and dry.  ?Neurological:  ?   General: No focal deficit present.  ?    Mental Status: Alert and oriented to person, place and time.  ?   Cranial Nerves: Cranial nerves are intact.  ?EKG/LABS/Other Studies Reviewed  ?  ?ECG personally reviewed by me today -sinus rhythm with rat

## 2022-03-19 NOTE — Patient Instructions (Addendum)
If you are age 64 or younger, your body mass index should be between 19-25. Your Body mass index is 25.13 kg/m?[name] If this is out of the aformentioned range listed, please consider follow up with your Primary Care Provider.  ?________________________________________________________ ? ?The [facility] providers would like to encourage you to use [facility] to communicate with providers for non-urgent requests or questions.  Due to long hold times on the telephone, sending your provider a message by [facility] may be a faster and more efficient way to get a response.  Please allow 48 business hours for a response.  Please remember that this is for non-urgent requests.  ?_______________________________________________________ ? ?STAY OFF of your Zetia ? ?Take 1 Imodium every morning, you may take a second dose as needed for diarrhea ? ?Call the office back in 2-3 weeks and speak with Dr. Steve Rattler nurse if symptoms persist. ? ?Thank you for entrusting me with your care and choosing [facility]. ? ?Nicoletta Ba, [name] ?

## 2022-03-20 ENCOUNTER — Encounter (HOSPITAL_BASED_OUTPATIENT_CLINIC_OR_DEPARTMENT_OTHER): Payer: Self-pay | Admitting: Nurse Practitioner

## 2022-03-20 ENCOUNTER — Encounter (HOSPITAL_BASED_OUTPATIENT_CLINIC_OR_DEPARTMENT_OTHER): Payer: Self-pay | Admitting: Family

## 2022-03-20 ENCOUNTER — Ambulatory Visit (HOSPITAL_BASED_OUTPATIENT_CLINIC_OR_DEPARTMENT_OTHER): Payer: Medicare HMO | Admitting: Nurse Practitioner

## 2022-03-20 VITALS — BP 116/71 | HR 67 | Ht 71.0 in | Wt 181.0 lb

## 2022-03-20 DIAGNOSIS — I10 Essential (primary) hypertension: Secondary | ICD-10-CM | POA: Diagnosis not present

## 2022-03-20 DIAGNOSIS — E7800 Pure hypercholesterolemia, unspecified: Secondary | ICD-10-CM | POA: Diagnosis not present

## 2022-03-20 DIAGNOSIS — I2510 Atherosclerotic heart disease of native coronary artery without angina pectoris: Secondary | ICD-10-CM

## 2022-03-20 DIAGNOSIS — I341 Nonrheumatic mitral (valve) prolapse: Secondary | ICD-10-CM

## 2022-03-20 MED ORDER — METOPROLOL SUCCINATE ER 25 MG PO TB24: 25.0000 mg | ORAL_TABLET | Freq: Every day | ORAL | 3 refills | Status: DC

## 2022-03-20 NOTE — Patient Instructions (Signed)
Medication Instructions:  ?Your physician has recommended you make the following change in your medication:  ? ?Stop: Metoprolol Tartrate  ? ?Start: Metoprolol Succinate '25mg'$  daily  ? ?*If you need a refill on your cardiac medications before your next appointment, please call your pharmacy* ? ?Follow-Up: ?At [facility], you and your health needs are our priority.  As part of our continuing mission to provide you with exceptional heart care, we have created designated Provider Care Teams.  These Care Teams include your primary Cardiologist (physician) and Advanced Practice Providers (APPs -  Physician Assistants and Nurse Practitioners) who all work together to provide you with the care you need, when you need it. ? ?We recommend signing up for the patient portal called "MyChart".  Sign up information is provided on this After Visit Summary.  MyChart is used to connect with patients for Virtual Visits (Telemedicine).  Patients are able to view lab/test results, encounter notes, upcoming appointments, etc.  Non-urgent messages can be sent to your provider as well.   ?To learn more about what you can do with MyChart, go to [email or web address].   ? ?Your next appointment:   ?6 month(s) ? ?The format for your next appointment:   ?In Person ? ?Provider:  Ambrose Pancoast, NP at [facility]  ? ?

## 2022-03-26 ENCOUNTER — Telehealth: Payer: Medicare HMO

## 2022-03-26 NOTE — Progress Notes (Signed)
Agree with assessment/plan RG 

## 2022-04-03 ENCOUNTER — Ambulatory Visit: Payer: Medicare HMO | Admitting: Physician Assistant

## 2022-04-03 ENCOUNTER — Encounter: Payer: Self-pay | Admitting: Physician Assistant

## 2022-04-03 DIAGNOSIS — F319 Bipolar disorder, unspecified: Secondary | ICD-10-CM | POA: Diagnosis not present

## 2022-04-03 DIAGNOSIS — Z79899 Other long term (current) drug therapy: Secondary | ICD-10-CM | POA: Diagnosis not present

## 2022-04-03 DIAGNOSIS — F411 Generalized anxiety disorder: Secondary | ICD-10-CM

## 2022-04-03 DIAGNOSIS — R454 Irritability and anger: Secondary | ICD-10-CM

## 2022-04-03 MED ORDER — LAMOTRIGINE 100 MG PO TABS: 100.0000 mg | ORAL_TABLET | Freq: Two times a day (BID) | ORAL | 3 refills | Status: DC

## 2022-04-03 MED ORDER — DIVALPROEX SODIUM 500 MG PO DR TAB: 2500.0000 mg | DELAYED_RELEASE_TABLET | Freq: Every day | ORAL | 3 refills | Status: DC

## 2022-04-03 NOTE — Progress Notes (Signed)
[facility] ? ?Patient ID: Derrick [name],  ?MRN: [id number] ? ?PCP: [name], [name] ? ?Date of Evaluation: 04/03/2022 ?Time spent:30 minutes ? ?Chief Complaint:  ?Chief Complaint   ?Follow-up ?  ? ? ? ?HISTORY/CURRENT STATUS: ?HPI for routine med check. ? ?Miserable. Very irritable for about 2 months but much worse in 2-3 weeks. Easy to get angry, over little things. He got mad this morning when getting his car washed, and the guy wasn't doing things right. No homicidal thoughts, but sometimes he gets so mad he has to walk away. Doesn't want to be this way. Hasn't been sleeping well. Goes to bed late and gets up early.  Ruminates.  No increased spending, no grandiosity, no paranoia, and no hallucinations. ? ?Not able to enjoy much of anything. Denies decreased energy or motivation.  Appetite has not changed.  No extreme sadness, tearfulness, or feelings of hopelessness.  Denies any changes in concentration, making decisions or remembering things.  Denies suicidal or homicidal thoughts. ? ?Review of Systems  ?Constitutional: Negative.   ?HENT: Negative.    ?Eyes: Negative.   ?Respiratory: Negative.    ?Cardiovascular: Negative.   ?Gastrointestinal: Negative.   ?Genitourinary: Negative.   ?Musculoskeletal: Negative.   ?Skin: Negative.   ?Neurological: Negative.   ?Endo/Heme/Allergies: Negative.   ?Psychiatric/Behavioral:    ?     See HPI.  ? ?Denies dizziness, syncope, seizures, numbness, tingling, tremor, tics, unsteady gait, slurred speech, confusion. Denies muscle or joint pain, stiffness, or dystonia. ? ?Individual Medical History/ Review of Systems: Changes? :Yes     GI issues, taken off Zetia. Neg stool culture and C Diff.  ? ?Past medications for mental health diagnoses include: ?Zoloft, Prozac, Effexor XR, Lexapro, Depakote, Lamictal, Wellbutrin, Xanax, Ambien, Sonata, Klonopin ? ?Allergies: Patient has no known allergies. ? ?Current Medications:  ?Current Outpatient Medications:  ?   aspirin EC 81 MG tablet, Take 81 mg by mouth daily. Swallow whole., Disp: , Rfl:  ?  atorvastatin (LIPITOR) 80 MG tablet, Take 1 tablet (80 mg total) by mouth daily., Disp: 90 tablet, Rfl: 3 ?  escitalopram (LEXAPRO) 20 MG tablet, Take 1 tablet (20 mg total) by mouth daily., Disp: 90 tablet, Rfl: 3 ?  ferrous sulfate 324 MG TBEC, Take 324 mg by mouth daily with breakfast. Takes 1 tablet per day for anemia, Disp: , Rfl:  ?  isosorbide mononitrate (IMDUR) 30 MG 24 hr tablet, Take 0.5 tablets (15 mg total) by mouth daily., Disp: 45 tablet, Rfl: 3 ?  metoprolol succinate (TOPROL-XL) 25 MG 24 hr tablet, Take 1 tablet (25 mg total) by mouth daily. Take with or immediately following a meal., Disp: 90 tablet, Rfl: 3 ?  montelukast (SINGULAIR) 10 MG tablet, TAKE 1 TABLET (10 MG TOTAL) BY MOUTH DAILY., Disp: 90 tablet, Rfl: 0 ?  nitroGLYCERIN (NITROSTAT) 0.4 MG SL tablet, Place 1 tablet (0.4 mg total) under the tongue every 5 (five) minutes as needed for chest pain., Disp: 25 tablet, Rfl: 3 ?  pantoprazole (PROTONIX) 40 MG tablet, Take 1 tablet (40 mg total) by mouth daily., Disp: 90 tablet, Rfl: 3 ?  tamsulosin (FLOMAX) 0.4 MG CAPS capsule, TAKE 1 CAPSULE EVERY DAY, Disp: 90 capsule, Rfl: 1 ?  terbinafine (LAMISIL) 250 MG tablet, Take 250 mg by mouth daily., Disp: , Rfl:  ?  divalproex (DEPAKOTE) 500 MG DR tablet, Take 5 tablets (2,500 mg total) by mouth at bedtime., Disp: 360 tablet, Rfl: 3 ?  ezetimibe (ZETIA) 10 MG tablet,  Take 1 tablet (10 mg total) by mouth daily. (Patient not taking: Reported on 04/03/2022), Disp: 90 tablet, Rfl: 1 ?  lamoTRIgine (LAMICTAL) 100 MG tablet, Take 1 tablet (100 mg total) by mouth 2 (two) times daily. 1.5 pills daily., Disp: 135 tablet, Rfl: 3 ?Medication Side Effects: none ? ?Family Medical/ Social History: Changes? no ? ?MENTAL HEALTH EXAM: ? ?There were no vitals taken for this visit.There is no height or weight on file to calculate BMI.  ?General Appearance: Casual and Well Groomed  ?Eye  Contact:  Good  ?Speech:  Clear and Coherent and Normal Rate  ?Volume:  Normal  ?Mood:  Anxious  ?Affect:  Labile and Anxious  ?Thought Process:  Goal Directed and Descriptions of Associations: Intact  ?Orientation:  Full (Time, Place, and Person)  ?Thought Content: Logical   ?Suicidal Thoughts:  No  ?Homicidal Thoughts:  No  ?Memory:  WNL  ?Judgement:  Good  ?Insight:  Good  ?Psychomotor Activity:  Restlessness and shakes his legs    ?Concentration:  Concentration: Good  ?Recall:  Good  ?Fund of Knowledge: Good  ?Language: Good  ?Assets:  Desire for Improvement  ?ADL's:  Intact  ?Cognition: WNL  ?Prognosis:  Good  ? ?Labs  ?02/24/2022 ?VPA 72 ?Lamictal 7.8 ? ? ?DIAGNOSES:  ?  ICD-10-CM   ?1. Bipolar I disorder ([name])  F31.9 Valproic acid level  ?  ?2. Irritability  R45.4 Valproic acid level  ?  ?3. Generalized anxiety disorder  F41.1   ?  ?4. Encounter for long-term (current) use of medications  Z79.899 Valproic acid level  ?  ? ? ?Receiving Psychotherapy: Yes with Dr. Jonni Sanger Mitchum ? ? ?RECOMMENDATIONS:  ?PDMP reviewed. No controlled substances since 01/30/2021. ?I provided 30 minutes of face to face time during this encounter, including time spent before and after the visit in records review, medical decision making, counseling pertinent to today's visit, and charting.  ?Rec increasing both Lam and VPA.  Has Depakote level drawn in 1 week.  He understands these changes and repeats them back to me. ? ?Increase Depakote DR 500 mg, 5 p.o. nightly. ?Increase Lamictal 100 mg, 1 po bid. ?Continue Lexapro 20 mg p.o. daily. ?Continue therapy with Dr. [name]. ?Lab ordered as above. ?Return in 4 weeks.  ? ?Donnal Moat, [name]  ?

## 2022-04-14 ENCOUNTER — Ambulatory Visit: Payer: Medicare HMO | Admitting: Physician Assistant

## 2022-04-15 ENCOUNTER — Telehealth: Payer: Medicare HMO

## 2022-04-28 ENCOUNTER — Other Ambulatory Visit: Payer: Medicare HMO

## 2022-04-28 DIAGNOSIS — F319 Bipolar disorder, unspecified: Secondary | ICD-10-CM | POA: Diagnosis not present

## 2022-04-28 DIAGNOSIS — Z79899 Other long term (current) drug therapy: Secondary | ICD-10-CM | POA: Diagnosis not present

## 2022-04-28 DIAGNOSIS — R454 Irritability and anger: Secondary | ICD-10-CM | POA: Diagnosis not present

## 2022-04-30 ENCOUNTER — Ambulatory Visit: Payer: Medicare HMO | Admitting: Psychiatry

## 2022-04-30 DIAGNOSIS — Z630 Problems in relationship with spouse or partner: Secondary | ICD-10-CM | POA: Diagnosis not present

## 2022-04-30 DIAGNOSIS — F068 Other specified mental disorders due to known physiological condition: Secondary | ICD-10-CM

## 2022-04-30 DIAGNOSIS — F1991 Other psychoactive substance use, unspecified, in remission: Secondary | ICD-10-CM | POA: Diagnosis not present

## 2022-04-30 DIAGNOSIS — F319 Bipolar disorder, unspecified: Secondary | ICD-10-CM | POA: Diagnosis not present

## 2022-04-30 DIAGNOSIS — Z9889 Other specified postprocedural states: Secondary | ICD-10-CM | POA: Diagnosis not present

## 2022-04-30 DIAGNOSIS — F411 Generalized anxiety disorder: Secondary | ICD-10-CM | POA: Diagnosis not present

## 2022-04-30 DIAGNOSIS — F3132 Bipolar disorder, current episode depressed, moderate: Secondary | ICD-10-CM | POA: Diagnosis not present

## 2022-04-30 LAB — VALPROIC ACID LEVEL: Valproic Acid Lvl: 91 ug/mL (ref 50–100)

## 2022-04-30 NOTE — Progress Notes (Signed)
Psychotherapy Progress Note ?[facility] ?Derrick Moore, PhD LP ? ?Patient ID: [name])    MRN: [id number] ?Therapy format: Individual psychotherapy ?Date: 04/30/2022      Start: 11:13a     Stop: 12:01p     Time Spent: 48 min + 15 min consultation with psychiatry and record review ?Location: In-person  ? ?Session narrative (presenting needs, interim history, self-report of stressors and symptoms, applications of prior therapy, status changes, and interventions made in session) ?Says psychiatry increased some medication, understood in response to being more depressed, but he just feels more depressed.  Legs bouncing now, acknowledges that what he feels right now is agitation and anxiety, but his lifestyle is degraded.  Feels more like sleeping all day and being up all night, more antsy spending time by himself but relieved to be up alone at night.  On edge when Derrick [name] comes home, says she'll bark at him to get up and do things, basically expects to be scolded, though he refers to her as "complacent".   ? ?Took up relationship dynamics, explored what Derrick actual expectations are on him at home -- mow, yard, sweep, dust, maybe some, might be asked to go to Derrick store or do something more to take care of Derrick dog -- but overall he is apt to just not feel like it for most anything lately, nap away a lot of Derrick day,  ?Sometimes can be honest and tell Derrick [name] he just feels bad and does not feel like it, Derrick things she wants him to do right now, but most of Derrick time finds himself trying to pretend like Derrick day was productive to keep from getting scolded, which may or may not be realistic, but certainly has to play a role in his chronic discontentment.  Suggested that if we can get some time to consult on how they interact over these things, we could help him by helping settle expectations and helping Derrick [name] change her tone, if needed, in addressing him.   ? ?Addressed negative self talk.  Vividly  describes getting yelled at inside his head to do better, get things done, as he has articulated before thoughts against himself to not be stupid, not bother people.  In times past, we have gotten after these essentially self bullying thoughts as provoking him to get overly angry with others, for example playing a role in hostile fantasies of wanting to kill a salesman he felt scammed him years ago.  Introduced Derrick idea of a bully inside, gave it a name "Derrick [name]", and role-played advocating for Derrick [name] with Derrick yeller, essentially asking Derrick yeller to rethink his methods while honoring Derrick fact that he has some good he is trying to pull off, i.e., seeing Derrick [name] be more responsible somehow.  Agreed Derrick [name] speaking accurately for him, and making use of his face, challenged Derrick yeller to make his own approach to Derrick [name] more like Jesus' approach to his disciples.  In closing, Derrick [name] notes being unable to pray, and basically feeling like his ability to pray as drowned out by Derrick internal yelling.  In keeping with his face, prayed together, asking God to call through Derrick noise of his thoughts and feelings and to help him know better when Derrick yeller or anyone else are overdoing it haranguing him about his behavior, and to help him tell Derrick difference between whether he has to do all or none and when he can do a little something useful. ? ?Therapeutic modalities:  Cognitive Behavioral Therapy, Solution-Oriented/Positive Psychology, Ego-Supportive, Psycho-education/Bibliotherapy, and Faith-sensitive ? ?Mental Status/Observations: ? ?Appearance:   Casual     ?Behavior:  Appropriate  ?Motor:  Restlestness  ?Speech/Language:   Fluent, sometimes impressionistic  ?Affect:  Appropriate  ?Mood:  anxious and depressed  ?Thought process:  normal  ?Thought content:    Rumination  ?Sensory/Perceptual disturbances:    WNL  ?Orientation:  Fully oriented  ?Attention:  Good  ?  ?Concentration:  Good  ?Memory:  grossly intact  ?Insight:    Fair   ?Judgment:   Fair  ?Impulse Control:  Fair  ? ?Risk Assessment: ?Danger to Self: No Self-injurious Behavior: No ?Danger to Others: No Physical Aggression / Violence: No ?Duty to Warn: No Access to Firearms a concern:  has, but abiding pledge not to use for settling issues ? ?Assessment of progress:  situational setback(s) ? ?Diagnosis: ?  ICD-10-CM   ?1. Bipolar 1 disorder, depressed, moderate ([name])  F31.32   ?  ?2. Generalized anxiety disorder  F41.1   ?  ?3. Cognitive dysfunction in bipolar disorder ([place])  F06.8   ? F31.9   ?  ?4. Marital stress  Z63.0   ?  ?5. History of multiple substance abuse disorders  F19.91   ?  ?6. hx craniotomy for subdural hematoma  Z98.890   ?  ? ?Plan:  ?Self-affirm that this is depression, not "laziness" he is dealing with, and that a combination of medication side effects, self harassment, loneliness, and upset circadian rhythm are all legitimately at work agitating and depressing him right now.  Each of those has appropriate things we can do, but none of it is "being a failure". ?Self-affirm that it is legitimate to feel bad and not want to do things he feels he should any given day, and it is a hardship to work through, not shame to be carried or hidden. ?Recommend to psychiatry to consider Wellbutrin, to address flagging motivation and agitation for feeling understimulated and underfocused.  Consulted after session, agreed in principle, need to check medical ramifications including history of subdural hematoma and possible seizure potential.  Meanwhile, understand med check tomorrow and likely decrease Depakote back to former level.  Also briefly discussed possible role for dextromethorphan, and psychosocial and lifestyle factors aggravating depression at this time, as well as patient's distorted understanding of ground rules what to discuss with psychiatry and therapy. ?Derrick [name] to a session, as soon as can be reasonably arranged, with stated purpose of learning more about  his behavior at home.  It has been at least 3 years now since collateral information gathering, and it is an enduring stress in Andree's living to be - or at least feel - hounded to be more responsible.  If Derrick tone can be changed in their interactions, it will help him change Derrick tone in his own thoughts and cut down Derrick emotional isolation he feels. ?Self-affirm Derrick right to differ with "Derrick yeller" in his head and, spiritually, self-affirm praying for God's help to hear God's balanced voice in his struggles, not just Derrick hell fire, dictatorial voice he experiences as crowding out God. ?Other recommendations/advice as may be noted above ?Continue to utilize previously learned skills ad lib ?Maintain medication as prescribed and work faithfully with relevant prescriber(s) if any changes are desired or seem indicated ?Call Derrick clinic on-call service, 988/hotline, 911, or present to [facility] or ER if any life-threatening psychiatric crisis ?Return in about 4 weeks (around 05/28/2022) for available earlier @ PT's need. ?  Already scheduled visit in this office 05/01/2022. ? ?[name], PhD ?Derrick Moore, PhD LP ?Clinical Psychologist, [facility] ?[facility] ?[address], [address] ?[place], [place] [address] ?(o) [phone] ?

## 2022-05-01 ENCOUNTER — Other Ambulatory Visit: Payer: Self-pay | Admitting: *Deleted

## 2022-05-01 ENCOUNTER — Ambulatory Visit: Payer: Medicare HMO | Admitting: Physician Assistant

## 2022-05-01 ENCOUNTER — Encounter: Payer: Self-pay | Admitting: Family Medicine

## 2022-05-01 ENCOUNTER — Encounter: Payer: Self-pay | Admitting: Physician Assistant

## 2022-05-01 DIAGNOSIS — F1991 Other psychoactive substance use, unspecified, in remission: Secondary | ICD-10-CM | POA: Diagnosis not present

## 2022-05-01 DIAGNOSIS — R251 Tremor, unspecified: Secondary | ICD-10-CM | POA: Diagnosis not present

## 2022-05-01 DIAGNOSIS — F3132 Bipolar disorder, current episode depressed, moderate: Secondary | ICD-10-CM

## 2022-05-01 DIAGNOSIS — Z9889 Other specified postprocedural states: Secondary | ICD-10-CM | POA: Diagnosis not present

## 2022-05-01 DIAGNOSIS — F418 Other specified anxiety disorders: Secondary | ICD-10-CM | POA: Diagnosis not present

## 2022-05-01 MED ORDER — ISOSORBIDE MONONITRATE ER 30 MG PO TB24: 15.0000 mg | ORAL_TABLET | Freq: Every day | ORAL | 1 refills | Status: DC

## 2022-05-01 MED ORDER — LAMOTRIGINE 100 MG PO TABS: 100.0000 mg | ORAL_TABLET | Freq: Two times a day (BID) | ORAL | 1 refills | Status: DC

## 2022-05-01 MED ORDER — BUPROPION HCL ER (SR) 100 MG PO TB12: 100.0000 mg | ORAL_TABLET | Freq: Every morning | ORAL | 1 refills | Status: DC

## 2022-05-01 MED ORDER — DIVALPROEX SODIUM 500 MG PO DR TAB: 2000.0000 mg | DELAYED_RELEASE_TABLET | Freq: Every day | ORAL | 1 refills | Status: DC

## 2022-05-01 NOTE — Telephone Encounter (Signed)
Derrick [name] NTBS 6 mos FU was to be in Jan. Mail order not sent

## 2022-05-01 NOTE — Patient Instructions (Addendum)
Decrease Depakote DR 500 mg, to 4 pills.

## 2022-05-01 NOTE — Telephone Encounter (Signed)
CALLED TO SCHEDULE APPT LMTCB LETTER MAILED

## 2022-05-01 NOTE — Progress Notes (Signed)
[facility]  Patient ID: Derrick [name],  MRN: [id number]  PCP: [name], [name]  Date of Evaluation: 05/01/2022  Time spent:40 minutes  Chief Complaint:  Chief Complaint   Depression; Anxiety; Follow-up     HISTORY/CURRENT STATUS: HPI for routine med check.  More depressed, doesn't want to do anything. Dozes all the time. Feels worthless, "I feel rotten. What's the use of living. I ain't going to kill myself or anything like that, but I just feel like what's the point. I wouldn't want to be around somebody like me." Tired all the time. Doesn't take a shower but every few days unless he does some work outside or something. Also doesn't care if he brushes his teeth sometimes. Appetite and weight are ok. Doesn't cry easily. No SI/HI.  A month ago Depakote and Lamictal were both increased due to the anger and irritability plus more depression.  The anger and irritability have improved. No increased energy with decreased need for sleep, no increased talkativeness, no racing thoughts but he does ruminate all the time, no impulsivity or risky behaviors, no increased spending, no grandiosity, no paranoia, and no hallucinations.  Has chronic anxiety but no worse, maybe the Lamictal has helped that a little bit.  No panic attacks.  Does not seem to be as agitated.  Since increasing the Depakote last month he has noticed a tremor in his hands bilaterally.  He has had that off and on over the years but never this bad.  It does not affect his eating or any other day to day abilities, but it is aggravating. He denies dizziness, syncope, seizures, numbness, tingling, tics, unsteady gait, slurred speech, confusion. Denies muscle or joint pain, stiffness, or dystonia. Denies unexplained weight loss, frequent infections, or sores that heal slowly.  No polyphagia, polydipsia, or polyuria. Denies visual changes or paresthesias.   Individual Medical History/ Review of Systems: Changes? :No        Past medications for mental health diagnoses include: Zoloft, Prozac, Effexor XR, Lexapro, Depakote, Lamictal, Wellbutrin, Xanax, Ambien, Sonata, Klonopin  Allergies: Patient has no known allergies.  Current Medications:  Current Outpatient Medications:    aspirin EC 81 MG tablet, Take 81 mg by mouth daily. Swallow whole., Disp: , Rfl:    atorvastatin (LIPITOR) 80 MG tablet, Take 1 tablet (80 mg total) by mouth daily., Disp: 90 tablet, Rfl: 3   buPROPion ER (WELLBUTRIN SR) 100 MG 12 hr tablet, Take 1 tablet (100 mg total) by mouth in the morning., Disp: 30 tablet, Rfl: 1   escitalopram (LEXAPRO) 20 MG tablet, Take 1 tablet (20 mg total) by mouth daily., Disp: 90 tablet, Rfl: 3   ferrous sulfate 324 MG TBEC, Take 324 mg by mouth daily with breakfast. Takes 1 tablet per day for anemia, Disp: , Rfl:    isosorbide mononitrate (IMDUR) 30 MG 24 hr tablet, Take 0.5 tablets (15 mg total) by mouth daily., Disp: 45 tablet, Rfl: 1   metoprolol succinate (TOPROL-XL) 25 MG 24 hr tablet, Take 1 tablet (25 mg total) by mouth daily. Take with or immediately following a meal., Disp: 90 tablet, Rfl: 3   montelukast (SINGULAIR) 10 MG tablet, TAKE 1 TABLET (10 MG TOTAL) BY MOUTH DAILY., Disp: 90 tablet, Rfl: 0   nitroGLYCERIN (NITROSTAT) 0.4 MG SL tablet, Place 1 tablet (0.4 mg total) under the tongue every 5 (five) minutes as needed for chest pain., Disp: 25 tablet, Rfl: 3   pantoprazole (PROTONIX) 40 MG tablet, Take 1  tablet (40 mg total) by mouth daily., Disp: 90 tablet, Rfl: 3   tamsulosin (FLOMAX) 0.4 MG CAPS capsule, TAKE 1 CAPSULE EVERY DAY, Disp: 90 capsule, Rfl: 1   divalproex (DEPAKOTE) 500 MG DR tablet, Take 4 tablets (2,000 mg total) by mouth at bedtime., Disp: 360 tablet, Rfl: 1   ezetimibe (ZETIA) 10 MG tablet, Take 1 tablet (10 mg total) by mouth daily. (Patient not taking: Reported on 04/03/2022), Disp: 90 tablet, Rfl: 1   lamoTRIgine (LAMICTAL) 100 MG tablet, Take 1 tablet (100 mg total) by  mouth 2 (two) times daily., Disp: 180 tablet, Rfl: 1   terbinafine (LAMISIL) 250 MG tablet, Take 250 mg by mouth daily., Disp: , Rfl:  Medication Side Effects: none  Family Medical/ Social History: Changes? no  MENTAL HEALTH EXAM:  There were no vitals taken for this visit.There is no height or weight on file to calculate BMI.  General Appearance: Casual and Well Groomed  Eye Contact:  Good  Speech:  Clear and Coherent and Normal Rate  Volume:  Normal  Mood:  Depressed  Affect:  Depressed  Thought Process:  Goal Directed and Descriptions of Associations: Intact  Orientation:  Full (Time, Place, and Person)  Thought Content: Logical   Suicidal Thoughts:  No  Homicidal Thoughts:  No  Memory:  WNL  Judgement:  Good  Insight:  Good  Psychomotor Activity:   Fine motor tremor in hands bilaterally with movement, he is not shaking his legs as severely as is his norm.    Concentration:  Concentration: Good  Recall:  Good  Fund of Knowledge: Good  Language: Good  Assets:  Desire for Improvement  ADL's:  Intact  Cognition: WNL  Prognosis:  Good   Labs  04/28/2022 Depakote level 91  DIAGNOSES:    ICD-10-CM   1. Bipolar 1 disorder, depressed, moderate ([place])  F31.32     2. Situational anxiety  F41.8     3. Tremor of both outstretched hands  R25.1     4. hx craniotomy for subdural hematoma  Z98.890     5. History of multiple substance abuse disorders  F19.91       Receiving Psychotherapy: Yes with Dr. [name]   RECOMMENDATIONS:  PDMP reviewed. No controlled substances since 01/30/2021. I provided 40 minutes of face to face time during this encounter, including time spent before and after the visit in records review, medical decision making, counseling pertinent to today's visit, and charting.  Increasing the Depakote at the last visit has caused worsening of the tremor I believe and even though it is helping the irritability and anger I think it is best to decrease and  focus more on the depression. We had a long discussion about Wellbutrin.  He was on that until early of 2020.  He was on XL 450 mg daily and was having increased anxiety and agitation from it.  If we start low and go up as needed I think it would help the melancholy anhedonia.  He has already been researching it and would like to try it.  He has a history of craniotomy because of a subdural hematoma, but has never had a seizure.  I confirmed this with him, he knows to let me know if he has any problems.  Benefits, risks and side effects were discussed and he accepts. No need to repeat the Depakote level since it was within normal range and we are decreasing the dose.  Start Wellbutrin SR 100 mg, 1  p.o. every morning. Decrease Depakote DR 500 mg to 4 p.o. nightly. Continue Lamictal 100 mg, 1 po bid. Continue Lexapro 20 mg p.o. daily. Continue therapy with Dr. [name]. Return in 4 weeks.   Donnal Moat, [name]

## 2022-05-06 ENCOUNTER — Other Ambulatory Visit: Payer: Self-pay | Admitting: *Deleted

## 2022-05-06 MED ORDER — MONTELUKAST SODIUM 10 MG PO TABS: 10.0000 mg | ORAL_TABLET | Freq: Every day | ORAL | 0 refills | Status: DC

## 2022-06-02 ENCOUNTER — Ambulatory Visit (INDEPENDENT_AMBULATORY_CARE_PROVIDER_SITE_OTHER): Payer: Self-pay | Admitting: Physician Assistant

## 2022-06-02 DIAGNOSIS — Z91199 Patient's noncompliance with other medical treatment and regimen due to unspecified reason: Secondary | ICD-10-CM

## 2022-06-02 NOTE — Progress Notes (Signed)
   Complete physical exam  Patient: Derrick [name]   DOB: 10/04/1997   66 y.o. Male  MRN: [id number]  Subjective:    No chief complaint on file.   Derrick [name] is a 66 y.o. male who presents today for a complete physical exam. She reports consuming a {diet types:17450} diet. {types:19826} She generally feels {DESC; WELL/FAIRLY WELL/POORLY:18703}. She reports sleeping {DESC; WELL/FAIRLY WELL/POORLY:18703}. She {does/does not:200015} have additional problems to discuss today.    Most recent fall risk assessment:    06/11/2022   10:42 AM  Fall Risk   Falls in the past year? 0  Number falls in past yr: 0  Injury with Fall? 0  Risk for fall due to : No Fall Risks  Follow up Falls evaluation completed     Most recent depression screenings:    06/11/2022   10:42 AM 05/02/2021   10:46 AM  PHQ 2/9 Scores  PHQ - 2 Score 0 0  PHQ- 9 Score 5     {VISON DENTAL STD PSA (Optional):27386}  {History (Optional):23778}  Patient Care Team: Jessup, Joy, NP as PCP - General (Nurse Practitioner)   Outpatient Medications Prior to Visit  Medication Sig   fluticasone (FLONASE) 50 MCG/ACT nasal spray Place 2 sprays into both nostrils in the morning and at bedtime. After 7 days, reduce to once daily.   norgestimate-ethinyl estradiol (SPRINTEC 28) 0.25-35 MG-MCG tablet Take 1 tablet by mouth daily.   Nystatin POWD Apply liberally to affected area 2 times per day   spironolactone (ALDACTONE) 100 MG tablet Take 1 tablet (100 mg total) by mouth daily.   No facility-administered medications prior to visit.    ROS        Objective:     There were no vitals taken for this visit. {Vitals History (Optional):23777}  Physical Exam   No results found for any visits on 07/17/22. {Show previous labs (optional):23779}    Assessment & Plan:    Routine Health Maintenance and Physical Exam  Immunization History  Administered Date(s) Administered   DTaP 12/18/1999, 02/13/2000,  04/23/2000, 01/07/2001, 07/23/2004   Hepatitis A 05/19/2008, 05/25/2009   Hepatitis B 10/05/1999, 11/12/1999, 04/23/2000   HiB (PRP-OMP) 12/18/1999, 02/13/2000, 04/23/2000, 01/07/2001   IPV 12/18/1999, 02/13/2000, 10/12/2000, 07/23/2004   Influenza,inj,Quad PF,6+ Mos 08/25/2014   Influenza-Unspecified 11/24/2012   MMR 10/12/2001, 07/23/2004   Meningococcal Polysaccharide 05/24/2012   Pneumococcal Conjugate-13 01/07/2001   Pneumococcal-Unspecified 04/23/2000, 07/07/2000   Tdap 05/24/2012   Varicella 10/12/2000, 05/19/2008    Health Maintenance  Topic Date Due   HIV Screening  Never done   Hepatitis C Screening  Never done   INFLUENZA VACCINE  07/15/2022   PAP-Cervical Cytology Screening  07/17/2022 (Originally 10/03/2020)   PAP SMEAR-Modifier  07/17/2022 (Originally 10/03/2020)   TETANUS/TDAP  07/17/2022 (Originally 05/24/2022)   HPV VACCINES  Discontinued   COVID-19 Vaccine  Discontinued    Discussed health benefits of physical activity, and encouraged her to engage in regular exercise appropriate for her age and condition.  Problem List Items Addressed This Visit   None Visit Diagnoses     Annual physical exam    -  Primary   Cervical cancer screening       Need for Tdap vaccination          No follow-ups on file.     Joy Jessup, NP   

## 2022-06-13 ENCOUNTER — Ambulatory Visit: Payer: Medicare HMO | Admitting: Psychiatry

## 2022-06-20 ENCOUNTER — Ambulatory Visit: Payer: Medicare HMO | Admitting: Physician Assistant

## 2022-06-20 ENCOUNTER — Encounter: Payer: Self-pay | Admitting: Physician Assistant

## 2022-06-20 DIAGNOSIS — F411 Generalized anxiety disorder: Secondary | ICD-10-CM

## 2022-06-20 DIAGNOSIS — R251 Tremor, unspecified: Secondary | ICD-10-CM | POA: Diagnosis not present

## 2022-06-20 DIAGNOSIS — R454 Irritability and anger: Secondary | ICD-10-CM

## 2022-06-20 DIAGNOSIS — F3162 Bipolar disorder, current episode mixed, moderate: Secondary | ICD-10-CM | POA: Diagnosis not present

## 2022-06-20 MED ORDER — QUETIAPINE FUMARATE ER 150 MG PO TB24: 150.0000 mg | ORAL_TABLET | Freq: Every day | ORAL | 1 refills | Status: DC

## 2022-06-20 NOTE — Progress Notes (Signed)
[facility]  Patient ID: Derrick [name],  MRN: [id number]  PCP: Gabriel Earing, [name]  Date of Evaluation: 06/20/2022  Time spent:40 minutes  Chief Complaint:  Chief Complaint   Follow-up     HISTORY/CURRENT STATUS: HPI for routine med check.  At the last visit approximately 6 weeks ago we added Wellbutrin hoping to help with energy and motivation as well as his mood.  States it has not helped at all with either issue.  He still gets angry very easily, has a short fuse and then becomes  depressed b/c angry. Sleeps a lot but no routine, depends on whether [name] is home, doesn't want to do things, although he does get out and work in the yard as needed.  He does not cry easily.  He isolates but this is not really any different than his norm.  ADLs and personal hygiene are normal.  Appetite is normal and weight is stable.  No suicidal or homicidal thoughts.  Other than the increased irritability and anger, he is not having manic symptoms.  No increased energy with decreased need for sleep, no risky behaviors but he is impulsive with rapid mood changes and says things he knows he should not say like cussing for example, no increased spending, no grandiosity, no paranoia, and no hallucinations.  No drug use.  Anxiety occurs but is not severe and is tolerable.  Still has a fine motor tremor of hands bilaterally but states it is not a huge problem.  Sometimes he has to use a spoon instead of a fork to eat with.  Denies dizziness, syncope, seizures, numbness, tingling, tics, unsteady gait, slurred speech, confusion. Denies muscle or joint pain, stiffness, or dystonia.  Individual Medical History/ Review of Systems: Changes? :No       Past medications for mental health diagnoses include: Zoloft, Prozac, Effexor XR, Lexapro, Depakote, Lamictal, Wellbutrin Xanax, Ambien, Sonata, Klonopin  Allergies: Patient has no known allergies.  Current Medications:  Current Outpatient  Medications:    aspirin EC 81 MG tablet, Take 81 mg by mouth daily. Swallow whole., Disp: , Rfl:    atorvastatin (LIPITOR) 80 MG tablet, Take 1 tablet (80 mg total) by mouth daily., Disp: 90 tablet, Rfl: 3   divalproex (DEPAKOTE) 500 MG DR tablet, Take 4 tablets (2,000 mg total) by mouth at bedtime., Disp: 360 tablet, Rfl: 1   escitalopram (LEXAPRO) 20 MG tablet, Take 1 tablet (20 mg total) by mouth daily., Disp: 90 tablet, Rfl: 3   isosorbide mononitrate (IMDUR) 30 MG 24 hr tablet, Take 0.5 tablets (15 mg total) by mouth daily., Disp: 45 tablet, Rfl: 1   lamoTRIgine (LAMICTAL) 100 MG tablet, Take 100 mg by mouth 2 (two) times daily., Disp: , Rfl:    montelukast (SINGULAIR) 10 MG tablet, Take 1 tablet (10 mg total) by mouth daily., Disp: 90 tablet, Rfl: 0   nitroGLYCERIN (NITROSTAT) 0.4 MG SL tablet, Place 1 tablet (0.4 mg total) under the tongue every 5 (five) minutes as needed for chest pain., Disp: 25 tablet, Rfl: 3   pantoprazole (PROTONIX) 40 MG tablet, Take 1 tablet (40 mg total) by mouth daily., Disp: 90 tablet, Rfl: 3   QUEtiapine Fumarate (SEROQUEL XR) 150 MG 24 hr tablet, Take 1 tablet (150 mg total) by mouth at bedtime., Disp: 30 tablet, Rfl: 1   tamsulosin (FLOMAX) 0.4 MG CAPS capsule, TAKE 1 CAPSULE EVERY DAY, Disp: 90 capsule, Rfl: 1   ezetimibe (ZETIA) 10 MG tablet, Take 1 tablet (10  mg total) by mouth daily. (Patient not taking: Reported on 04/03/2022), Disp: 90 tablet, Rfl: 1   ferrous sulfate 324 MG TBEC, Take 324 mg by mouth daily with breakfast. Takes 1 tablet per day for anemia (Patient not taking: Reported on 06/20/2022), Disp: , Rfl:    lamoTRIgine (LAMICTAL) 100 MG tablet, Take 1 tablet (100 mg total) by mouth 2 (two) times daily., Disp: 180 tablet, Rfl: 1   metoprolol succinate (TOPROL-XL) 25 MG 24 hr tablet, Take 1 tablet (25 mg total) by mouth daily. Take with or immediately following a meal., Disp: 90 tablet, Rfl: 3   terbinafine (LAMISIL) 250 MG tablet, Take 250 mg by mouth  daily. (Patient not taking: Reported on 06/20/2022), Disp: , Rfl:  Medication Side Effects: none  Family Medical/ Social History: Changes? no  MENTAL HEALTH EXAM:  There were no vitals taken for this visit.There is no height or weight on file to calculate BMI.  General Appearance: Casual and Well Groomed  Eye Contact:  Good  Speech:  Clear and Coherent and Normal Rate  Volume:  Normal  Mood:  Anxious, Depressed, and Irritable  Affect:  Depressed and Anxious  Thought Process:  Goal Directed and Descriptions of Associations: Intact  Orientation:  Full (Time, Place, and Person)  Thought Content: Logical   Suicidal Thoughts:  No  Homicidal Thoughts:  No  Memory:  WNL  Judgement:  Good  Insight:  Good  Psychomotor Activity:   Fine motor tremor in hands bilaterally with movement, he is not shaking his legs as severely as is his norm.    Concentration:  Concentration: Good  Recall:  Good  Fund of Knowledge: Good  Language: Good  Assets:  Desire for Improvement  ADL's:  Intact  Cognition: WNL  Prognosis:  Good   Labs  09/20/2021 Glucose 91  06/26/2021 Lipid panel completely normal  DIAGNOSES:    ICD-10-CM   1. Bipolar 1 disorder, mixed, moderate ([name])  F31.62     2. Generalized anxiety disorder  F41.1     3. Irritability  R45.4     4. Tremor of both outstretched hands  R25.1        Receiving Psychotherapy: Yes with Dr. Marliss Czar   RECOMMENDATIONS:  PDMP reviewed. No controlled substances since 01/30/2021. I provided 40 minutes of face to face time during this encounter, including time spent before and after the visit in records review, medical decision making, counseling pertinent to today's visit, and charting.   We discussed treatment options.  He has not responded to the Wellbutrin at all.  He took a higher dose in the past which caused more agitation and anxiety so we agreed to stop the Wellbutrin at this time.  Because of his mild manic and depressive symptoms,  I recommend adding an atypical antipsychotic which can help both of those issues.  There is room to increase the Depakote which would help with the irritability component but would likely increase the tremor.  He understands and would like to try an antipsychotic.  I specifically recommend Seroquel as it is good for both mania and depression and a good side effect for him should be sleepiness, he should take it at the same time every evening and then hopefully he will sleep well throughout the night without needing naps during the day. Discussed potential metabolic side effects associated with atypical antipsychotics.  Labs will need to be drawn periodically to monitor metabolic function. Discussed potential risk for movement side effects. Patient understands and  accepts these risks and has been advised to contact office if any movement side effects occur.   D/c wellbutrin Continue Depakote DR 500 mg to 4 p.o. nightly. Continue Lamictal 100 mg, 1 po bid. Continue Lexapro 20 mg p.o. daily. Start Seroquel XR 150 mg, 1 p.o. nightly. Continue therapy with Dr. Marliss Czar. He will have labs drawn in the next few weeks for his annual physical and will get those results sent to me. Return in 5-6 weeks.   Melony Overly, [name]

## 2022-07-03 ENCOUNTER — Telehealth: Payer: Self-pay | Admitting: Physician Assistant

## 2022-07-03 NOTE — Telephone Encounter (Signed)
LVM to [facility] 

## 2022-07-03 NOTE — Telephone Encounter (Signed)
Pt LVM at 4:05p.  He said he would like to decrease the Quetiapine '150mg'$ .  He said his wife said he is staggering and dozing off.    Next appt 8/18

## 2022-07-04 NOTE — Telephone Encounter (Signed)
Left another VM to RC.  

## 2022-07-07 NOTE — Telephone Encounter (Signed)
Have called home # and cell number multiple times and do not receive a response. Do not have permission to leave a detailed VM.

## 2022-07-07 NOTE — Telephone Encounter (Signed)
Left another VM to RC.  

## 2022-07-08 NOTE — Telephone Encounter (Signed)
Noted  

## 2022-07-21 ENCOUNTER — Encounter: Payer: Self-pay | Admitting: Physician Assistant

## 2022-07-21 ENCOUNTER — Ambulatory Visit (INDEPENDENT_AMBULATORY_CARE_PROVIDER_SITE_OTHER): Payer: Medicare HMO | Admitting: Physician Assistant

## 2022-07-21 DIAGNOSIS — F319 Bipolar disorder, unspecified: Secondary | ICD-10-CM | POA: Diagnosis not present

## 2022-07-21 DIAGNOSIS — R251 Tremor, unspecified: Secondary | ICD-10-CM

## 2022-07-21 DIAGNOSIS — F411 Generalized anxiety disorder: Secondary | ICD-10-CM | POA: Diagnosis not present

## 2022-07-21 NOTE — Progress Notes (Signed)
[facility]  Patient ID: Derrick [name],  MRN: [id number]  PCP: [name], [name]  Date of Evaluation: 07/21/2022  Time spent:20 minutes  Chief Complaint:  Chief Complaint   Depression; Follow-up     HISTORY/CURRENT STATUS: HPI for routine med check.  We started Seroquel at [name]. Wife  says he only wants to take it b/c it makes him high. "I can't argue with that." They made his racing thoughts quiet down and helped him relax and go to sleep. It also helped his anger/irritability. States he doesn't want to do anything that will hurt her, so he'll just live with his sx. Can't get his mind to 'turn off' sometimes. Patient denies increased energy with decreased need for sleep, increased talkativeness, impulsivity or risky behaviors, increased spending, increased libido, grandiosity, paranoia, and no hallucinations.  He does yard work when it's cool enough but other than that, doesn't do much. Energy and motivation depend on how hot it is and if he slept much the night before. No extreme sadness, tearfulness, or feelings of hopelessness.  ADLs and personal hygiene are normal.   Denies any changes in concentration, making decisions, or remembering things.  Appetite has not changed.  Weight is stable. Denies increased anxiety. Not smoking pot.  Denies suicidal or homicidal thoughts.  Denies dizziness, syncope, seizures, numbness, tingling, tremor, tics, unsteady gait, slurred speech, confusion. Denies muscle or joint pain, stiffness, or dystonia.  Individual Medical History/ Review of Systems: Changes? :No       Past medications for mental health diagnoses include: Zoloft, Prozac, Effexor XR, Lexapro, Depakote, Lamictal, Wellbutrin Xanax, Ambien, Sonata, Klonopin  Allergies: Patient has no known allergies.  Current Medications:  Current Outpatient Medications:    aspirin EC 81 MG tablet, Take 81 mg by mouth daily. Swallow whole., Disp: , Rfl:    atorvastatin (LIPITOR) 80 MG  tablet, Take 1 tablet (80 mg total) by mouth daily., Disp: 90 tablet, Rfl: 3   divalproex (DEPAKOTE) 500 MG DR tablet, Take 4 tablets (2,000 mg total) by mouth at bedtime., Disp: 360 tablet, Rfl: 1   escitalopram (LEXAPRO) 20 MG tablet, Take 1 tablet (20 mg total) by mouth daily., Disp: 90 tablet, Rfl: 3   isosorbide mononitrate (IMDUR) 30 MG 24 hr tablet, Take 0.5 tablets (15 mg total) by mouth daily., Disp: 45 tablet, Rfl: 1   lamoTRIgine (LAMICTAL) 100 MG tablet, Take 100 mg by mouth 2 (two) times daily., Disp: , Rfl:    montelukast (SINGULAIR) 10 MG tablet, Take 1 tablet (10 mg total) by mouth daily., Disp: 90 tablet, Rfl: 0   nitroGLYCERIN (NITROSTAT) 0.4 MG SL tablet, Place 1 tablet (0.4 mg total) under the tongue every 5 (five) minutes as needed for chest pain., Disp: 25 tablet, Rfl: 3   pantoprazole (PROTONIX) 40 MG tablet, Take 1 tablet (40 mg total) by mouth daily., Disp: 90 tablet, Rfl: 3   tamsulosin (FLOMAX) 0.4 MG CAPS capsule, TAKE 1 CAPSULE EVERY DAY, Disp: 90 capsule, Rfl: 1   ezetimibe (ZETIA) 10 MG tablet, Take 1 tablet (10 mg total) by mouth daily. (Patient not taking: Reported on 04/03/2022), Disp: 90 tablet, Rfl: 1   ferrous sulfate 324 MG TBEC, Take 324 mg by mouth daily with breakfast. Takes 1 tablet per day for anemia (Patient not taking: Reported on 06/20/2022), Disp: , Rfl:    metoprolol succinate (TOPROL-XL) 25 MG 24 hr tablet, Take 1 tablet (25 mg total) by mouth daily. Take with or immediately following a meal.,  Disp: 90 tablet, Rfl: 3   terbinafine (LAMISIL) 250 MG tablet, Take 250 mg by mouth daily. (Patient not taking: Reported on 06/20/2022), Disp: , Rfl:  Medication Side Effects: none  Family Medical/ Social History: Changes? no  MENTAL HEALTH EXAM:  There were no vitals taken for this visit.There is no height or weight on file to calculate BMI.  General Appearance: Casual and Well Groomed  Eye Contact:  Good  Speech:  Clear and Coherent and Normal Rate  Volume:   Normal  Mood:  Euthymic  Affect:  Congruent  Thought Process:  Goal Directed and Descriptions of Associations: Intact  Orientation:  Full (Time, Place, and Person)  Thought Content: Logical   Suicidal Thoughts:  No  Homicidal Thoughts:  No  Memory:  WNL  Judgement:  Good  Insight:  Good  Psychomotor Activity:   mild fine motor tremor in hands bilat, not shaking his legs as much as usual    Concentration:  Concentration: Good  Recall:  Good  Fund of Knowledge: Good  Language: Good  Assets:  Desire for Improvement  ADL's:  Intact  Cognition: WNL  Prognosis:  Good    DIAGNOSES:    ICD-10-CM   1. Bipolar I disorder ([place])  F31.9     2. Generalized anxiety disorder  F41.1     3. Tremor of both outstretched hands  R25.1      Receiving Psychotherapy: Yes with Dr. [name]   RECOMMENDATIONS:  PDMP reviewed. No controlled substances since 01/30/2021. I provided 20 minutes of face to face time during this encounter, including time spent before and after the visit in records review, medical decision making, counseling pertinent to today's visit, and charting.    We discussed the Seroquel. I do think it helped him, but did cause more sedation than tolerable, at least to his wife. I recommend a lower dose, and wrote that out for him to ask [name] if she's comfortable with that. It will help the racing thoughts and help him go to sleep. And hopefully the irritability too, even at a lower dose. All sx are tolerable right now. If [name] agrees to restart Seroquel, I would give XR 50 mg q hs.  Continue Depakote DR 500 mg, 4 p.o. nightly. Continue Lexapro 20 mg p.o. daily. Continue Lamictal 100 mg, 1 po bid. Continue therapy with Dr. [name]. Return in 6 weeks.   Donnal Moat, [name]

## 2022-08-01 ENCOUNTER — Ambulatory Visit (INDEPENDENT_AMBULATORY_CARE_PROVIDER_SITE_OTHER): Payer: Medicare HMO | Admitting: Physician Assistant

## 2022-08-06 ENCOUNTER — Other Ambulatory Visit: Payer: Self-pay | Admitting: Family Medicine

## 2022-08-07 NOTE — Telephone Encounter (Signed)
Scheduled pt to see PCP for 6 mth ck on 09/03/22 at 11:15. Pt aware.

## 2022-08-07 NOTE — Telephone Encounter (Signed)
Derrick [name]. NTBS in next 4-6 weeks for 6 mos FU. Refill sent.

## 2022-08-25 ENCOUNTER — Ambulatory Visit: Payer: Medicare HMO | Admitting: Psychiatry

## 2022-08-25 DIAGNOSIS — F1991 Other psychoactive substance use, unspecified, in remission: Secondary | ICD-10-CM

## 2022-08-25 DIAGNOSIS — F319 Bipolar disorder, unspecified: Secondary | ICD-10-CM

## 2022-08-25 DIAGNOSIS — F068 Other specified mental disorders due to known physiological condition: Secondary | ICD-10-CM

## 2022-08-25 DIAGNOSIS — Z630 Problems in relationship with spouse or partner: Secondary | ICD-10-CM

## 2022-08-25 DIAGNOSIS — F411 Generalized anxiety disorder: Secondary | ICD-10-CM

## 2022-08-25 DIAGNOSIS — F902 Attention-deficit hyperactivity disorder, combined type: Secondary | ICD-10-CM | POA: Diagnosis not present
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
## 2022-08-26 DIAGNOSIS — S52501A Unspecified fracture of the lower end of right radius, initial encounter for closed fracture: Secondary | ICD-10-CM | POA: Diagnosis not present

## 2022-08-26 DIAGNOSIS — S52591A Other fractures of lower end of right radius, initial encounter for closed fracture: Secondary | ICD-10-CM | POA: Diagnosis not present

## 2022-08-26 DIAGNOSIS — S52611A Displaced fracture of right ulna styloid process, initial encounter for closed fracture: Secondary | ICD-10-CM | POA: Diagnosis not present

## 2022-08-26 DIAGNOSIS — M25531 Pain in right wrist: Secondary | ICD-10-CM | POA: Diagnosis not present

## 2022-09-01 ENCOUNTER — Ambulatory Visit: Payer: Medicare HMO | Admitting: Physician Assistant

## 2022-09-01 DIAGNOSIS — M25531 Pain in right wrist: Secondary | ICD-10-CM | POA: Diagnosis not present

## 2022-09-01 DIAGNOSIS — S52571A Other intraarticular fracture of lower end of right radius, initial encounter for closed fracture: Secondary | ICD-10-CM | POA: Diagnosis not present

## 2022-09-03 ENCOUNTER — Ambulatory Visit: Payer: Medicare HMO | Admitting: Physician Assistant

## 2022-09-03 ENCOUNTER — Encounter: Payer: Self-pay | Admitting: Family Medicine

## 2022-09-03 ENCOUNTER — Ambulatory Visit (INDEPENDENT_AMBULATORY_CARE_PROVIDER_SITE_OTHER): Payer: Medicare HMO | Admitting: Family Medicine

## 2022-09-03 VITALS — BP 130/86 | HR 77 | Temp 97.7°F | Ht 71.0 in | Wt 175.0 lb

## 2022-09-03 DIAGNOSIS — I10 Essential (primary) hypertension: Secondary | ICD-10-CM

## 2022-09-03 DIAGNOSIS — J309 Allergic rhinitis, unspecified: Secondary | ICD-10-CM

## 2022-09-03 DIAGNOSIS — F3170 Bipolar disorder, currently in remission, most recent episode unspecified: Secondary | ICD-10-CM | POA: Diagnosis not present

## 2022-09-03 DIAGNOSIS — I5022 Chronic systolic (congestive) heart failure: Secondary | ICD-10-CM

## 2022-09-03 DIAGNOSIS — I2510 Atherosclerotic heart disease of native coronary artery without angina pectoris: Secondary | ICD-10-CM

## 2022-09-03 DIAGNOSIS — E7800 Pure hypercholesterolemia, unspecified: Secondary | ICD-10-CM

## 2022-09-03 DIAGNOSIS — Z23 Encounter for immunization: Secondary | ICD-10-CM | POA: Diagnosis not present

## 2022-09-03 MED ORDER — MONTELUKAST SODIUM 10 MG PO TABS: 10.0000 mg | ORAL_TABLET | Freq: Every day | ORAL | 0 refills | Status: DC

## 2022-09-03 NOTE — Patient Instructions (Signed)

## 2022-09-03 NOTE — Progress Notes (Signed)
Established Patient Office Visit  Subjective   Patient ID: Derrick [name], male    DOB: 17-May-1956  Age: 66 y.o. MRN: [id number]  Chief Complaint  Patient presents with   Medical Management of Chronic Issues    HPI  HTN Complaint with meds - Yes Current Medications - lopressor Exercising Regularly - No Watching Salt intake - Yes Pertinent ROS:  Headache - No Fatigue - No Visual Disturbances - No Chest pain - No Dyspnea - No Palpitations - No LE edema - No  He continues to follow with cardiology. He will see then in January. He takes lipitor daily. He has not been taking zetia. He also takes a daily aspirin and imdur daily. Denies focal weakness or myalgias.   2. Seasonal allergies He takes singular daily. Reports that this controls his symptoms well.  3. Bipolar disorder Managed by psychiatry. Reports well controlled.      09/03/2022   11:27 AM 09/03/2022   11:23 AM 01/10/2022    9:34 AM  Depression screen PHQ 2/9  Decreased Interest 0 0 3  Down, Depressed, Hopeless 1 0 3  PHQ - 2 Score 1 0 6  Altered sleeping 1  3  Tired, decreased energy 1  3  Change in appetite 0  0  Feeling bad or failure about yourself  1  0  Trouble concentrating 3  0  Moving slowly or fidgety/restless 1  2  Suicidal thoughts 1    PHQ-9 Score 9  14  Difficult doing work/chores Somewhat difficult        09/03/2022   11:29 AM 06/26/2021    1:09 PM 10/02/2020    8:21 AM 04/18/2020    2:50 PM  GAD 7 : Generalized Anxiety Score  Nervous, Anxious, on Edge '3 3 3 1  ' Control/stop worrying '3 3 3 ' 0  Worry too much - different things '3 3 3 ' 0  Trouble relaxing 0 3 0 1  Restless '3 3 1 1  ' Easily annoyed or irritable '3 3 3 ' 0  Afraid - awful might happen 0 1 0 0  Total GAD 7 Score '15 19 13 3  ' Anxiety Difficulty Very difficult Not difficult at all  Somewhat difficult        ROS As per HPI.   Objective:     BP 130/86   Pulse 77   Temp 97.7 F (36.5 C)   Ht '5\' 11"'  (1.803 m)   Wt 175  lb (79.4 kg)   SpO2 96%   BMI 24.41 kg/m    Physical Exam Vitals and nursing note reviewed.  Constitutional:      General: He is not in acute distress.    Appearance: He is not ill-appearing, toxic-appearing or diaphoretic.  HENT:     Head: Normocephalic and atraumatic.  Eyes:     General:        Right eye: No discharge.        Left eye: No discharge.     Pupils: Pupils are equal, round, and reactive to light.  Neck:     Vascular: No carotid bruit.  Cardiovascular:     Rate and Rhythm: Normal rate and regular rhythm.     Heart sounds: Normal heart sounds. No murmur heard. Pulmonary:     Effort: Pulmonary effort is normal. No respiratory distress.     Breath sounds: Normal breath sounds.  Abdominal:     General: Bowel sounds are normal.     Palpations: Abdomen is soft.  Musculoskeletal:     Cervical back: No tenderness.     Right lower leg: No edema.     Left lower leg: No edema.  Skin:    General: Skin is warm and dry.  Neurological:     General: No focal deficit present.     Mental Status: He is alert and oriented to person, place, and time.     Motor: No weakness.     Gait: Gait normal.  Psychiatric:        Mood and Affect: Mood normal.        Behavior: Behavior normal.      No results found for any visits on 09/03/22.    The ASCVD Risk score (Arnett DK, et al., 2019) failed to calculate for the following reasons:   The valid total cholesterol range is 130 to 320 mg/dL    Assessment & Plan:   Derrick [name] was seen today for medical management of chronic issues.  Diagnoses and all orders for this visit:  Primary hypertension Well controlled on current regimen. Labs pending.  -     CBC with Differential/Platelet -     CMP14+EGFR -     Lipid panel -     TSH  Hypercholesteremia On statin. Labs pending.  -     CBC with Differential/Platelet -     CMP14+EGFR -     Lipid panel  Chronic systolic heart failure ([name]) Coronary artery disease involving native  coronary artery of native heart without angina pectoris Managed by cardiology. On imdur, lopressor, aspirin, lipitor. Labs pending.  -     CBC with Differential/Platelet -     CMP14+EGFR -     Lipid panel  Bipolar disorder in partial remission, most recent episode unspecified type [facility]) Managed by psychiatry.  Need for immunization against influenza Flu vaccine today.   Need for vaccination Shingles vaccine today.   Chronic allergic rhinitis Well controlled on current regimen.  -     montelukast (SINGULAIR) 10 MG tablet; Take 1 tablet (10 mg total) by mouth daily.   Return in about 6 months (around 03/04/2023) for chronic follow up.  The patient indicates understanding of these issues and agrees with the plan.    [name], [name]

## 2022-09-04 LAB — CBC WITH DIFFERENTIAL/PLATELET
Basophils Absolute: 0 10*3/uL (ref 0.0–0.2)
Basos: 0 %
EOS (ABSOLUTE): 0.1 10*3/uL (ref 0.0–0.4)
Eos: 2 %
Hematocrit: 41.6 % (ref 37.5–51.0)
Hemoglobin: 14.1 g/dL (ref 13.0–17.7)
Immature Grans (Abs): 0 10*3/uL (ref 0.0–0.1)
Immature Granulocytes: 1 %
Lymphocytes Absolute: 1.9 10*3/uL (ref 0.7–3.1)
Lymphs: 36 %
MCH: 33.4 pg — ABNORMAL HIGH (ref 26.6–33.0)
MCHC: 33.9 g/dL (ref 31.5–35.7)
MCV: 99 fL — ABNORMAL HIGH (ref 79–97)
Monocytes Absolute: 0.5 10*3/uL (ref 0.1–0.9)
Monocytes: 9 %
Neutrophils Absolute: 2.8 10*3/uL (ref 1.4–7.0)
Neutrophils: 52 %
Platelets: 189 10*3/uL (ref 150–450)
RBC: 4.22 x10E6/uL (ref 4.14–5.80)
RDW: 12.6 % (ref 11.6–15.4)
WBC: 5.4 10*3/uL (ref 3.4–10.8)

## 2022-09-04 LAB — LIPID PANEL
Chol/HDL Ratio: 3.9 ratio (ref 0.0–5.0)
Cholesterol, Total: 136 mg/dL (ref 100–199)
HDL: 35 mg/dL — ABNORMAL LOW (ref >39)
LDL Chol Calc (NIH): 75 mg/dL (ref 0–99)
Triglycerides: 146 mg/dL (ref 0–149)
VLDL Cholesterol Cal: 26 mg/dL (ref 5–40)

## 2022-09-04 LAB — CMP14+EGFR
ALT: 21 IU/L (ref 0–44)
AST: 19 IU/L (ref 0–40)
Albumin/Globulin Ratio: 2.3 — ABNORMAL HIGH (ref 1.2–2.2)
Albumin: 4.4 g/dL (ref 3.9–4.9)
Alkaline Phosphatase: 66 IU/L (ref 44–121)
BUN/Creatinine Ratio: 13 (ref 10–24)
BUN: 11 mg/dL (ref 8–27)
Bilirubin Total: 0.3 mg/dL (ref 0.0–1.2)
CO2: 24 mmol/L (ref 20–29)
Calcium: 9.5 mg/dL (ref 8.6–10.2)
Chloride: 103 mmol/L (ref 96–106)
Creatinine, Ser: 0.82 mg/dL (ref 0.76–1.27)
Globulin, Total: 1.9 g/dL (ref 1.5–4.5)
Glucose: 111 mg/dL — ABNORMAL HIGH (ref 70–99)
Potassium: 4.4 mmol/L (ref 3.5–5.2)
Sodium: 145 mmol/L — ABNORMAL HIGH (ref 134–144)
Total Protein: 6.3 g/dL (ref 6.0–8.5)
eGFR: 98 mL/min/{1.73_m2} (ref >59)

## 2022-09-04 LAB — TSH: TSH: 2.34 u[IU]/mL (ref 0.450–4.500)

## 2022-09-09 DIAGNOSIS — S52571D Other intraarticular fracture of lower end of right radius, subsequent encounter for closed fracture with routine healing: Secondary | ICD-10-CM | POA: Diagnosis not present

## 2022-09-22 DIAGNOSIS — S52571D Other intraarticular fracture of lower end of right radius, subsequent encounter for closed fracture with routine healing: Secondary | ICD-10-CM | POA: Diagnosis not present

## 2022-09-22 DIAGNOSIS — S52611D Displaced fracture of right ulna styloid process, subsequent encounter for closed fracture with routine healing: Secondary | ICD-10-CM | POA: Diagnosis not present

## 2022-09-22 DIAGNOSIS — R600 Localized edema: Secondary | ICD-10-CM | POA: Diagnosis not present

## 2022-09-25 ENCOUNTER — Other Ambulatory Visit: Payer: Self-pay

## 2022-09-25 DIAGNOSIS — M25641 Stiffness of right hand, not elsewhere classified: Secondary | ICD-10-CM | POA: Diagnosis not present

## 2022-09-25 DIAGNOSIS — M25631 Stiffness of right wrist, not elsewhere classified: Secondary | ICD-10-CM | POA: Diagnosis not present

## 2022-09-25 DIAGNOSIS — R531 Weakness: Secondary | ICD-10-CM | POA: Diagnosis not present

## 2022-09-25 DIAGNOSIS — R202 Paresthesia of skin: Secondary | ICD-10-CM | POA: Diagnosis not present

## 2022-09-25 DIAGNOSIS — S52571D Other intraarticular fracture of lower end of right radius, subsequent encounter for closed fracture with routine healing: Secondary | ICD-10-CM | POA: Diagnosis not present

## 2022-09-25 MED ORDER — ATORVASTATIN CALCIUM 80 MG PO TABS: 80.0000 mg | ORAL_TABLET | Freq: Every day | ORAL | 1 refills | Status: DC

## 2022-09-30 DIAGNOSIS — R531 Weakness: Secondary | ICD-10-CM | POA: Diagnosis not present

## 2022-09-30 DIAGNOSIS — M25631 Stiffness of right wrist, not elsewhere classified: Secondary | ICD-10-CM | POA: Diagnosis not present

## 2022-09-30 DIAGNOSIS — S52571D Other intraarticular fracture of lower end of right radius, subsequent encounter for closed fracture with routine healing: Secondary | ICD-10-CM | POA: Diagnosis not present

## 2022-09-30 DIAGNOSIS — R202 Paresthesia of skin: Secondary | ICD-10-CM | POA: Diagnosis not present

## 2022-09-30 DIAGNOSIS — M25641 Stiffness of right hand, not elsewhere classified: Secondary | ICD-10-CM | POA: Diagnosis not present

## 2022-10-07 DIAGNOSIS — M25641 Stiffness of right hand, not elsewhere classified: Secondary | ICD-10-CM | POA: Diagnosis not present

## 2022-10-07 DIAGNOSIS — R531 Weakness: Secondary | ICD-10-CM | POA: Diagnosis not present

## 2022-10-07 DIAGNOSIS — M25631 Stiffness of right wrist, not elsewhere classified: Secondary | ICD-10-CM | POA: Diagnosis not present

## 2022-10-07 DIAGNOSIS — R202 Paresthesia of skin: Secondary | ICD-10-CM | POA: Diagnosis not present

## 2022-10-07 DIAGNOSIS — S52571D Other intraarticular fracture of lower end of right radius, subsequent encounter for closed fracture with routine healing: Secondary | ICD-10-CM | POA: Diagnosis not present

## 2022-10-09 DIAGNOSIS — S52571D Other intraarticular fracture of lower end of right radius, subsequent encounter for closed fracture with routine healing: Secondary | ICD-10-CM | POA: Diagnosis not present

## 2022-10-09 DIAGNOSIS — R202 Paresthesia of skin: Secondary | ICD-10-CM | POA: Diagnosis not present

## 2022-10-09 DIAGNOSIS — R531 Weakness: Secondary | ICD-10-CM | POA: Diagnosis not present

## 2022-10-09 DIAGNOSIS — M25631 Stiffness of right wrist, not elsewhere classified: Secondary | ICD-10-CM | POA: Diagnosis not present

## 2022-10-09 DIAGNOSIS — M25641 Stiffness of right hand, not elsewhere classified: Secondary | ICD-10-CM | POA: Diagnosis not present

## 2022-10-14 DIAGNOSIS — S52571D Other intraarticular fracture of lower end of right radius, subsequent encounter for closed fracture with routine healing: Secondary | ICD-10-CM | POA: Diagnosis not present

## 2022-10-14 DIAGNOSIS — R202 Paresthesia of skin: Secondary | ICD-10-CM | POA: Diagnosis not present

## 2022-10-14 DIAGNOSIS — M25631 Stiffness of right wrist, not elsewhere classified: Secondary | ICD-10-CM | POA: Diagnosis not present

## 2022-10-14 DIAGNOSIS — M25641 Stiffness of right hand, not elsewhere classified: Secondary | ICD-10-CM | POA: Diagnosis not present

## 2022-10-14 DIAGNOSIS — R531 Weakness: Secondary | ICD-10-CM | POA: Diagnosis not present

## 2022-10-15 ENCOUNTER — Ambulatory Visit: Payer: Medicare HMO | Admitting: Psychiatry

## 2022-10-16 ENCOUNTER — Telehealth: Payer: Self-pay | Admitting: Physician Assistant

## 2022-10-16 NOTE — Telephone Encounter (Signed)
Lvm for pt to schedule

## 2022-10-16 NOTE — Telephone Encounter (Signed)
Please call patient to schedule an appt.-

## 2022-10-16 NOTE — Telephone Encounter (Signed)
Patient has N/S or canceled a few appt lately. Told him he would need to make an appt to discuss medication.

## 2022-10-16 NOTE — Telephone Encounter (Signed)
Patient lvm stating he would like to revisit a medication he was taking for anxiety. Patient was seen in the office on 07/21/22. He cancelled 8/18 and 9/18, and NS on 9/20. No follow up scheduled.  Contact information # [phone]

## 2022-10-17 ENCOUNTER — Ambulatory Visit: Payer: Medicare HMO | Admitting: Psychiatry

## 2022-10-17 DIAGNOSIS — Z630 Problems in relationship with spouse or partner: Secondary | ICD-10-CM | POA: Diagnosis not present

## 2022-10-17 DIAGNOSIS — F902 Attention-deficit hyperactivity disorder, combined type: Secondary | ICD-10-CM

## 2022-10-17 DIAGNOSIS — F411 Generalized anxiety disorder: Secondary | ICD-10-CM

## 2022-10-17 DIAGNOSIS — F068 Other specified mental disorders due to known physiological condition: Secondary | ICD-10-CM

## 2022-10-17 DIAGNOSIS — Z9889 Other specified postprocedural states: Secondary | ICD-10-CM

## 2022-10-17 DIAGNOSIS — F319 Bipolar disorder, unspecified: Secondary | ICD-10-CM

## 2022-10-17 DIAGNOSIS — F1991 Other psychoactive substance use, unspecified, in remission: Secondary | ICD-10-CM | POA: Diagnosis not present
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
## 2022-10-20 NOTE — Addendum Note (Signed)
Addended by: Baldomero Lamy B on: 10/20/2022 03:52 PM   Modules accepted: Orders

## 2022-10-21 DIAGNOSIS — R531 Weakness: Secondary | ICD-10-CM | POA: Diagnosis not present

## 2022-10-21 DIAGNOSIS — M25631 Stiffness of right wrist, not elsewhere classified: Secondary | ICD-10-CM | POA: Diagnosis not present

## 2022-10-21 DIAGNOSIS — R202 Paresthesia of skin: Secondary | ICD-10-CM | POA: Diagnosis not present

## 2022-10-21 DIAGNOSIS — S52571D Other intraarticular fracture of lower end of right radius, subsequent encounter for closed fracture with routine healing: Secondary | ICD-10-CM | POA: Diagnosis not present

## 2022-10-21 DIAGNOSIS — M25641 Stiffness of right hand, not elsewhere classified: Secondary | ICD-10-CM | POA: Diagnosis not present

## 2022-10-23 DIAGNOSIS — M25631 Stiffness of right wrist, not elsewhere classified: Secondary | ICD-10-CM | POA: Diagnosis not present

## 2022-10-23 DIAGNOSIS — M25641 Stiffness of right hand, not elsewhere classified: Secondary | ICD-10-CM | POA: Diagnosis not present

## 2022-10-23 DIAGNOSIS — R202 Paresthesia of skin: Secondary | ICD-10-CM | POA: Diagnosis not present

## 2022-10-23 DIAGNOSIS — S52571D Other intraarticular fracture of lower end of right radius, subsequent encounter for closed fracture with routine healing: Secondary | ICD-10-CM | POA: Diagnosis not present

## 2022-10-23 DIAGNOSIS — R531 Weakness: Secondary | ICD-10-CM | POA: Diagnosis not present

## 2022-10-30 DIAGNOSIS — M25641 Stiffness of right hand, not elsewhere classified: Secondary | ICD-10-CM | POA: Diagnosis not present

## 2022-10-30 DIAGNOSIS — R531 Weakness: Secondary | ICD-10-CM | POA: Diagnosis not present

## 2022-10-30 DIAGNOSIS — M25631 Stiffness of right wrist, not elsewhere classified: Secondary | ICD-10-CM | POA: Diagnosis not present

## 2022-10-30 DIAGNOSIS — R202 Paresthesia of skin: Secondary | ICD-10-CM | POA: Diagnosis not present

## 2022-10-30 DIAGNOSIS — S52571D Other intraarticular fracture of lower end of right radius, subsequent encounter for closed fracture with routine healing: Secondary | ICD-10-CM | POA: Diagnosis not present

## 2022-11-03 ENCOUNTER — Encounter: Payer: Self-pay | Admitting: Physician Assistant

## 2022-11-03 ENCOUNTER — Ambulatory Visit (INDEPENDENT_AMBULATORY_CARE_PROVIDER_SITE_OTHER): Payer: Medicare HMO | Admitting: Physician Assistant

## 2022-11-03 DIAGNOSIS — Z630 Problems in relationship with spouse or partner: Secondary | ICD-10-CM

## 2022-11-03 DIAGNOSIS — F418 Other specified anxiety disorders: Secondary | ICD-10-CM

## 2022-11-03 DIAGNOSIS — F1991 Other psychoactive substance use, unspecified, in remission: Secondary | ICD-10-CM | POA: Diagnosis not present

## 2022-11-03 DIAGNOSIS — F411 Generalized anxiety disorder: Secondary | ICD-10-CM | POA: Diagnosis not present

## 2022-11-03 DIAGNOSIS — R454 Irritability and anger: Secondary | ICD-10-CM

## 2022-11-03 DIAGNOSIS — F3162 Bipolar disorder, current episode mixed, moderate: Secondary | ICD-10-CM

## 2022-11-03 MED ORDER — QUETIAPINE FUMARATE ER 150 MG PO TB24: 150.0000 mg | ORAL_TABLET | Freq: Every day | ORAL | 0 refills | Status: DC

## 2022-11-03 MED ORDER — ESCITALOPRAM OXALATE 20 MG PO TABS: 20.0000 mg | ORAL_TABLET | Freq: Every day | ORAL | 3 refills | Status: DC

## 2022-11-03 MED ORDER — DIVALPROEX SODIUM 500 MG PO DR TAB: 2000.0000 mg | DELAYED_RELEASE_TABLET | Freq: Every day | ORAL | 3 refills | Status: DC

## 2022-11-03 MED ORDER — ALPRAZOLAM 0.5 MG PO TABS: 0.2500 mg | ORAL_TABLET | Freq: Two times a day (BID) | ORAL | 0 refills | Status: DC | PRN

## 2022-11-03 MED ORDER — LAMOTRIGINE 100 MG PO TABS: 100.0000 mg | ORAL_TABLET | Freq: Two times a day (BID) | ORAL | 3 refills | Status: DC

## 2022-11-03 NOTE — Progress Notes (Signed)
[facility]  Patient ID: Derrick [name],  MRN: [id number]  PCP: [name], [name]  Date of Evaluation: 11/03/2022  Time spent:30 minutes  Chief Complaint:  Chief Complaint   Follow-up    HISTORY/CURRENT STATUS: HPI for routine med check.  More irritable and angry, very anxious too.  He is having ruminating thoughts about something that happened 40 years ago.  (He does not go into detail but has discussed with Dr. Rica Mote.)  Cannot get these things off his mind.  It is affecting his marriage.  His wife is to the point that she wants him to go back on Seroquel and Xanax.  When he took them in the past they were helpful.  He just did not want to be on so many medications. Patient denies increased energy with decreased need for sleep, increased talkativeness, impulsivity or risky behaviors, increased spending, increased libido, grandiosity, paranoia, or hallucinations.  Patient is able to enjoy things.  Energy and motivation are good.  He does feel hopeless at times, wondering if he will ever stop obsessing about things.  He is not having panic attacks but feels like the anxiety could turn into panic attacks.  He is not sleeping well, has racing thoughts that keep him from falling asleep and if he does wake up during the night he starts thinking about things and has a hard time going back to sleep. ADLs and personal hygiene are normal.   Denies any changes in concentration, making decisions, or remembering things.  Appetite has not changed.  Weight is stable.  Denies suicidal or homicidal thoughts.  Denies dizziness, syncope, seizures, numbness, tingling, tremor, tics, unsteady gait, slurred speech, confusion. Denies muscle or joint pain, stiffness, or dystonia.  Individual Medical History/ Review of Systems: Changes? :No       Past medications for mental health diagnoses include: Zoloft, Prozac, Effexor XR, Lexapro, Depakote, Lamictal, Wellbutrin Xanax, Ambien, Sonata,  Klonopin, Seroquel  Allergies: Patient has no known allergies.  Current Medications:  Current Outpatient Medications:    ALPRAZolam (XANAX) 0.5 MG tablet, Take 0.5-1 tablets (0.25-0.5 mg total) by mouth 2 (two) times daily as needed for anxiety., Disp: 30 tablet, Rfl: 0   aspirin EC 81 MG tablet, Take 81 mg by mouth daily. Swallow whole., Disp: , Rfl:    atorvastatin (LIPITOR) 80 MG tablet, Take 1 tablet (80 mg total) by mouth daily., Disp: 90 tablet, Rfl: 1   isosorbide mononitrate (IMDUR) 30 MG 24 hr tablet, Take 0.5 tablets (15 mg total) by mouth daily., Disp: 45 tablet, Rfl: 1   montelukast (SINGULAIR) 10 MG tablet, Take 1 tablet (10 mg total) by mouth daily., Disp: 90 tablet, Rfl: 0   nitroGLYCERIN (NITROSTAT) 0.4 MG SL tablet, Place 1 tablet (0.4 mg total) under the tongue every 5 (five) minutes as needed for chest pain., Disp: 25 tablet, Rfl: 3   pantoprazole (PROTONIX) 40 MG tablet, Take 1 tablet (40 mg total) by mouth daily., Disp: 90 tablet, Rfl: 3   QUEtiapine Fumarate (SEROQUEL XR) 150 MG 24 hr tablet, Take 1 tablet (150 mg total) by mouth at bedtime., Disp: 30 tablet, Rfl: 0   divalproex (DEPAKOTE) 500 MG DR tablet, Take 4 tablets (2,000 mg total) by mouth at bedtime., Disp: 360 tablet, Rfl: 3   escitalopram (LEXAPRO) 20 MG tablet, Take 1 tablet (20 mg total) by mouth daily., Disp: 90 tablet, Rfl: 3   ezetimibe (ZETIA) 10 MG tablet, Take 1 tablet (10 mg total) by mouth daily. (Patient  not taking: Reported on 04/03/2022), Disp: 90 tablet, Rfl: 1   lamoTRIgine (LAMICTAL) 100 MG tablet, Take 1 tablet (100 mg total) by mouth 2 (two) times daily., Disp: 180 tablet, Rfl: 3   metoprolol succinate (TOPROL-XL) 25 MG 24 hr tablet, Take 1 tablet (25 mg total) by mouth daily. Take with or immediately following a meal., Disp: 90 tablet, Rfl: 3 Medication Side Effects: none  Family Medical/ Social History: Changes? no  MENTAL HEALTH EXAM:  There were no vitals taken for this visit.There is no  height or weight on file to calculate BMI.  General Appearance: Casual and Well Groomed  Eye Contact:  Good  Speech:  Clear and Coherent and Normal Rate  Volume:  Normal  Mood:  Anxious, Depressed, Hopeless, and Irritable  Affect:  Depressed and Anxious  Thought Process:  Goal Directed and Descriptions of Associations: Intact  Orientation:  Full (Time, Place, and Person)  Thought Content: Logical   Suicidal Thoughts:  No  Homicidal Thoughts:  No  Memory:  WNL  Judgement:  Good  Insight:  Good  Psychomotor Activity:   Mild fine motor tremor in hands bilaterally.    Concentration:  Concentration: Good  Recall:  Good  Fund of Knowledge: Good  Language: Good  Assets:  Desire for Improvement  ADL's:  Intact  Cognition: WNL  Prognosis:  Good   Labs 09/03/2022 CBC nl CMP Glucose 111, Na 145, otherwise nl Lipids TC 136, Trig 146, HDL 35, LDL 75 TSH 2.3  DIAGNOSES:    ICD-10-CM   1. Bipolar 1 disorder, mixed, moderate ([name])  F31.62     2. Generalized anxiety disorder  F41.1     3. Irritability  R45.4     4. Situational anxiety  F41.8     5. History of multiple substance abuse disorders, in remission  F19.91     6. Marital stress  Z63.0      Receiving Psychotherapy: Yes  with Dr. [name]  RECOMMENDATIONS:  PDMP reviewed.  Hydrocodone filled 08/26/2022. I provided 30 minutes of face to face time during this encounter, including time spent before and after the visit in records review, medical decision making, counseling pertinent to today's visit, and charting.   I agree that restarting the Seroquel and Xanax would be helpful.  He has a history of substance abuse several decades ago so I have no concerns of giving him a benzodiazepine. Discussed potential metabolic side effects associated with atypical antipsychotics.  Labs will need to be drawn periodically to monitor metabolic function. Discussed potential risk for movement side effects. Patient understands and accepts  these risks and has been advised to contact office if any movement side effects occur.  Will need to draw labs in about 1 month so we can make sure lipids and glucose are still normal.   Restart Xanax 0.5 mg, 1/2-1 p.o. twice daily as needed anxiety. Continue Depakote DR 500 mg, 4 p.o. nightly. Continue Lexapro 20 mg p.o. daily. Continue Lamictal 100 mg, 1 po bid. Restart Seroquel XR 150 mg, 1 p.o. nightly. Continue therapy with Dr. [name]. Return in 3 weeks.   Donnal Moat, [name]

## 2022-11-04 ENCOUNTER — Other Ambulatory Visit: Payer: Self-pay

## 2022-11-04 MED ORDER — ISOSORBIDE MONONITRATE ER 30 MG PO TB24: 15.0000 mg | ORAL_TABLET | Freq: Every day | ORAL | 1 refills | Status: DC

## 2022-11-10 DIAGNOSIS — S6981XD Other specified injuries of right wrist, hand and finger(s), subsequent encounter: Secondary | ICD-10-CM | POA: Diagnosis not present

## 2022-11-10 DIAGNOSIS — M25531 Pain in right wrist: Secondary | ICD-10-CM | POA: Diagnosis not present

## 2022-11-10 DIAGNOSIS — S52571D Other intraarticular fracture of lower end of right radius, subsequent encounter for closed fracture with routine healing: Secondary | ICD-10-CM | POA: Diagnosis not present

## 2022-11-10 DIAGNOSIS — Z09 Encounter for follow-up examination after completed treatment for conditions other than malignant neoplasm: Secondary | ICD-10-CM | POA: Diagnosis not present

## 2022-11-12 ENCOUNTER — Other Ambulatory Visit: Payer: Self-pay

## 2022-11-12 ENCOUNTER — Telehealth: Payer: Self-pay | Admitting: Physician Assistant

## 2022-11-12 MED ORDER — QUETIAPINE FUMARATE ER 150 MG PO TB24: 150.0000 mg | ORAL_TABLET | Freq: Every day | ORAL | 0 refills | Status: DC

## 2022-11-12 NOTE — Telephone Encounter (Signed)
Pt called and said that [facility] doesn't have the seroquel xr 150 mg in stock. Delyle [facility] [place] [facility] in [facility]

## 2022-11-12 NOTE — Telephone Encounter (Signed)
Rx sent 

## 2022-11-14 ENCOUNTER — Ambulatory Visit: Payer: Medicare HMO | Admitting: Psychiatry

## 2022-11-14 DIAGNOSIS — F411 Generalized anxiety disorder: Secondary | ICD-10-CM | POA: Diagnosis not present

## 2022-11-14 DIAGNOSIS — Z9889 Other specified postprocedural states: Secondary | ICD-10-CM | POA: Diagnosis not present

## 2022-11-14 DIAGNOSIS — F600 Paranoid personality disorder: Secondary | ICD-10-CM | POA: Diagnosis not present

## 2022-11-14 DIAGNOSIS — F068 Other specified mental disorders due to known physiological condition: Secondary | ICD-10-CM | POA: Diagnosis not present

## 2022-11-14 DIAGNOSIS — F1991 Other psychoactive substance use, unspecified, in remission: Secondary | ICD-10-CM | POA: Diagnosis not present

## 2022-11-14 DIAGNOSIS — Z630 Problems in relationship with spouse or partner: Secondary | ICD-10-CM

## 2022-11-14 DIAGNOSIS — F319 Bipolar disorder, unspecified: Secondary | ICD-10-CM

## 2022-11-14 DIAGNOSIS — F3162 Bipolar disorder, current episode mixed, moderate: Secondary | ICD-10-CM
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
## 2022-11-17 ENCOUNTER — Ambulatory Visit: Payer: Medicare HMO | Admitting: Physician Assistant

## 2022-11-26 ENCOUNTER — Encounter: Payer: Self-pay | Admitting: Physician Assistant

## 2022-11-26 ENCOUNTER — Ambulatory Visit: Payer: Medicare HMO | Admitting: Physician Assistant

## 2022-11-26 DIAGNOSIS — F418 Other specified anxiety disorders: Secondary | ICD-10-CM

## 2022-11-26 DIAGNOSIS — F3162 Bipolar disorder, current episode mixed, moderate: Secondary | ICD-10-CM | POA: Diagnosis not present

## 2022-11-26 DIAGNOSIS — F411 Generalized anxiety disorder: Secondary | ICD-10-CM

## 2022-11-26 MED ORDER — ALPRAZOLAM 0.5 MG PO TABS: 0.2500 mg | ORAL_TABLET | Freq: Two times a day (BID) | ORAL | 0 refills | Status: DC | PRN

## 2022-11-26 MED ORDER — QUETIAPINE FUMARATE ER 300 MG PO TB24: 300.0000 mg | ORAL_TABLET | Freq: Every day | ORAL | 0 refills | Status: DC

## 2022-11-26 NOTE — Progress Notes (Signed)
[facility]  Patient ID: Derrick [name],  MRN: [id number]  PCP: [name], [name]  Date of Evaluation: 11/26/2022  Time spent:20 minutes  Chief Complaint:  Chief Complaint   Anxiety; Depression; Follow-up; Insomnia    HISTORY/CURRENT STATUS: HPI for routine med check.  We restarted Seroquel and Xanax last month. Is much better!  He decided to increase the Seroquel XL to 1.5 pills and states it is working very well.  Sleeps well and feels rejuvenated in the morning. Not obsessing about things like he did.  Not as anxious, he has taken the Xanax a couple of times and it was helpful.  Not having panic attacks.  He does not do a lot but is able to enjoy things when he can.  Energy and motivation are fair to good.  No extreme sadness, tearfulness, or feelings of hopelessness.  ADLs and personal hygiene are normal.   Denies any changes in concentration, making decisions, or remembering things.  Appetite has not changed.  Weight is stable.   Denies suicidal or homicidal thoughts.  Patient denies increased energy with decreased need for sleep, increased talkativeness, racing thoughts, impulsivity or risky behaviors, increased spending, increased libido, grandiosity, increased irritability or anger, paranoia, or hallucinations.  Denies dizziness, syncope, seizures, numbness, tingling, tremor, tics, unsteady gait, slurred speech, confusion. Denies muscle or joint pain, stiffness, or dystonia. Denies unexplained weight loss, frequent infections, or sores that heal slowly.  No polyphagia, polydipsia, or polyuria. Denies visual changes or paresthesias.   Individual Medical History/ Review of Systems: Changes? :No       Past medications for mental health diagnoses include: Zoloft, Prozac, Effexor XR, Lexapro, Depakote, Lamictal, Wellbutrin Xanax, Ambien, Sonata, Klonopin, Seroquel  Allergies: Patient has no known allergies.  Current Medications:  Current Outpatient Medications:     aspirin EC 81 MG tablet, Take 81 mg by mouth daily. Swallow whole., Disp: , Rfl:    atorvastatin (LIPITOR) 80 MG tablet, Take 1 tablet (80 mg total) by mouth daily., Disp: 90 tablet, Rfl: 1   divalproex (DEPAKOTE) 500 MG DR tablet, Take 4 tablets (2,000 mg total) by mouth at bedtime., Disp: 360 tablet, Rfl: 3   escitalopram (LEXAPRO) 20 MG tablet, Take 1 tablet (20 mg total) by mouth daily., Disp: 90 tablet, Rfl: 3   isosorbide mononitrate (IMDUR) 30 MG 24 hr tablet, Take 0.5 tablets (15 mg total) by mouth daily., Disp: 45 tablet, Rfl: 1   lamoTRIgine (LAMICTAL) 100 MG tablet, Take 1 tablet (100 mg total) by mouth 2 (two) times daily., Disp: 180 tablet, Rfl: 3   montelukast (SINGULAIR) 10 MG tablet, Take 1 tablet (10 mg total) by mouth daily., Disp: 90 tablet, Rfl: 0   nitroGLYCERIN (NITROSTAT) 0.4 MG SL tablet, Place 1 tablet (0.4 mg total) under the tongue every 5 (five) minutes as needed for chest pain., Disp: 25 tablet, Rfl: 3   pantoprazole (PROTONIX) 40 MG tablet, Take 1 tablet (40 mg total) by mouth daily., Disp: 90 tablet, Rfl: 3   QUEtiapine (SEROQUEL XR) 300 MG 24 hr tablet, Take 1 tablet (300 mg total) by mouth at bedtime., Disp: 90 tablet, Rfl: 0   ALPRAZolam (XANAX) 0.5 MG tablet, Take 0.5-1 tablets (0.25-0.5 mg total) by mouth 2 (two) times daily as needed for anxiety., Disp: 30 tablet, Rfl: 0   ezetimibe (ZETIA) 10 MG tablet, Take 1 tablet (10 mg total) by mouth daily. (Patient not taking: Reported on 04/03/2022), Disp: 90 tablet, Rfl: 1   metoprolol succinate (TOPROL-XL)  25 MG 24 hr tablet, Take 1 tablet (25 mg total) by mouth daily. Take with or immediately following a meal., Disp: 90 tablet, Rfl: 3 Medication Side Effects: none  Family Medical/ Social History: Changes? no  MENTAL HEALTH EXAM:  There were no vitals taken for this visit.There is no height or weight on file to calculate BMI.  General Appearance: Casual and Well Groomed  Eye Contact:  Good  Speech:  Clear and  Coherent and Normal Rate  Volume:  Normal  Mood:  Euthymic  Affect:  Congruent  Thought Process:  Goal Directed and Descriptions of Associations: Intact  Orientation:  Full (Time, Place, and Person)  Thought Content: Logical   Suicidal Thoughts:  No  Homicidal Thoughts:  No  Memory:  WNL  Judgement:  Good  Insight:  Good  Psychomotor Activity:  Restlessness and shakes his legs most of the time through the appointment    Concentration:  Concentration: Good  Recall:  Good  Fund of Knowledge: Good  Language: Good  Assets:  Desire for Improvement  ADL's:  Intact  Cognition: WNL  Prognosis:  Good   DIAGNOSES:    ICD-10-CM   1. Bipolar 1 disorder, mixed, moderate ([name])  F31.62     2. Generalized anxiety disorder  F41.1     3. Situational anxiety  F41.8       Receiving Psychotherapy: Yes  with Dr. [name]  RECOMMENDATIONS:  PDMP reviewed.  Xanax filled 11/03/2022. I provided 20 minutes of face to face time during this encounter, including time spent before and after the visit in records review, medical decision making, counseling pertinent to today's visit, and charting.   I am glad to see him doing so much better.  He really should not split the Seroquel XR so I recommend increasing the dose up to 300 mg.  He would like to try that.  Continue Xanax 0.5 mg, 1/2-1 p.o. twice daily as needed anxiety. Continue Depakote DR 500 mg, 4 p.o. nightly. Continue Lexapro 20 mg p.o. daily. Continue Lamictal 100 mg, 1 po bid. Increase Seroquel XR to 300 mg, 1 p.o. nightly. Continue therapy with Dr. [name]. Return in 6 weeks.  Donnal Moat, [name]

## 2023-01-07 ENCOUNTER — Ambulatory Visit: Payer: Medicare HMO | Admitting: Physician Assistant

## 2023-01-12 ENCOUNTER — Ambulatory Visit: Payer: Medicare HMO | Admitting: Psychiatry

## 2023-01-12 DIAGNOSIS — Z630 Problems in relationship with spouse or partner: Secondary | ICD-10-CM | POA: Diagnosis not present

## 2023-01-12 DIAGNOSIS — Z9889 Other specified postprocedural states: Secondary | ICD-10-CM

## 2023-01-12 DIAGNOSIS — F3132 Bipolar disorder, current episode depressed, moderate: Secondary | ICD-10-CM

## 2023-01-12 DIAGNOSIS — F319 Bipolar disorder, unspecified: Secondary | ICD-10-CM | POA: Diagnosis not present

## 2023-01-12 DIAGNOSIS — F411 Generalized anxiety disorder: Secondary | ICD-10-CM

## 2023-01-12 DIAGNOSIS — F1991 Other psychoactive substance use, unspecified, in remission: Secondary | ICD-10-CM

## 2023-01-12 DIAGNOSIS — F068 Other specified mental disorders due to known physiological condition: Secondary | ICD-10-CM

## 2023-01-12 DIAGNOSIS — F600 Paranoid personality disorder: Secondary | ICD-10-CM | POA: Diagnosis not present
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
## 2023-01-22 ENCOUNTER — Ambulatory Visit: Payer: Medicare HMO | Admitting: Physician Assistant

## 2023-02-07 ENCOUNTER — Other Ambulatory Visit (HOSPITAL_BASED_OUTPATIENT_CLINIC_OR_DEPARTMENT_OTHER): Payer: Self-pay | Admitting: Nurse Practitioner

## 2023-02-07 ENCOUNTER — Other Ambulatory Visit: Payer: Self-pay | Admitting: Physician Assistant

## 2023-02-09 ENCOUNTER — Telehealth: Payer: Self-pay | Admitting: Physician Assistant

## 2023-02-09 ENCOUNTER — Other Ambulatory Visit: Payer: Self-pay

## 2023-02-09 ENCOUNTER — Ambulatory Visit: Payer: Medicare HMO | Admitting: Psychiatry

## 2023-02-09 DIAGNOSIS — F411 Generalized anxiety disorder: Secondary | ICD-10-CM

## 2023-02-09 DIAGNOSIS — Z630 Problems in relationship with spouse or partner: Secondary | ICD-10-CM

## 2023-02-09 DIAGNOSIS — F600 Paranoid personality disorder: Secondary | ICD-10-CM | POA: Diagnosis not present

## 2023-02-09 DIAGNOSIS — F3132 Bipolar disorder, current episode depressed, moderate: Secondary | ICD-10-CM

## 2023-02-09 DIAGNOSIS — Z9889 Other specified postprocedural states: Secondary | ICD-10-CM

## 2023-02-09 DIAGNOSIS — F1991 Other psychoactive substance use, unspecified, in remission: Secondary | ICD-10-CM

## 2023-02-09 MED ORDER — DIVALPROEX SODIUM 500 MG PO DR TAB: 2000.0000 mg | DELAYED_RELEASE_TABLET | Freq: Every day | ORAL | 3 refills | Status: DC

## 2023-02-09 NOTE — Telephone Encounter (Signed)
Sent!
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
## 2023-02-09 NOTE — Telephone Encounter (Signed)
Pt requested at checkout today a refill of generic Depakote to sent to [facility].  Next appt 3/4

## 2023-02-16 ENCOUNTER — Ambulatory Visit: Payer: Medicare HMO | Admitting: Physician Assistant

## 2023-03-09 ENCOUNTER — Other Ambulatory Visit: Payer: Self-pay | Admitting: *Deleted

## 2023-03-09 MED ORDER — ATORVASTATIN CALCIUM 80 MG PO TABS: 80.0000 mg | ORAL_TABLET | Freq: Every day | ORAL | 0 refills | Status: DC

## 2023-03-10 ENCOUNTER — Ambulatory Visit: Payer: Medicare HMO | Admitting: Psychiatry

## 2023-03-16 ENCOUNTER — Ambulatory Visit: Payer: Medicare HMO | Admitting: Physician Assistant

## 2023-03-28 ENCOUNTER — Other Ambulatory Visit: Payer: Self-pay | Admitting: Family Medicine

## 2023-03-28 DIAGNOSIS — J309 Allergic rhinitis, unspecified: Secondary | ICD-10-CM

## 2023-04-15 ENCOUNTER — Ambulatory Visit (INDEPENDENT_AMBULATORY_CARE_PROVIDER_SITE_OTHER): Payer: Medicare HMO | Admitting: Physician Assistant

## 2023-04-15 ENCOUNTER — Encounter: Payer: Self-pay | Admitting: Physician Assistant

## 2023-04-15 DIAGNOSIS — F319 Bipolar disorder, unspecified: Secondary | ICD-10-CM | POA: Diagnosis not present

## 2023-04-15 DIAGNOSIS — F411 Generalized anxiety disorder: Secondary | ICD-10-CM

## 2023-04-15 NOTE — Progress Notes (Signed)
[facility]  Patient ID: Derrick [name],  MRN: [id number]  PCP: Gabriel Earing, [name]  Date of Evaluation: 04/15/2023 Time spent:20 minutes  Chief Complaint:  Chief Complaint   Anxiety; Depression; Follow-up    HISTORY/CURRENT STATUS: HPI for routine med check.  Doing well with mental health.  Patient is able to enjoy things.  Energy and motivation are good.  Works in the yard, [employer], weed eats, does some housework.  No extreme sadness, tearfulness, or feelings of hopelessness.  Sleeps well.  ADLs and personal hygiene are normal.   Denies any changes in concentration, making decisions, or remembering things.  Appetite has not changed.  He's gained some weight lately though.   Denies suicidal or homicidal thoughts.  He's cutting the Seroquel in 1/2, and it's working well. Mood is 'even.' Patient denies increased energy with decreased need for sleep, increased talkativeness, racing thoughts, impulsivity or risky behaviors, increased spending, increased libido, grandiosity, increased irritability or anger, paranoia, or hallucinations.  Denies dizziness, syncope, seizures, numbness, tingling, tremor, tics, unsteady gait, slurred speech, confusion. Denies muscle or joint pain, stiffness, or dystonia. Denies unexplained weight loss, frequent infections, or sores that heal slowly.  No polyphagia, polydipsia, or polyuria. Denies visual changes or paresthesias.   Individual Medical History/ Review of Systems: Changes? :No       Past medications for mental health diagnoses include: Zoloft, Prozac, Effexor XR, Lexapro, Depakote, Lamictal, Wellbutrin Xanax, Ambien, Sonata, Klonopin, Seroquel  Allergies: Patient has no known allergies.  Current Medications:  Current Outpatient Medications:    ALPRAZolam (XANAX) 0.5 MG tablet, Take 0.5-1 tablets (0.25-0.5 mg total) by mouth 2 (two) times daily as needed for anxiety., Disp: 30 tablet, Rfl: 0   aspirin EC 81 MG tablet, Take 81 mg by  mouth daily. Swallow whole., Disp: , Rfl:    atorvastatin (LIPITOR) 80 MG tablet, Take 1 tablet (80 mg total) by mouth daily., Disp: 90 tablet, Rfl: 0   divalproex (DEPAKOTE) 500 MG DR tablet, Take 4 tablets (2,000 mg total) by mouth at bedtime., Disp: 360 tablet, Rfl: 3   escitalopram (LEXAPRO) 20 MG tablet, Take 1 tablet (20 mg total) by mouth daily., Disp: 90 tablet, Rfl: 3   isosorbide mononitrate (IMDUR) 30 MG 24 hr tablet, Take 0.5 tablets (15 mg total) by mouth daily., Disp: 45 tablet, Rfl: 1   lamoTRIgine (LAMICTAL) 100 MG tablet, Take 1 tablet (100 mg total) by mouth 2 (two) times daily., Disp: 180 tablet, Rfl: 3   metoprolol succinate (TOPROL-XL) 25 MG 24 hr tablet, TAKE 1 TABLET (25 MG) EVERY DAY. TAKE WITH OR IMMEDIATELY FOLLOWING A MEAL., Disp: 90 tablet, Rfl: 3   montelukast (SINGULAIR) 10 MG tablet, TAKE 1 TABLET EVERY DAY, Disp: 90 tablet, Rfl: 0   nitroGLYCERIN (NITROSTAT) 0.4 MG SL tablet, Place 1 tablet (0.4 mg total) under the tongue every 5 (five) minutes as needed for chest pain., Disp: 25 tablet, Rfl: 3   pantoprazole (PROTONIX) 40 MG tablet, Take 1 tablet (40 mg total) by mouth daily., Disp: 90 tablet, Rfl: 3   QUEtiapine (SEROQUEL XR) 300 MG 24 hr tablet, TAKE 1 TABLET AT BEDTIME (Patient taking differently: Take 150 mg by mouth at bedtime.), Disp: 90 tablet, Rfl: 3   ezetimibe (ZETIA) 10 MG tablet, Take 1 tablet (10 mg total) by mouth daily. (Patient not taking: Reported on 04/03/2022), Disp: 90 tablet, Rfl: 1 Medication Side Effects: none  Family Medical/ Social History: Changes? no  MENTAL HEALTH EXAM:  There were no  vitals taken for this visit.There is no height or weight on file to calculate BMI.  General Appearance: Casual and Well Groomed  Eye Contact:  Good  Speech:  Clear and Coherent and Normal Rate  Volume:  Normal  Mood:  Euthymic  Affect:  Congruent  Thought Process:  Goal Directed and Descriptions of Associations: Intact  Orientation:  Full (Time,  Place, and Person)  Thought Content: Logical   Suicidal Thoughts:  No  Homicidal Thoughts:  No  Memory:  WNL  Judgement:  Good  Insight:  Good  Psychomotor Activity:  Normal   Concentration:  Concentration: Good  Recall:  Good  Fund of Knowledge: Good  Language: Good  Assets:  Desire for Improvement  ADL's:  Intact  Cognition: WNL  Prognosis:  Good   DIAGNOSES:    ICD-10-CM   1. Bipolar I disorder ([name])  F31.9     2. Generalized anxiety disorder  F41.1      Receiving Psychotherapy: Yes  with Dr. Marliss Czar  RECOMMENDATIONS:  PDMP reviewed.  Xanax filled 11/27/2022. I provided 20 minutes of face to face time during this encounter, including time spent before and after the visit in records review, medical decision making, counseling pertinent to today's visit, and charting.   He is cutting the Seroquel 1/2 which isn't normal, but he's doing really well so prefers to continue current dose.   Continue Xanax 0.5 mg, 1/2-1 p.o. twice daily as needed anxiety. Continue Depakote DR 500 mg, 4 p.o. nightly. Continue Lexapro 20 mg p.o. daily. Continue Lamictal 100 mg, 1 po bid. Continue Seroquel XR 300 mg, 1/2 p.o. nightly. Continue therapy with Dr. Marliss Czar. Return in 3 months.  Melony Overly, [name]

## 2023-04-20 ENCOUNTER — Ambulatory Visit (INDEPENDENT_AMBULATORY_CARE_PROVIDER_SITE_OTHER): Payer: Medicare HMO | Admitting: Psychiatry

## 2023-04-21 NOTE — Progress Notes (Unsigned)
Office Visit    Patient Name: Derrick [name] Date of Encounter: 04/21/2023  Primary Care Provider:  Gabriel Earing, [name] Primary Cardiologist:  Napoleon Form, Leodis Rains, NP Primary Electrophysiologist: None   Past Medical History    Past Medical History:  Diagnosis Date   Anxiety    Bipolar 1 disorder [facility])    Bipolar disorder ([name]) 10/05/2009   Qualifier: Diagnosis of  By: Vikki Ports     BPH (benign prostatic hypertrophy)    CAD (coronary artery disease), native coronary artery    a. DES x 2 to RCA in 2004 b. 02/11/17 PCI w/DES x1 to mRCA   Coronary atherosclerosis 10/05/2009   Qualifier: Diagnosis of  By: Vikki Ports   ANGIOGRAPHIC DATA:  1. Left main coronary artery was free of critical disease.  2. The LAD coursed to the apex. There is mild luminal irregularity  including mild irregularity of the diagonal. No high grade areas of  stenosis were noted. There was faint collateralization of the distal  right coronary circulation.  3. The circumflex provided two major m   Depression    Dyspnea 09/20/2012   Essential and other specified forms of tremor 02/05/2013   HYPERCHOLESTEROLEMIA 10/05/2009   Qualifier: Diagnosis of  By: Vikki Ports     Hyperlipidemia    Memory loss 02/05/2013   Mitral regurgitation and aortic stenosis    MITRAL STENOSIS/ INSUFFICIENCY, NON-RHEUMATIC 01/06/2011   Qualifier: Diagnosis of  By: Riley Kill, MD, Johny Sax Study Conclusions  - Left ventricle: The cavity size was normal. Wall thickness was increased in a pattern of mild LVH. Systolic function was normal. The estimated ejection fraction was in the range of 55% to 60%. Wall motion was normal; there were no regional wall motion abnormalities. Left ventricular diastolic function parameters were   MVP (mitral valve prolapse)    Myocardial infarction ([name]) 2012   Obstructive sleep apnea 02/05/2013   OSA (obstructive sleep apnea) 03/22/2013   Parasomnia 02/05/2013   Sleep apnea    Sleep  disorder with cognitive complaints 10/26/2018   Subdural hematoma, post-traumatic ([name])    fell and hit his head on ice and urgent care and pcp but did not tell them he had fallen and hit his head.   Unspecified hereditary and idiopathic peripheral neuropathy 02/05/2013   Past Surgical History:  Procedure Laterality Date   COLONOSCOPY     CORONARY STENT INTERVENTION N/A 02/12/2017   Procedure: Coronary Stent Intervention;  Surgeon: Peter M Swaziland, MD;  Location: [facility] INVASIVE CV LAB;  Service: Cardiovascular;  Laterality: N/A;   CRANIOTOMY Left 01/28/2021   Procedure: CRANIOTOMY HEMATOMA EVACUATION SUBDURAL;  Surgeon: Tia Alert, MD;  Location: [facility] OR;  Service: Neurosurgery;  Laterality: Left;   LEFT HEART CATH AND CORONARY ANGIOGRAPHY N/A 02/12/2017   Procedure: Left Heart Cath and Coronary Angiography;  Surgeon: Laurey Morale, MD;  Location: [facility] INVASIVE CV LAB;  Service: Cardiovascular;  Laterality: N/A;   POLYPECTOMY      Allergies  No Known Allergies   History of Present Illness    Derrick [name]  is a 67 year old male with a PMH of CAD s/p STEMI 2004 with DES x 2 to RCA, ICM, mild to moderate MR, MVP, tobacco abuse, bipolar disorder, OSA (on CPAP), HLD  He was first seen by Dr. Shirlee Latch in 2014, for stable shortness of breath. Myoview was ordered and noted to have abnormalities, 2018 echo updated with EF decreased and repeat Myoview  revealing larger defect in inferior and mid inferior location. Cardiac cath was recommended and PCI completed.  He was seen by Dr. Shari Prows in 06/2021 and was noted to have good blood pressure and was still smoking 4 cigarettes/day and trying to quit.  He was seen in follow-up on 03/20/2022 and patient reported doing well and walking daily with his wife and dogs.  His blood pressure was noted to be under good control.  Zetia was discontinued due to increased diarrhea and patient's lipids were stable.  Since last being seen in the office patient reports  that he has been doing well with no new cardiac complaints.  He does report feeling exhausted and states that he sleeps greater than 12 hours/day.  His blood pressure today was well-controlled at 110/80 heart rate was 89 bpm.  He reports that his Seroquel was recently titrated upward and this may be the reason why he has so much fatigue.  I advised him to follow-up with his PCP for guidance on titration if he is feeling fatigue. He reports some shortness of breath with physical activity.  He is currently not walking his dog which he used to do frequently.  He has also still smoking 4 cigarettes/day with plans to cut back in the future.  He is compliant with his medications and denies any adverse reactions with his cardiac medications.  Patient denies chest pain, palpitations, dyspnea, PND, orthopnea, nausea, vomiting, dizziness, syncope, edema, weight gain, or early satiety.  Home Medications    Current Outpatient Medications  Medication Sig Dispense Refill   ALPRAZolam (XANAX) 0.5 MG tablet Take 0.5-1 tablets (0.25-0.5 mg total) by mouth 2 (two) times daily as needed for anxiety. 30 tablet 0   aspirin EC 81 MG tablet Take 81 mg by mouth daily. Swallow whole.     atorvastatin (LIPITOR) 80 MG tablet Take 1 tablet (80 mg total) by mouth daily. 90 tablet 0   divalproex (DEPAKOTE) 500 MG DR tablet Take 4 tablets (2,000 mg total) by mouth at bedtime. 360 tablet 3   escitalopram (LEXAPRO) 20 MG tablet Take 1 tablet (20 mg total) by mouth daily. 90 tablet 3   ezetimibe (ZETIA) 10 MG tablet Take 1 tablet (10 mg total) by mouth daily. (Patient not taking: Reported on 04/03/2022) 90 tablet 1   isosorbide mononitrate (IMDUR) 30 MG 24 hr tablet Take 0.5 tablets (15 mg total) by mouth daily. 45 tablet 1   lamoTRIgine (LAMICTAL) 100 MG tablet Take 1 tablet (100 mg total) by mouth 2 (two) times daily. 180 tablet 3   metoprolol succinate (TOPROL-XL) 25 MG 24 hr tablet TAKE 1 TABLET (25 MG) EVERY DAY. TAKE WITH OR  IMMEDIATELY FOLLOWING A MEAL. 90 tablet 3   montelukast (SINGULAIR) 10 MG tablet TAKE 1 TABLET EVERY DAY 90 tablet 0   nitroGLYCERIN (NITROSTAT) 0.4 MG SL tablet Place 1 tablet (0.4 mg total) under the tongue every 5 (five) minutes as needed for chest pain. 25 tablet 3   pantoprazole (PROTONIX) 40 MG tablet Take 1 tablet (40 mg total) by mouth daily. 90 tablet 3   QUEtiapine (SEROQUEL XR) 300 MG 24 hr tablet TAKE 1 TABLET AT BEDTIME (Patient taking differently: Take 150 mg by mouth at bedtime.) 90 tablet 3   No current facility-administered medications for this visit.     Review of Systems  Please see the history of present illness.    (+) Fatigue (+) Shortness of breath with exertion  All other systems reviewed and are otherwise  negative except as noted above.  Physical Exam    Wt Readings from Last 3 Encounters:  09/03/22 175 lb (79.4 kg)  03/20/22 181 lb (82.1 kg)  03/19/22 180 lb 3.2 oz (81.7 kg)   WU:JWJXB were no vitals filed for this visit.,There is no height or weight on file to calculate BMI.  Constitutional:      Appearance: Healthy appearance. Not in distress.  Neck:     Vascular: JVD normal.  Pulmonary:     Effort: Pulmonary effort is normal.     Breath sounds: No wheezing. No rales. Diminished in the bases Cardiovascular:     Normal rate. Regular rhythm. Normal S1. Normal S2.      Murmurs: There is no murmur.  Edema:    Peripheral edema absent.  Abdominal:     Palpations: Abdomen is soft non tender. There is no hepatomegaly.  Skin:    General: Skin is warm and dry.  Neurological:     General: No focal deficit present.     Mental Status: Alert and oriented to person, place and time.     Cranial Nerves: Cranial nerves are intact.  EKG/LABS/ Recent Cardiac Studies    ECG personally reviewed by me today -sinus rhythm with PVCs and rate of 89 bpm with no acute changes consistent with previous EKG.  Cardiac Studies & Procedures   CARDIAC  CATHETERIZATION  CARDIAC CATHETERIZATION 02/12/2017  Narrative  Mid RCA lesion, 90 %stenosed.  A STENT RESOLUTE ONYX 4.0X12 drug eluting stent was successfully placed.  Prox RCA to Mid RCA lesion, 90 %stenosed.  Post intervention, there is a 0% residual stenosis.  Successful stenting of the mid RCA with DES for in stent restenosis.  Plan: DAPT for one year with ASA and Plavix. May be suitable for same day discharge.  Findings Coronary Findings Diagnostic  Dominance: Right  Right Coronary Artery The lesion was previously treated using a drug eluting stent .  Intervention  Prox RCA to Mid RCA lesion Angioplasty (Also treats lesions: Mid RCA) Lesion crossed with guidewire using a WIRE ASAHI PROWATER 180CM. Pre-stent angioplasty was performed using a BALLOON EMERGE MR 3.0X12. Maximum pressure: 14 atm. A STENT RESOLUTE ONYX 4.0X12 drug eluting stent was successfully placed. Stent strut is well apposed. Post-stent angioplasty was performed using a BALLOON [place] EMERGE MR 4.0X8. Maximum pressure: 20 atm. The pre-interventional distal flow is normal (TIMI 3).  The post-interventional distal flow is normal (TIMI 3). The intervention was successful . No complications occurred at this lesion. There is a 0% residual stenosis post intervention.  Mid RCA lesion Angioplasty (Also treats lesions: Prox RCA to Mid RCA) See details in Prox RCA to Mid RCA lesion. There is a 0% residual stenosis post intervention.   CARDIAC CATHETERIZATION 02/12/2017  Narrative 95% in-stent restenosis in mid RCA, likely explains fall in EF and Cardiolite abnormality.  Plan for PCI today with Dr. Swaziland.  Findings Coronary Findings Diagnostic  Dominance: Right  Left Main No significant disease.  Left Anterior Descending No significant disease.  Left Circumflex Long OM1, small OM2.  No significant disease in LCx system.  Right Coronary Artery Long stented segment of proximal to mid RCA.  Serial 30%  in-stent restenoses in the proximal RCA with 95% in-stent restenosis in the mid RCA.  There are weak left to right collaterals.  Intervention  No interventions have been documented.   STRESS TESTS  MYOCARDIAL PERFUSION IMAGING 08/28/2017  Narrative  Nuclear stress EF: 37%.  Blood pressure demonstrated a  normal response to exercise.  There was no ST segment deviation noted during stress.  No T wave inversion was noted during stress.  Defect 1: There is a small defect of moderate severity present in the basal inferior and mid inferior location.  Findings consistent with prior myocardial infarction. No ischemia.  The left ventricular ejection fraction is moderately decreased (30-44%).   ECHOCARDIOGRAM  ECHOCARDIOGRAM COMPLETE 07/16/2021  Narrative ECHOCARDIOGRAM REPORT    Patient Name:   Derrick [name] Date of Exam: 07/16/2021 Medical Rec #:  [id number]      Height:       71.0 in Accession #:    [id number]     Weight:       172.0 lb Date of Birth:  1955-12-29      BSA:          1.978 m Patient Age:    65 years       BP:           111/71 mmHg Patient Gender: M              HR:           80 bpm. [facility]  Procedure: 2D Echo, Cardiac Doppler and Color Doppler  Indications:    I34.1 (ICD-10-CM) - Mitral valve prolapse  History:        Patient has prior history of Echocardiogram examinations, most recent 09/22/2018. CAD and Previous Myocardial Infarction, Mitral Valve Prolapse; Risk Factors:Former Smoker, Dyslipidemia and Family History of Coronary Artery Disease. OSA (obstructive sleep apnea).  Sonographer:    Celesta Gentile RCS Referring Phys: [id number] HEATHER E [place]  IMPRESSIONS   1. Left ventricular ejection fraction, by estimation, is 55 to 60%. The left ventricle has normal function. The left ventricle has no regional wall motion abnormalities. There is mild left ventricular hypertrophy. Left ventricular diastolic parameters are  indeterminate. 2. Right ventricular systolic function is normal. The right ventricular size is normal. Tricuspid regurgitation signal is inadequate for assessing PA pressure. 3. The mitral valve is abnormal, mildly thickened and with prolapse predominantly of the posterior leaflet. Mild mitral valve regurgitation made up of two small jets. 4. The aortic valve is tricuspid. Aortic valve regurgitation is not visualized. 5. The inferior vena cava is normal in size with greater than 50% respiratory variability, suggesting right atrial pressure of 3 mmHg.  Comparison(s): Prior images reviewed side by side. Mitral regurgitation has not progressed.  FINDINGS Left Ventricle: Left ventricular ejection fraction, by estimation, is 55 to 60%. The left ventricle has normal function. The left ventricle has no regional wall motion abnormalities. The left ventricular internal cavity size was normal in size. There is mild left ventricular hypertrophy. Left ventricular diastolic parameters are indeterminate.  Right Ventricle: The right ventricular size is normal. No increase in right ventricular wall thickness. Right ventricular systolic function is normal. Tricuspid regurgitation signal is inadequate for assessing PA pressure.  Left Atrium: Left atrial size was normal in size.  Right Atrium: Right atrial size was normal in size.  Pericardium: There is no evidence of pericardial effusion.  Mitral Valve: The mitral valve is abnormal. There is mild thickening of the mitral valve leaflet(s). Mild mitral annular calcification. Mild mitral valve regurgitation.  Tricuspid Valve: The tricuspid valve is grossly normal. Tricuspid valve regurgitation is trivial.  Aortic Valve: The aortic valve is tricuspid. Aortic valve regurgitation is not visualized.  Pulmonic Valve: The pulmonic valve was grossly normal. Pulmonic valve regurgitation is trivial.  Aorta: The aortic root is normal in size and structure.  Venous:  The inferior vena cava is normal in size with greater than 50% respiratory variability, suggesting right atrial pressure of 3 mmHg.  IAS/Shunts: No atrial level shunt detected by color flow Doppler.   LEFT VENTRICLE PLAX 2D LVIDd:         4.60 cm  Diastology LVIDs:         3.20 cm  LV e' medial:    6.42 cm/s LV PW:         1.20 cm  LV E/e' medial:  8.6 LV IVS:        1.20 cm  LV e' lateral:   7.62 cm/s LVOT diam:     2.00 cm  LV E/e' lateral: 7.2 LV SV:         59 LV SV Index:   30 LVOT Area:     3.14 cm   RIGHT VENTRICLE RV S prime:     10.80 cm/s TAPSE (M-mode): 1.8 cm  LEFT ATRIUM             Index       RIGHT ATRIUM           Index LA diam:        3.80 cm 1.92 cm/m  RA Area:     19.30 cm LA Vol (A2C):   53.1 ml 26.85 ml/m RA Volume:   52.30 ml  26.45 ml/m LA Vol (A4C):   42.4 ml 21.44 ml/m LA Biplane Vol: 46.1 ml 23.31 ml/m AORTIC VALVE LVOT Vmax:   94.70 cm/s LVOT Vmean:  59.000 cm/s LVOT VTI:    0.187 m  AORTA Ao Root diam: 3.30 cm  MITRAL VALVE MV Area (PHT): 2.39 cm    SHUNTS MV Decel Time: 317 msec    Systemic VTI:  0.19 m MV E velocity: 55.10 cm/s  Systemic Diam: 2.00 cm MV A velocity: 76.80 cm/s MV E/A ratio:  0.72  Nona Dell MD Electronically signed by Nona Dell MD Signature Date/Time: 07/16/2021/3:38:39 PM    Final      CARDIAC MRI  MR CARDIAC MORPHOLOGY W WO CONTRAST 10/23/2017  Narrative CLINICAL DATA:  Ischemic cardiomyopathy  EXAM: CARDIAC MRI  TECHNIQUE: The patient was scanned on a 1.5 Tesla GE magnet. A dedicated cardiac coil was used. Functional imaging was done using Fiesta sequences. 2,3, and 4 chamber views were done to assess for RWMA's. Modified Simpson's rule using a short axis stack was used to calculate an ejection fraction on a dedicated work [employer]. The patient received 26 cc of Multihance. After 10 minutes inversion recovery sequences were used to assess for infiltration and scar  tissue.  CONTRAST:  26 cc Multihance  FINDINGS: Limited images of the lung fields showed no significant abnormalities.  Normal left ventricular size and thickness. Hypokinetic basal inferior and basal inferoseptal walls. Akinetic mid inferior wall. LV EF 47%. Normal right ventricular size and systolic function. Normal left atrial size. Normal right atrial size. Mild mitral valve prolapse with mild-moderate mid to late systolic mitral regurgitation. Trileaflet aortic valve with no regurgitation or stenosis.  Delayed enhancement imaging showed 76-99% wall thickness subendocardial late gadolinium enhancement (LGE) in the mid inferior wall.  MEASUREMENTS: MEASUREMENTS LVEDV 141 mL  LVSV 66 mL  LVEF 47%  IMPRESSION: 1. Normal LV size with wall motion abnormalities noted above. EF 47%. Delayed enhancement images suggestive of prior infarction in the RCA territory.  2.  Normal RV  size and systolic function.  3. Mitral valve prolapse with mild to moderate mitral regurgitation.  Dalton Mclean   Electronically Signed By: Marca Ancona M.D. On: 10/23/2017 16:43            Lab Results  Component Value Date   WBC 5.4 09/03/2022   HGB 14.1 09/03/2022   HCT 41.6 09/03/2022   MCV 99 (H) 09/03/2022   PLT 189 09/03/2022   Lab Results  Component Value Date   CREATININE 0.82 09/03/2022   BUN 11 09/03/2022   NA 145 (H) 09/03/2022   K 4.4 09/03/2022   CL 103 09/03/2022   CO2 24 09/03/2022   Lab Results  Component Value Date   ALT 21 09/03/2022   AST 19 09/03/2022   ALKPHOS 66 09/03/2022   BILITOT 0.3 09/03/2022   Lab Results  Component Value Date   CHOL 136 09/03/2022   HDL 35 (L) 09/03/2022   LDLCALC 75 09/03/2022   TRIG 146 09/03/2022   CHOLHDL 3.9 09/03/2022    No results found for: "HGBA1C"   Assessment & Plan    1.  Coronary artery disease: -s/p inferior STEMI with DES x 2 to RCA in 2004  -Current GDMT with 81 mg aspirin, Lipitor 80 mg daily,  Imdur 30 mg daily and metoprolol 25 mg daily -Patient denies anginal symptoms and shortness of breath today.   2.  Hypertension: -Blood pressure today is 110/80 -Continue Toprol 25 mg daily  3.  Mitral valve prolapse: -Most recent TTE completed on 8/22 with mild LVH and no progression of mitral regurgitation.  Patient denies shortness of breath or edema in lower extremities. -Echo to be repeated in 2025   4.  Hyperlipidemia: -Last LDL was 45, at goal -Continue atorvastatin 80 mg daily  -Continue Zetia 10 mg  -Patient will return for recheck of lipids and LFTs while fasting   5.  Tobacco abuse: -Patient still smoking 4 cigarettes/day and states that he does not plan to quit.  We discussed disease progression and tobacco abuse. -I advised that if he is interested in stopping we will provide information regarding steps to take.  Disposition: Follow-up with Napoleon Form, Leodis Rains, NP or APP in 12 months    Medication Adjustments/Labs and Tests Ordered: Current medicines are reviewed at length with the patient today.  Concerns regarding medicines are outlined above.   Signed, Napoleon Form, Leodis Rains, NP 04/21/2023, 7:14 PM [facility]

## 2023-04-22 ENCOUNTER — Encounter: Payer: Self-pay | Admitting: Nurse Practitioner

## 2023-04-22 ENCOUNTER — Ambulatory Visit: Payer: Medicare HMO | Attending: Nurse Practitioner | Admitting: Nurse Practitioner

## 2023-04-22 VITALS — BP 110/80 | HR 89 | Ht 70.0 in | Wt 189.2 lb

## 2023-04-22 DIAGNOSIS — I10 Essential (primary) hypertension: Secondary | ICD-10-CM

## 2023-04-22 DIAGNOSIS — I341 Nonrheumatic mitral (valve) prolapse: Secondary | ICD-10-CM

## 2023-04-22 DIAGNOSIS — I25118 Atherosclerotic heart disease of native coronary artery with other forms of angina pectoris: Secondary | ICD-10-CM

## 2023-04-22 DIAGNOSIS — E7800 Pure hypercholesterolemia, unspecified: Secondary | ICD-10-CM

## 2023-04-22 DIAGNOSIS — F17200 Nicotine dependence, unspecified, uncomplicated: Secondary | ICD-10-CM

## 2023-04-22 MED ORDER — PANTOPRAZOLE SODIUM 40 MG PO TBEC: 40.0000 mg | DELAYED_RELEASE_TABLET | Freq: Every day | ORAL | 3 refills | Status: AC

## 2023-04-22 MED ORDER — ISOSORBIDE MONONITRATE ER 30 MG PO TB24: 15.0000 mg | ORAL_TABLET | Freq: Every day | ORAL | 1 refills | Status: DC

## 2023-04-22 NOTE — Patient Instructions (Addendum)
Medication Instructions:  Your physician recommends that you continue on your current medications as directed. Please refer to the Current Medication list given to you today. *If you need a refill on your cardiac medications before your next appointment, please call your pharmacy*   Lab Work: TODAY-BMET, MAG, & TSH FASTING LABS LFT & LIPIDS AT PATIENT CONVENIENCE  If you have labs (blood work) drawn today and your tests are completely normal, you will receive your results only by: MyChart Message (if you have MyChart) OR A paper copy in the mail If you have any lab test that is abnormal or we need to change your treatment, we will call you to review the results.   Testing/Procedures: NONE ORDERED   Follow-Up: At [facility], you and your health needs are our priority.  As part of our continuing mission to provide you with exceptional heart care, we have created designated Provider Care Teams.  These Care Teams include your primary Cardiologist (physician) and Advanced Practice Providers (APPs -  Physician Assistants and Nurse Practitioners) who all work together to provide you with the care you need, when you need it.  We recommend signing up for the patient portal called "MyChart".  Sign up information is provided on this After Visit Summary.  MyChart is used to connect with patients for Virtual Visits (Telemedicine).  Patients are able to view lab/test results, encounter notes, upcoming appointments, etc.  Non-urgent messages can be sent to your provider as well.   To learn more about what you can do with MyChart, go to [email or web address].    Your next appointment:   12 month(s)  Provider:   Napoleon Form, Leodis Rains, NP     Other Instructions  I WILL SEND A MESSAGE TO THE SLEEP COORDINATOR ABOUT A CPAP MACHINE

## 2023-04-23 LAB — BASIC METABOLIC PANEL
BUN/Creatinine Ratio: 10 (ref 10–24)
BUN: 10 mg/dL (ref 8–27)
CO2: 24 mmol/L (ref 20–29)
Calcium: 9.9 mg/dL (ref 8.6–10.2)
Chloride: 103 mmol/L (ref 96–106)
Creatinine, Ser: 0.97 mg/dL (ref 0.76–1.27)
Glucose: 127 mg/dL — ABNORMAL HIGH (ref 70–99)
Potassium: 4.7 mmol/L (ref 3.5–5.2)
Sodium: 142 mmol/L (ref 134–144)
eGFR: 87 mL/min/{1.73_m2} (ref >59)

## 2023-04-23 LAB — TSH: TSH: 2.48 u[IU]/mL (ref 0.450–4.500)

## 2023-04-23 LAB — MAGNESIUM: Magnesium: 1.9 mg/dL (ref 1.6–2.3)

## 2023-04-28 ENCOUNTER — Encounter: Payer: Self-pay | Admitting: Family Medicine

## 2023-04-28 ENCOUNTER — Ambulatory Visit: Payer: Medicare HMO | Admitting: Family Medicine

## 2023-04-28 VITALS — BP 104/74 | HR 89 | Temp 98.9°F | Ht 70.0 in | Wt 190.4 lb

## 2023-04-28 DIAGNOSIS — J309 Allergic rhinitis, unspecified: Secondary | ICD-10-CM | POA: Diagnosis not present

## 2023-04-28 DIAGNOSIS — Z7689 Persons encountering health services in other specified circumstances: Secondary | ICD-10-CM

## 2023-04-28 DIAGNOSIS — Z114 Encounter for screening for human immunodeficiency virus [HIV]: Secondary | ICD-10-CM | POA: Diagnosis not present

## 2023-04-28 NOTE — Assessment & Plan Note (Signed)
Continue with Montelukast 10 mg daily. He denies needing a refill on medication.

## 2023-04-28 NOTE — Patient Instructions (Addendum)
-  It was a pleasure to meet you today and I look forward to taking care of you. -Continue with all medications. Based on cardiology note from your previous labs, you need to speak with psychiatrist about changing the dose on Seroquel.  -Follow up for a physical in 1 month. -Make a visit for a AWV (Annual Wellness Visit) on the telephone.  -Make a lab visit when fasting, that means only water or black coffee, 6 hrs before lab appointment.

## 2023-04-28 NOTE — Progress Notes (Signed)
New Patient Office Visit  Subjective    Patient ID: Derrick [name], male    DOB: [birth year]  Age: 67 y.o. MRN: [id number]  CC:  Chief Complaint  Patient presents with   Establish Care    Pt is here today to Est. Care. Pt is not FASTING Pt reports no concerns.    HPI Derrick [name] presents to establish care with new provider.   Patients previous primary care provider: [facility] with Harlow Mares, [name]. Last visit was 06/26/2021. He has seen other providers in the office since then.   Specialist:  Cardiologist: [facility] with Robin Searing, NP  Psychiatrist: [facility] with Melony Overly, [name]  Counselor:Behavioral Health with Robley Fries, PHD  Allergic Rhinitis: Patient is taking Montelukast 10mg  daily. He reports medication is effective.  All other medications are managed by specialist.   Outpatient Encounter Medications as of 04/28/2023  Medication Sig   ALPRAZolam (XANAX) 0.5 MG tablet Take 0.5-1 tablets (0.25-0.5 mg total) by mouth 2 (two) times daily as needed for anxiety.   aspirin EC 81 MG tablet Take 81 mg by mouth daily. Swallow whole.   atorvastatin (LIPITOR) 80 MG tablet Take 1 tablet (80 mg total) by mouth daily.   divalproex (DEPAKOTE) 500 MG DR tablet Take 4 tablets (2,000 mg total) by mouth at bedtime.   escitalopram (LEXAPRO) 20 MG tablet Take 1 tablet (20 mg total) by mouth daily.   ezetimibe (ZETIA) 10 MG tablet Take 1 tablet (10 mg total) by mouth daily.   isosorbide mononitrate (IMDUR) 30 MG 24 hr tablet Take 0.5 tablets (15 mg total) by mouth daily.   lamoTRIgine (LAMICTAL) 100 MG tablet Take 1 tablet (100 mg total) by mouth 2 (two) times daily.   metoprolol succinate (TOPROL-XL) 25 MG 24 hr tablet TAKE 1 TABLET (25 MG) EVERY DAY. TAKE WITH OR IMMEDIATELY FOLLOWING A MEAL.   montelukast (SINGULAIR) 10 MG tablet TAKE 1 TABLET EVERY DAY   nitroGLYCERIN (NITROSTAT) 0.4 MG SL tablet  Place 1 tablet (0.4 mg total) under the tongue every 5 (five) minutes as needed for chest pain.   pantoprazole (PROTONIX) 40 MG tablet Take 1 tablet (40 mg total) by mouth daily.   QUEtiapine (SEROQUEL XR) 300 MG 24 hr tablet TAKE 1 TABLET AT BEDTIME (Patient taking differently: Take 150 mg by mouth at bedtime.)   No facility-administered encounter medications on file as of 04/28/2023.    Past Medical History:  Diagnosis Date   Anxiety    Bipolar 1 disorder ([name])    Bipolar disorder ([name]) 10/05/2009   Qualifier: Diagnosis of  By: Vikki Ports     BPH (benign prostatic hypertrophy)    CAD (coronary artery disease), native coronary artery    a. DES x 2 to RCA in 2004 b. 02/11/17 PCI w/DES x1 to mRCA   Coronary atherosclerosis 10/05/2009   Qualifier: Diagnosis of  By: Vikki Ports   ANGIOGRAPHIC DATA:  1. Left main coronary artery was free of critical disease.  2. The LAD coursed to the apex. There is mild luminal irregularity  including mild irregularity of the diagonal. No high grade areas of  stenosis were noted. There was faint collateralization of the distal  right coronary circulation.  3. The circumflex provided two major m   Depression    Dyspnea 09/20/2012   Essential and other specified forms of tremor 02/05/2013   HYPERCHOLESTEROLEMIA 10/05/2009   Qualifier: Diagnosis of  By: Vikki Ports     Hyperlipidemia    Memory loss 02/05/2013   Mitral regurgitation and aortic stenosis    MITRAL STENOSIS/ INSUFFICIENCY, NON-RHEUMATIC 01/06/2011   Qualifier: Diagnosis of  By: Riley Kill, MD, Johny Sax Study Conclusions  - Left ventricle: The cavity size was normal. Wall thickness was increased in a pattern of mild LVH. Systolic function was normal. The estimated ejection fraction was in the range of 55% to 60%. Wall motion was normal; there were no regional wall motion abnormalities. Left ventricular diastolic function parameters were   MVP (mitral valve prolapse)    Myocardial  infarction ([name]) May 27, 2011   Obstructive sleep apnea 02/05/2013   OSA (obstructive sleep apnea) 03/22/2013   Parasomnia 02/05/2013   Sleep apnea    Sleep disorder with cognitive complaints 10/26/2018   Subdural hematoma, post-traumatic ([name])    fell and hit his head on ice and urgent care and pcp but did not tell them he had fallen and hit his head.   Unspecified hereditary and idiopathic peripheral neuropathy 02/05/2013    Past Surgical History:  Procedure Laterality Date   COLONOSCOPY     CORONARY STENT INTERVENTION N/A 02/12/2017   Procedure: Coronary Stent Intervention;  Surgeon: Peter M Swaziland, MD;  Location: [facility] INVASIVE CV LAB;  Service: Cardiovascular;  Laterality: N/A;   CRANIOTOMY Left 01/28/2021   Procedure: CRANIOTOMY HEMATOMA EVACUATION SUBDURAL;  Surgeon: Tia Alert, MD;  Location: [facility] OR;  Service: Neurosurgery;  Laterality: Left;   LEFT HEART CATH AND CORONARY ANGIOGRAPHY N/A 02/12/2017   Procedure: Left Heart Cath and Coronary Angiography;  Surgeon: Laurey Morale, MD;  Location: [facility] INVASIVE CV LAB;  Service: Cardiovascular;  Laterality: N/A;   POLYPECTOMY      Family History  Problem Relation Age of Onset   Heart disease Mother    COPD Father    Colon polyps Sister    Colon cancer Sister    Heart disease Sister    Heart disease Sister    Heart disease Brother    Ovarian cancer Daughter    Esophageal cancer Neg Hx    Rectal cancer Neg Hx    Stomach cancer Neg Hx     Social History   Socioeconomic History   Marital status: Married    Spouse name: Derrick [name]   Number of children: 2   Years of education: Not on file   Highest education level: 12th grade  Occupational History   Not on file  Tobacco Use   Smoking status: Every Day    Packs/day: .1    Types: Cigarettes   Smokeless tobacco: Never   Tobacco comments:    4 cigs per day updated 09/19/2021  Vaping Use   Vaping Use: Never used  Substance and Sexual Activity   Alcohol use: Not Currently     Comment: Quit at age 49   Drug use: Never   Sexual activity: Not Currently  Other Topics Concern   Not on file  Social History Narrative   Oldest daughter died in 2015-05-27 from ovarian ca.   5 grandchildren   Social Determinants of Health   Financial Resource Strain: Low Risk  (10/28/2021)   Overall Financial Resource Strain (CARDIA)    Difficulty of Paying Living Expenses: Not hard at all  Recent Concern: Financial Resource Strain - Medium Risk (10/10/2021)   Overall Financial Resource Strain (CARDIA)    Difficulty of Paying Living Expenses: Somewhat hard  Food Insecurity: No Food Insecurity (04/28/2023)   Hunger  Vital Sign    Worried About Programme researcher, broadcasting/film/video in the Last Year: Never true    Ran Out of Food in the Last Year: Never true  Transportation Needs: No Transportation Needs (04/28/2023)   PRAPARE - Administrator, Civil Service (Medical): No    Lack of Transportation (Non-Medical): No  Physical Activity: Sufficiently Active (10/28/2021)   Exercise Vital Sign    Days of Exercise per Week: 5 days    Minutes of Exercise per Session: 30 min  Stress: Stress Concern Present (12/03/2021)   [employer] of Occupational Health - Occupational Stress Questionnaire    Feeling of Stress : Rather much  Social Connections: Socially Isolated (04/28/2023)   Social Connection and Isolation Panel [NHANES]    Frequency of Communication with Friends and Family: Twice a week    Frequency of Social Gatherings with Friends and Family: Never    Attends Religious Services: Never    Database administrator or Organizations: No    Attends Banker Meetings: Never    Marital Status: Married  Catering manager Violence: Not At Risk (10/28/2021)   Humiliation, Afraid, Rape, and Kick questionnaire    Fear of Current or Ex-Partner: No    Emotionally Abused: No    Physically Abused: No    Sexually Abused: No    ROS See HPI above    Objective   BP 104/74   Pulse 89    Temp 98.9 F (37.2 C)   Ht 5\' 10"  (1.778 m)   Wt 190 lb 6 oz (86.4 kg)   SpO2 93%   BMI 27.32 kg/m   Physical Exam Vitals reviewed.  Constitutional:      General: He is not in acute distress.    Appearance: Normal appearance. He is not ill-appearing, toxic-appearing or diaphoretic.  HENT:     Head: Normocephalic and atraumatic.  Eyes:     General:        Right eye: No discharge.        Left eye: No discharge.     Conjunctiva/sclera: Conjunctivae normal.  Cardiovascular:     Rate and Rhythm: Normal rate and regular rhythm.     Heart sounds: Normal heart sounds. No murmur heard.    No friction rub. No gallop.  Pulmonary:     Effort: Pulmonary effort is normal. No respiratory distress.     Breath sounds: Normal breath sounds.  Musculoskeletal:        General: Normal range of motion.  Skin:    General: Skin is warm and dry.  Neurological:     General: No focal deficit present.     Mental Status: He is alert and oriented to person, place, and time. Mental status is at baseline.  Psychiatric:        Mood and Affect: Mood normal.        Behavior: Behavior normal.        Thought Content: Thought content normal.        Judgment: Judgment normal.      Assessment & Plan:  Encounter to establish care  Chronic allergic rhinitis Assessment & Plan: Continue with Montelukast 10 mg daily. He denies needing a refill on medication.    Encounter for screening for HIV -     HIV Antibody (routine testing w rflx); Future  1.Review health maintenance: -Recommend to get your pneumonia vaccine at your local pharmacy.  -Declines covid booster.  -Needs AWV visit.  -Recommend to get your  second shingles vaccine at your local pharmacy.  -Will order a future lab for HIV screening.  2.Continue with all medications. Based on cardiology note from his previous labs, he need to speak with psychiatrist about changing the dose on Seroquel. Patient reports this is in regards to his abnormal  EKG. 3.He has future labs from cardiology (lipids and hepatic function) that can be drawn when he gets his HIV lab.   Return in about 1 month (around 05/29/2023) for physical; AWV visit through telehealh; lab visit when fasting. [name]   Zandra Abts, NP

## 2023-04-30 ENCOUNTER — Ambulatory Visit (INDEPENDENT_AMBULATORY_CARE_PROVIDER_SITE_OTHER): Payer: Medicare HMO | Admitting: *Deleted

## 2023-04-30 DIAGNOSIS — Z0000 Encounter for general adult medical examination without abnormal findings: Secondary | ICD-10-CM

## 2023-04-30 NOTE — Patient Instructions (Signed)
Mr. Derrick [name] , Thank you for taking time to come for your Medicare Wellness Visit. I appreciate your ongoing commitment to your health goals. Please review the following plan we discussed and let me know if I can assist you in the future.   Screening recommendations/referrals: Colonoscopy: up to date Recommended yearly ophthalmology/optometry visit for glaucoma screening and checkup Recommended yearly dental visit for hygiene and checkup  Vaccinations: Influenza vaccine: up to date Pneumococcal vaccine: Education provided Tdap vaccine: up to date Shingles vaccine: 1 of 2    Advanced directives: yes not on file     Preventive Care 65 Years and Older, Male Preventive care refers to lifestyle choices and visits with your health care provider that can promote health and wellness. What does preventive care include? A yearly physical exam. This is also called an annual well check. Dental exams once or twice a year. Routine eye exams. Ask your health care provider how often you should have your eyes checked. Personal lifestyle choices, including: Daily care of your teeth and gums. Regular physical activity. Eating a healthy diet. Avoiding tobacco and drug use. Limiting alcohol use. Practicing safe sex. Taking low doses of aspirin every day. Taking vitamin and mineral supplements as recommended by your health care provider. What happens during an annual well check? The services and screenings done by your health care provider during your annual well check will depend on your age, overall health, lifestyle risk factors, and family history of disease. Counseling  Your health care provider may ask you questions about your: Alcohol use. Tobacco use. Drug use. Emotional well-being. Home and relationship well-being. Sexual activity. Eating habits. History of falls. Memory and ability to understand (cognition). Work and work Astronomer. Screening  You may have the following tests or  measurements: Height, weight, and BMI. Blood pressure. Lipid and cholesterol levels. These may be checked every 5 years, or more frequently if you are over 73 years old. Skin check. Lung cancer screening. You may have this screening every year starting at age 78 if you have a 30-pack-year history of smoking and currently smoke or have quit within the past 15 years. Fecal occult blood test (FOBT) of the stool. You may have this test every year starting at age 40. Flexible sigmoidoscopy or colonoscopy. You may have a sigmoidoscopy every 5 years or a colonoscopy every 10 years starting at age 22. Prostate cancer screening. Recommendations will vary depending on your family history and other risks. Hepatitis C blood test. Hepatitis B blood test. Sexually transmitted disease (STD) testing. Diabetes screening. This is done by checking your blood sugar (glucose) after you have not eaten for a while (fasting). You may have this done every 1-3 years. Abdominal aortic aneurysm (AAA) screening. You may need this if you are a current or former smoker. Osteoporosis. You may be screened starting at age 34 if you are at high risk. Talk with your health care provider about your test results, treatment options, and if necessary, the need for more tests. Vaccines  Your health care provider may recommend certain vaccines, such as: Influenza vaccine. This is recommended every year. Tetanus, diphtheria, and acellular pertussis (Tdap, Td) vaccine. You may need a Td booster every 10 years. Zoster vaccine. You may need this after age 82. Pneumococcal 13-valent conjugate (PCV13) vaccine. One dose is recommended after age 83. Pneumococcal polysaccharide (PPSV23) vaccine. One dose is recommended after age 43. Talk to your health care provider about which screenings and vaccines you need and how often you  need them. This information is not intended to replace advice given to you by your health care provider. Make sure  you discuss any questions you have with your health care provider. Document Released: 12/28/2015 Document Revised: 08/20/2016 Document Reviewed: 10/02/2015 Elsevier Interactive Patient Education  2017 [employer].  Fall Prevention in the Home Falls can cause injuries. They can happen to people of all ages. There are many things you can do to make your home safe and to help prevent falls. What can I do on the outside of my home? Regularly fix the edges of walkways and driveways and fix any cracks. Remove anything that might make you trip as you walk through a door, such as a raised step or threshold. Trim any bushes or trees on the path to your home. Use bright outdoor lighting. Clear any walking paths of anything that might make someone trip, such as rocks or tools. Regularly check to see if handrails are loose or broken. Make sure that both sides of any steps have handrails. Any raised decks and porches should have guardrails on the edges. Have any leaves, snow, or ice cleared regularly. Use sand or salt on walking paths during winter. Clean up any spills in your garage right away. This includes oil or grease spills. What can I do in the bathroom? Use night lights. Install grab bars by the toilet and in the tub and shower. Do not use towel bars as grab bars. Use non-skid mats or decals in the tub or shower. If you need to sit down in the shower, use a plastic, non-slip stool. Keep the floor dry. Clean up any water that spills on the floor as soon as it happens. Remove soap buildup in the tub or shower regularly. Attach bath mats securely with double-sided non-slip rug tape. Do not have throw rugs and other things on the floor that can make you trip. What can I do in the bedroom? Use night lights. Make sure that you have a light by your bed that is easy to reach. Do not use any sheets or blankets that are too big for your bed. They should not hang down onto the floor. Have a firm  chair that has side arms. You can use this for support while you get dressed. Do not have throw rugs and other things on the floor that can make you trip. What can I do in the kitchen? Clean up any spills right away. Avoid walking on wet floors. Keep items that you use a lot in easy-to-reach places. If you need to reach something above you, use a strong step stool that has a grab bar. Keep electrical cords out of the way. Do not use floor polish or wax that makes floors slippery. If you must use wax, use non-skid floor wax. Do not have throw rugs and other things on the floor that can make you trip. What can I do with my stairs? Do not leave any items on the stairs. Make sure that there are handrails on both sides of the stairs and use them. Fix handrails that are broken or loose. Make sure that handrails are as long as the stairways. Check any carpeting to make sure that it is firmly attached to the stairs. Fix any carpet that is loose or worn. Avoid having throw rugs at the top or bottom of the stairs. If you do have throw rugs, attach them to the floor with carpet tape. Make sure that you have a light switch  at the top of the stairs and the bottom of the stairs. If you do not have them, ask someone to add them for you. What else can I do to help prevent falls? Wear shoes that: Do not have high heels. Have rubber bottoms. Are comfortable and fit you well. Are closed at the toe. Do not wear sandals. If you use a stepladder: Make sure that it is fully opened. Do not climb a closed stepladder. Make sure that both sides of the stepladder are locked into place. Ask someone to hold it for you, if possible. Clearly mark and make sure that you can see: Any grab bars or handrails. First and last steps. Where the edge of each step is. Use tools that help you move around (mobility aids) if they are needed. These include: Canes. Walkers. Scooters. Crutches. Turn on the lights when you go  into a dark area. Replace any light bulbs as soon as they burn out. Set up your furniture so you have a clear path. Avoid moving your furniture around. If any of your floors are uneven, fix them. If there are any pets around you, be aware of where they are. Review your medicines with your doctor. Some medicines can make you feel dizzy. This can increase your chance of falling. Ask your doctor what other things that you can do to help prevent falls. This information is not intended to replace advice given to you by your health care provider. Make sure you discuss any questions you have with your health care provider. Document Released: 09/27/2009 Document Revised: 05/08/2016 Document Reviewed: 01/05/2015 Elsevier Interactive Patient Education  2017 [employer].

## 2023-04-30 NOTE — Progress Notes (Signed)
Subjective:   Derrick [name] is a 67 y.o. male who presents for Medicare Annual/Subsequent preventive examination.  I connected with  Edrick Kins on 04/30/23 by a telephone enabled telemedicine application and verified that I am speaking with the correct person using two identifiers.   I discussed the limitations of evaluation and management by telemedicine. The patient expressed understanding and agreed to proceed.  Patient location: home  Provider location: telephone/home    Review of Systems     Cardiac Risk Factors include: advanced age (>22men, >83 women);hypertension;male gender;family history of premature cardiovascular disease;obesity (BMI >30kg/m2)     Objective:    Today's Vitals   There is no height or weight on file to calculate BMI.     04/30/2023    3:04 PM 10/28/2021    2:07 PM 01/29/2021   11:00 AM 02/12/2017    6:41 AM  Advanced Directives  Does Patient Have a Medical Advance Directive? Yes No  No  Type of Advance Directive Healthcare Power of Attorney     Does patient want to make changes to medical advance directive?  No - Patient declined    Copy of Healthcare Power of Attorney in Chart? No - copy requested     Would patient like information on creating a medical advance directive?  No - Patient declined Yes (Inpatient - patient defers creating a medical advance directive at this time - Information given) No - Patient declined    Current Medications (verified) Outpatient Encounter Medications as of 04/30/2023  Medication Sig   ALPRAZolam (XANAX) 0.5 MG tablet Take 0.5-1 tablets (0.25-0.5 mg total) by mouth 2 (two) times daily as needed for anxiety.   aspirin EC 81 MG tablet Take 81 mg by mouth daily. Swallow whole.   atorvastatin (LIPITOR) 80 MG tablet Take 1 tablet (80 mg total) by mouth daily.   divalproex (DEPAKOTE) 500 MG DR tablet Take 4 tablets (2,000 mg total) by mouth at bedtime.   escitalopram (LEXAPRO) 20 MG tablet Take 1 tablet (20 mg  total) by mouth daily.   ezetimibe (ZETIA) 10 MG tablet Take 1 tablet (10 mg total) by mouth daily.   isosorbide mononitrate (IMDUR) 30 MG 24 hr tablet Take 0.5 tablets (15 mg total) by mouth daily.   lamoTRIgine (LAMICTAL) 100 MG tablet Take 1 tablet (100 mg total) by mouth 2 (two) times daily.   metoprolol succinate (TOPROL-XL) 25 MG 24 hr tablet TAKE 1 TABLET (25 MG) EVERY DAY. TAKE WITH OR IMMEDIATELY FOLLOWING A MEAL.   montelukast (SINGULAIR) 10 MG tablet TAKE 1 TABLET EVERY DAY   nitroGLYCERIN (NITROSTAT) 0.4 MG SL tablet Place 1 tablet (0.4 mg total) under the tongue every 5 (five) minutes as needed for chest pain.   pantoprazole (PROTONIX) 40 MG tablet Take 1 tablet (40 mg total) by mouth daily.   QUEtiapine (SEROQUEL XR) 300 MG 24 hr tablet TAKE 1 TABLET AT BEDTIME (Patient taking differently: Take 150 mg by mouth at bedtime.)   No facility-administered encounter medications on file as of 04/30/2023.    Allergies (verified) Patient has no known allergies.   History: Past Medical History:  Diagnosis Date   Anxiety    Bipolar 1 disorder ([name])    Bipolar disorder ([name]) 10/05/2009   Qualifier: Diagnosis of  By: Vikki Ports     BPH (benign prostatic hypertrophy)    CAD (coronary artery disease), native coronary artery    a. DES x 2 to RCA in 2004 b. 02/11/17 PCI w/DES  x1 to mRCA   Coronary atherosclerosis 10/05/2009   Qualifier: Diagnosis of  By: Vikki Ports   ANGIOGRAPHIC DATA:  1. Left main coronary artery was free of critical disease.  2. The LAD coursed to the apex. There is mild luminal irregularity  including mild irregularity of the diagonal. No high grade areas of  stenosis were noted. There was faint collateralization of the distal  right coronary circulation.  3. The circumflex provided two major m   Depression    Dyspnea 09/20/2012   Essential and other specified forms of tremor 02/05/2013   HYPERCHOLESTEROLEMIA 10/05/2009   Qualifier: Diagnosis of  By: Vikki Ports     Hyperlipidemia    Memory loss 02/05/2013   Mitral regurgitation and aortic stenosis    MITRAL STENOSIS/ INSUFFICIENCY, NON-RHEUMATIC 01/06/2011   Qualifier: Diagnosis of  By: Riley Kill, MD, Johny Sax Study Conclusions  - Left ventricle: The cavity size was normal. Wall thickness was increased in a pattern of mild LVH. Systolic function was normal. The estimated ejection fraction was in the range of 55% to 60%. Wall motion was normal; there were no regional wall motion abnormalities. Left ventricular diastolic function parameters were   MVP (mitral valve prolapse)    Myocardial infarction ([name]) 2012   Obstructive sleep apnea 02/05/2013   OSA (obstructive sleep apnea) 03/22/2013   Parasomnia 02/05/2013   Sleep apnea    Sleep disorder with cognitive complaints 10/26/2018   Subdural hematoma, post-traumatic ([name])    fell and hit his head on ice and urgent care and pcp but did not tell them he had fallen and hit his head.   Unspecified hereditary and idiopathic peripheral neuropathy 02/05/2013   Past Surgical History:  Procedure Laterality Date   COLONOSCOPY     CORONARY STENT INTERVENTION N/A 02/12/2017   Procedure: Coronary Stent Intervention;  Surgeon: Peter M Swaziland, MD;  Location: [facility] INVASIVE CV LAB;  Service: Cardiovascular;  Laterality: N/A;   CRANIOTOMY Left 01/28/2021   Procedure: CRANIOTOMY HEMATOMA EVACUATION SUBDURAL;  Surgeon: Tia Alert, MD;  Location: [facility] OR;  Service: Neurosurgery;  Laterality: Left;   LEFT HEART CATH AND CORONARY ANGIOGRAPHY N/A 02/12/2017   Procedure: Left Heart Cath and Coronary Angiography;  Surgeon: Laurey Morale, MD;  Location: [facility] INVASIVE CV LAB;  Service: Cardiovascular;  Laterality: N/A;   POLYPECTOMY     Family History  Problem Relation Age of Onset   Heart disease Mother    COPD Father    Colon polyps Sister    Colon cancer Sister    Heart disease Sister    Heart disease Sister    Heart disease Brother    Ovarian cancer  Daughter    Esophageal cancer Neg Hx    Rectal cancer Neg Hx    Stomach cancer Neg Hx    Social History   Socioeconomic History   Marital status: Married    Spouse name: [name]   Number of children: 2   Years of education: Not on file   Highest education level: 12th grade  Occupational History   Not on file  Tobacco Use   Smoking status: Every Day    Packs/day: .1    Types: Cigarettes   Smokeless tobacco: Never   Tobacco comments:    4 cigs per day updated 09/19/2021  Vaping Use   Vaping Use: Never used  Substance and Sexual Activity   Alcohol use: Not Currently    Comment: Quit at age 74   Drug use:  Never   Sexual activity: Not Currently  Other Topics Concern   Not on file  Social History Narrative   Oldest daughter died in Jun 01, 2015 from ovarian ca.   5 grandchildren   Social Determinants of Health   Financial Resource Strain: Low Risk  (04/30/2023)   Overall Financial Resource Strain (CARDIA)    Difficulty of Paying Living Expenses: Not hard at all  Food Insecurity: No Food Insecurity (04/30/2023)   Hunger Vital Sign    Worried About Running Out of Food in the Last Year: Never true    Ran Out of Food in the Last Year: Never true  Transportation Needs: No Transportation Needs (04/28/2023)   PRAPARE - Administrator, Civil Service (Medical): No    Lack of Transportation (Non-Medical): No  Physical Activity: Inactive (04/30/2023)   Exercise Vital Sign    Days of Exercise per Week: 0 days    Minutes of Exercise per Session: 0 min  Stress: Stress Concern Present (04/30/2023)   [employer] of Occupational Health - Occupational Stress Questionnaire    Feeling of Stress : Rather much  Social Connections: Moderately Isolated (04/30/2023)   Social Connection and Isolation Panel [NHANES]    Frequency of Communication with Friends and Family: More than three times a week    Frequency of Social Gatherings with Friends and Family: Never    Attends Religious  Services: Never    Database administrator or Organizations: No    Attends Engineer, structural: Never    Marital Status: Married    Tobacco Counseling Ready to quit: Not Answered Counseling given: Not Answered Tobacco comments: 4 cigs per day updated 09/19/2021   Clinical Intake:  Pre-visit preparation completed: Yes  Pain : No/denies pain     Diabetes: No  How often do you need to have someone help you when you read instructions, pamphlets, or other written materials from your doctor or pharmacy?: 1 - Never  Diabetic?  no  Interpreter Needed?: No  Information entered by :: Remi Haggard LPN   Activities of Daily Living    04/30/2023    3:05 PM  In your present state of health, do you have any difficulty performing the following activities:  Hearing? 0  Vision? 0  Difficulty concentrating or making decisions? 1  Walking or climbing stairs? 0  Dressing or bathing? 0  Doing errands, shopping? 0  Preparing Food and eating ? N  Using the Toilet? N  In the past six months, have you accidently leaked urine? N  Do you have problems with loss of bowel control? N  Managing your Medications? N  Managing your Finances? N  Housekeeping or managing your Housekeeping? N    Patient Care Team: Gabriel Earing, [name] as PCP - General (Family Medicine) Louanne Skye Devoria Albe., NP as PCP - Cardiology (Nurse Practitioner) Randa Spike Kelton Pillar, LCSW as [facility] Care Management (Licensed Clinical Social Worker) Claybon Jabs, Vela Prose as Physician Assistant (Psychiatry) Robley Fries, PhD (Psychiatry)  Indicate any recent Medical Services you may have received from other than [facility] providers in the past year (date may be approximate).     Assessment:   This is a routine wellness examination for Lorrie.  Hearing/Vision screen Hearing Screening - Comments:: No hearing aids Vision Screening - Comments:: Up to date My eye doctor  Dietary issues and exercise  activities discussed: Current Exercise Habits: The patient does not participate in regular exercise at present   Goals  Addressed             This Visit's Progress    Patient Stated       Would like to be able to increase memory Keep being able to keep up with house      Depression Screen    04/30/2023    3:09 PM 04/28/2023    1:33 PM 09/03/2022   11:27 AM 09/03/2022   11:23 AM 01/10/2022    9:34 AM 12/03/2021    5:15 PM 10/28/2021    2:01 PM  PHQ 2/9 Scores  PHQ - 2 Score 3 4 1  0 6 2 1   PHQ- 9 Score 13 20 9  14 10      Fall Risk    04/30/2023    3:02 PM 04/28/2023    1:32 PM 09/03/2022   11:23 AM 01/10/2022    9:34 AM 10/28/2021    2:08 PM  Fall Risk   Falls in the past year? 0 0 0 1 1  Number falls in past yr: 0 0  1 0  Injury with Fall? 0 0   1  Risk for fall due to :  History of fall(s)   History of fall(s);Impaired vision;Impaired balance/gait  Follow up Falls evaluation completed;Education provided;Falls prevention discussed Falls evaluation completed   Falls prevention discussed    FALL RISK PREVENTION PERTAINING TO THE HOME:  Any stairs in or around the home? Yes  If so, are there any without handrails? Yes  Home free of loose throw rugs in walkways, pet beds, electrical cords, etc? Yes  Adequate lighting in your home to reduce risk of falls? Yes   ASSISTIVE DEVICES UTILIZED TO PREVENT FALLS:  Life alert? No  Use of a cane, walker or w/c? No  Grab bars in the bathroom? Yes  Shower chair or bench in shower? Yes  Elevated toilet seat or a handicapped toilet? No   TIMED UP AND GO:  Was the test performed? No .    Cognitive Function:        04/30/2023    3:06 PM 10/28/2021    2:12 PM  6CIT Screen  What Year? 0 points 0 points  What month? 0 points 0 points  What time? 0 points 0 points  Count back from 20 0 points 0 points  Months in reverse 0 points 0 points  Repeat phrase 4 points 0 points  Total Score 4 points 0 points     Immunizations Immunization History  Administered Date(s) Administered   Influenza,inj,Quad PF,6+ Mos 12/12/2013, 10/03/2014, 10/16/2015, 10/16/2016, 10/28/2017, 09/20/2018, 09/20/2019, 10/23/2020, 10/23/2020, 09/26/2021, 09/03/2022   Influenza,inj,quad, With Preservative 10/15/2020   Janssen (J&J) SARS-COV-2 Vaccination 04/12/2020   Moderna Sars-Covid-2 Vaccination 12/15/2020   Tdap 05/27/2017   Zoster Recombinat (Shingrix) 09/03/2022    TDAP status: Up to date  Flu Vaccine status: Up to date  Pneumococcal vaccine status: Due, Education has been provided regarding the importance of this vaccine. Advised may receive this vaccine at local pharmacy or Health Dept. Aware to provide a copy of the vaccination record if obtained from local pharmacy or Health Dept. Verbalized acceptance and understanding.  Covid-19 vaccine status: Information provided on how to obtain vaccines.   Qualifies for Shingles Vaccine? Yes   Zostavax completed No   Shingrix Completed?: No.    Education has been provided regarding the importance of this vaccine. Patient has been advised to call insurance company to determine out of pocket expense if they have not yet received this vaccine.  Advised may also receive vaccine at local pharmacy or Health Dept. Verbalized acceptance and understanding.  Screening Tests Health Maintenance  Topic Date Due   Pneumonia Vaccine 82+ Years old (1 of 2 - PCV) Never done   Zoster Vaccines- Shingrix (2 of 2) 10/29/2022   COVID-19 Vaccine (3 - 2023-24 season) 05/16/2023 (Originally 08/15/2022)   HIV Screening  09/04/2023 (Originally 01/07/1971)   INFLUENZA VACCINE  07/16/2023   Medicare Annual Wellness (AWV)  04/29/2024   COLONOSCOPY (Pts 45-9yrs Insurance coverage will need to be confirmed)  09/20/2024   DTaP/Tdap/Td (2 - Td or Tdap) 05/28/2027   Hepatitis C Screening  Completed   HPV VACCINES  Aged Out    Health Maintenance  Health Maintenance Due  Topic Date Due    Pneumonia Vaccine 3+ Years old (1 of 2 - PCV) Never done   Zoster Vaccines- Shingrix (2 of 2) 10/29/2022    Colorectal cancer screening: Type of screening: Colonoscopy. Completed 2022. Repeat every 3 years  Lung Cancer Screening: (Low Dose CT Chest recommended if Age 1-80 years, 30 pack-year currently smoking OR have quit w/in 15years.) does not qualify.   Lung Cancer Screening Referral:   Additional Screening:  Hepatitis C Screening: does not qualify; Completed 2017  Vision Screening: Recommended annual ophthalmology exams for early detection of glaucoma and other disorders of the eye. Is the patient up to date with their annual eye exam?  Yes  Who is the provider or what is the name of the office in which the patient attends annual eye exams? My eye doctor If pt is not established with a provider, would they like to be referred to a provider to establish care? No .   Dental Screening: Recommended annual dental exams for proper oral hygiene  Community Resource Referral / Chronic Care Management: CRR required this visit?  No   CCM required this visit?  No      Plan:     I have personally reviewed and noted the following in the patient's chart:   Medical and social history Use of alcohol, tobacco or illicit drugs  Current medications and supplements including opioid prescriptions. Patient is not currently taking opioid prescriptions. Functional ability and status Nutritional status Physical activity Advanced directives List of other physicians Hospitalizations, surgeries, and ER visits in previous 12 months Vitals Screenings to include cognitive, depression, and falls Referrals and appointments  In addition, I have reviewed and discussed with patient certain preventive protocols, quality metrics, and best practice recommendations. A written personalized care plan for preventive services as well as general preventive health recommendations were provided to patient.      Remi Haggard, LPN   1/61/0960   Nurse Notes:

## 2023-05-01 ENCOUNTER — Other Ambulatory Visit: Payer: Medicare HMO

## 2023-05-01 DIAGNOSIS — I341 Nonrheumatic mitral (valve) prolapse: Secondary | ICD-10-CM | POA: Diagnosis not present

## 2023-05-01 DIAGNOSIS — I25118 Atherosclerotic heart disease of native coronary artery with other forms of angina pectoris: Secondary | ICD-10-CM | POA: Diagnosis not present

## 2023-05-01 DIAGNOSIS — F17200 Nicotine dependence, unspecified, uncomplicated: Secondary | ICD-10-CM

## 2023-05-01 DIAGNOSIS — I10 Essential (primary) hypertension: Secondary | ICD-10-CM

## 2023-05-01 DIAGNOSIS — Z114 Encounter for screening for human immunodeficiency virus [HIV]: Secondary | ICD-10-CM | POA: Diagnosis not present

## 2023-05-01 DIAGNOSIS — E7800 Pure hypercholesterolemia, unspecified: Secondary | ICD-10-CM | POA: Diagnosis not present

## 2023-05-02 LAB — HIV ANTIBODY (ROUTINE TESTING W REFLEX): HIV 1&2 Ab, 4th Generation: NONREACTIVE

## 2023-05-03 LAB — LIPID PANEL
Chol/HDL Ratio: 5.3 ratio — ABNORMAL HIGH (ref 0.0–5.0)
Cholesterol, Total: 175 mg/dL (ref 100–199)
HDL: 33 mg/dL — ABNORMAL LOW (ref >39)
LDL Chol Calc (NIH): 94 mg/dL (ref 0–99)
Triglycerides: 282 mg/dL — ABNORMAL HIGH (ref 0–149)
VLDL Cholesterol Cal: 48 mg/dL — ABNORMAL HIGH (ref 5–40)

## 2023-05-03 LAB — HEPATIC FUNCTION PANEL
ALT: 22 IU/L (ref 0–44)
AST: 20 IU/L (ref 0–40)
Albumin: 4.3 g/dL (ref 3.9–4.9)
Alkaline Phosphatase: 64 IU/L (ref 44–121)
Bilirubin Total: 0.2 mg/dL (ref 0.0–1.2)
Bilirubin, Direct: <0.1 mg/dL (ref 0.00–0.40)
Total Protein: 6.2 g/dL (ref 6.0–8.5)

## 2023-05-04 ENCOUNTER — Telehealth: Payer: Self-pay

## 2023-05-04 NOTE — Telephone Encounter (Signed)
-----   Message from Alveria Apley, NP sent at 05/03/2023  5:36 PM EDT ----- HIV is non reactive.

## 2023-05-07 ENCOUNTER — Telehealth: Payer: Self-pay | Admitting: Nurse Practitioner

## 2023-05-07 NOTE — Telephone Encounter (Signed)
Pt called in for lab results  °

## 2023-05-07 NOTE — Telephone Encounter (Signed)
Called patient x3 busy signal unable to leave voicemail

## 2023-05-12 ENCOUNTER — Telehealth: Payer: Self-pay | Admitting: Cardiology

## 2023-05-12 MED ORDER — FENOFIBRATE 48 MG PO TABS: 48.0000 mg | ORAL_TABLET | Freq: Every day | ORAL | 3 refills | Status: DC

## 2023-05-12 NOTE — Telephone Encounter (Signed)
Left message for patient to call back.    Gaston Islam., NP 05/03/2023 11:10 AM EDT     Please let patient know that his cholesterol numbers have improved but are still not at goal.  Your triglycerides are extremely elevated and your HDL (good cholesterol) is currently not at goal.  Your liver functions are currently all normal.   Plan: -We will add fenofibrate 48 mg daily and recheck cholesterol and liver function in 8 weeks. -Please reduce your consumption of sugars and refined carbohydrates such as cakes, cookies, or donuts. -Please increase your physical activity to at least 30 to 45 minutes a day of moderate aerobic activity such as walking. -Please increase your consumption of whole grains and foods rich in omega-3's such as salmon, tuna, sardines

## 2023-05-12 NOTE — Telephone Encounter (Signed)
Spoke with patient to review lab results and recommendations from Robin Searing, NP. Patient requested more information to be sent regarding diet via [employer]. Advised patient I would send him a copy of Ernest's note and diet information. Patient verbalized understanding and had no questions.

## 2023-05-12 NOTE — Telephone Encounter (Signed)
Patient is returning call about lab results  

## 2023-05-19 NOTE — Progress Notes (Unsigned)
Complete physical exam  Patient: Derrick [name]   DOB: [birth year]   67 y.o. Male  MRN: [id number]  Subjective:    No chief complaint on file.   Derrick [name] is a 67 y.o. male who presents today for a complete physical exam. He reports consuming a {diet types:17450} diet. {types:19826} He generally feels {DESC; WELL/FAIRLY WELL/POORLY:18703}. He reports sleeping {DESC; WELL/FAIRLY WELL/POORLY:18703}. He {does/does not:200015} have additional problems to discuss today.    Most recent fall risk assessment:    04/30/2023    3:02 PM  Fall Risk   Falls in the past year? 0  Number falls in past yr: 0  Injury with Fall? 0  Follow up Falls evaluation completed;Education provided;Falls prevention discussed     Most recent depression screenings:    04/30/2023    3:09 PM 04/28/2023    1:33 PM  PHQ 2/9 Scores  PHQ - 2 Score 3 4  PHQ- 9 Score 13 20    {VISON DENTAL STD PSA (Optional):27386}  {History (Optional):23778}  Patient Care Team: Gabriel Earing, [name] as PCP - General (Family Medicine) Louanne Skye Devoria Albe., NP as PCP - Cardiology (Nurse Practitioner) Randa Spike Kelton Pillar, LCSW as [facility] Care Management (Licensed Clinical Social Worker) Claybon Jabs, Vela Prose as Physician Assistant (Psychiatry) Robley Fries, PhD (Psychiatry)   Outpatient Medications Prior to Visit  Medication Sig   ALPRAZolam (XANAX) 0.5 MG tablet Take 0.5-1 tablets (0.25-0.5 mg total) by mouth 2 (two) times daily as needed for anxiety.   aspirin EC 81 MG tablet Take 81 mg by mouth daily. Swallow whole.   atorvastatin (LIPITOR) 80 MG tablet Take 1 tablet (80 mg total) by mouth daily.   divalproex (DEPAKOTE) 500 MG DR tablet Take 4 tablets (2,000 mg total) by mouth at bedtime.   escitalopram (LEXAPRO) 20 MG tablet Take 1 tablet (20 mg total) by mouth daily.   ezetimibe (ZETIA) 10 MG tablet Take 1 tablet (10 mg total) by mouth daily.   fenofibrate (TRICOR) 48 MG tablet Take 1 tablet (48 mg  total) by mouth daily.   isosorbide mononitrate (IMDUR) 30 MG 24 hr tablet Take 0.5 tablets (15 mg total) by mouth daily.   lamoTRIgine (LAMICTAL) 100 MG tablet Take 1 tablet (100 mg total) by mouth 2 (two) times daily.   metoprolol succinate (TOPROL-XL) 25 MG 24 hr tablet TAKE 1 TABLET (25 MG) EVERY DAY. TAKE WITH OR IMMEDIATELY FOLLOWING A MEAL.   montelukast (SINGULAIR) 10 MG tablet TAKE 1 TABLET EVERY DAY   nitroGLYCERIN (NITROSTAT) 0.4 MG SL tablet Place 1 tablet (0.4 mg total) under the tongue every 5 (five) minutes as needed for chest pain.   pantoprazole (PROTONIX) 40 MG tablet Take 1 tablet (40 mg total) by mouth daily.   QUEtiapine (SEROQUEL XR) 300 MG 24 hr tablet TAKE 1 TABLET AT BEDTIME (Patient taking differently: Take 150 mg by mouth at bedtime.)   No facility-administered medications prior to visit.    ROS See HPI above       Objective:     There were no vitals taken for this visit. {Vitals History (Optional):23777}  Physical Exam   No results found for any visits on 05/20/23. {Show previous labs (optional):23779}    Assessment & Plan:    Routine Health Maintenance and Physical Exam  Immunization History  Administered Date(s) Administered   Influenza,inj,Quad PF,6+ Mos 12/12/2013, 10/03/2014, 10/16/2015, 10/16/2016, 10/28/2017, 09/20/2018, 09/20/2019, 10/23/2020, 10/23/2020, 09/26/2021, 09/03/2022   Influenza,inj,quad, With Preservative 10/15/2020  Janssen (J&J) SARS-COV-2 Vaccination 04/12/2020   Moderna Sars-Covid-2 Vaccination 12/15/2020   Tdap 05/27/2017   Zoster Recombinat (Shingrix) 09/03/2022    Health Maintenance  Topic Date Due   Pneumonia Vaccine 77+ Years old (1 of 2 - PCV) Never done   COVID-19 Vaccine (3 - 2023-24 season) 08/15/2022   Zoster Vaccines- Shingrix (2 of 2) 10/29/2022   INFLUENZA VACCINE  07/16/2023   Medicare Annual Wellness (AWV)  04/29/2024   Colonoscopy  09/20/2024   DTaP/Tdap/Td (2 - Td or Tdap) 05/28/2027   Hepatitis  C Screening  Completed   HIV Screening  Completed   HPV VACCINES  Aged Out    Discussed health benefits of physical activity, and encouraged him to engage in regular exercise appropriate for his age and condition.  Annual physical exam    No follow-ups on file.     Zandra Abts, NP

## 2023-05-20 ENCOUNTER — Encounter: Payer: Self-pay | Admitting: Family Medicine

## 2023-05-20 ENCOUNTER — Ambulatory Visit (INDEPENDENT_AMBULATORY_CARE_PROVIDER_SITE_OTHER): Payer: Medicare HMO | Admitting: Family Medicine

## 2023-05-20 VITALS — BP 128/76 | HR 88 | Temp 98.3°F | Ht 70.0 in | Wt 189.2 lb

## 2023-05-20 DIAGNOSIS — Z125 Encounter for screening for malignant neoplasm of prostate: Secondary | ICD-10-CM

## 2023-05-20 DIAGNOSIS — Z0000 Encounter for general adult medical examination without abnormal findings: Secondary | ICD-10-CM

## 2023-05-20 DIAGNOSIS — Z0001 Encounter for general adult medical examination with abnormal findings: Secondary | ICD-10-CM

## 2023-05-20 DIAGNOSIS — H6121 Impacted cerumen, right ear: Secondary | ICD-10-CM

## 2023-05-20 LAB — PSA: PSA: 1.34 ng/mL (ref 0.10–4.00)

## 2023-05-20 NOTE — Patient Instructions (Addendum)
-  Physical exam completed today.  -Recommend to start walking at least 3 times a week for 30 minutes.Then, can gradually work up to daily walking.  -Recommend to obtain shingles and pneumonia vaccine at your local pharmacy. -Right ear lavage completed today. You may use over the counter Debrox at home if needed for ear wax impaction and decreased hearing.  -Offered a dermatology referral due to some moles on her skin. Declined, but if you change your mind, please call back or send a message through MyChart. I will send a referral to dermatology. -Follow up in 1 year. Notify office when you need a refill of Singular.

## 2023-05-22 ENCOUNTER — Telehealth: Payer: Self-pay

## 2023-05-22 ENCOUNTER — Other Ambulatory Visit: Payer: Self-pay

## 2023-05-22 MED ORDER — ATORVASTATIN CALCIUM 80 MG PO TABS: 80.0000 mg | ORAL_TABLET | Freq: Every day | ORAL | 3 refills | Status: DC

## 2023-05-22 NOTE — Progress Notes (Signed)
Left vm to call office about labs. 

## 2023-05-22 NOTE — Telephone Encounter (Signed)
-----   Message from Alveria Apley, NP sent at 05/22/2023  8:39 AM EDT ----- PSA is normal.

## 2023-05-25 NOTE — Telephone Encounter (Signed)
Pts wife states he has reviewed this on my chart.

## 2023-05-29 ENCOUNTER — Encounter: Payer: Medicare HMO | Admitting: Family Medicine

## 2023-06-11 ENCOUNTER — Other Ambulatory Visit: Payer: Self-pay | Admitting: Family Medicine

## 2023-06-11 DIAGNOSIS — J309 Allergic rhinitis, unspecified: Secondary | ICD-10-CM

## 2023-07-06 ENCOUNTER — Ambulatory Visit: Payer: Medicare HMO | Admitting: Psychiatry

## 2023-07-06 DIAGNOSIS — Z630 Problems in relationship with spouse or partner: Secondary | ICD-10-CM

## 2023-07-06 DIAGNOSIS — F600 Paranoid personality disorder: Secondary | ICD-10-CM | POA: Diagnosis not present

## 2023-07-06 DIAGNOSIS — F068 Other specified mental disorders due to known physiological condition: Secondary | ICD-10-CM

## 2023-07-06 DIAGNOSIS — F1991 Other psychoactive substance use, unspecified, in remission: Secondary | ICD-10-CM

## 2023-07-06 DIAGNOSIS — F319 Bipolar disorder, unspecified: Secondary | ICD-10-CM | POA: Diagnosis not present

## 2023-07-06 DIAGNOSIS — Z9889 Other specified postprocedural states: Secondary | ICD-10-CM

## 2023-07-06 DIAGNOSIS — F3132 Bipolar disorder, current episode depressed, moderate: Secondary | ICD-10-CM | POA: Diagnosis not present

## 2023-07-06 DIAGNOSIS — F411 Generalized anxiety disorder: Secondary | ICD-10-CM
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
## 2023-07-16 ENCOUNTER — Ambulatory Visit: Payer: Medicare HMO | Admitting: Physician Assistant

## 2023-08-05 DIAGNOSIS — H52223 Regular astigmatism, bilateral: Secondary | ICD-10-CM | POA: Diagnosis not present

## 2023-08-05 DIAGNOSIS — H5213 Myopia, bilateral: Secondary | ICD-10-CM | POA: Diagnosis not present

## 2023-08-05 DIAGNOSIS — H524 Presbyopia: Secondary | ICD-10-CM | POA: Diagnosis not present

## 2023-08-05 DIAGNOSIS — H2513 Age-related nuclear cataract, bilateral: Secondary | ICD-10-CM | POA: Diagnosis not present

## 2023-08-13 ENCOUNTER — Ambulatory Visit: Payer: Medicare HMO | Admitting: Physician Assistant

## 2023-08-14 ENCOUNTER — Ambulatory Visit (INDEPENDENT_AMBULATORY_CARE_PROVIDER_SITE_OTHER): Payer: Medicare HMO | Admitting: Physician Assistant

## 2023-08-14 ENCOUNTER — Encounter: Payer: Self-pay | Admitting: Physician Assistant

## 2023-08-14 DIAGNOSIS — R454 Irritability and anger: Secondary | ICD-10-CM

## 2023-08-14 DIAGNOSIS — Z79899 Other long term (current) drug therapy: Secondary | ICD-10-CM

## 2023-08-14 DIAGNOSIS — F411 Generalized anxiety disorder: Secondary | ICD-10-CM | POA: Diagnosis not present

## 2023-08-14 DIAGNOSIS — Z630 Problems in relationship with spouse or partner: Secondary | ICD-10-CM

## 2023-08-14 DIAGNOSIS — F319 Bipolar disorder, unspecified: Secondary | ICD-10-CM

## 2023-08-14 MED ORDER — LAMOTRIGINE 100 MG PO TABS: 100.0000 mg | ORAL_TABLET | Freq: Two times a day (BID) | ORAL | 3 refills | Status: DC

## 2023-08-14 MED ORDER — DIVALPROEX SODIUM 500 MG PO DR TAB: 2000.0000 mg | DELAYED_RELEASE_TABLET | Freq: Every day | ORAL | 3 refills | Status: DC

## 2023-08-14 MED ORDER — QUETIAPINE FUMARATE ER 300 MG PO TB24: 300.0000 mg | ORAL_TABLET | Freq: Every day | ORAL | 3 refills | Status: DC

## 2023-08-14 MED ORDER — ESCITALOPRAM OXALATE 20 MG PO TABS: 20.0000 mg | ORAL_TABLET | Freq: Every day | ORAL | 3 refills | Status: DC

## 2023-08-14 MED ORDER — ALPRAZOLAM 0.5 MG PO TABS: 0.2500 mg | ORAL_TABLET | Freq: Two times a day (BID) | ORAL | 0 refills | Status: DC | PRN
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
## 2023-08-20 ENCOUNTER — Telehealth: Payer: Self-pay | Admitting: Physician Assistant

## 2023-08-20 NOTE — Telephone Encounter (Signed)
Derrick [name] called at 1:11 to report that [facility] will not fill his Xanax prescription because of other medications he takes.  Please call pharmacy to approve them to fill hs Xanax prescription.[id number] [place], [place] - [address]

## 2023-08-21 NOTE — Telephone Encounter (Signed)
LVM to RC.  Insurance will not cover the medication, but there is no other issue per pharmacy. Cost with discount card is approximately $18.

## 2023-08-25 NOTE — Telephone Encounter (Signed)
Left second VM to RC.  

## 2023-08-25 NOTE — Telephone Encounter (Signed)
Sent MyChart message to call the office.

## 2023-08-26 ENCOUNTER — Ambulatory Visit: Payer: Medicare HMO | Admitting: Psychiatry

## 2023-08-26 NOTE — Telephone Encounter (Signed)
Left third VM to RC. Previously sent a MyChart message as well.

## 2023-09-11 ENCOUNTER — Ambulatory Visit: Payer: Medicare HMO | Admitting: Psychiatry

## 2023-09-16 ENCOUNTER — Other Ambulatory Visit: Payer: Self-pay | Admitting: Nurse Practitioner

## 2023-09-23 ENCOUNTER — Other Ambulatory Visit: Payer: Medicare HMO

## 2023-09-25 ENCOUNTER — Ambulatory Visit (INDEPENDENT_AMBULATORY_CARE_PROVIDER_SITE_OTHER): Payer: Self-pay | Admitting: Psychiatry

## 2023-09-25 DIAGNOSIS — Z91199 Patient's noncompliance with other medical treatment and regimen due to unspecified reason: Secondary | ICD-10-CM

## 2023-09-25 NOTE — Progress Notes (Deleted)
Psychotherapy Progress Note [facility] Marliss Czar, PhD LP  Patient ID: Derrick [name])    MRN: [id number] Therapy format: {Therapy Types:21967::"Individual psychotherapy"} Date: 09/25/2023      Start: ***:***     Stop: ***:***     Time Spent: *** min Location: {SvcLoc:22530::"In-person"}   Session narrative (presenting needs, interim history, self-report of stressors and symptoms, applications of prior therapy, status changes, and interventions made in session) ***  Therapeutic modalities: {AM:23362::"Cognitive Behavioral Therapy","Solution-Oriented/Positive Psychology"}  Mental Status/Observations:  Appearance:   {PSY:22683}     Behavior:  {PSY:21022743}  Motor:  {PSY:22302}  Speech/Language:   {PSY:22685}  Affect:  {PSY:22687}  Mood:  {PSY:31886}  Thought process:  {PSY:31888}  Thought content:    {[phone]}  Sensory/Perceptual disturbances:    {PSY:8077844724}  Orientation:  {Psych Orientation:23301::"Fully oriented"}  Attention:  {Good-Fair-Poor ratings:23770::"Good"}    Concentration:  {Good-Fair-Poor ratings:23770::"Good"}  Memory:  {PSY:531-734-4069}  Insight:    {Good-Fair-Poor ratings:23770::"Good"}  Judgment:   {Good-Fair-Poor ratings:23770::"Good"}  Impulse Control:  {Good-Fair-Poor ratings:23770::"Good"}   Risk Assessment: Danger to Self: {Risk:22599::"No"} Self-injurious Behavior: {Risk:22599::"No"} Danger to Others: {Risk:22599::"No"} Physical Aggression / Violence: {Risk:22599::"No"} Duty to Warn: {AMYesNo:22526::"No"} Access to Firearms a concern: {AMYesNo:22526::"No"}  Assessment of progress:  {Progress:22147::"progressing"}  Diagnosis: No diagnosis found. Plan:  *** Other recommendations/advice -- As may be noted above.  Continue to utilize previously learned skills ad lib. Medication compliance -- Maintain medication as prescribed and work faithfully with relevant prescriber(s) if any changes are desired or seem  indicated. Crisis service -- Aware of call list and work-in appts.  Call the clinic on-call service, 988/hotline, 911, or present to [facility] or ER if any life-threatening psychiatric crisis. Followup -- No follow-ups on file.  Next scheduled visit with me Visit date not found.  Next scheduled in this office 11/16/2023.  Robley Fries, PhD Marliss Czar, PhD LP Clinical Psychologist, [facility] [address] [place], [place] [address] [phone]

## 2023-09-25 NOTE — Progress Notes (Signed)
Admin note for non-service contact  Patient ID: Derrick [name]  MRN: [id number] DATE: 09/25/2023  Noshow for 3pm appt.  Not typical time, but scheduled cogently 2 wks ago.  EHR shows medication issue and attempts to contact 9/5 through 9/11, all calls/messages unreturned by Pt.  Psychiatry notified.  Currently only scheduled for Ms. Hurst, 11/16/23.  Robley Fries, PhD Marliss Czar, PhD LP Clinical Psychologist, [facility] [address] [place], [place] [address] [phone]

## 2023-09-30 ENCOUNTER — Other Ambulatory Visit: Payer: Self-pay

## 2023-09-30 MED ORDER — FENOFIBRATE 48 MG PO TABS: 48.0000 mg | ORAL_TABLET | Freq: Every day | ORAL | 2 refills | Status: DC

## 2023-10-01 DIAGNOSIS — L820 Inflamed seborrheic keratosis: Secondary | ICD-10-CM | POA: Diagnosis not present

## 2023-10-13 DIAGNOSIS — Z79899 Other long term (current) drug therapy: Secondary | ICD-10-CM | POA: Diagnosis not present

## 2023-10-13 DIAGNOSIS — F319 Bipolar disorder, unspecified: Secondary | ICD-10-CM | POA: Diagnosis not present

## 2023-10-15 ENCOUNTER — Other Ambulatory Visit: Payer: Self-pay | Admitting: Physician Assistant

## 2023-10-15 DIAGNOSIS — Z79899 Other long term (current) drug therapy: Secondary | ICD-10-CM

## 2023-10-15 DIAGNOSIS — R899 Unspecified abnormal finding in specimens from other organs, systems and tissues: Secondary | ICD-10-CM

## 2023-10-15 DIAGNOSIS — F319 Bipolar disorder, unspecified: Secondary | ICD-10-CM

## 2023-10-15 LAB — CBC WITH DIFFERENTIAL/PLATELET
Basophils Absolute: 0 10*3/uL (ref 0.0–0.2)
Basos: 1 %
EOS (ABSOLUTE): 0.1 10*3/uL (ref 0.0–0.4)
Eos: 2 %
Hematocrit: 42.9 % (ref 37.5–51.0)
Hemoglobin: 14.1 g/dL (ref 13.0–17.7)
Immature Grans (Abs): 0.1 10*3/uL (ref 0.0–0.1)
Immature Granulocytes: 1 %
Lymphocytes Absolute: 2.5 10*3/uL (ref 0.7–3.1)
Lymphs: 43 %
MCH: 32.3 pg (ref 26.6–33.0)
MCHC: 32.9 g/dL (ref 31.5–35.7)
MCV: 98 fL — ABNORMAL HIGH (ref 79–97)
Monocytes Absolute: 0.6 10*3/uL (ref 0.1–0.9)
Monocytes: 10 %
Neutrophils Absolute: 2.6 10*3/uL (ref 1.4–7.0)
Neutrophils: 43 %
Platelets: 160 10*3/uL (ref 150–450)
RBC: 4.36 x10E6/uL (ref 4.14–5.80)
RDW: 13.4 % (ref 11.6–15.4)
WBC: 5.9 10*3/uL (ref 3.4–10.8)

## 2023-10-15 LAB — VALPROIC ACID LEVEL: Valproic Acid Lvl: 124 ug/mL (ref 50–100)

## 2023-10-15 MED ORDER — DIVALPROEX SODIUM 500 MG PO DR TAB: 1500.0000 mg | DELAYED_RELEASE_TABLET | Freq: Every day | ORAL | Status: DC

## 2023-10-15 NOTE — Progress Notes (Signed)
LVM to RC. Mailed lab slip.

## 2023-10-15 NOTE — Progress Notes (Signed)
Please call pt, his depakote level is way too high. Have him decrease dose from 4 pills to 3 po at bedtime. Will need a repeat level in a week to 10 days, as close to 12 hours after taking the depakote as possible.  I'll order the lab, please mail it to him. Thanks.

## 2023-11-16 ENCOUNTER — Ambulatory Visit: Payer: Medicare HMO | Admitting: Physician Assistant

## 2023-11-16 ENCOUNTER — Encounter: Payer: Self-pay | Admitting: Physician Assistant

## 2023-11-16 DIAGNOSIS — F411 Generalized anxiety disorder: Secondary | ICD-10-CM | POA: Diagnosis not present

## 2023-11-16 DIAGNOSIS — R899 Unspecified abnormal finding in specimens from other organs, systems and tissues: Secondary | ICD-10-CM

## 2023-11-16 DIAGNOSIS — F319 Bipolar disorder, unspecified: Secondary | ICD-10-CM

## 2023-11-16 MED ORDER — ALPRAZOLAM 0.5 MG PO TABS: 0.2500 mg | ORAL_TABLET | Freq: Two times a day (BID) | ORAL | 2 refills | Status: DC | PRN

## 2023-11-16 MED ORDER — DIVALPROEX SODIUM 500 MG PO DR TAB: 1500.0000 mg | DELAYED_RELEASE_TABLET | Freq: Every day | ORAL | 3 refills | Status: DC
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
## 2023-11-23 ENCOUNTER — Other Ambulatory Visit: Payer: Self-pay | Admitting: Nurse Practitioner

## 2023-12-22 ENCOUNTER — Other Ambulatory Visit: Payer: Medicare HMO

## 2023-12-22 DIAGNOSIS — Z79899 Other long term (current) drug therapy: Secondary | ICD-10-CM | POA: Diagnosis not present

## 2023-12-22 DIAGNOSIS — F319 Bipolar disorder, unspecified: Secondary | ICD-10-CM

## 2023-12-22 DIAGNOSIS — R899 Unspecified abnormal finding in specimens from other organs, systems and tissues: Secondary | ICD-10-CM

## 2023-12-22 NOTE — Progress Notes (Signed)
 Patient came in to [facility] office to have his blood work drawn and there were no orders for us . He provided lab staff a print out of what needed to be drawn. Carin brought me the paperwork asking if this could be done since it was ordered by another office and was needing to be sent to [facility]. I okayed the patient to have his labs done here since it was not his fault he was scheduled incorrectly and I did not want to inconvenience him since he was here. I reordered the tests he needed and had it sent for cosign to Verneita Cooks, [name], the provider that ordered them previously and had these sent to [facility] for testing. Typically we do not perform labs that are requested by another provider but provider is a [facility] provider and he is a current [facility] patient also. I let [facility] agents know that we can no perform blood work for other offices.

## 2023-12-23 NOTE — Progress Notes (Signed)
 Thank you for making an exception for him!  LFTs and Depakote level are still pending but once those results come in, one of our nurses will contact pt. Thanks again! Rosey Bath

## 2023-12-24 ENCOUNTER — Telehealth: Payer: Self-pay

## 2023-12-24 LAB — SPECIMEN STATUS REPORT

## 2023-12-24 NOTE — Telephone Encounter (Signed)
 Spoke with [facility] and clarified what the sample was and it is being performed. Derrick [name] is aware.

## 2023-12-24 NOTE — Telephone Encounter (Signed)
 Copied from CRM [id number]. Topic: Clinical - Lab/Test Results >> Dec 23, 2023  4:51 PM Tiffany H wrote: Reason for CRM: Thompson Caul [phone] Transferred specimen that was unmarked. Para March needs to know what test it was for. Please assist.

## 2023-12-25 LAB — COMPREHENSIVE METABOLIC PANEL
ALT: 29 [IU]/L (ref 0–44)
AST: 26 [IU]/L (ref 0–40)
Albumin: 4.4 g/dL (ref 3.9–4.9)
Alkaline Phosphatase: 52 [IU]/L (ref 44–121)
BUN/Creatinine Ratio: 8 — ABNORMAL LOW (ref 10–24)
BUN: 7 mg/dL — ABNORMAL LOW (ref 8–27)
Bilirubin Total: 0.4 mg/dL (ref 0.0–1.2)
CO2: 22 mmol/L (ref 20–29)
Calcium: 9.4 mg/dL (ref 8.6–10.2)
Chloride: 104 mmol/L (ref 96–106)
Creatinine, Ser: 0.88 mg/dL (ref 0.76–1.27)
Globulin, Total: 2 g/dL (ref 1.5–4.5)
Glucose: 109 mg/dL — ABNORMAL HIGH (ref 70–99)
Potassium: 4.1 mmol/L (ref 3.5–5.2)
Sodium: 141 mmol/L (ref 134–144)
Total Protein: 6.4 g/dL (ref 6.0–8.5)
eGFR: 95 mL/min/{1.73_m2} (ref >59)

## 2023-12-25 LAB — VALPROIC ACID LEVEL: Valproic Acid Lvl: 101 ug/mL — ABNORMAL HIGH (ref 50–100)

## 2023-12-25 NOTE — Progress Notes (Signed)
 Please let him know the depakote level is in a good range.  Continue the same dose. CMP is nl. No change in treatment.

## 2024-01-21 ENCOUNTER — Ambulatory Visit: Payer: Medicare HMO | Admitting: Family Medicine

## 2024-01-21 ENCOUNTER — Encounter: Payer: Self-pay | Admitting: Family Medicine

## 2024-01-21 DIAGNOSIS — J309 Allergic rhinitis, unspecified: Secondary | ICD-10-CM

## 2024-01-21 MED ORDER — MONTELUKAST SODIUM 10 MG PO TABS: 10.0000 mg | ORAL_TABLET | Freq: Every day | ORAL | 3 refills | Status: DC

## 2024-01-21 NOTE — Assessment & Plan Note (Signed)
 Continue with Montelukast  10 mg daily. Refilled medication for a year supply.

## 2024-01-21 NOTE — Progress Notes (Signed)
   Established Patient Office Visit   Subjective:  Patient ID: Derrick [name], male    DOB: [birth year]  Age: 68 y.o. MRN: [id number]  Chief Complaint  Patient presents with   Medical Management of Chronic Issues    HPI Allergic Rhinitis: Chronic. Patient is taking Singular 10mg  daily. He reports medication is effective. He reports he went to obtain a refill and he his prescription was old since June.   ROS See HPI above     Objective:   BP 110/80 (BP Location: Left Arm, Patient Position: Sitting, Cuff Size: Normal)   Pulse 88   Temp 98.4 F (36.9 C) (Oral)   Wt 179 lb 6.4 oz (81.4 kg)   SpO2 96%   BMI 25.74 kg/m    Physical Exam Vitals reviewed.  Constitutional:      General: He is not in acute distress.    Appearance: Normal appearance. He is not ill-appearing, toxic-appearing or diaphoretic.  HENT:     Head: Normocephalic and atraumatic.     Right Ear: Tympanic membrane and external ear normal.     Left Ear: Tympanic membrane and external ear normal.     Ears:     Comments: Mild to moderate cerumen bilateral ear canals; partial visualization of bilateral TM.  Eyes:     General:        Right eye: No discharge.        Left eye: No discharge.     Conjunctiva/sclera: Conjunctivae normal.  Cardiovascular:     Rate and Rhythm: Normal rate.  Pulmonary:     Effort: Pulmonary effort is normal. No respiratory distress.  Musculoskeletal:        General: Normal range of motion.  Skin:    General: Skin is warm and dry.  Neurological:     General: No focal deficit present.     Mental Status: He is alert and oriented to person, place, and time. Mental status is at baseline.  Psychiatric:        Mood and Affect: Mood normal.        Behavior: Behavior normal.        Thought Content: Thought content normal.        Judgment: Judgment normal.      Assessment & Plan:  Chronic allergic rhinitis Assessment & Plan: Continue with Montelukast  10 mg daily. Refilled medication  for a year supply.   Orders: -     Montelukast  Sodium; Take 1 tablet (10 mg total) by mouth daily.  Dispense: 90 tablet; Refill: 3   -Advised patient he may use over the counter Debrox to cleanse ear canals from ear wax build up.   Roye Gustafson, NP

## 2024-01-21 NOTE — Patient Instructions (Addendum)
-  It was a pleasure to see you today. -Refilled Singular 10mg  daily for year supply. -May use over the counter Debrox to cleanse ear canals from ear wax build up.  -Take care and see you in June for your physical.

## 2024-01-28 DIAGNOSIS — L820 Inflamed seborrheic keratosis: Secondary | ICD-10-CM | POA: Diagnosis not present

## 2024-02-06 ENCOUNTER — Other Ambulatory Visit: Payer: Self-pay | Admitting: Nurse Practitioner

## 2024-02-24 DIAGNOSIS — L820 Inflamed seborrheic keratosis: Secondary | ICD-10-CM | POA: Diagnosis not present

## 2024-03-02 ENCOUNTER — Other Ambulatory Visit: Payer: Self-pay | Admitting: Nurse Practitioner

## 2024-03-16 ENCOUNTER — Encounter: Payer: Self-pay | Admitting: Physician Assistant

## 2024-03-16 ENCOUNTER — Ambulatory Visit: Payer: Medicare HMO | Admitting: Physician Assistant

## 2024-03-16 DIAGNOSIS — F411 Generalized anxiety disorder: Secondary | ICD-10-CM

## 2024-03-16 DIAGNOSIS — F319 Bipolar disorder, unspecified: Secondary | ICD-10-CM

## 2024-03-16 MED ORDER — LAMOTRIGINE 100 MG PO TABS: 100.0000 mg | ORAL_TABLET | Freq: Two times a day (BID) | ORAL | 3 refills | Status: DC

## 2024-03-16 NOTE — Progress Notes (Signed)
 [facility]  Patient ID: Derrick [name],  MRN: [id number]  PCP: Alveria Apley, NP  Date of Evaluation: 03/16/2024 Time spent:25 minutes  Chief Complaint:  Chief Complaint   Depression; Anxiety; Follow-up    HISTORY/CURRENT STATUS: HPI for routine med check.  Derrick [name] is doing well. Has a mild tremor in hands bilat that comes and goes, he has to eat with a spoon sometimes but that's the only problem.  He's had it for year. Doesn't feel like it is bad enough to worry about b/c it's not all the time. He just thought he'd mention it.   Patient is able to enjoy things.  Energy and motivation are good.   No extreme sadness, tearfulness, or feelings of hopelessness.  Sleeps well most of the time. ADLs and personal hygiene are normal.   Denies any changes in concentration, making decisions, or remembering things.  Appetite has not changed.  Weight is stable.  He does get anxious at times and needs the Xanax.  It does help when needed.  He does not take it very often.  Denies suicidal or homicidal thoughts.  Patient denies increased energy with decreased need for sleep, increased talkativeness, racing thoughts, impulsivity or risky behaviors, increased spending, increased libido, grandiosity, increased irritability or anger, paranoia, or hallucinations.  Denies dizziness, syncope, seizures, numbness, tingling, tics, unsteady gait, slurred speech, confusion. Denies muscle or joint pain, stiffness, or dystonia. Denies unexplained weight loss, frequent infections, or sores that heal slowly.  No polyphagia, polydipsia, or polyuria. Denies visual changes or paresthesias.   Individual Medical History/ Review of Systems: Changes? :No       Past medications for mental health diagnoses include: Zoloft, Prozac, Effexor XR, Lexapro, Depakote, Lamictal, Wellbutrin Xanax, Ambien, Sonata, Klonopin, Seroquel  Allergies: Patient has no known allergies.  Current Medications:  Current  Outpatient Medications:    ALPRAZolam (XANAX) 0.5 MG tablet, Take 0.5-1 tablets (0.25-0.5 mg total) by mouth 2 (two) times daily as needed for anxiety., Disp: 60 tablet, Rfl: 2   aspirin EC 81 MG tablet, Take 81 mg by mouth daily. Swallow whole., Disp: , Rfl:    atorvastatin (LIPITOR) 80 MG tablet, TAKE 1 TABLET EVERY DAY, Disp: 90 tablet, Rfl: 0   divalproex (DEPAKOTE) 500 MG DR tablet, Take 3 tablets (1,500 mg total) by mouth at bedtime., Disp: 270 tablet, Rfl: 3   escitalopram (LEXAPRO) 20 MG tablet, Take 1 tablet (20 mg total) by mouth daily., Disp: 90 tablet, Rfl: 3   ezetimibe (ZETIA) 10 MG tablet, Take 1 tablet (10 mg total) by mouth daily., Disp: 90 tablet, Rfl: 1   fenofibrate (TRICOR) 48 MG tablet, Take 1 tablet (48 mg total) by mouth daily., Disp: 90 tablet, Rfl: 2   isosorbide mononitrate (IMDUR) 30 MG 24 hr tablet, TAKE 1/2 TABLET EVERY DAY, Disp: 45 tablet, Rfl: 3   metoprolol succinate (TOPROL-XL) 25 MG 24 hr tablet, TAKE 1 TABLET EVERY DAY, WITH OR IMMEDIATELY FOLLOWING A MEAL., Disp: 90 tablet, Rfl: 1   montelukast (SINGULAIR) 10 MG tablet, Take 1 tablet (10 mg total) by mouth daily., Disp: 90 tablet, Rfl: 3   pantoprazole (PROTONIX) 40 MG tablet, Take 1 tablet (40 mg total) by mouth daily., Disp: 90 tablet, Rfl: 3   QUEtiapine (SEROQUEL XR) 300 MG 24 hr tablet, Take 1 tablet (300 mg total) by mouth at bedtime., Disp: 90 tablet, Rfl: 3   lamoTRIgine (LAMICTAL) 100 MG tablet, Take 1 tablet (100 mg total) by mouth 2 (two) times daily.,  Disp: 180 tablet, Rfl: 3   nitroGLYCERIN (NITROSTAT) 0.4 MG SL tablet, Place 1 tablet (0.4 mg total) under the tongue every 5 (five) minutes as needed for chest pain. (Patient not taking: Reported on 08/14/2023), Disp: 25 tablet, Rfl: 3 Medication Side Effects: none  Family Medical/ Social History: Changes? no  MENTAL HEALTH EXAM:  There were no vitals taken for this visit.There is no height or weight on file to calculate BMI.  General Appearance:  Casual and Well Groomed  Eye Contact:  Good  Speech:  Clear and Coherent and Normal Rate  Volume:  Normal  Mood:  Euthymic  Affect:  Congruent  Thought Process:  Goal Directed and Descriptions of Associations: Intact  Orientation:  Full (Time, Place, and Person)  Thought Content: Logical   Suicidal Thoughts:  No  Homicidal Thoughts:  No  Memory:  WNL  Judgement:  Good  Insight:  Good  Psychomotor Activity:  Normal and without tremor    Concentration:  Concentration: Good and Attention Span: Good  Recall:  Good  Fund of Knowledge: Good  Language: Good  Assets:  Desire for Improvement Financial Resources/Insurance Housing Transportation  ADL's:  Intact  Cognition: WNL  Prognosis:  Good   Labs on chart from 12/22/2023 reviewed.  DIAGNOSES:    ICD-10-CM   1. Bipolar I disorder ([name])  F31.9     2. Generalized anxiety disorder  F41.1       Receiving Psychotherapy: Yes  with Dr. Marliss Czar  RECOMMENDATIONS:  PDMP reviewed.  Hydrocodone given 05/06/2023.  Xanax filled 11/19/2023.. I provided 25 minutes of face to face time during this encounter, including time spent before and after the visit in records review, medical decision making, counseling pertinent to today's visit, and charting.   He is doing well so no changes need to be made.  If the tremor worsens he should let me know and we will treat appropriately.  Continue Xanax 0.5 mg, 1/2-1 p.o. twice daily as needed anxiety. Continue Depakote DR 500 mg, 3 p.o. nightly. Continue Lexapro 20 mg p.o. daily. Continue Lamictal 100 mg, 1 po bid. Continue Seroquel XR 300 mg, 1/2 p.o. nightly.  (Listed on the med sheet for him to take 300 mg, only so he will get more for his money.) Continue therapy with Dr. Marliss Czar. Return in 4 months.  Melony Overly, [name]

## 2024-04-08 ENCOUNTER — Ambulatory Visit (INDEPENDENT_AMBULATORY_CARE_PROVIDER_SITE_OTHER): Admitting: Psychiatry

## 2024-04-08 DIAGNOSIS — F411 Generalized anxiety disorder: Secondary | ICD-10-CM | POA: Diagnosis not present

## 2024-04-08 DIAGNOSIS — F319 Bipolar disorder, unspecified: Secondary | ICD-10-CM

## 2024-04-08 DIAGNOSIS — F1991 Other psychoactive substance use, unspecified, in remission: Secondary | ICD-10-CM | POA: Diagnosis not present

## 2024-04-08 DIAGNOSIS — Z630 Problems in relationship with spouse or partner: Secondary | ICD-10-CM | POA: Diagnosis not present

## 2024-04-08 DIAGNOSIS — F068 Other specified mental disorders due to known physiological condition: Secondary | ICD-10-CM
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
## 2024-04-21 ENCOUNTER — Other Ambulatory Visit: Payer: Self-pay | Admitting: Nurse Practitioner

## 2024-04-29 ENCOUNTER — Ambulatory Visit: Admitting: Nurse Practitioner

## 2024-05-14 ENCOUNTER — Other Ambulatory Visit: Payer: Self-pay | Admitting: Nurse Practitioner

## 2024-05-15 ENCOUNTER — Other Ambulatory Visit: Payer: Self-pay | Admitting: Nurse Practitioner

## 2024-05-20 ENCOUNTER — Ambulatory Visit (INDEPENDENT_AMBULATORY_CARE_PROVIDER_SITE_OTHER): Payer: Medicare HMO | Admitting: Family Medicine

## 2024-05-20 ENCOUNTER — Encounter: Payer: Self-pay | Admitting: Family Medicine

## 2024-05-20 VITALS — BP 110/80 | HR 90 | Temp 97.6°F | Ht 67.0 in | Wt 182.0 lb

## 2024-05-20 DIAGNOSIS — E663 Overweight: Secondary | ICD-10-CM | POA: Diagnosis not present

## 2024-05-20 DIAGNOSIS — Z125 Encounter for screening for malignant neoplasm of prostate: Secondary | ICD-10-CM

## 2024-05-20 DIAGNOSIS — Z87891 Personal history of nicotine dependence: Secondary | ICD-10-CM

## 2024-05-20 DIAGNOSIS — Z862 Personal history of diseases of the blood and blood-forming organs and certain disorders involving the immune mechanism: Secondary | ICD-10-CM

## 2024-05-20 DIAGNOSIS — Z130 Encounter for screening for diseases of the blood and blood-forming organs and certain disorders involving the immune mechanism: Secondary | ICD-10-CM | POA: Diagnosis not present

## 2024-05-20 DIAGNOSIS — E7800 Pure hypercholesterolemia, unspecified: Secondary | ICD-10-CM | POA: Diagnosis not present

## 2024-05-20 DIAGNOSIS — I10 Essential (primary) hypertension: Secondary | ICD-10-CM

## 2024-05-20 NOTE — Patient Instructions (Signed)
-  It was great to see you today.  -Physical completed today. No concerns.  -Ordered referral for CT lung screening.  -Ordered labs. Office will call with lab results and you will see them on MyChart.  -Continue all prescribed medications. -Recommend a healthy diet and participating in exercise on a regular base. -Follow up in 1 year.

## 2024-05-20 NOTE — Progress Notes (Signed)
 Complete physical exam  Patient: Derrick [name]   DOB: 23-Oct-1956   68 y.o. Male  MRN: [id number]  Subjective:     Chief Complaint  Patient presents with   Annual Exam    Derrick [name] is a 68 y.o. male who presents today for a complete physical exam. He reports consuming a general diet. Exercise: Working in the yard. He generally feels well. He reports sleeping well. He does have additional problems to discuss today.    Most recent fall risk assessment:    05/20/2024    2:06 PM  Fall Risk   Falls in the past year? 0  Number falls in past yr: 0  Injury with Fall? 0  Risk for fall due to : No Fall Risks  Follow up Falls evaluation completed     Most recent depression screenings:    05/20/2024    2:06 PM 04/30/2023    3:09 PM  PHQ 2/9 Scores  PHQ - 2 Score 0 3  PHQ- 9 Score 3 13    Vision:Within last year and Dental: No current dental problems and Receives regular dental care  Past Medical History:  Diagnosis Date   Anxiety    Bipolar 1 disorder ([name])    Bipolar disorder ([name]) 10/05/2009   Qualifier: Diagnosis of  By: Jaramillo, Luz     BPH (benign prostatic hypertrophy)    CAD (coronary artery disease), native coronary artery    a. DES x 2 to RCA in 2004 b. 02/11/17 PCI w/DES x1 to mRCA   Coronary atherosclerosis 10/05/2009   Qualifier: Diagnosis of  By: Malon Seamen   ANGIOGRAPHIC DATA:  1. Left main coronary artery was free of critical disease.  2. The LAD coursed to the apex. There is mild luminal irregularity  including mild irregularity of the diagonal. No high grade areas of  stenosis were noted. There was faint collateralization of the distal  right coronary circulation.  3. The circumflex provided two major m   Depression    Dyspnea 09/20/2012   Essential and other specified forms of tremor 02/05/2013   HYPERCHOLESTEROLEMIA 10/05/2009   Qualifier: Diagnosis of  By: Malon Seamen     Hyperlipidemia    Memory loss 02/05/2013   Mitral regurgitation and  aortic stenosis    MITRAL STENOSIS/ INSUFFICIENCY, NON-RHEUMATIC 01/06/2011   Qualifier: Diagnosis of  By: [name] Null, MD, Daralyn Earl Study Conclusions  - Left ventricle: The cavity size was normal. Wall thickness was increased in a pattern of mild LVH. Systolic function was normal. The estimated ejection fraction was in the range of 55% to 60%. Wall motion was normal; there were no regional wall motion abnormalities. Left ventricular diastolic function parameters were   MVP (mitral valve prolapse)    Myocardial infarction ([name]) 2012   Obstructive sleep apnea 02/05/2013   OSA (obstructive sleep apnea) 03/22/2013   Parasomnia 02/05/2013   Sleep apnea    Sleep disorder with cognitive complaints 10/26/2018   Subdural hematoma, post-traumatic ([name])    fell and hit his head on ice and urgent care and pcp but did not tell them he had fallen and hit his head.   Unspecified hereditary and idiopathic peripheral neuropathy 02/05/2013   Past Surgical History:  Procedure Laterality Date   COLONOSCOPY     CORONARY STENT INTERVENTION N/A 02/12/2017   Procedure: Coronary Stent Intervention;  Surgeon: Peter M Swaziland, MD;  Location[facility] INVASIVE CV LAB;  Service: Cardiovascular;  Laterality: N/A;   CRANIOTOMY Left  01/28/2021   Procedure: CRANIOTOMY HEMATOMA EVACUATION SUBDURAL;  Surgeon: Isadora Mar, MD;  Location: [facility] OR;  Service: Neurosurgery;  Laterality: Left;   LEFT HEART CATH AND CORONARY ANGIOGRAPHY N/A 02/12/2017   Procedure: Left Heart Cath and Coronary Angiography;  Surgeon: Darlis Eisenmenger, MD;  Location: [facility] INVASIVE CV LAB;  Service: Cardiovascular;  Laterality: N/A;   POLYPECTOMY     Social History   Tobacco Use   Smoking status: Every Day    Current packs/day: 0.10    Types: Cigarettes   Smokeless tobacco: Never   Tobacco comments:    4 cigs per day updated 09/19/2021  Vaping Use   Vaping status: Never Used  Substance Use Topics   Alcohol use: Not Currently    Comment: Quit  at age 42   Drug use: Never   Social History   Socioeconomic History   Marital status: Married    Spouse name: Ninette Basque   Number of children: 2   Years of education: Not on file   Highest education level: 12th grade  Occupational History   Not on file  Tobacco Use   Smoking status: Every Day    Current packs/day: 0.10    Types: Cigarettes   Smokeless tobacco: Never   Tobacco comments:    4 cigs per day updated 09/19/2021  Vaping Use   Vaping status: Never Used  Substance and Sexual Activity   Alcohol use: Not Currently    Comment: Quit at age 77   Drug use: Never   Sexual activity: Not Currently  Other Topics Concern   Not on file  Social History Narrative   Oldest daughter died in 05/28/2015 from ovarian ca.   5 grandchildren   Social Drivers of Corporate investment banker Strain: Low Risk  (01/20/2024)   Overall Financial Resource Strain (CARDIA)    Difficulty of Paying Living Expenses: Not very hard  Food Insecurity: No Food Insecurity (01/20/2024)   Hunger Vital Sign    Worried About Running Out of Food in the Last Year: Never true    Ran Out of Food in the Last Year: Never true  Transportation Needs: No Transportation Needs (01/20/2024)   PRAPARE - Administrator, Civil Service (Medical): No    Lack of Transportation (Non-Medical): No  Physical Activity: Unknown (01/20/2024)   Exercise Vital Sign    Days of Exercise per Week: 0 days    Minutes of Exercise per Session: Not on file  Recent Concern: Physical Activity - Inactive (01/20/2024)   Exercise Vital Sign    Days of Exercise per Week: 0 days    Minutes of Exercise per Session: 0 min  Stress: Stress Concern Present (01/20/2024)   [employer] of Occupational Health - Occupational Stress Questionnaire    Feeling of Stress : Very much  Social Connections: Moderately Integrated (01/20/2024)   Social Connection and Isolation Panel [NHANES]    Frequency of Communication with Friends and Family: More than three  times a week    Frequency of Social Gatherings with Friends and Family: Once a week    Attends Religious Services: More than 4 times per year    Active Member of [employer] or Organizations: No    Attends Banker Meetings: Not on file    Marital Status: Married  Intimate Partner Violence: Not At Risk (04/30/2023)   Humiliation, Afraid, Rape, and Kick questionnaire    Fear of Current or Ex-Partner: No    Emotionally Abused: No  Physically Abused: No    Sexually Abused: No   Family Status  Relation Name Status   Mother  Deceased   Father  Deceased   Sister Stephenie Einstein Deceased at age 76       dx at age 55   Sister Hazle Lites       dx with breast ca in her 34s but living into her 26s at this time(09/06/21)   Sister  Alive   Sister  Alive   Sister  Alive   Brother  Alive   Daughter  Deceased at age 36       Ovarian Cancer at 62   Daughter  Alive   MGM  Deceased   MGF  Deceased   PGM  Deceased   PGF  Deceased   Neg Hx  (Not Specified)  No partnership data on file   Family History  Problem Relation Age of Onset   Heart disease Mother    COPD Father    Colon polyps Sister    Colon cancer Sister    Heart disease Sister    Heart disease Sister    Heart disease Brother    Ovarian cancer Daughter    Esophageal cancer Neg Hx    Rectal cancer Neg Hx    Stomach cancer Neg Hx    No Known Allergies   Patient Care Team: Francenia Ingle, NP as PCP - General (Family Medicine) Valentina Gasman, Karon Packer., NP as PCP - Cardiology (Nurse Practitioner) Gaile Jourdain, Larene Pleasant, LCSW as [facility] Care Management (Licensed Clinical Social Worker) Chet Cota, Donne Gage as Physician Assistant (Psychiatry) Maretta Shaper, PhD (Psychiatry)   Outpatient Medications Prior to Visit  Medication Sig   ALPRAZolam  (XANAX ) 0.5 MG tablet Take 0.5-1 tablets (0.25-0.5 mg total) by mouth 2 (two) times daily as needed for anxiety.   aspirin  EC 81 MG tablet Take 81 mg by mouth daily.  Swallow whole.   atorvastatin  (LIPITOR ) 80 MG tablet Take 1 tablet (80 mg total) by mouth daily. Please keep scheduled appointment for future refills. Thank you.   divalproex  (DEPAKOTE ) 500 MG DR tablet Take 3 tablets (1,500 mg total) by mouth at bedtime.   escitalopram  (LEXAPRO ) 20 MG tablet Take 1 tablet (20 mg total) by mouth daily.   ezetimibe  (ZETIA ) 10 MG tablet Take 1 tablet (10 mg total) by mouth daily.   fenofibrate  (TRICOR ) 48 MG tablet Take 1 tablet (48 mg total) by mouth daily.   isosorbide  mononitrate (IMDUR ) 30 MG 24 hr tablet TAKE 1/2 TABLET EVERY DAY   lamoTRIgine  (LAMICTAL ) 100 MG tablet Take 1 tablet (100 mg total) by mouth 2 (two) times daily.   metoprolol  succinate (TOPROL -XL) 25 MG 24 hr tablet TAKE 1 TABLET EVERY DAY, WITH OR IMMEDIATELY FOLLOWING A MEAL.   montelukast  (SINGULAIR ) 10 MG tablet Take 1 tablet (10 mg total) by mouth daily.   nitroGLYCERIN  (NITROSTAT ) 0.4 MG SL tablet Place 1 tablet (0.4 mg total) under the tongue every 5 (five) minutes as needed for chest pain.   pantoprazole  (PROTONIX ) 40 MG tablet Take 1 tablet (40 mg total) by mouth daily.   QUEtiapine  (SEROQUEL  XR) 300 MG 24 hr tablet Take 1 tablet (300 mg total) by mouth at bedtime.   No facility-administered medications prior to visit.    Review of Systems  Constitutional:  Positive for malaise/fatigue.  HENT:  Positive for hearing loss.   Eyes: Negative.   Respiratory: Negative.    Cardiovascular: Negative.   Gastrointestinal:  Positive  for heartburn.  Genitourinary: Negative.   Musculoskeletal: Negative.   Skin: Negative.   Neurological: Negative.   Endo/Heme/Allergies: Negative.   Psychiatric/Behavioral:  Positive for depression. The patient is nervous/anxious.    See HPI above       Objective:   BP 110/80   Pulse 90   Temp 97.6 F (36.4 C) (Oral)   Ht 5\' 7"  (1.702 m)   Wt 182 lb (82.6 kg)   SpO2 96%   BMI 28.51 kg/m    Physical Exam Vitals reviewed.  Constitutional:       General: He is not in acute distress.    Appearance: Normal appearance. He is overweight. He is not ill-appearing or toxic-appearing.  HENT:     Head: Normocephalic and atraumatic.     Right Ear: Tympanic membrane, ear canal and external ear normal. There is no impacted cerumen.     Left Ear: Tympanic membrane, ear canal and external ear normal. There is no impacted cerumen.     Nose:     Right Sinus: No maxillary sinus tenderness or frontal sinus tenderness.     Left Sinus: No maxillary sinus tenderness or frontal sinus tenderness.     Mouth/Throat:     Mouth: Mucous membranes are moist.     Pharynx: Oropharynx is clear. Uvula midline. No pharyngeal swelling, oropharyngeal exudate, posterior oropharyngeal erythema, uvula swelling or postnasal drip.  Eyes:     General:        Right eye: No discharge.        Left eye: No discharge.     Conjunctiva/sclera: Conjunctivae normal.     Pupils: Pupils are equal, round, and reactive to light.  Neck:     Thyroid : No thyromegaly.  Cardiovascular:     Rate and Rhythm: Normal rate and regular rhythm.     Pulses:          Dorsalis pedis pulses are 2+ on the right side and 2+ on the left side.     Heart sounds: Normal heart sounds. No murmur heard.    No friction rub. No gallop.  Pulmonary:     Effort: Pulmonary effort is normal. No respiratory distress.     Breath sounds: Normal breath sounds.  Abdominal:     General: Abdomen is flat. Bowel sounds are normal. There is no distension.     Palpations: Abdomen is soft. There is no mass.     Tenderness: There is no abdominal tenderness. There is no right CVA tenderness or left CVA tenderness.  Musculoskeletal:        General: Normal range of motion.     Cervical back: Normal range of motion.     Right lower leg: No edema.     Left lower leg: No edema.  Lymphadenopathy:     Head:     Right side of head: No submental or submandibular adenopathy.     Left side of head: No submental or  submandibular adenopathy.     Cervical: No cervical adenopathy.  Skin:    General: Skin is warm and dry.  Neurological:     General: No focal deficit present.     Mental Status: He is alert and oriented to person, place, and time. Mental status is at baseline.     Motor: No weakness.     Gait: Gait normal.  Psychiatric:        Mood and Affect: Mood normal.        Behavior: Behavior normal.  Thought Content: Thought content normal.        Judgment: Judgment normal.        Assessment & Plan:    Routine Health Maintenance and Physical Exam  Immunization History  Administered Date(s) Administered   Influenza,inj,Quad PF,6+ Mos 12/12/2013, 10/03/2014, 10/16/2015, 10/16/2016, 10/28/2017, 09/20/2018, 09/20/2019, 10/23/2020, 10/23/2020, 09/26/2021, 09/03/2022   Influenza,inj,quad, With Preservative 10/15/2020   Janssen (J&J) SARS-COV-2 Vaccination 04/12/2020   Moderna Sars-Covid-2 Vaccination 12/15/2020   Tdap 05/27/2017   Zoster Recombinant(Shingrix ) 09/03/2022    Health Maintenance  Topic Date Due   Pneumonia Vaccine 29+ Years old (1 of 2 - PCV) Never done   Zoster Vaccines- Shingrix  (2 of 2) 10/29/2022   COVID-19 Vaccine (3 - 2024-25 season) 08/16/2023   Medicare Annual Wellness (AWV)  04/29/2024   INFLUENZA VACCINE  07/15/2024   Colonoscopy  09/20/2024   DTaP/Tdap/Td (2 - Td or Tdap) 05/28/2027   Hepatitis C Screening  Completed   HPV VACCINES  Aged Out   Meningococcal B Vaccine  Aged Out    Discussed health benefits of physical activity, and encouraged him to engage in regular exercise appropriate for his age and condition.  Primary hypertension -     Comprehensive metabolic panel with GFR  Hypercholesteremia -     Comprehensive metabolic panel with GFR -     Lipid panel  Prostate cancer screening -     PSA  History of anemia -     Iron, TIBC and Ferritin Panel  Overweight (BMI 25.0-29.9) -     CBC with Differential/Platelet -     Comprehensive  metabolic panel with GFR -     Hemoglobin A1c -     TSH  History of tobacco abuse -     Ambulatory Referral for Lung Cancer Scre   1.Review health maintenance:  -Covid booster: Declines  -AWV: Needs appt  -PNA vaccine: Declines  -Zoster vaccine: [facility]  -CT lung screening: Ordered with tobacco use.  2.Physical completed today. No concerns.  3. Ordered labs (CBC, CMP, TSH, PSA, A1c, Lipid panel-fasting, Iron profile) based on BMI-overweight, history of anemia, HTN, hypercholesteremia, and screening.   4.Continue all prescribed medications. 5. Recommend a healthy diet and participating in exercise on a regular base. Return in about 1 year (around 05/20/2025) for Needs AWV with LPN or Dr. Burdette Carolin , physical.   Johanna Stafford, NP

## 2024-05-21 LAB — CBC WITH DIFFERENTIAL/PLATELET
Absolute Lymphocytes: 2046 {cells}/uL (ref 850–3900)
Absolute Monocytes: 614 {cells}/uL (ref 200–950)
Basophils Absolute: 19 {cells}/uL (ref 0–200)
Basophils Relative: 0.3 %
Eosinophils Absolute: 68 {cells}/uL (ref 15–500)
Eosinophils Relative: 1.1 %
HCT: 40.6 % (ref 38.5–50.0)
Hemoglobin: 13.7 g/dL (ref 13.2–17.1)
MCH: 32.3 pg (ref 27.0–33.0)
MCHC: 33.7 g/dL (ref 32.0–36.0)
MCV: 95.8 fL (ref 80.0–100.0)
MPV: 9.7 fL (ref 7.5–12.5)
Monocytes Relative: 9.9 %
Neutro Abs: 3453 {cells}/uL (ref 1500–7800)
Neutrophils Relative %: 55.7 %
Platelets: 174 10*3/uL (ref 140–400)
RBC: 4.24 10*6/uL (ref 4.20–5.80)
RDW: 12.6 % (ref 11.0–15.0)
Total Lymphocyte: 33 %
WBC: 6.2 10*3/uL (ref 3.8–10.8)

## 2024-05-21 LAB — COMPREHENSIVE METABOLIC PANEL WITH GFR
AG Ratio: 2 (calc) (ref 1.0–2.5)
ALT: 15 U/L (ref 9–46)
AST: 17 U/L (ref 10–35)
Albumin: 4.5 g/dL (ref 3.6–5.1)
Alkaline phosphatase (APISO): 53 U/L (ref 35–144)
BUN: 11 mg/dL (ref 7–25)
CO2: 27 mmol/L (ref 20–32)
Calcium: 9.7 mg/dL (ref 8.6–10.3)
Chloride: 102 mmol/L (ref 98–110)
Creat: 0.97 mg/dL (ref 0.70–1.35)
Globulin: 2.2 g/dL (ref 1.9–3.7)
Glucose, Bld: 111 mg/dL — ABNORMAL HIGH (ref 65–99)
Potassium: 4.8 mmol/L (ref 3.5–5.3)
Sodium: 137 mmol/L (ref 135–146)
Total Bilirubin: 0.6 mg/dL (ref 0.2–1.2)
Total Protein: 6.7 g/dL (ref 6.1–8.1)
eGFR: 86 mL/min/{1.73_m2} (ref >=60)

## 2024-05-21 LAB — LIPID PANEL
Cholesterol: 149 mg/dL (ref <200)
HDL: 39 mg/dL — ABNORMAL LOW (ref >=40)
LDL Cholesterol (Calc): 91 mg/dL
Non-HDL Cholesterol (Calc): 110 mg/dL (ref <130)
Total CHOL/HDL Ratio: 3.8 (calc) (ref <5.0)
Triglycerides: 101 mg/dL (ref <150)

## 2024-05-21 LAB — HEMOGLOBIN A1C
Hgb A1c MFr Bld: 7 % — ABNORMAL HIGH (ref <5.7)
Mean Plasma Glucose: 154 mg/dL
eAG (mmol/L): 8.5 mmol/L

## 2024-05-21 LAB — IRON,TIBC AND FERRITIN PANEL
%SAT: 34 % (ref 20–48)
Ferritin: 125 ng/mL (ref 24–380)
Iron: 115 ug/dL (ref 50–180)
TIBC: 339 ug/dL (ref 250–425)

## 2024-05-21 LAB — TSH: TSH: 1.54 m[IU]/L (ref 0.40–4.50)

## 2024-05-21 LAB — PSA: PSA: 1.26 ng/mL (ref <=4.00)

## 2024-05-23 ENCOUNTER — Ambulatory Visit: Payer: Self-pay | Admitting: Family Medicine

## 2024-05-23 NOTE — Progress Notes (Signed)
 Thanks for forwarding the results Kingsley Penny. Much appreciated!

## 2024-06-07 ENCOUNTER — Other Ambulatory Visit: Payer: Self-pay | Admitting: Cardiology

## 2024-06-09 ENCOUNTER — Other Ambulatory Visit: Payer: Self-pay

## 2024-06-09 ENCOUNTER — Telehealth: Payer: Self-pay

## 2024-06-09 ENCOUNTER — Ambulatory Visit (INDEPENDENT_AMBULATORY_CARE_PROVIDER_SITE_OTHER): Admitting: Psychiatry

## 2024-06-09 DIAGNOSIS — F319 Bipolar disorder, unspecified: Secondary | ICD-10-CM

## 2024-06-09 DIAGNOSIS — R7303 Prediabetes: Secondary | ICD-10-CM | POA: Diagnosis not present

## 2024-06-09 DIAGNOSIS — F1991 Other psychoactive substance use, unspecified, in remission: Secondary | ICD-10-CM | POA: Diagnosis not present

## 2024-06-09 DIAGNOSIS — Z122 Encounter for screening for malignant neoplasm of respiratory organs: Secondary | ICD-10-CM

## 2024-06-09 DIAGNOSIS — F17210 Nicotine dependence, cigarettes, uncomplicated: Secondary | ICD-10-CM

## 2024-06-09 DIAGNOSIS — F411 Generalized anxiety disorder: Secondary | ICD-10-CM | POA: Diagnosis not present

## 2024-06-09 DIAGNOSIS — F068 Other specified mental disorders due to known physiological condition: Secondary | ICD-10-CM | POA: Diagnosis not present

## 2024-06-09 DIAGNOSIS — Z87891 Personal history of nicotine dependence: Secondary | ICD-10-CM

## 2024-06-09 DIAGNOSIS — Z630 Problems in relationship with spouse or partner: Secondary | ICD-10-CM
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
## 2024-06-09 NOTE — Telephone Encounter (Signed)
.  Lung Cancer Screening Narrative/Criteria Questionnaire (Cigarette Smokers Only- No Cigars/Pipes/vapes)   Derrick [name]   SDMV:06/22/2024 at 10:30 [name]        [birth year]               LDCT: 07/12/2024 at 5:30 pm AP    68 y.o.   Phone: [phone]  Lung Screening Narrative (confirm age 82-77 yrs Medicare / 50-80 yrs Private pay insurance)   Insurance information: [employer]   Referring Provider:  Billy  This screening involves an initial phone call with a team member from our program. It is called a shared decision making visit. The initial meeting is required by  insurance and Medicare to make sure you understand the program. This appointment takes about 15-20 minutes to complete. You will complete the screening scan at your scheduled date/time.  This scan takes about 5-10 minutes to complete. You can eat and drink normally before and after the scan.  Criteria questions for Lung Cancer Screening:   Are you a current or former smoker? Current Age began smoking: 18   If you are a former smoker, what year did you quit smoking? Never quit over one year (within 15 yrs)   To calculate your smoking history, I need an accurate estimate of how many packs of cigarettes you smoked per day and for how many years. (Not just the number of PPD you are now smoking)   Years smoking 50 x Packs per day 1 = Pack years 50   (at least 20 pack yrs)   (Make sure they understand that we need to know how much they have smoked in the past, not just the number of PPD they are smoking now)  Do you have a personal history of cancer?  No    Do you have a family history of cancer? Yes  (cancer type and and relative) Daughter ovarian cancer  Are you coughing up blood?  No  Have you had unexplained weight loss of 15 lbs or more in the last 6 months? No  It looks like you meet all criteria.  When would be a good time for us  to schedule you for this screening?   Additional information: N/A

## 2024-06-16 ENCOUNTER — Encounter: Payer: Self-pay | Admitting: Family Medicine

## 2024-06-16 ENCOUNTER — Ambulatory Visit: Admitting: Family Medicine

## 2024-06-16 VITALS — BP 118/78 | HR 75 | Temp 97.9°F | Ht 67.0 in | Wt 184.0 lb

## 2024-06-16 DIAGNOSIS — Z7984 Long term (current) use of oral hypoglycemic drugs: Secondary | ICD-10-CM | POA: Diagnosis not present

## 2024-06-16 DIAGNOSIS — E119 Type 2 diabetes mellitus without complications: Secondary | ICD-10-CM

## 2024-06-16 MED ORDER — EMPAGLIFLOZIN 10 MG PO TABS: 10.0000 mg | ORAL_TABLET | Freq: Every day | ORAL | 1 refills | Status: AC

## 2024-06-16 NOTE — Patient Instructions (Addendum)
-  Discussed in detailed about diabetes, the affects of diabetes on the body, several medication options, importance of eating and participating in regular exercise.  -Through shared decision making, decided on Jardiance 10mg  daily. Discussed about taking medication and side effects of medication.  -Provided information about diabetes-exercise, diabetes-foot care, diabetes-nutrition, diabetes-self care, and about the medication.  -Follow up in 3 months for chronic management.

## 2024-06-16 NOTE — Progress Notes (Signed)
   Established Patient Office Visit   Subjective:  Patient ID: Derrick [name], male    DOB: 10-28-1956  Age: 68 y.o. MRN: [id number]  Chief Complaint  Patient presents with   Medical Management of Chronic Issues    diabetes    HPI Patient is following up based on previous lab results: A1c was 7.0 and cholesterol was elevated considering being diabetic. He is currently not taking any medication for diabetes, but taking the following medications for hyperlipidemia: Atorvastatin  80mg  daily, Ezetimibe  10mg  daily, and Fenofibrate  48 mg daily. He is accompanied with his spouse.   He reports he has been told previously that he was prediabetic. Patient reports he does eat a lot of sweets, such as chips, ice cream. He reports he participates in exercise in his yard during the summer months. Denies any movement during the winter.  ROS See HPI above     Objective:   BP 118/78   Pulse 75   Temp 97.9 F (36.6 C) (Oral)   Ht 5' 7 (1.702 m)   Wt 184 lb (83.5 kg)   SpO2 93%   BMI 28.82 kg/m  Wt Readings from Last 3 Encounters:  06/16/24 184 lb (83.5 kg)  05/20/24 182 lb (82.6 kg)  01/21/24 179 lb 6.4 oz (81.4 kg)     Physical Exam Vitals reviewed.  Constitutional:      General: He is not in acute distress.    Appearance: Normal appearance. He is not ill-appearing, toxic-appearing or diaphoretic.  HENT:     Head: Normocephalic and atraumatic.  Eyes:     General:        Right eye: No discharge.        Left eye: No discharge.     Conjunctiva/sclera: Conjunctivae normal.  Cardiovascular:     Rate and Rhythm: Normal rate.  Pulmonary:     Effort: Pulmonary effort is normal. No respiratory distress.  Musculoskeletal:        General: Normal range of motion.  Skin:    General: Skin is warm and dry.  Neurological:     General: No focal deficit present.     Mental Status: He is alert and oriented to person, place, and time. Mental status is at baseline.  Psychiatric:        Mood and  Affect: Mood normal.        Behavior: Behavior normal.        Thought Content: Thought content normal.        Judgment: Judgment normal.     Assessment & Plan:  Type 2 diabetes mellitus without complication, without long-term current use of insulin ([name]) -     Empagliflozin; Take 1 tablet (10 mg total) by mouth daily before breakfast.  Dispense: 90 tablet; Refill: 1  -Discussed in detailed about diabetes, the affects of diabetes on the body, several medication options, importance of eating and participating in regular exercise.  -Through shared decision making, decided on Jardiance 10mg  daily. Discussed about taking medication and side effects of medication.  -Provided information about diabetes-exercise, diabetes-foot care, diabetes-nutrition, diabetes-self care, and about the medication.   Return in about 3 months (around 09/16/2024) for chronic management.   Derrion Tritz, NP

## 2024-06-21 ENCOUNTER — Telehealth: Payer: Self-pay

## 2024-06-21 DIAGNOSIS — E119 Type 2 diabetes mellitus without complications: Secondary | ICD-10-CM

## 2024-06-21 NOTE — Telephone Encounter (Signed)
 Copied from CRM [id number]. Topic: Clinical - Medication Question >> Jun 21, 2024  1:23 PM [place] E wrote: Reason for CRM: Patient was recently prescribed empagliflozin  (JARDIANCE ) 10 MG TABS tablet which he would be paying roughly $300 every 3 months for this medication, patient questioning PCP has any other alternatives that would be cheaper.

## 2024-06-22 ENCOUNTER — Encounter: Payer: Self-pay | Admitting: Adult Health

## 2024-06-22 ENCOUNTER — Encounter: Payer: Self-pay | Admitting: Family Medicine

## 2024-06-22 ENCOUNTER — Ambulatory Visit: Admitting: Adult Health

## 2024-06-22 DIAGNOSIS — F17210 Nicotine dependence, cigarettes, uncomplicated: Secondary | ICD-10-CM

## 2024-06-22 MED ORDER — METFORMIN HCL ER 500 MG PO TB24: 500.0000 mg | ORAL_TABLET | Freq: Every day | ORAL | 0 refills | Status: DC

## 2024-06-22 NOTE — Addendum Note (Signed)
 Addended by: ELNER NANNY B on: 06/22/2024 03:08 PM   Modules accepted: Orders

## 2024-06-22 NOTE — Progress Notes (Signed)
  Virtual Visit via Telephone Note  I connected with SHAHIR KAREN , 06/22/24 10:17 AM by a telemedicine application and verified that I am speaking with the correct person using two identifiers.  Location: Patient: home Provider: home   I discussed the limitations of evaluation and management by telemedicine and the availability of in person appointments. The patient expressed understanding and agreed to proceed.   Shared Decision Making Visit Lung Cancer Screening Program [phone])   Eligibility: 68 y.o. Pack Years Smoking History Calculation = 50 pack years  (# packs/per year x # years smoked) Recent History of coughing up blood  no Unexplained weight loss? no ( >Than 15 pounds within the last 6 months ) Prior History Lung / other cancer no (Diagnosis within the last 5 years already requiring surveillance chest CT Scans). Smoking Status Current Smoker  Visit Components: Discussion included one or more decision making aids. YES Discussion included risk/benefits of screening. YES Discussion included potential follow up diagnostic testing for abnormal scans. YES Discussion included meaning and risk of over diagnosis. YES Discussion included meaning and risk of False Positives. YES Discussion included meaning of total radiation exposure. YES  Counseling Included: Importance of adherence to annual lung cancer LDCT screening. YES Impact of comorbidities on ability to participate in the program. YES Ability and willingness to under diagnostic treatment. YES  Smoking Cessation Counseling: Current Smokers:  Discussed importance of smoking cessation. yes Information about tobacco cessation classes and interventions provided to patient. yes Patient provided with ticket for LDCT Scan. yes Symptomatic Patient. NO Diagnosis Code: Tobacco Use Z72.0 Asymptomatic Patient yes  Counseling - 4 minutes of smoking cessation counseling  Z12.2-Screening of respiratory  organs Z87.891-Personal history of nicotine dependence   Lamarr Myers 06/22/24

## 2024-06-22 NOTE — Patient Instructions (Signed)

## 2024-06-28 NOTE — Progress Notes (Deleted)
 Cardiology Office Note    Patient Name: Derrick [name] Date of Encounter: 06/28/2024  Primary Care Provider:  [name], NP Primary Cardiologist:  [name], Jackee Shove, NP Primary Electrophysiologist: None   Past Medical History    Past Medical History:  Diagnosis Date   Anxiety    Bipolar 1 disorder [facility])    Bipolar disorder ([name]) 10/05/2009   Qualifier: Diagnosis of  By: Jaramillo, Luz     BPH (benign prostatic hypertrophy)    CAD (coronary artery disease), native coronary artery    a. DES x 2 to RCA in 2004 b. 02/11/17 PCI w/DES x1 to mRCA   Coronary atherosclerosis 10/05/2009   Qualifier: Diagnosis of  By: Justina Kos   ANGIOGRAPHIC DATA:  1. Left main coronary artery was free of critical disease.  2. The LAD coursed to the apex. There is mild luminal irregularity  including mild irregularity of the diagonal. No high grade areas of  stenosis were noted. There was faint collateralization of the distal  right coronary circulation.  3. The circumflex provided two major m   Depression    Dyspnea 09/20/2012   Essential and other specified forms of tremor 02/05/2013   HYPERCHOLESTEROLEMIA 10/05/2009   Qualifier: Diagnosis of  By: Justina Kos     Hyperlipidemia    Memory loss 02/05/2013   Mitral regurgitation and aortic stenosis    MITRAL STENOSIS/ INSUFFICIENCY, NON-RHEUMATIC 01/06/2011   Qualifier: Diagnosis of  By: Morris, MD, CODY Debby Lenis Study Conclusions  - Left ventricle: The cavity size was normal. Wall thickness was increased in a pattern of mild LVH. Systolic function was normal. The estimated ejection fraction was in the range of 55% to 60%. Wall motion was normal; there were no regional wall motion abnormalities. Left ventricular diastolic function parameters were   MVP (mitral valve prolapse)    Myocardial infarction ([name]) 2012   Obstructive sleep apnea 02/05/2013   OSA (obstructive sleep apnea) 03/22/2013   Parasomnia 02/05/2013   Sleep apnea     Sleep disorder with cognitive complaints 10/26/2018   Subdural hematoma, post-traumatic ([name])    fell and hit his head on ice and urgent care and pcp but did not tell them he had fallen and hit his head.   Unspecified hereditary and idiopathic peripheral neuropathy 02/05/2013    History of Present Illness  Derrick [name]  is a 68 year old male with a PMH of CAD s/p STEMI 2004 with DES x 2 to RCA, ICM, mild to moderate MR, MVP, tobacco abuse, bipolar disorder, OSA (on CPAP), HLD presents today for annual follow-up.  Derrick [name] was last seen on 04/22/2023 for annual follow-up.  During visit he reported doing well with no new cardiac complaints.  He was experiencing some fatigue due to Seroquel  which was previously uptitrated.  He was advised to complete repeat echo for MR in 2025 which will need to be scheduled.  He was still struggling with smoking and was down to 4 cigarettes/day.  He had lipids checked and were not at goal with fenofibrate  48 mg added.   Patient denies chest pain, palpitations, dyspnea, PND, orthopnea, nausea, vomiting, dizziness, syncope, edema, weight gain, or early satiety.   Discussed the use of AI scribe software for clinical note transcription with the patient, who gave verbal consent to proceed.  History of Present Illness    ***Notes: Needs repeat echo   Review of Systems  Please see the history of present illness.  All other systems reviewed and are otherwise negative except as noted above.  Physical Exam    Wt Readings from Last 3 Encounters:  06/16/24 184 lb (83.5 kg)  05/20/24 182 lb (82.6 kg)  01/21/24 179 lb 6.4 oz (81.4 kg)   CD:Uyzmz were no vitals filed for this visit.,There is no height or weight on file to calculate BMI. GEN: Well nourished, well developed in no acute distress Neck: No JVD; No carotid bruits Pulmonary: Clear to auscultation without rales, wheezing or rhonchi  Cardiovascular: Normal rate. Regular rhythm. Normal S1. Normal  S2.   Murmurs: There is no murmur.  ABDOMEN: Soft, non-tender, non-distended EXTREMITIES:  No edema; No deformity   EKG/LABS/ Recent Cardiac Studies   ECG personally reviewed by me today - ***  Risk Assessment/Calculations:   {Does this patient have ATRIAL FIBRILLATION?:[id number]}      Lab Results  Component Value Date   WBC 6.2 05/20/2024   HGB 13.7 05/20/2024   HCT 40.6 05/20/2024   MCV 95.8 05/20/2024   PLT 174 05/20/2024   Lab Results  Component Value Date   CREATININE 0.97 05/20/2024   BUN 11 05/20/2024   NA 137 05/20/2024   K 4.8 05/20/2024   CL 102 05/20/2024   CO2 27 05/20/2024   Lab Results  Component Value Date   CHOL 149 05/20/2024   HDL 39 (L) 05/20/2024   LDLCALC 91 05/20/2024   TRIG 101 05/20/2024   CHOLHDL 3.8 05/20/2024    Lab Results  Component Value Date   HGBA1C 7.0 (H) 05/20/2024   Assessment & Plan    Assessment and Plan Assessment & Plan     1.Coronary artery disease: -s/p inferior STEMI with DES x 2 to RCA in 2004  -Current GDMT with 81 mg aspirin , Lipitor  80 mg daily, Imdur  30 mg daily and metoprolol  25 mg daily  2.  Essential hypertension  3.  MVP  4.  Hyperlipidemia  5.  Tobacco abuse      Disposition: Follow-up with [name], Jackee Shove, NP or APP in *** months {Are you ordering a CV Procedure (e.g. stress test, cath, DCCV, TEE, etc)?   Press F2        :[id number]}   Signed, [name], Jackee Shove, NP 06/28/2024, 12:12 PM [facility]

## 2024-06-29 ENCOUNTER — Ambulatory Visit: Admitting: Nurse Practitioner

## 2024-06-29 DIAGNOSIS — F17200 Nicotine dependence, unspecified, uncomplicated: Secondary | ICD-10-CM

## 2024-06-29 DIAGNOSIS — I10 Essential (primary) hypertension: Secondary | ICD-10-CM

## 2024-06-29 DIAGNOSIS — I341 Nonrheumatic mitral (valve) prolapse: Secondary | ICD-10-CM

## 2024-06-29 DIAGNOSIS — I25118 Atherosclerotic heart disease of native coronary artery with other forms of angina pectoris: Secondary | ICD-10-CM

## 2024-06-29 DIAGNOSIS — E7800 Pure hypercholesterolemia, unspecified: Secondary | ICD-10-CM

## 2024-06-30 ENCOUNTER — Other Ambulatory Visit: Payer: Self-pay | Admitting: Nurse Practitioner

## 2024-07-04 ENCOUNTER — Other Ambulatory Visit: Payer: Self-pay | Admitting: Nurse Practitioner

## 2024-07-04 ENCOUNTER — Other Ambulatory Visit: Payer: Self-pay | Admitting: Physician Assistant

## 2024-07-08 ENCOUNTER — Other Ambulatory Visit: Payer: Self-pay

## 2024-07-08 ENCOUNTER — Telehealth: Payer: Self-pay | Admitting: Physician Assistant

## 2024-07-08 NOTE — Telephone Encounter (Signed)
 Pended.

## 2024-07-08 NOTE — Telephone Encounter (Signed)
 Pt called in req RF on Xanax . States he doesn't take it that often but would like another script.     [facility]   Next Appt 8/7

## 2024-07-11 MED ORDER — ALPRAZOLAM 0.5 MG PO TABS: 0.2500 mg | ORAL_TABLET | Freq: Two times a day (BID) | ORAL | 1 refills | Status: DC | PRN

## 2024-07-12 ENCOUNTER — Ambulatory Visit (HOSPITAL_COMMUNITY): Admission: RE | Admit: 2024-07-12 | Source: Ambulatory Visit

## 2024-07-13 ENCOUNTER — Ambulatory Visit: Admitting: Psychiatry

## 2024-07-20 ENCOUNTER — Other Ambulatory Visit: Payer: Self-pay

## 2024-07-21 ENCOUNTER — Ambulatory Visit (INDEPENDENT_AMBULATORY_CARE_PROVIDER_SITE_OTHER): Payer: Self-pay | Admitting: Physician Assistant

## 2024-07-21 DIAGNOSIS — Z91199 Patient's noncompliance with other medical treatment and regimen due to unspecified reason: Secondary | ICD-10-CM

## 2024-07-21 NOTE — Progress Notes (Signed)
 No show

## 2024-07-26 ENCOUNTER — Other Ambulatory Visit: Payer: Self-pay | Admitting: Family Medicine

## 2024-07-26 DIAGNOSIS — E119 Type 2 diabetes mellitus without complications: Secondary | ICD-10-CM

## 2024-07-26 MED ORDER — METFORMIN HCL ER 500 MG PO TB24: 500.0000 mg | ORAL_TABLET | Freq: Every day | ORAL | 0 refills | Status: DC

## 2024-07-26 NOTE — Telephone Encounter (Signed)
 Copied from CRM [id number]. Topic: Clinical - Medication Refill >> Jul 26, 2024 12:46 PM Rosina BIRCH wrote: Medication: metformin  [facility] is requesting the medication because they do not have it  Has the patient contacted their pharmacy? No (Agent: If no, request that the patient contact the pharmacy for the refill. If patient does not wish to contact the pharmacy document the reason why and proceed with request.) (Agent: If yes, when and what did the pharmacy advise?)  This is the patient's preferred pharmacy:   [facility] - [place], [place] - [address] [place] [address] Phone: [phone] Fax: [phone]  Is this the correct pharmacy for this prescription? Yes If no, delete pharmacy and type the correct one.   Has the prescription been filled recently? No  Is the patient out of the medication? Yes  Has the patient been seen for an appointment in the last year OR does the patient have an upcoming appointment? Yes  Can we respond through MyChart? Yes  Agent: Please be advised that Rx refills may take up to 3 business days. We ask that you follow-up with your pharmacy.

## 2024-07-28 ENCOUNTER — Other Ambulatory Visit: Payer: Self-pay

## 2024-07-29 ENCOUNTER — Other Ambulatory Visit: Payer: Self-pay | Admitting: Nurse Practitioner

## 2024-08-11 ENCOUNTER — Ambulatory Visit (INDEPENDENT_AMBULATORY_CARE_PROVIDER_SITE_OTHER): Admitting: Psychiatry

## 2024-08-11 DIAGNOSIS — F1991 Other psychoactive substance use, unspecified, in remission: Secondary | ICD-10-CM

## 2024-08-11 DIAGNOSIS — F902 Attention-deficit hyperactivity disorder, combined type: Secondary | ICD-10-CM

## 2024-08-11 DIAGNOSIS — F411 Generalized anxiety disorder: Secondary | ICD-10-CM

## 2024-08-11 DIAGNOSIS — F319 Bipolar disorder, unspecified: Secondary | ICD-10-CM

## 2024-08-11 DIAGNOSIS — F068 Other specified mental disorders due to known physiological condition: Secondary | ICD-10-CM | POA: Diagnosis not present
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
## 2024-08-12 ENCOUNTER — Ambulatory Visit: Admitting: Physician Assistant

## 2024-08-22 ENCOUNTER — Other Ambulatory Visit: Payer: Self-pay | Admitting: Nurse Practitioner

## 2024-08-31 ENCOUNTER — Ambulatory Visit (HOSPITAL_COMMUNITY)
Admission: RE | Admit: 2024-08-31 | Discharge: 2024-08-31 | Disposition: A | Discharge status: Home / Self Care | Source: Ambulatory Visit | Attending: Acute Care | Admitting: Acute Care

## 2024-08-31 DIAGNOSIS — Z87891 Personal history of nicotine dependence: Secondary | ICD-10-CM | POA: Insufficient documentation

## 2024-08-31 DIAGNOSIS — F17210 Nicotine dependence, cigarettes, uncomplicated: Secondary | ICD-10-CM | POA: Insufficient documentation

## 2024-08-31 DIAGNOSIS — Z122 Encounter for screening for malignant neoplasm of respiratory organs: Secondary | ICD-10-CM | POA: Diagnosis not present

## 2024-09-07 ENCOUNTER — Other Ambulatory Visit: Payer: Self-pay

## 2024-09-07 ENCOUNTER — Other Ambulatory Visit: Payer: Self-pay | Admitting: Nurse Practitioner

## 2024-09-07 DIAGNOSIS — F17210 Nicotine dependence, cigarettes, uncomplicated: Secondary | ICD-10-CM

## 2024-09-07 DIAGNOSIS — Z87891 Personal history of nicotine dependence: Secondary | ICD-10-CM

## 2024-09-07 DIAGNOSIS — Z122 Encounter for screening for malignant neoplasm of respiratory organs: Secondary | ICD-10-CM

## 2024-09-15 ENCOUNTER — Other Ambulatory Visit: Payer: Self-pay | Admitting: Nurse Practitioner

## 2024-09-16 ENCOUNTER — Other Ambulatory Visit: Payer: Self-pay | Admitting: Nurse Practitioner

## 2024-09-16 ENCOUNTER — Ambulatory Visit: Admitting: Family Medicine

## 2024-09-16 ENCOUNTER — Encounter: Payer: Self-pay | Admitting: Family Medicine

## 2024-09-16 ENCOUNTER — Other Ambulatory Visit: Payer: Self-pay | Admitting: Physician Assistant

## 2024-09-16 VITALS — BP 116/82 | HR 80 | Temp 98.0°F | Ht 67.0 in | Wt 176.0 lb

## 2024-09-16 DIAGNOSIS — K635 Polyp of colon: Secondary | ICD-10-CM | POA: Diagnosis not present

## 2024-09-16 DIAGNOSIS — Z1211 Encounter for screening for malignant neoplasm of colon: Secondary | ICD-10-CM

## 2024-09-16 DIAGNOSIS — J309 Allergic rhinitis, unspecified: Secondary | ICD-10-CM | POA: Diagnosis not present

## 2024-09-16 DIAGNOSIS — E119 Type 2 diabetes mellitus without complications: Secondary | ICD-10-CM

## 2024-09-16 DIAGNOSIS — Z7984 Long term (current) use of oral hypoglycemic drugs: Secondary | ICD-10-CM

## 2024-09-16 DIAGNOSIS — Z23 Encounter for immunization: Secondary | ICD-10-CM | POA: Diagnosis not present

## 2024-09-16 LAB — COMPREHENSIVE METABOLIC PANEL WITH GFR
ALT: 23 U/L (ref 0–53)
AST: 24 U/L (ref 0–37)
Albumin: 4.3 g/dL (ref 3.5–5.2)
Alkaline Phosphatase: 41 U/L (ref 39–117)
BUN: 10 mg/dL (ref 6–23)
CO2: 30 meq/L (ref 19–32)
Calcium: 9.9 mg/dL (ref 8.4–10.5)
Chloride: 100 meq/L (ref 96–112)
Creatinine, Ser: 1 mg/dL (ref 0.40–1.50)
GFR: 78.33 mL/min (ref >60.00)
Glucose, Bld: 98 mg/dL (ref 70–99)
Potassium: 4.4 meq/L (ref 3.5–5.1)
Sodium: 136 meq/L (ref 135–145)
Total Bilirubin: 0.5 mg/dL (ref 0.2–1.2)
Total Protein: 6.7 g/dL (ref 6.0–8.3)

## 2024-09-16 LAB — HEMOGLOBIN A1C: Hgb A1c MFr Bld: 6.9 % — ABNORMAL HIGH (ref 4.6–6.5)

## 2024-09-16 MED ORDER — MONTELUKAST SODIUM 10 MG PO TABS: 10.0000 mg | ORAL_TABLET | Freq: Every day | ORAL | 3 refills | Status: AC

## 2024-09-16 MED ORDER — METFORMIN HCL ER 500 MG PO TB24
500.0000 mg | ORAL_TABLET | Freq: Every day | ORAL | 1 refills | Status: DC
Start: 2024-09-16 — End: 2024-09-20

## 2024-09-16 NOTE — Assessment & Plan Note (Addendum)
 Continue Metformin  XR 500mg  daily. Will take Jardiance  10mg  daily for 90 days at the first day. Foot exam completed. On a statin, but on an ACE or ARB. Ordered A1c, microalbumin/creatinine urine, and CMP. Unknown ophthalmology.

## 2024-09-16 NOTE — Assessment & Plan Note (Signed)
 Continue with Montelukast  10 mg daily. Refilled.

## 2024-09-16 NOTE — Progress Notes (Signed)
 Established Patient Office Visit   Subjective:  Patient ID: Derrick [name], male    DOB: April 08, 1956  Age: 68 y.o. MRN: [id number]  Chief Complaint  Patient presents with   Medical Management of Chronic Issues    3 month follow up     HPI Diabetes: Patient was started on Jardiance  10mg  daily 3 months ago for new onset diabetes, A1c was 7.0. He reports it was expensive, but was able to get 90 tablets, just not took them. He is planning to take the 90 day supply at the first of the year. Instead started Metformin  XR 500mg  daily.  He already takes Atorvastatin  80mg , Fenofibrate  48mg  daily, and and Ezetimibe  10mg  for cholesterol with cardiology.   Allergic Rhinitis: Patient is taking Montelukast  10mg  daily. He reports medication is effective.   All other medications managed by cardiology and psychiatry.  ROS See HPI above     Objective:   BP 116/82   Pulse 80   Temp 98 F (36.7 C) (Oral)   Ht 5' 7 (1.702 m)   Wt 176 lb (79.8 kg)   SpO2 96%   BMI 27.57 kg/m  Wt Readings from Last 3 Encounters:  09/16/24 176 lb (79.8 kg)  06/16/24 184 lb (83.5 kg)  05/20/24 182 lb (82.6 kg)      Physical Exam Vitals reviewed.  Constitutional:      General: He is not in acute distress.    Appearance: Normal appearance. He is not ill-appearing, toxic-appearing or diaphoretic.  HENT:     Head: Normocephalic and atraumatic.  Eyes:     General:        Right eye: No discharge.        Left eye: No discharge.     Conjunctiva/sclera: Conjunctivae normal.  Cardiovascular:     Rate and Rhythm: Normal rate and regular rhythm.     Pulses:          Dorsalis pedis pulses are 2+ on the right side and 2+ on the left side.     Heart sounds: Normal heart sounds. No murmur heard.    No friction rub. No gallop.  Pulmonary:     Effort: Pulmonary effort is normal. No respiratory distress.     Breath sounds: Normal breath sounds.  Musculoskeletal:        General: Normal range of motion.  Feet:      Right foot:     Protective Sensation: 10 sites tested.  10 sites sensed.     Skin integrity: Callus and dry skin present.     Toenail Condition: Right toenails are abnormally thick and long.     Left foot:     Protective Sensation: 10 sites tested.  10 sites sensed.     Skin integrity: Callus and dry skin present.     Toenail Condition: Left toenails are abnormally thick and long.  Skin:    General: Skin is warm and dry.  Neurological:     General: No focal deficit present.     Mental Status: He is alert and oriented to person, place, and time. Mental status is at baseline.  Psychiatric:        Mood and Affect: Mood normal.        Behavior: Behavior normal.        Thought Content: Thought content normal.        Judgment: Judgment normal.      Assessment & Plan:  Type 2 diabetes mellitus without complication, without long-term  current use of insulin ([name]) Assessment & Plan: Continue Metformin  XR 500mg  daily. Will take Jardiance  10mg  daily for 90 days at the first day. Foot exam completed. On a statin, but on an ACE or ARB. Ordered A1c, microalbumin/creatinine urine, and CMP. Unknown ophthalmology.   Orders: -     Comprehensive metabolic panel with GFR -     Hemoglobin A1c -     Microalbumin / creatinine urine ratio -     metFORMIN  HCl ER; Take 1 tablet (500 mg total) by mouth at bedtime.  Dispense: 90 tablet; Refill: 1  Chronic allergic rhinitis Assessment & Plan: Continue with Montelukast  10 mg daily. Refilled.   Orders: -     Montelukast  Sodium; Take 1 tablet (10 mg total) by mouth daily.  Dispense: 90 tablet; Refill: 3  Immunization due -     Flu vaccine HIGH DOSE PF(Fluzone Trivalent)  Colon cancer screening -     Ambulatory referral to Gastroenterology  Polyp of colon, unspecified part of colon, unspecified type -     Ambulatory referral to Gastroenterology  1.Review health maintenance: -Colonoscopy: Placed a referral  -Covid booster: Will obtain at  pharmacy -Influenza: Administered  -AWV: scheduled with Dr. Luke 12/06/2024  -PNA vaccine: Not has  Return in about 3 months (around 12/17/2024) for chronic management.   Maynor Mwangi, NP

## 2024-09-16 NOTE — Patient Instructions (Addendum)
-  It was good to see you today.  -Placed a referral to GI for colonoscopy. Please call the office or send a MyChart message if you do not receive a phone call or a MyChart message about appointment in 2 weeks.  -Influenza vaccine administered. -Continue all medications. Refilled Metformin  and Montelukast .  -Ordered labs and urine for diabetes. Office will call with results and will be available via MyChart.  -Follow up in 3 months.

## 2024-09-19 ENCOUNTER — Ambulatory Visit: Admitting: Physician Assistant

## 2024-09-20 ENCOUNTER — Ambulatory Visit: Payer: Self-pay | Admitting: Family Medicine

## 2024-09-20 ENCOUNTER — Other Ambulatory Visit: Payer: Self-pay | Admitting: Family Medicine

## 2024-09-20 DIAGNOSIS — E119 Type 2 diabetes mellitus without complications: Secondary | ICD-10-CM

## 2024-09-20 MED ORDER — METFORMIN HCL ER 500 MG PO TB24: 500.0000 mg | ORAL_TABLET | Freq: Two times a day (BID) | ORAL | 2 refills | Status: AC

## 2024-09-21 LAB — MICROALBUMIN / CREATININE URINE RATIO
Creatinine,U: 74.6 mg/dL
Microalb Creat Ratio: 11.2 mg/g (ref 0.0–30.0)
Microalb, Ur: 0.8 mg/dL (ref 0.0–1.9)

## 2024-09-28 NOTE — Progress Notes (Unsigned)
 Cardiology Office Note   Date:  10/04/2024  ID:  Derrick [name], Derrick [name] [birth year], MRN [id number] PCP: Derrick Philippe SAUNDERS, NP  [facility] Providers Cardiologist:  Derrick [name], Derrick Shove, NP     History of Present Illness Derrick [name] is a 68 y.o. male with a past history of CAD s/p STEMI with DES to RCA in 2004, HFrEF, mitral valve disease, hypertension, OSA on CPAP, DM2, history of subdural hematoma, bipolar disorder, dyslipidemia, tobacco abuse.  07/16/2021 echo EF 55 to 60%, mild LVH, mild MR 09/22/2018 echo EF 50 to 55%, grade 1 DD, mild MR 10/23/2017 cardiac MRI mild to moderate MR, delayed enhancement imaging consistent with prior infarction in the RCA 08/28/2017 Myoview  EF 37%, findings consistent with prior MI, no ischemia 02/12/2017 cardiac cath severe in-stent stenosis >> PCI and DES 2004 overlapping DES x 2 to RCA  In 2004 he suffered a STEMI requiring overlapping DES to his RCA.  In 2018, he developed severe in-stent stenosis requiring PCI with DES to his RCA.  He had a Myoview  that same year with an EF of 37%.  A cardiac MRI was then arranged revealing mild to moderate MR, delayed enhancement imaging consistent with prior infarction in the RCA.  Most recently was evaluated by Derrick Wyn, NP on 04/22/2023, he had no official cardiac complaints but was bothered by fatigue, Seroquel  had apparently recently been up titrated, no changes made to medications or plan of care he was advised he can follow-up in a year.  He presents today for follow-up of his coronary artery disease, he does not have any formal complaints from this perspective.  He follows closely with his PCP and also has mental health specialist.  He stays active around his house but admits he is not participating in any formal exercise recently, he does mention that he was recently washing his car, weed eating around his home and did so without any episodes of angina. He denies chest pain, palpitations, dyspnea, pnd,  orthopnea, n, v, dizziness, syncope, edema, weight gain, or early satiety.    ROS: Review of Systems  All other systems reviewed and are negative.    Studies Reviewed      Cardiac Studies & Procedures   ______________________________________________________________________________________________ CARDIAC CATHETERIZATION  CARDIAC CATHETERIZATION 02/12/2017  Conclusion  Mid RCA lesion, 90 %stenosed.  A STENT RESOLUTE ONYX 4.0X12 drug eluting stent was successfully placed.  Prox RCA to Mid RCA lesion, 90 %stenosed.  Post intervention, there is a 0% residual stenosis.  Successful stenting of the mid RCA with DES for in stent restenosis.  Plan: DAPT for one year with ASA and Plavix . May be suitable for same day discharge.  Findings Coronary Findings Diagnostic  Dominance: Right  Right Coronary Artery The lesion was previously treated using a drug eluting stent .  Intervention  Prox RCA to Mid RCA lesion Angioplasty (Also treats lesions: Mid RCA) Lesion crossed with guidewire using a WIRE ASAHI PROWATER 180CM. Pre-stent angioplasty was performed using a BALLOON EMERGE MR 3.0X12. Maximum pressure: 14 atm. A STENT RESOLUTE ONYX 4.0X12 drug eluting stent was successfully placed. Stent strut is well apposed. Post-stent angioplasty was performed using a BALLOON Derrick [name] EMERGE MR 4.0X8. Maximum pressure: 20 atm. The pre-interventional distal flow is normal (TIMI 3).  The post-interventional distal flow is normal (TIMI 3). The intervention was successful . No complications occurred at this lesion. There is a 0% residual stenosis post intervention.  Mid RCA lesion Angioplasty (Also treats lesions: Prox RCA to  Mid RCA) See details in Prox RCA to Mid RCA lesion. There is a 0% residual stenosis post intervention.   CARDIAC CATHETERIZATION 02/12/2017  Conclusion 95% in-stent restenosis in mid RCA, likely explains fall in EF and Cardiolite  abnormality.  Plan for PCI today with Dr.  [place].  Findings Coronary Findings Diagnostic  Dominance: Right  Left Main No significant disease.  Left Anterior Descending No significant disease.  Left Circumflex Long OM1, small OM2.  No significant disease in LCx system.  Right Coronary Artery Long stented segment of proximal to mid RCA.  Serial 30% in-stent restenoses in the proximal RCA with 95% in-stent restenosis in the mid RCA.  There are weak left to right collaterals.  Intervention  No interventions have been documented.   STRESS TESTS  MYOCARDIAL PERFUSION IMAGING 08/28/2017  Interpretation Summary  Nuclear stress EF: 37%.  Blood pressure demonstrated a normal response to exercise.  There was no ST segment deviation noted during stress.  No T wave inversion was noted during stress.  Defect 1: There is a small defect of moderate severity present in the basal inferior and mid inferior location.  Findings consistent with prior myocardial infarction. No ischemia.  The left ventricular ejection fraction is moderately decreased (30-44%).   ECHOCARDIOGRAM  ECHOCARDIOGRAM COMPLETE 07/16/2021  Narrative ECHOCARDIOGRAM REPORT    Patient Name:   Derrick [name] Date of Exam: 07/16/2021 Medical Rec #:  [id number]      Height:       71.0 in Accession #:    [id number]     Weight:       172.0 lb Date of Birth:  Sep 08, 1956      BSA:          1.978 m Patient Age:    65 years       BP:           111/71 mmHg Patient Gender: M              HR:           80 bpm. Exam Location:  Derrick [name]  Procedure: 2D Echo, Cardiac Doppler and Color Doppler  Indications:    I34.1 (ICD-10-CM) - Mitral valve prolapse  History:        Patient has prior history of Echocardiogram examinations, most recent 09/22/2018. CAD and Previous Myocardial Infarction, Mitral Valve Prolapse; Risk Factors:Former Smoker, Dyslipidemia and Family History of Coronary Artery Disease. OSA (obstructive sleep apnea).  Sonographer:    Derrick [name]  Derrick [name] Referring Phys: [id number] Derrick [name]  IMPRESSIONS   1. Left ventricular ejection fraction, by estimation, is 55 to 60%. The left ventricle has normal function. The left ventricle has no regional wall motion abnormalities. There is mild left ventricular hypertrophy. Left ventricular diastolic parameters are indeterminate. 2. Right ventricular systolic function is normal. The right ventricular size is normal. Tricuspid regurgitation signal is inadequate for assessing PA pressure. 3. The mitral valve is abnormal, mildly thickened and with prolapse predominantly of the posterior leaflet. Mild mitral valve regurgitation made up of two small jets. 4. The aortic valve is tricuspid. Aortic valve regurgitation is not visualized. 5. The inferior vena cava is normal in size with greater than 50% respiratory variability, suggesting right atrial pressure of 3 mmHg.  Comparison(s): Prior images reviewed side by side. Mitral regurgitation has not progressed.  FINDINGS Left Ventricle: Left ventricular ejection fraction, by estimation, is 55 to 60%. The left ventricle has normal function. The left ventricle has no regional wall motion  abnormalities. The left ventricular internal cavity size was normal in size. There is mild left ventricular hypertrophy. Left ventricular diastolic parameters are indeterminate.  Right Ventricle: The right ventricular size is normal. No increase in right ventricular wall thickness. Right ventricular systolic function is normal. Tricuspid regurgitation signal is inadequate for assessing PA pressure.  Left Atrium: Left atrial size was normal in size.  Right Atrium: Right atrial size was normal in size.  Pericardium: There is no evidence of pericardial effusion.  Mitral Valve: The mitral valve is abnormal. There is mild thickening of the mitral valve leaflet(s). Mild mitral annular calcification. Mild mitral valve regurgitation.  Tricuspid Valve: The tricuspid valve  is grossly normal. Tricuspid valve regurgitation is trivial.  Aortic Valve: The aortic valve is tricuspid. Aortic valve regurgitation is not visualized.  Pulmonic Valve: The pulmonic valve was grossly normal. Pulmonic valve regurgitation is trivial.  Aorta: The aortic root is normal in size and structure.  Venous: The inferior vena cava is normal in size with greater than 50% respiratory variability, suggesting right atrial pressure of 3 mmHg.  IAS/Shunts: No atrial level shunt detected by color flow Doppler.   LEFT VENTRICLE PLAX 2D LVIDd:         4.60 cm  Diastology LVIDs:         3.20 cm  LV e' medial:    6.42 cm/s LV PW:         1.20 cm  LV E/e' medial:  8.6 LV IVS:        1.20 cm  LV e' lateral:   7.62 cm/s LVOT diam:     2.00 cm  LV E/e' lateral: 7.2 LV SV:         59 LV SV Index:   30 LVOT Area:     3.14 cm   RIGHT VENTRICLE RV S prime:     10.80 cm/s TAPSE (M-mode): 1.8 cm  LEFT ATRIUM             Index       RIGHT ATRIUM           Index LA diam:        3.80 cm 1.92 cm/m  RA Area:     19.30 cm LA Vol (A2C):   53.1 ml 26.85 ml/m RA Volume:   52.30 ml  26.45 ml/m LA Vol (A4C):   42.4 ml 21.44 ml/m LA Biplane Vol: 46.1 ml 23.31 ml/m AORTIC VALVE LVOT Vmax:   94.70 cm/s LVOT Vmean:  59.000 cm/s LVOT VTI:    0.187 m  AORTA Ao Root diam: 3.30 cm  MITRAL VALVE MV Area (PHT): 2.39 cm    SHUNTS MV Decel Time: 317 msec    Systemic VTI:  0.19 m MV E velocity: 55.10 cm/s  Systemic Diam: 2.00 cm MV A velocity: 76.80 cm/s MV E/A ratio:  0.72  Jayson Sierras MD Electronically signed by Jayson Sierras MD Signature Date/Time: 07/16/2021/3:38:39 PM    Final        CARDIAC MRI  MR CARDIAC MORPHOLOGY W WO CONTRAST 10/23/2017  Narrative CLINICAL DATA:  Ischemic cardiomyopathy  EXAM: CARDIAC MRI  TECHNIQUE: The patient was scanned on a 1.5 Tesla GE magnet. A dedicated cardiac coil was used. Functional imaging was done using Fiesta sequences. 2,3, and  4 chamber views were done to assess for RWMA's. Modified Simpson's rule using a short axis stack was used to calculate an ejection fraction on a dedicated work [employer]. The patient received 35  cc of Multihance . After 10 minutes inversion recovery sequences were used to assess for infiltration and scar tissue.  CONTRAST:  26 cc Multihance   FINDINGS: Limited images of the lung fields showed no significant abnormalities.  Normal left ventricular size and thickness. Hypokinetic basal inferior and basal inferoseptal walls. Akinetic mid inferior wall. LV EF 47%. Normal right ventricular size and systolic function. Normal left atrial size. Normal right atrial size. Mild mitral valve prolapse with mild-moderate mid to late systolic mitral regurgitation. Trileaflet aortic valve with no regurgitation or stenosis.  Delayed enhancement imaging showed 76-99% wall thickness subendocardial late gadolinium enhancement (LGE) in the mid inferior wall.  MEASUREMENTS: MEASUREMENTS LVEDV 141 mL  LVSV 66 mL  LVEF 47%  IMPRESSION: 1. Normal LV size with wall motion abnormalities noted above. EF 47%. Delayed enhancement images suggestive of prior infarction in the RCA territory.  2.  Normal RV size and systolic function.  3. Mitral valve prolapse with mild to moderate mitral regurgitation.  Dalton Mclean   Electronically Signed By: Ezra Shuck M.D. On: 10/23/2017 16:43   ______________________________________________________________________________________________      Risk Assessment/Calculations           Physical Exam VS:  BP 106/68   Pulse 64   Ht 5' 7 (1.702 m)   Wt 177 lb 6.4 oz (80.5 kg)   SpO2 96%   BMI 27.78 kg/m        Wt Readings from Last 3 Encounters:  10/04/24 177 lb 6.4 oz (80.5 kg)  09/16/24 176 lb (79.8 kg)  06/16/24 184 lb (83.5 kg)    GEN: Well nourished, well developed in no acute distress NECK: No JVD; No carotid  bruits CARDIAC: RRR, no murmurs, rubs, gallops RESPIRATORY:  Clear to auscultation without rales, wheezing or rhonchi  ABDOMEN: Soft, non-tender, non-distended EXTREMITIES:  No edema; No deformity   ASSESSMENT AND PLAN CAD -interventions as outlined above, Stable with no anginal symptoms. No indication for ischemic evaluation.  Continue aspirin  81 mg daily, Lipitor  80 mg daily, Zetia  10 mg daily, Tricor  48 mg daily, Imdur  15 mg daily, metoprolol  25 mg daily, nitroglycerin  as needed. Heart healthy diet and regular cardiovascular exercise encouraged.    HFimpEF - NYHA class I, euvolemic.  Continue Jardiance  10 mg daily, metoprolol  25 mg daily.  Blood pressure cannot tolerate further GDMT.  Dyslipidemia-this appears to be formally monitored by his PCP with most recent LDL 91, this is above goal at 70, he is currently on Lipitor  80 mg daily, Tricor  48 mg daily, Zetia  10 mg daily-this was checked while he was recently found to have diabetes and has made some changes, anticipate that this will hopefully return to goal.  If not, suggest that his Tricor  be increased or consider Nexlizet.   Bipolar disorder-symptoms are well-managed, EKG without any changes and QT is stable, currently on Lamictal , Lexapro , Seroquel , meds Depakote  and Xanax  as needed.  DM2 - newly diagnosed, returning to PCP in the next few weeks for repeat labs. Heart healthy diet and regular cardiovascular exercise encouraged.           Dispo:  Follow up in 1 year with Dr. Kriste.   Signed, Delon JAYSON Hoover, NP

## 2024-09-29 ENCOUNTER — Other Ambulatory Visit: Payer: Self-pay | Admitting: Nurse Practitioner

## 2024-10-04 ENCOUNTER — Encounter: Payer: Self-pay | Admitting: Cardiology

## 2024-10-04 ENCOUNTER — Ambulatory Visit: Attending: Cardiology | Admitting: Cardiology

## 2024-10-04 VITALS — BP 106/68 | HR 64 | Ht 67.0 in | Wt 177.4 lb

## 2024-10-04 DIAGNOSIS — E1165 Type 2 diabetes mellitus with hyperglycemia: Secondary | ICD-10-CM | POA: Diagnosis not present

## 2024-10-04 DIAGNOSIS — I10 Essential (primary) hypertension: Secondary | ICD-10-CM | POA: Diagnosis not present

## 2024-10-04 DIAGNOSIS — E782 Mixed hyperlipidemia: Secondary | ICD-10-CM

## 2024-10-04 DIAGNOSIS — I5020 Unspecified systolic (congestive) heart failure: Secondary | ICD-10-CM | POA: Diagnosis not present

## 2024-10-04 DIAGNOSIS — I2510 Atherosclerotic heart disease of native coronary artery without angina pectoris: Secondary | ICD-10-CM

## 2024-10-04 MED ORDER — NITROGLYCERIN 0.4 MG SL SUBL: 0.4000 mg | SUBLINGUAL_TABLET | SUBLINGUAL | 3 refills | Status: AC | PRN

## 2024-10-04 NOTE — Patient Instructions (Signed)
 Medication Instructions:  Your physician recommends that you continue on your current medications as directed. Please refer to the Current Medication list given to you today.  *If you need a refill on your cardiac medications before your next appointment, please call your pharmacy*  Lab Work: None ordered If you have labs (blood work) drawn today and your tests are completely normal, you will receive your results only by: MyChart Message (if you have MyChart) OR A paper copy in the mail If you have any lab test that is abnormal or we need to change your treatment, we will call you to review the results.  Testing/Procedures: None ordered  Follow-Up: At [facility], you and your health needs are our priority.  As part of our continuing mission to provide you with exceptional heart care, our providers are all part of one team.  This team includes your primary Cardiologist (physician) and Advanced Practice Providers or APPs (Physician Assistants and Nurse Practitioners) who all work together to provide you with the care you need, when you need it.  Your next appointment:   1 year(s)  Provider:   Emeline FORBES Calender, MD    We recommend signing up for the patient portal called MyChart.  Sign up information is provided on this After Visit Summary.  MyChart is used to connect with patients for Virtual Visits (Telemedicine).  Patients are able to view lab/test results, encounter notes, upcoming appointments, etc.  Non-urgent messages can be sent to your provider as well.   To learn more about what you can do with MyChart, go to [email or web address].

## 2024-10-05 ENCOUNTER — Ambulatory Visit (INDEPENDENT_AMBULATORY_CARE_PROVIDER_SITE_OTHER): Admitting: Psychiatry

## 2024-10-05 DIAGNOSIS — F319 Bipolar disorder, unspecified: Secondary | ICD-10-CM

## 2024-10-05 DIAGNOSIS — F411 Generalized anxiety disorder: Secondary | ICD-10-CM | POA: Diagnosis not present

## 2024-10-05 DIAGNOSIS — F1991 Other psychoactive substance use, unspecified, in remission: Secondary | ICD-10-CM | POA: Diagnosis not present

## 2024-10-05 DIAGNOSIS — F068 Other specified mental disorders due to known physiological condition: Secondary | ICD-10-CM
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
## 2024-10-10 ENCOUNTER — Other Ambulatory Visit: Payer: Self-pay | Admitting: Nurse Practitioner

## 2024-10-11 ENCOUNTER — Other Ambulatory Visit: Payer: Self-pay | Admitting: Nurse Practitioner

## 2024-10-13 ENCOUNTER — Ambulatory Visit: Admitting: Physician Assistant

## 2024-10-13 ENCOUNTER — Encounter: Payer: Self-pay | Admitting: Physician Assistant

## 2024-10-13 ENCOUNTER — Other Ambulatory Visit: Payer: Self-pay | Admitting: Physician Assistant

## 2024-10-13 DIAGNOSIS — F411 Generalized anxiety disorder: Secondary | ICD-10-CM

## 2024-10-13 DIAGNOSIS — F319 Bipolar disorder, unspecified: Secondary | ICD-10-CM

## 2024-10-13 MED ORDER — ALPRAZOLAM 0.5 MG PO TABS: 0.2500 mg | ORAL_TABLET | Freq: Two times a day (BID) | ORAL | 5 refills | Status: AC | PRN

## 2024-10-13 MED ORDER — DIVALPROEX SODIUM 500 MG PO DR TAB: 1500.0000 mg | DELAYED_RELEASE_TABLET | Freq: Every day | ORAL | 3 refills | Status: AC

## 2024-10-13 MED ORDER — LAMOTRIGINE 100 MG PO TABS: 100.0000 mg | ORAL_TABLET | Freq: Two times a day (BID) | ORAL | 3 refills | Status: AC

## 2024-10-13 MED ORDER — QUETIAPINE FUMARATE ER 300 MG PO TB24: 300.0000 mg | ORAL_TABLET | Freq: Every day | ORAL | 3 refills | Status: AC

## 2024-10-13 NOTE — Progress Notes (Signed)
 [facility]  Patient ID: Derrick [name],  MRN: [id number]  PCP: [name], NP  Date of Evaluation: 10/13/2024 Time spent:20 minutes  Chief Complaint:  Chief Complaint   Anxiety; Depression; Follow-up    HISTORY/CURRENT STATUS: HPI for routine med check.  Overall he's stable.  Has been needing more Xanax  d/t stressors with family issues. See ROS. Xanax  is effective.  Energy and motivation are good.   No extreme sadness, tearfulness, or feelings of hopelessness.  Sleeps good.  ADLs and personal hygiene are normal.   Denies any changes in concentration, making decisions, or remembering things.  Appetite has not changed.  Weight is stable.   No SI/HI.  Patient denies increased energy with decreased need for sleep, increased talkativeness, racing thoughts, impulsivity or risky behaviors, increased spending,  grandiosity, increased irritability or anger, paranoia, or hallucinations.  Individual Medical History/ Review of Systems: Changes? :No       Past medications for mental health diagnoses include: Zoloft, Prozac, Effexor XR, Lexapro , Depakote , Lamictal , Wellbutrin  Xanax , Ambien , Sonata, Klonopin, Seroquel   Allergies: Patient has no known allergies.  Current Medications:  Current Outpatient Medications:    aspirin  EC 81 MG tablet, Take 81 mg by mouth daily. Swallow whole., Disp: , Rfl:    atorvastatin  (LIPITOR ) 80 MG tablet, TAKE 1 TAB DAILY. KEEP SCHEDULED MD APPOINTMENT FOR REFILLS AND 90 DAY SUPPLY., Disp: 30 tablet, Rfl: 0   empagliflozin  (JARDIANCE ) 10 MG TABS tablet, Take 1 tablet (10 mg total) by mouth daily before breakfast., Disp: 90 tablet, Rfl: 1   escitalopram  (LEXAPRO ) 20 MG tablet, TAKE 1 TABLET EVERY DAY, Disp: 90 tablet, Rfl: 1   ezetimibe  (ZETIA ) 10 MG tablet, Take 1 tablet (10 mg total) by mouth daily., Disp: 90 tablet, Rfl: 1   fenofibrate  (TRICOR ) 48 MG tablet, Take 1 tablet (48 mg total) by mouth daily., Disp: 90 tablet, Rfl: 2    isosorbide  mononitrate (IMDUR ) 30 MG 24 hr tablet, TAKE 1/2 TABLET EVERY DAY (NEED MD APPOINTMENT), Disp: 45 tablet, Rfl: 3   metFORMIN  (GLUCOPHAGE -XR) 500 MG 24 hr tablet, Take 1 tablet (500 mg total) by mouth in the morning and at bedtime., Disp: 60 tablet, Rfl: 2   metoprolol  succinate (TOPROL -XL) 25 MG 24 hr tablet, Take 1 tablet (25 mg total) by mouth daily. Take with or immediately following a meal, Disp: 90 tablet, Rfl: 3   montelukast  (SINGULAIR ) 10 MG tablet, Take 1 tablet (10 mg total) by mouth daily., Disp: 90 tablet, Rfl: 3   nitroGLYCERIN  (NITROSTAT ) 0.4 MG SL tablet, Place 1 tablet (0.4 mg total) under the tongue every 5 (five) minutes as needed for chest pain., Disp: 25 tablet, Rfl: 3   pantoprazole  (PROTONIX ) 40 MG tablet, Take 1 tablet (40 mg total) by mouth daily., Disp: 90 tablet, Rfl: 3   ALPRAZolam  (XANAX ) 0.5 MG tablet, Take 0.5-1 tablets (0.25-0.5 mg total) by mouth 2 (two) times daily as needed for anxiety., Disp: 60 tablet, Rfl: 5   divalproex  (DEPAKOTE ) 500 MG DR tablet, Take 3 tablets (1,500 mg total) by mouth at bedtime., Disp: 270 tablet, Rfl: 3   lamoTRIgine  (LAMICTAL ) 100 MG tablet, Take 1 tablet (100 mg total) by mouth 2 (two) times daily., Disp: 180 tablet, Rfl: 3   QUEtiapine  (SEROQUEL  XR) 300 MG 24 hr tablet, Take 1 tablet (300 mg total) by mouth at bedtime., Disp: 90 tablet, Rfl: 3 Medication Side Effects: none  Family Medical/ Social History: Changes?  Older brother broke his knee cap. Older sister  isn't doing well.  He's had more stress with these family issues.   MENTAL HEALTH EXAM:  There were no vitals taken for this visit.There is no height or weight on file to calculate BMI.  General Appearance: Casual and Well Groomed  Eye Contact:  Good  Speech:  Clear and Coherent and Normal Rate  Volume:  Normal  Mood:  Euthymic  Affect:  Congruent  Thought Process:  Goal Directed and Descriptions of Associations: Intact  Orientation:  Full (Time, Place, and  Person)  Thought Content: Logical   Suicidal Thoughts:  No  Homicidal Thoughts:  No  Memory:  WNL  Judgement:  Good  Insight:  Good  Psychomotor Activity:  Normal   Concentration:  Concentration: Good and Attention Span: Good  Recall:  Good  Fund of Knowledge: Good  Language: Good  Assets:  Desire for Improvement Financial Resources/Insurance Housing Resilience Transportation  ADL's:  Intact  Cognition: WNL  Prognosis:  Good   DIAGNOSES:    ICD-10-CM   1. Bipolar I disorder ([name])  F31.9     2. Generalized anxiety disorder  F41.1      Receiving Psychotherapy: Yes  with Dr. Jodie Kendall  RECOMMENDATIONS:  PDMP reviewed.   Xanax  filled 09/07/2024. I provided approximately  20 minutes of face to face time during this encounter, including time spent before and after the visit in records review, medical decision making, counseling pertinent to today's visit, and charting.   As far as his medications go he is stable so no changes need to be made.  Best wishes for his ill family members.   Continue Xanax  0.5 mg, 1/2-1 p.o. twice daily as needed anxiety. Continue Depakote  DR 500 mg, 3 p.o. nightly. Continue Lexapro  20 mg p.o. daily. Continue Lamictal  100 mg, 1 po bid. Continue Seroquel  XR 300 mg, 1/2 p.o. nightly.  (Listed on the med sheet for him to take 300 mg, only so he will get more for his money.) Continue therapy with Dr. Jodie Kendall. Return in 6 months.  Verneita Cooks, [name]

## 2024-10-16 ENCOUNTER — Other Ambulatory Visit: Payer: Self-pay | Admitting: Nurse Practitioner

## 2024-11-04 ENCOUNTER — Other Ambulatory Visit: Payer: Self-pay

## 2024-11-08 MED ORDER — ATORVASTATIN CALCIUM 80 MG PO TABS: 80.0000 mg | ORAL_TABLET | Freq: Every day | ORAL | 3 refills | Status: AC

## 2024-11-12 ENCOUNTER — Encounter: Payer: Self-pay | Admitting: Gastroenterology

## 2024-11-24 NOTE — Progress Notes (Signed)
 Pharmacy Quality Measure Review  This patient is appearing on a report for being at risk of failing the adherence measure for cholesterol (statin) medications this calendar year.   Medication: Atorvastatin  Last fill date: 11/08/24 for 90 day supply  Insurance report was not up to date. No action needed at this time.   Jon VEAR Lindau, PharmD Clinical Pharmacist [phone]

## 2024-12-02 ENCOUNTER — Ambulatory Visit: Admitting: Psychiatry

## 2024-12-06 ENCOUNTER — Ambulatory Visit: Admitting: Family Medicine

## 2024-12-13 LAB — OPHTHALMOLOGY REPORT-SCANNED

## 2025-01-04 ENCOUNTER — Ambulatory Visit (INDEPENDENT_AMBULATORY_CARE_PROVIDER_SITE_OTHER): Admitting: Psychiatry

## 2025-01-04 ENCOUNTER — Telehealth: Payer: Self-pay

## 2025-01-04 DIAGNOSIS — F902 Attention-deficit hyperactivity disorder, combined type: Secondary | ICD-10-CM | POA: Diagnosis not present

## 2025-01-04 DIAGNOSIS — F319 Bipolar disorder, unspecified: Secondary | ICD-10-CM

## 2025-01-04 DIAGNOSIS — F411 Generalized anxiety disorder: Secondary | ICD-10-CM

## 2025-01-04 DIAGNOSIS — Z636 Dependent relative needing care at home: Secondary | ICD-10-CM | POA: Diagnosis not present

## 2025-01-04 DIAGNOSIS — Z638 Other specified problems related to primary support group: Secondary | ICD-10-CM | POA: Diagnosis not present
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
## 2025-01-10 ENCOUNTER — Ambulatory Visit

## 2025-01-10 VITALS — Ht 67.0 in | Wt 177.0 lb

## 2025-01-10 DIAGNOSIS — Z800 Family history of malignant neoplasm of digestive organs: Secondary | ICD-10-CM

## 2025-01-10 DIAGNOSIS — Z860100 Personal history of colon polyps, unspecified: Secondary | ICD-10-CM

## 2025-01-10 MED ORDER — NA SULFATE-K SULFATE-MG SULF 17.5-3.13-1.6 GM/177ML PO SOLN: 1.0000 | Freq: Once | ORAL | 0 refills | Status: AC

## 2025-01-10 NOTE — Progress Notes (Signed)

## 2025-01-11 ENCOUNTER — Encounter: Payer: Self-pay | Admitting: Gastroenterology

## 2025-01-17 ENCOUNTER — Encounter: Admitting: Gastroenterology

## 2025-01-27 ENCOUNTER — Encounter: Admitting: Gastroenterology

## 2025-03-06 ENCOUNTER — Ambulatory Visit: Admitting: Psychiatry

## 2025-04-13 ENCOUNTER — Ambulatory Visit: Admitting: Physician Assistant
